# Patient Record
Sex: Female | Born: 1935 | Race: White | Hispanic: No | Marital: Married | Smoking: Former smoker
Health system: Southern US, Community
[De-identification: ages and names within clinical notes are randomized; demographics above are authoritative.]

## PROBLEM LIST (undated history)

## (undated) DIAGNOSIS — K219 Gastro-esophageal reflux disease without esophagitis: Secondary | ICD-10-CM

## (undated) DIAGNOSIS — I6529 Occlusion and stenosis of unspecified carotid artery: Secondary | ICD-10-CM

## (undated) DIAGNOSIS — F329 Major depressive disorder, single episode, unspecified: Secondary | ICD-10-CM

## (undated) DIAGNOSIS — E785 Hyperlipidemia, unspecified: Secondary | ICD-10-CM

## (undated) DIAGNOSIS — K635 Polyp of colon: Secondary | ICD-10-CM

## (undated) DIAGNOSIS — R630 Anorexia: Secondary | ICD-10-CM

## (undated) DIAGNOSIS — I7300 Raynaud's syndrome without gangrene: Secondary | ICD-10-CM

## (undated) DIAGNOSIS — F32A Depression, unspecified: Secondary | ICD-10-CM

## (undated) DIAGNOSIS — I10 Essential (primary) hypertension: Secondary | ICD-10-CM

## (undated) DIAGNOSIS — K2970 Gastritis, unspecified, without bleeding: Secondary | ICD-10-CM

## (undated) DIAGNOSIS — G609 Hereditary and idiopathic neuropathy, unspecified: Secondary | ICD-10-CM

## (undated) DIAGNOSIS — K644 Residual hemorrhoidal skin tags: Secondary | ICD-10-CM

## (undated) DIAGNOSIS — I341 Nonrheumatic mitral (valve) prolapse: Secondary | ICD-10-CM

## (undated) DIAGNOSIS — F419 Anxiety disorder, unspecified: Secondary | ICD-10-CM

## (undated) DIAGNOSIS — K5730 Diverticulosis of large intestine without perforation or abscess without bleeding: Secondary | ICD-10-CM

## (undated) DIAGNOSIS — J449 Chronic obstructive pulmonary disease, unspecified: Secondary | ICD-10-CM

## (undated) DIAGNOSIS — R002 Palpitations: Secondary | ICD-10-CM

## (undated) HISTORY — DX: Raynaud's syndrome without gangrene: I73.00

## (undated) HISTORY — DX: Anxiety disorder, unspecified: F41.9

## (undated) HISTORY — DX: Hyperlipidemia, unspecified: E78.5

## (undated) HISTORY — DX: Diverticulosis of large intestine without perforation or abscess without bleeding: K57.30

## (undated) HISTORY — DX: Chronic obstructive pulmonary disease, unspecified: J44.9

## (undated) HISTORY — DX: Occlusion and stenosis of unspecified carotid artery: I65.29

## (undated) HISTORY — DX: Gastro-esophageal reflux disease without esophagitis: K21.9

## (undated) HISTORY — DX: Polyp of colon: K63.5

## (undated) HISTORY — PX: CHOLECYSTECTOMY: SHX55

## (undated) HISTORY — DX: Nonrheumatic mitral (valve) prolapse: I34.1

## (undated) HISTORY — DX: Palpitations: R00.2

## (undated) HISTORY — DX: Major depressive disorder, single episode, unspecified: F32.9

## (undated) HISTORY — DX: Residual hemorrhoidal skin tags: K64.4

## (undated) HISTORY — DX: Hereditary and idiopathic neuropathy, unspecified: G60.9

## (undated) HISTORY — DX: Depression, unspecified: F32.A

## (undated) HISTORY — DX: Essential (primary) hypertension: I10

## (undated) HISTORY — DX: Gastritis, unspecified, without bleeding: K29.70

---

## 1997-12-20 ENCOUNTER — Other Ambulatory Visit: Admission: RE | Admit: 1997-12-20 | Discharge: 1997-12-20 | Payer: Self-pay | Admitting: Cardiology

## 1998-01-14 ENCOUNTER — Other Ambulatory Visit: Admission: RE | Admit: 1998-01-14 | Discharge: 1998-01-14 | Payer: Self-pay | Admitting: *Deleted

## 1998-04-16 DIAGNOSIS — D126 Benign neoplasm of colon, unspecified: Secondary | ICD-10-CM | POA: Insufficient documentation

## 1999-01-31 ENCOUNTER — Other Ambulatory Visit: Admission: RE | Admit: 1999-01-31 | Discharge: 1999-01-31 | Payer: Self-pay | Admitting: *Deleted

## 1999-12-31 ENCOUNTER — Emergency Department (HOSPITAL_COMMUNITY): Admission: EM | Admit: 1999-12-31 | Discharge: 1999-12-31 | Payer: Self-pay | Admitting: Emergency Medicine

## 2000-02-09 ENCOUNTER — Other Ambulatory Visit: Admission: RE | Admit: 2000-02-09 | Discharge: 2000-02-09 | Payer: Self-pay | Admitting: *Deleted

## 2000-03-12 ENCOUNTER — Other Ambulatory Visit: Admission: RE | Admit: 2000-03-12 | Discharge: 2000-03-12 | Payer: Self-pay | Admitting: *Deleted

## 2001-02-03 ENCOUNTER — Encounter (INDEPENDENT_AMBULATORY_CARE_PROVIDER_SITE_OTHER): Payer: Self-pay | Admitting: Gastroenterology

## 2001-03-11 ENCOUNTER — Other Ambulatory Visit: Admission: RE | Admit: 2001-03-11 | Discharge: 2001-03-11 | Payer: Self-pay | Admitting: *Deleted

## 2002-10-13 ENCOUNTER — Encounter: Payer: Self-pay | Admitting: Emergency Medicine

## 2002-10-13 ENCOUNTER — Emergency Department (HOSPITAL_COMMUNITY): Admission: EM | Admit: 2002-10-13 | Discharge: 2002-10-13 | Payer: Self-pay | Admitting: Emergency Medicine

## 2002-11-30 ENCOUNTER — Encounter: Admission: RE | Admit: 2002-11-30 | Discharge: 2003-02-28 | Payer: Self-pay | Admitting: Cardiology

## 2003-05-08 ENCOUNTER — Other Ambulatory Visit: Admission: RE | Admit: 2003-05-08 | Discharge: 2003-05-08 | Payer: Self-pay | Admitting: Obstetrics and Gynecology

## 2004-04-08 ENCOUNTER — Encounter (INDEPENDENT_AMBULATORY_CARE_PROVIDER_SITE_OTHER): Payer: Self-pay | Admitting: Gastroenterology

## 2004-04-08 DIAGNOSIS — K644 Residual hemorrhoidal skin tags: Secondary | ICD-10-CM | POA: Insufficient documentation

## 2004-04-08 DIAGNOSIS — K5730 Diverticulosis of large intestine without perforation or abscess without bleeding: Secondary | ICD-10-CM | POA: Insufficient documentation

## 2005-01-21 ENCOUNTER — Ambulatory Visit: Payer: Self-pay | Admitting: Gastroenterology

## 2005-09-10 ENCOUNTER — Emergency Department (HOSPITAL_COMMUNITY): Admission: EM | Admit: 2005-09-10 | Discharge: 2005-09-10 | Payer: Self-pay | Admitting: Emergency Medicine

## 2006-03-28 ENCOUNTER — Emergency Department (HOSPITAL_COMMUNITY): Admission: EM | Admit: 2006-03-28 | Discharge: 2006-03-28 | Payer: Self-pay | Admitting: *Deleted

## 2006-05-22 ENCOUNTER — Emergency Department (HOSPITAL_COMMUNITY): Admission: EM | Admit: 2006-05-22 | Discharge: 2006-05-22 | Payer: Self-pay | Admitting: Emergency Medicine

## 2007-07-06 ENCOUNTER — Emergency Department (HOSPITAL_COMMUNITY): Admission: EM | Admit: 2007-07-06 | Discharge: 2007-07-06 | Payer: Self-pay | Admitting: Emergency Medicine

## 2007-07-13 ENCOUNTER — Ambulatory Visit: Payer: Self-pay | Admitting: Gastroenterology

## 2007-07-13 LAB — CONVERTED CEMR LAB
BUN: 5 mg/dL — ABNORMAL LOW (ref 6–23)
CO2: 29 meq/L (ref 19–32)
Calcium: 9.9 mg/dL (ref 8.4–10.5)
GFR calc Af Amer: 154 mL/min
GFR calc non Af Amer: 127 mL/min
Glucose, Bld: 96 mg/dL (ref 70–99)
Sodium: 130 meq/L — ABNORMAL LOW (ref 135–145)
TSH: 1.6 microintl units/mL (ref 0.35–5.50)

## 2007-08-03 ENCOUNTER — Ambulatory Visit: Payer: Self-pay | Admitting: Gastroenterology

## 2007-08-03 LAB — CONVERTED CEMR LAB: Potassium: 4.5 meq/L (ref 3.5–5.1)

## 2007-11-10 DIAGNOSIS — J4489 Other specified chronic obstructive pulmonary disease: Secondary | ICD-10-CM | POA: Insufficient documentation

## 2007-11-10 DIAGNOSIS — I10 Essential (primary) hypertension: Secondary | ICD-10-CM

## 2007-11-10 DIAGNOSIS — F329 Major depressive disorder, single episode, unspecified: Secondary | ICD-10-CM

## 2007-11-10 DIAGNOSIS — F411 Generalized anxiety disorder: Secondary | ICD-10-CM | POA: Insufficient documentation

## 2007-11-10 DIAGNOSIS — E785 Hyperlipidemia, unspecified: Secondary | ICD-10-CM

## 2007-11-10 DIAGNOSIS — J449 Chronic obstructive pulmonary disease, unspecified: Secondary | ICD-10-CM

## 2007-11-10 DIAGNOSIS — K2970 Gastritis, unspecified, without bleeding: Secondary | ICD-10-CM | POA: Insufficient documentation

## 2007-11-10 DIAGNOSIS — K219 Gastro-esophageal reflux disease without esophagitis: Secondary | ICD-10-CM | POA: Insufficient documentation

## 2007-11-10 DIAGNOSIS — K2990 Gastroduodenitis, unspecified, without bleeding: Secondary | ICD-10-CM

## 2008-07-12 ENCOUNTER — Ambulatory Visit: Payer: Self-pay | Admitting: Gastroenterology

## 2008-07-12 DIAGNOSIS — R1084 Generalized abdominal pain: Secondary | ICD-10-CM | POA: Insufficient documentation

## 2008-07-12 LAB — CONVERTED CEMR LAB
Alkaline Phosphatase: 78 units/L (ref 39–117)
Basophils Absolute: 0.1 10*3/uL (ref 0.0–0.1)
Basophils Relative: 1 % (ref 0.0–3.0)
Eosinophils Relative: 1.8 % (ref 0.0–5.0)
Ferritin: 146.5 ng/mL (ref 10.0–291.0)
Folate: >20 ng/mL
GFR calc non Af Amer: 86 mL/min
Glucose, Bld: 115 mg/dL — ABNORMAL HIGH (ref 70–99)
Iron: 87 ug/dL (ref 42–145)
MCHC: 34.4 g/dL (ref 30.0–36.0)
Monocytes Relative: 12.4 % — ABNORMAL HIGH (ref 3.0–12.0)
Neutro Abs: 2.8 10*3/uL (ref 1.4–7.7)
Neutrophils Relative %: 54.8 % (ref 43.0–77.0)
Platelets: 250 10*3/uL (ref 150–400)
Potassium: 4.4 meq/L (ref 3.5–5.1)
Saturation Ratios: 24.5 % (ref 20.0–50.0)
TSH: 0.92 microintl units/mL (ref 0.35–5.50)
Total Protein, Serum Electrophoresis: 7.4 g/dL (ref 6.0–8.3)
Total Protein: 7.1 g/dL (ref 6.0–8.3)
Transferrin: 253.9 mg/dL (ref >=212.0)

## 2008-07-13 ENCOUNTER — Encounter: Payer: Self-pay | Admitting: Gastroenterology

## 2008-07-31 ENCOUNTER — Ambulatory Visit: Payer: Self-pay | Admitting: Gastroenterology

## 2010-05-19 ENCOUNTER — Ambulatory Visit: Payer: Self-pay | Admitting: Cardiology

## 2010-05-22 ENCOUNTER — Ambulatory Visit: Payer: Self-pay | Admitting: Cardiology

## 2010-05-22 ENCOUNTER — Ambulatory Visit (HOSPITAL_COMMUNITY): Admission: RE | Admit: 2010-05-22 | Discharge: 2010-05-22 | Payer: Self-pay | Admitting: Cardiology

## 2010-05-22 ENCOUNTER — Encounter: Payer: Self-pay | Admitting: Cardiovascular Disease

## 2010-05-22 ENCOUNTER — Ambulatory Visit: Payer: Self-pay

## 2010-05-22 DIAGNOSIS — H349 Unspecified retinal vascular occlusion: Secondary | ICD-10-CM | POA: Insufficient documentation

## 2010-07-10 ENCOUNTER — Ambulatory Visit: Payer: Self-pay | Admitting: Cardiology

## 2010-09-09 ENCOUNTER — Ambulatory Visit: Payer: Self-pay | Admitting: Cardiology

## 2010-10-21 NOTE — Miscellaneous (Signed)
Summary: Orders Update  Clinical Lists Changes  Problems: Added new problem of UNSPECIFIED RETINAL VASCULAR OCCLUSION (ICD-362.30) Orders: Added new Test order of Carotid Duplex (Carotid Duplex) - Signed

## 2010-12-01 ENCOUNTER — Ambulatory Visit (INDEPENDENT_AMBULATORY_CARE_PROVIDER_SITE_OTHER): Payer: 59 | Admitting: Cardiology

## 2010-12-01 DIAGNOSIS — Z79899 Other long term (current) drug therapy: Secondary | ICD-10-CM

## 2010-12-01 DIAGNOSIS — E7800 Pure hypercholesterolemia, unspecified: Secondary | ICD-10-CM

## 2010-12-01 DIAGNOSIS — I059 Rheumatic mitral valve disease, unspecified: Secondary | ICD-10-CM

## 2010-12-16 ENCOUNTER — Other Ambulatory Visit: Payer: Self-pay | Admitting: Cardiology

## 2010-12-16 DIAGNOSIS — J302 Other seasonal allergic rhinitis: Secondary | ICD-10-CM

## 2010-12-18 ENCOUNTER — Other Ambulatory Visit: Payer: Self-pay | Admitting: Cardiology

## 2010-12-18 DIAGNOSIS — F329 Major depressive disorder, single episode, unspecified: Secondary | ICD-10-CM

## 2010-12-18 MED ORDER — ESCITALOPRAM OXALATE 10 MG PO TABS: 10.0000 mg | ORAL_TABLET | Freq: Every day | ORAL | Status: AC

## 2010-12-18 NOTE — Telephone Encounter (Signed)
escribe request  

## 2010-12-18 NOTE — Telephone Encounter (Signed)
Patient called requesting refill

## 2010-12-18 NOTE — Telephone Encounter (Signed)
[facility] °

## 2010-12-18 NOTE — Telephone Encounter (Signed)
NEEDS REFILL ON LEXAPRO - CALL INTO [facility]- ONLY HAS 3 LEFT

## 2011-02-03 NOTE — Assessment & Plan Note (Signed)
[facility]                         GASTROENTEROLOGY OFFICE NOTE   KIERSTON, PLASENCIA                     MRN:          [id number]  DATE:08/03/2007                            DOB:          [birth year]    ADDRESS:  Cassell Clement, M.D.  [address], [address]  [place], [place] [address]   BODY:   ADDENDUM:  Ms. Stryker's electrolytes were abnormal with what appeared to  be a dilutional hyponatremia. She also was hypokalemic and is on  potassium replacement therapy. Her hydrochlorothiazide 12.5 a day has  been changed to every other day. I have sent her by the lab to repeat  her electrolytes. I have also asked her quit drinking large volumes of  water a day and to try to mix this up with salt-balanced solutions. I  suspect a lot of her symptoms may be related to electrolyte abnormality.  Depending on electrolyte results, we will counsel her further along  these lines.     Vania Rea. Jarold Motto, MD, Caleen Essex, FAGA  Electronically Signed    DRP/MedQ  DD: 08/03/2007  DT: 08/04/2007  Job #: [id number]

## 2011-02-03 NOTE — Assessment & Plan Note (Signed)
[facility]                         GASTROENTEROLOGY OFFICE NOTE   GLENDORA, CLOUATRE                     MRN:          [id number]  DATE:07/13/2007                            DOB:          April 29, 1928    PRIMARY PHYSICIAN:  Cassell Clement, M.D.   HISTORY:  Jenny Torres is a pleasant 77 year old white female, known in the  past to Dr. Ulyess Mort.  She was last seen in May 2006.  She does  have a history of GERD.  She is status post cholecystectomy and has had  alkaline gastritis.  Colonoscopy was done in July 2005.  She had left  sided diverticular disease and external hemorrhoids and last EGD was  done in 2002, which showed the alkaline gastritis.  At this time,  [place] has been suffering with depression which is in part related to  her husband's placement in a nursing home.  She had developed a loss of  appetite, was having difficulty eating and was seen by Dr. Patty Sermons and  started on Lexapro about 5 weeks ago.  She does feel that the Lexapro is  helping her depressive symptoms and her appetite has improved somewhat.  She had lost about 7 pounds but feels that she has leveled off at this  point.  Around that same time, she developed difficulty with nausea  which she says has been intermittent and comes very unexpectedly at all  different times of the day.  She says it comes on fairly suddenly and  sometimes it is just nausea, other times with dry heaves or vomiting.  On occasion she will have a prolonged episode of vomiting.  She did have an ER visit on October 15th, because of several bouts of  nausea and vomiting.  Workup there included a CT of the abdomen and  pelvis which showed the sigmoid diverticular disease, otherwise negative  abdomen.  On chest x-ray she was noted to have a possible nodularity in  the right lung base and recommended 2-view chest.  Labs were  unremarkable save a sodium level of 128 and potassium of 3.2.  She was  discharged with Phenergan suppositories to use p.r.n.  Since that time she did have one other episode of nausea but says that  over the past couple of days she has been feeling better.  She does  describe an increase in indigestion with belching and gassiness, has not  had any real heartburn, no dysphagia, no fever or chills.  Her bowel  movements have been fairly normal without melena or hematochezia.  She  is not on any regular aspirin or NSAIDs.   CURRENT MEDICATIONS:  1. Toprol XL 50 daily.  2. HCTZ 12.5 every other day.  3. Gabapentin 800 b.i.d.  4. Folic acid daily.  5. Multivitamin daily.  6. Os-Cal plus D 500 two p.o. daily.  7. Boniva 150 daily.  8. Ambien 12.5 q.h.s.  9. Lorazepam 1 mg, one half tablet p.r.n.  10.Lexapro 10 daily.   ALLERGIES:  SULFA DRUGS.   PAST HISTORY:  1. Status post cholecystectomy.  2. C-section x2.  3. She has a  history of hypertension.  4. COPD.  5. Hyperlipidemia.  6. Anxiety and depression.   FAMILY HISTORY:  Pertinent for breast cancer in her mother.  One brother  with liver disease.   SOCIAL HISTORY:  The patient is married, currently living alone.  Her  husband is in a nursing home.  She is retired.  She is a nonsmoker,  occasional social alcohol.   REVIEW OF SYSTEMS:  Pertinent for insomnia and depressive symptoms as  outlined above.  Otherwise completely negative.   PHYSICAL EXAMINATION:  GENERAL:  A well developed, thin, white female in  no acute distress.  Alert and oriented.  Pleasant.  VITAL SIGNS:  Height is 5 feet 4 inches, weight is 99.2, blood pressure  130/80, pulse is 62.  HEENT:  Nontraumatic, normocephalic.  EOMI.  PERLA.  Sclerae anicteric.  NECK:  Supple.  CARDIOVASCULAR:  Regular rate and rhythm with S1 and S2.  No murmur,  rub, or gallop.  PULMONARY:  Clear to A&P.  ABDOMEN:  Soft and nontender.  There is no palpable mass or  hepatosplenomegaly.  RECTAL:  Hemoccult negative and without mass.    IMPRESSION:  73. A 77 year old white female with a 5-week history of intermittent      nausea, suspect that this may be a side effect of the Lexapro as      temporally it started around the same time she began the Lexapro,      rule out alkaline gastritis, and non-ulcer dyspepsia.  2. Weight loss, anorexia felt secondary to depression, currently      improved on Lexapro.  3. Mild hyponatremia and hypokalemia.   PLAN:  1. Check B-MET today for follow up the hypokalemia and hyponatremia      and ensure no further decline.  2. Trial of Nexium 40 mg p.o. q.a.m.  She was given samples and a      prescription today.  3. Institute a bland diet with small frequent feedings.  4. Trial of Zofran oral disintegrating tablet, 4 mg q.6 h. p.r.n.      nausea.  5. Follow up with Dr. Jarold Motto in 2-3 weeks.  If she is not improved      at that point will likely need EGD.      Mike Gip, PA-C  Electronically Signed      Barbette Hair. Arlyce Dice, MD,FACG  Electronically Signed   AE/MedQ  DD: 07/13/2007  DT: 07/14/2007  Job #: [id number]

## 2011-02-03 NOTE — Assessment & Plan Note (Signed)
[facility]                         GASTROENTEROLOGY OFFICE NOTE   Jenny Torres, Jenny Torres                     MRN:          [id number]  DATE:08/03/2007                            DOB:          January 09, 1928    The patient returns for follow-up of office visit July 13, 2007, when  she saw Mike Gip, PA-C for evaluation of nausea.  She was  empirically given a trial of Nexium 40 mg a day which made her symptoms  worse.  Since stopping her Lexapro she is entirely asymptomatic and  denies any GI complaints whatsoever.  Her appetite is improved, but she  still lost another 7-8 pounds of weight which is of some concern.   She has her primary care through Cassell Clement, M.D. and I have not  given her another antidepressant since I do not know the patient as well  as Dr. Patty Sermons.  If she continues to do poorly in terms of her  clinical course, psychiatric referral would certainly seem in order.  She has had a rather thorough previous GI workup by Ulyess Mort,  M.D. which was unremarkable.   ADDENDUM:  [name]'s electrolytes were abnormal with what appeared to  be a dilutional hyponatremia. She also was hypokalemic and is on  potassium replacement therapy. Her hydrochlorothiazide 12.5 a day has  been changed to every other day. I have sent her by the lab to repeat  her electrolytes. I have also asked her quit drinking large volumes of  water a day and to try to mix this up with salt-balanced solutions. I  suspect a lot of her symptoms may be related to electrolyte abnormality.  Depending on electrolyte results, we will counsel her further along  these lines.     Jenny Rea. Jarold Motto, MD, Caleen Essex, FAGA  Electronically Signed    DRP/MedQ  DD: 08/03/2007  DT: 08/04/2007  Job #: [id number]   cc:   Cassell Clement, M.D.

## 2011-03-19 ENCOUNTER — Encounter: Payer: Self-pay | Admitting: Podiatry

## 2011-03-23 ENCOUNTER — Encounter: Payer: Self-pay | Admitting: Cardiology

## 2011-03-27 ENCOUNTER — Telehealth: Payer: Self-pay | Admitting: Cardiology

## 2011-03-27 NOTE — Telephone Encounter (Signed)
Patient c/o lower back pain all week.  Denies any urinary symptoms.  Doesn't think it feels like a bone issue or that she injured it.  Advised  Dr. Patty Sermons out of the office today and to go to urgent care.  Patient states does use ibuprofen at bedtime only.  Advised to go ahead and to take some now and to try to get to urgent care today.  Verbalized understanding

## 2011-03-27 NOTE — Telephone Encounter (Signed)
Pt says she has been having lower back pain for about a week please call

## 2011-04-01 ENCOUNTER — Other Ambulatory Visit: Payer: Self-pay | Admitting: *Deleted

## 2011-04-01 DIAGNOSIS — E785 Hyperlipidemia, unspecified: Secondary | ICD-10-CM

## 2011-04-20 ENCOUNTER — Ambulatory Visit (INDEPENDENT_AMBULATORY_CARE_PROVIDER_SITE_OTHER): Payer: Medicare Other | Admitting: *Deleted

## 2011-04-20 ENCOUNTER — Other Ambulatory Visit: Payer: 59 | Admitting: *Deleted

## 2011-04-20 DIAGNOSIS — E785 Hyperlipidemia, unspecified: Secondary | ICD-10-CM

## 2011-04-20 LAB — BASIC METABOLIC PANEL
BUN: 7 mg/dL (ref 6–23)
CO2: 29 mEq/L (ref 19–32)
Calcium: 9 mg/dL (ref 8.4–10.5)
Chloride: 101 mEq/L (ref 96–112)
Glucose, Bld: 77 mg/dL (ref 70–99)
Potassium: 4.1 mEq/L (ref 3.5–5.1)
Sodium: 134 mEq/L — ABNORMAL LOW (ref 135–145)

## 2011-04-20 LAB — HEPATIC FUNCTION PANEL
Albumin: 4.3 g/dL (ref 3.5–5.2)
Alkaline Phosphatase: 81 U/L (ref 39–117)
Total Protein: 6.8 g/dL (ref 6.0–8.3)

## 2011-04-20 LAB — LIPID PANEL
Cholesterol: 183 mg/dL (ref 0–200)
Triglycerides: 64 mg/dL (ref 0.0–149.0)
VLDL: 12.8 mg/dL (ref 0.0–40.0)

## 2011-04-24 ENCOUNTER — Ambulatory Visit (INDEPENDENT_AMBULATORY_CARE_PROVIDER_SITE_OTHER): Payer: Medicare Other | Admitting: Cardiology

## 2011-04-24 ENCOUNTER — Encounter: Payer: Self-pay | Admitting: Cardiology

## 2011-04-24 ENCOUNTER — Other Ambulatory Visit: Payer: Self-pay | Admitting: *Deleted

## 2011-04-24 VITALS — BP 130/85 | HR 80 | Wt 98.0 lb

## 2011-04-24 DIAGNOSIS — I7300 Raynaud's syndrome without gangrene: Secondary | ICD-10-CM

## 2011-04-24 DIAGNOSIS — I2510 Atherosclerotic heart disease of native coronary artery without angina pectoris: Secondary | ICD-10-CM

## 2011-04-24 DIAGNOSIS — R002 Palpitations: Secondary | ICD-10-CM

## 2011-04-24 DIAGNOSIS — E785 Hyperlipidemia, unspecified: Secondary | ICD-10-CM

## 2011-04-24 DIAGNOSIS — I10 Essential (primary) hypertension: Secondary | ICD-10-CM

## 2011-04-24 DIAGNOSIS — F329 Major depressive disorder, single episode, unspecified: Secondary | ICD-10-CM

## 2011-04-24 MED ORDER — AMLODIPINE BESYLATE 5 MG PO TABS: 5.0000 mg | ORAL_TABLET | Freq: Every day | ORAL | Status: DC

## 2011-04-24 MED ORDER — FOLIC ACID 1 MG PO TABS: 1.0000 mg | ORAL_TABLET | Freq: Every day | ORAL | Status: DC

## 2011-04-24 MED ORDER — METOPROLOL TARTRATE 25 MG PO TABS: 25.0000 mg | ORAL_TABLET | Freq: Every day | ORAL | Status: DC

## 2011-04-24 NOTE — Telephone Encounter (Signed)
Refilled folic acid to [facility]

## 2011-04-24 NOTE — Progress Notes (Signed)
Jenny Torres Date of Birth:  [birth year] [facility] [address]   [address] [place], [place]  [address] [phone]           Fax   [phone]  History of Present Illness: This pleasant 77 year old woman is seen for a scheduled followup office visit.  She has a complex past medical history.  She has a history of mitral valve prolapse and history of palpitations.  She also has a history of seasonal affective disorder and depression and is on Lexapro.  She does not have any history of ischemic heart disease.  He has had some atypical chest pain but she had a normal nuclear stress test on 11/25/07 which showed no ischemia and an ejection fraction of 73%.  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  She is on daily aspirin.  She has a history of hypercholesterolemia and is intolerant of statins.  Recently we tried again putting her on Zetia and this time she is able to take it okay.  Her lipids this time are significantly improved since we last saw her the patient has developed worsening Raynaud's phenomenon of her hands and her feet.  It is particularly troublesome when she goes to the grocery store and is exposed to cold air.  The patient has been under a lot of stress.  Her husband has  advanced dementiaAnd does not recognize her or the children anymore.  Her husband has a pacemaker but is not pacemaker dependent and I have supported the patient's feeling that the pacemaker should not be replaced in his situation.  Her children however have been giving her a lot of grief about that decision.  The children however are not involved in the husband's day-to-day care either.  Current Outpatient Prescriptions  Medication Sig Dispense Refill  . aspirin 81 MG tablet Take 81 mg by mouth daily.        . calcium-vitamin D (OSCAL 500/200 D-3) 500-200  MG-UNIT per tablet Take 2 tablets by mouth daily.        . fluocinonide (LIDEX) 0.05 % cream Apply topically 2 (two) times daily. As directed by dermatologist       . fluticasone (FLONASE) 50 MCG/ACT nasal spray AS DIRECTED  16 g  5  . gabapentin (NEURONTIN) 800 MG tablet Take 800 mg by mouth 2 (two) times daily. Taking 2 or 3 times a day      . Multiple Vitamins-Minerals (MULTIVITAL) tablet Take 1 tablet by mouth daily.        . ondansetron (ZOFRAN ODT) 4 MG disintegrating tablet Take 4 mg by mouth as needed.        . Psyllium (METAMUCIL) 30.9 % POWD Take by mouth daily.        Marland Kitchen zolpidem (AMBIEN CR) 12.5 MG CR tablet Take 12.5 mg by mouth at bedtime.        Marland Kitchen amLODipine (NORVASC) 5 MG tablet Take 1 tablet (5 mg total) by mouth daily.  90 tablet  3  . folic acid (FOLVITE) 1 MG tablet Take 1 tablet (1 mg total) by mouth daily.  30 tablet  11  . metoprolol tartrate (LOPRESSOR) 25 MG tablet Take 1 tablet (25 mg total) by mouth daily.  90 tablet  3    Allergies  Allergen Reactions  . Sulfamethoxazole     REACTION: unspecified  Patient Active Problem List  Diagnoses  . POLYP, COLON  . HYPERLIPIDEMIA  . ANXIETY  . DEPRESSION  . UNSPECIFIED RETINAL VASCULAR OCCLUSION  . HYPERTENSION  . EXTERNAL HEMORRHOIDS  . COPD  . GASTROESOPHAGEAL REFLUX DISEASE  . GASTRITIS  . DIVERTICULOSIS, COLON  . ABDOMINAL PAIN, GENERALIZED    History  Smoking status  . Never Smoker   Smokeless tobacco  . Not on file    History  Alcohol Use  . Yes    Family History  Problem Relation Age of Onset  . Breast cancer Mother   . Liver disease Brother   . Cirrhosis Brother     Review of Systems: Constitutional: no fever chills diaphoresis or fatigue or change in weight.  Head and neck: no hearing loss, no epistaxis, no photophobia or visual disturbance. Respiratory: No cough, shortness of breath or wheezing. Cardiovascular: No chest pain peripheral edema, palpitations. Gastrointestinal: No  abdominal distention, no abdominal pain, no change in bowel habits hematochezia or melena. Genitourinary: No dysuria, no frequency, no urgency, no nocturia. Musculoskeletal:No arthralgias, no back pain, no gait disturbance or myalgias. Neurological: No dizziness, no headaches, no numbness, no seizures, no syncope, no weakness, no tremors.  She does have severe peripheral neuropathy of her lower extremities secondary to previous statin therapy Hematologic: No lymphadenopathy, no easy bruising. Psychiatric: No confusion, no hallucinations, no sleep disturbance.    Physical Exam: Filed Vitals:   04/24/11 1602  BP: 130/85  Pulse: 80   the general appearance reveals a thin elderly woman in no acute distress.The pupils are equal round reactive to light and accommodation.  There is no lymphadenopathy.  The thyroid is not enlarged.  There are no carotid bruits.The chest is clear to percussion and auscultation.  The heart reveals no murmur gallop rub or click.  The abdomen is soft and nontender without hepatosplenomegaly or masses.  Extremities show no phlebitis or edema.  Pedal pulses are present.the skin is unremarkable      Assessment / Plan:   continue same medication except decreased metoprolol to 25 mg once a day and add amlodipine 5 mg once a day to help with Raynaud's symptoms.  Recheck in 4 months for followup office visit lipid panel and chemistries.

## 2011-04-24 NOTE — Assessment & Plan Note (Signed)
The patient is tolerating the Zetia.  Her most recent lipid numbers are quite good

## 2011-04-24 NOTE — Assessment & Plan Note (Signed)
The patient is not having any chest pain or increased palpitations.  She denies any increased dyspnea.  She's had no dizziness or syncope.

## 2011-04-24 NOTE — Assessment & Plan Note (Signed)
The patient has a history of anxiety and depression.  She is on Lexapro 20 mg and also takes an occasional half tablet of lorazepam for stress.  She has a poor appetite and lack of interest in food and she has lost another 4 pounds.  I told her that I wanted her to gain back some weight and she needs to start drinking Ensure or boost or whatever type of protein drink appeals to her most

## 2011-04-30 ENCOUNTER — Telehealth: Payer: Self-pay | Admitting: *Deleted

## 2011-04-30 DIAGNOSIS — G4700 Insomnia, unspecified: Secondary | ICD-10-CM

## 2011-04-30 NOTE — Telephone Encounter (Signed)
Patient came by stating she is under a lot of stress and having a very difficult time sleeping.  She has been taking generic Ambien at bedtime, but it is no longer working.  Discussed with  Dr. Patty Sermons and will try Temazepam 30 mg at bedtime as needed.  Advised patient and she is to call back if this does not help.  Also advised to not take the Ambien and Temazepam together.  Verbalized understanding.

## 2011-05-01 ENCOUNTER — Encounter: Payer: Self-pay | Admitting: Cardiology

## 2011-05-01 ENCOUNTER — Telehealth: Payer: Self-pay | Admitting: Cardiology

## 2011-05-01 DIAGNOSIS — M792 Neuralgia and neuritis, unspecified: Secondary | ICD-10-CM

## 2011-05-01 MED ORDER — GABAPENTIN 800 MG PO TABS: ORAL_TABLET | ORAL | Status: DC

## 2011-05-01 NOTE — Telephone Encounter (Signed)
Advised patient

## 2011-05-01 NOTE — Telephone Encounter (Signed)
Pt was here the other day and forgot to get renewal of gabapentin tab 800 mg 3 a day please call

## 2011-05-05 MED ORDER — TEMAZEPAM 30 MG PO CAPS: 30.0000 mg | ORAL_CAPSULE | Freq: Every evening | ORAL | Status: AC | PRN

## 2011-05-28 ENCOUNTER — Telehealth: Payer: Self-pay | Admitting: Cardiology

## 2011-05-28 DIAGNOSIS — R002 Palpitations: Secondary | ICD-10-CM

## 2011-05-28 DIAGNOSIS — I7300 Raynaud's syndrome without gangrene: Secondary | ICD-10-CM

## 2011-05-28 DIAGNOSIS — G4700 Insomnia, unspecified: Secondary | ICD-10-CM

## 2011-05-28 NOTE — Telephone Encounter (Signed)
Patient phoned stating that  Dr. Patty Sermons had changed her metoprolol to one daily and added amlodipine secondary to her Raynauld's symptoms.  She has not noticed a difference and had a strange taste in her mouth.  She change back to her metoprolol twice daily and discontinued her amlodipine. Advised ok to continue her previous regimen.  Does not want to make changes there.  She also is still having sleep issues.  States the temazepam 30 mg at bedtime helps her sleep however, she feels very tired the next day.  Does not even feel comfortable driving.  The ambien 10 mg helps, but for only  4 hours.  Temazepam is a capsule so she is not able to cut in half.  Please advise

## 2011-05-28 NOTE — Telephone Encounter (Signed)
Pt calling regarding medication side effects and desire to modify RX's and/or dosages.

## 2011-05-30 NOTE — Telephone Encounter (Signed)
Try reducing temazepam to 15 mg at bedtime to lessen the side effects of sleepiness the next day

## 2011-06-01 MED ORDER — TEMAZEPAM 15 MG PO CAPS: 15.0000 mg | ORAL_CAPSULE | Freq: Every evening | ORAL | Status: AC | PRN

## 2011-06-01 NOTE — Telephone Encounter (Signed)
Left message

## 2011-06-01 NOTE — Telephone Encounter (Signed)
Agree with plan 

## 2011-06-01 NOTE — Telephone Encounter (Signed)
Advised of decrease temazepam to  15 mg hs, called to [facility], only going to start with 1 week supply first

## 2011-07-01 LAB — I-STAT 8, (EC8 V) (CONVERTED LAB)
Bicarbonate: 21.3
Operator id: 279831
Potassium: 3.2 — ABNORMAL LOW
TCO2: 22
pCO2, Ven: 32.7 — ABNORMAL LOW
pH, Ven: 7.422 — ABNORMAL HIGH

## 2011-07-01 LAB — DIFFERENTIAL
Basophils Absolute: 0
Basophils Relative: 0
Eosinophils Absolute: 0
Lymphocytes Relative: 24
Neutro Abs: 4.4
Neutrophils Relative %: 66

## 2011-07-01 LAB — CBC
MCHC: 35
Platelets: 258
RDW: 12.8

## 2011-07-07 ENCOUNTER — Telehealth: Payer: Self-pay | Admitting: Cardiology

## 2011-07-07 NOTE — Telephone Encounter (Signed)
Patient called about refill request from [facility] on her generic ambien.  She does get more rest with the temazepam 30 mg however it makes her feel very tired the next day.  Does have the 15 mg they she was given to see if this would help without the fatigue the next day.  The generic Remus Loffler she only gets about 4 hours of sleep a night.  Is going to try the temazepam 15 mg for a few nights and let us know which one works better.  Will call us back and let us know what to refill

## 2011-07-07 NOTE — Telephone Encounter (Signed)
Pt called wanted to talk to you about a prescription please call after 4pm

## 2011-07-07 NOTE — Telephone Encounter (Signed)
Agree with plan 

## 2011-07-08 ENCOUNTER — Telehealth: Payer: Self-pay | Admitting: Cardiology

## 2011-07-08 NOTE — Telephone Encounter (Signed)
Pt needs refill of Ambiem and they have faxed this several times with no reply.  Medicine not listed on med list so could not be sent to refill desk.

## 2011-07-08 NOTE — Telephone Encounter (Signed)
Spoke with Mickeal Skinner and explained Ambien only helps for about 4 hours, so she is going to try Temazepam 15 mg.  She will call back and let us know which one she prefers.  Will call Leonie Douglas back with a refill ok

## 2011-07-10 ENCOUNTER — Telehealth: Payer: Self-pay | Admitting: Cardiology

## 2011-07-10 NOTE — Telephone Encounter (Signed)
Pt calling to speak with [facility] regarding pt dosages. Please return pt call to discuss further.

## 2011-07-10 NOTE — Telephone Encounter (Signed)
Pt states she thinks the Temazepam is working ok and would like to try it for another month.

## 2011-07-22 ENCOUNTER — Telehealth: Payer: Self-pay | Admitting: Cardiology

## 2011-07-22 DIAGNOSIS — G4700 Insomnia, unspecified: Secondary | ICD-10-CM

## 2011-07-22 NOTE — Telephone Encounter (Signed)
We can try the ambien CR 12.5 qHS

## 2011-07-22 NOTE — Telephone Encounter (Signed)
Has been trying the Restoril 15 mg since 30 mg made her kind of groggy next day.  Restoril 15 mg didn't work very well and would like to go back to Ambien 10 mg.  States with Ambien 10 mg she only sleeps for 4 hours.  States she has never tried the Ambien CR, do you think this would be good for her to try? If so will call to [facility]

## 2011-07-22 NOTE — Telephone Encounter (Signed)
Pt needs to talk to you about medication she talk to you about before

## 2011-07-24 MED ORDER — METOPROLOL TARTRATE 25 MG PO TABS: 25.0000 mg | ORAL_TABLET | Freq: Two times a day (BID) | ORAL | Status: DC

## 2011-07-24 NOTE — Telephone Encounter (Signed)
Advised patient

## 2011-08-07 NOTE — Telephone Encounter (Signed)
New problem:  Patient start on new medication - zolpidem 12.5 mg. Working fine. 90 day supply called in to [facility].

## 2011-08-10 NOTE — Telephone Encounter (Signed)
Will fax signed order to [facility]

## 2011-08-19 MED ORDER — ZOLPIDEM TARTRATE ER 12.5 MG PO TBCR: 12.5000 mg | EXTENDED_RELEASE_TABLET | Freq: Every day | ORAL | Status: DC

## 2011-09-09 ENCOUNTER — Other Ambulatory Visit: Payer: Self-pay | Admitting: *Deleted

## 2011-09-09 ENCOUNTER — Encounter: Payer: Self-pay | Admitting: *Deleted

## 2011-09-09 ENCOUNTER — Other Ambulatory Visit (INDEPENDENT_AMBULATORY_CARE_PROVIDER_SITE_OTHER): Payer: 59 | Admitting: *Deleted

## 2011-09-09 DIAGNOSIS — G609 Hereditary and idiopathic neuropathy, unspecified: Secondary | ICD-10-CM | POA: Insufficient documentation

## 2011-09-09 DIAGNOSIS — E785 Hyperlipidemia, unspecified: Secondary | ICD-10-CM

## 2011-09-09 DIAGNOSIS — I341 Nonrheumatic mitral (valve) prolapse: Secondary | ICD-10-CM | POA: Insufficient documentation

## 2011-09-09 DIAGNOSIS — R002 Palpitations: Secondary | ICD-10-CM | POA: Insufficient documentation

## 2011-09-09 DIAGNOSIS — M810 Age-related osteoporosis without current pathological fracture: Secondary | ICD-10-CM | POA: Insufficient documentation

## 2011-09-09 LAB — BASIC METABOLIC PANEL
CO2: 29 mEq/L (ref 19–32)
Chloride: 100 mEq/L (ref 96–112)
Potassium: 3.9 mEq/L (ref 3.5–5.1)

## 2011-09-09 LAB — LIPID PANEL
Cholesterol: 198 mg/dL (ref 0–200)
LDL Cholesterol: 97 mg/dL (ref 0–99)
Total CHOL/HDL Ratio: 2
Triglycerides: 66 mg/dL (ref 0.0–149.0)
VLDL: 13.2 mg/dL (ref 0.0–40.0)

## 2011-09-09 LAB — HEPATIC FUNCTION PANEL
Albumin: 4.1 g/dL (ref 3.5–5.2)
Alkaline Phosphatase: 94 U/L (ref 39–117)
Total Protein: 6.9 g/dL (ref 6.0–8.3)

## 2011-09-11 ENCOUNTER — Ambulatory Visit (INDEPENDENT_AMBULATORY_CARE_PROVIDER_SITE_OTHER): Payer: Medicare Other | Admitting: Cardiology

## 2011-09-11 ENCOUNTER — Encounter: Payer: Self-pay | Admitting: Cardiology

## 2011-09-11 DIAGNOSIS — F411 Generalized anxiety disorder: Secondary | ICD-10-CM

## 2011-09-11 DIAGNOSIS — E7800 Pure hypercholesterolemia, unspecified: Secondary | ICD-10-CM

## 2011-09-11 DIAGNOSIS — G609 Hereditary and idiopathic neuropathy, unspecified: Secondary | ICD-10-CM

## 2011-09-11 DIAGNOSIS — I341 Nonrheumatic mitral (valve) prolapse: Secondary | ICD-10-CM

## 2011-09-11 DIAGNOSIS — I059 Rheumatic mitral valve disease, unspecified: Secondary | ICD-10-CM

## 2011-09-11 DIAGNOSIS — G629 Polyneuropathy, unspecified: Secondary | ICD-10-CM

## 2011-09-11 DIAGNOSIS — I7300 Raynaud's syndrome without gangrene: Secondary | ICD-10-CM

## 2011-09-11 DIAGNOSIS — E785 Hyperlipidemia, unspecified: Secondary | ICD-10-CM

## 2011-09-11 NOTE — Assessment & Plan Note (Signed)
The patient has had problems with unintentional weight loss.  For this reason we've had to be more liberal with her heart healthy diet to encourage weight gain.  She has gained 2 pounds since last visit.  Despite the weight gain and the more liberal diet her lipids remain satisfactory.  She is on Zetia which she is tolerating.

## 2011-09-11 NOTE — Assessment & Plan Note (Signed)
The patient has not been experiencing any recent palpitations.  Denies any chest pain or angina.  She has had no recent nausea.

## 2011-09-11 NOTE — Progress Notes (Signed)
Jenny Torres Date of Birth:  09/06/1928 [facility] / [facility] [address]   [address] [place], [place]  [address] [phone]           Fax   [phone]  History of Present Illness: This pleasant 77 year old female is seen for a scheduled followup office visit.  She has a past history of hypercholesterolemia and history of essential hypertension.  She also has a history of Raynaud's disease.  She's had a history of palpitations and history of situational depression.  She has a history of suspected mitral valve prolapse.  His last visit she's been doing for her.  She's had no new cardiac symptoms.  She is under a lot of ongoing stress because of her husband who has severe dementia and is in a nursing home.  Current Outpatient Prescriptions  Medication Sig Dispense Refill  . aspirin 81 MG tablet Take 81 mg by mouth daily.        . calcium-vitamin D (OSCAL 500/200 D-3) 500-200 MG-UNIT per tablet Take 2 tablets by mouth daily.        Marland Kitchen ezetimibe (ZETIA) 10 MG tablet Take 10 mg by mouth daily.        . fluocinonide (LIDEX) 0.05 % cream Apply topically 2 (two) times daily. As directed by dermatologist       . fluticasone (FLONASE) 50 MCG/ACT nasal spray AS DIRECTED  16 g  5  . gabapentin (NEURONTIN) 800 MG tablet Three times a day as needed  270 tablet  1  . metoprolol tartrate (LOPRESSOR) 25 MG tablet Take 1 tablet (25 mg total) by mouth 2 (two) times daily.  180 tablet  3  . Multiple Vitamins-Minerals (MULTIVITAL) tablet Take 1 tablet by mouth daily.        . ondansetron (ZOFRAN ODT) 4 MG disintegrating tablet Take 4 mg by mouth as needed.        . Psyllium (METAMUCIL) 30.9 % POWD Take by mouth daily.        Marland Kitchen zolpidem (AMBIEN CR) 12.5 MG CR tablet Take 1 tablet (12.5 mg total) by mouth at bedtime.  90 tablet  1    Allergies  Allergen Reactions  . Ceftin     GI problems  . Darvocet (Propoxyphene N-Acetaminophen)   . Erythromycin     Upset stomach  . Mevacor     . Paxil     jittery  . Penicillins   . Sulfamethoxazole     REACTION: unspecified  . Theophyllines   . Tussionex Pennkinetic Er     nausea    Patient Active Problem List  Diagnoses  . POLYP, COLON  . HYPERLIPIDEMIA  . ANXIETY  . DEPRESSION  . UNSPECIFIED RETINAL VASCULAR OCCLUSION  . HYPERTENSION  . EXTERNAL HEMORRHOIDS  . COPD  . GASTROESOPHAGEAL REFLUX DISEASE  . GASTRITIS  . DIVERTICULOSIS, COLON  . ABDOMINAL PAIN, GENERALIZED  . Raynaud's disease /phenomenon  . Idiopathic peripheral neuropathy  . MVP (mitral valve prolapse)  . Palpitations  . Osteoporosis    History  Smoking status  . Former Smoker  Smokeless tobacco  . Not on file    History  Alcohol Use  . Yes    Family History  Problem Relation Age of Onset  . Breast cancer Mother   . Liver disease Brother   . Cirrhosis Brother     Review of Systems: Constitutional: no fever chills diaphoresis or fatigue or change in weight.  Head and neck: no hearing  loss, no epistaxis, no photophobia or visual disturbance. Respiratory: No cough, shortness of breath or wheezing. Cardiovascular: No chest pain peripheral edema, palpitations. Gastrointestinal: No abdominal distention, no abdominal pain, no change in bowel habits hematochezia or melena. Genitourinary: No dysuria, no frequency, no urgency, no nocturia. Musculoskeletal:No arthralgias, no back pain, no gait disturbance or myalgias. Neurological: No dizziness, no headaches, no numbness, no seizures, no syncope, no weakness, no tremors. Hematologic: No lymphadenopathy, no easy bruising. Psychiatric: No confusion, no hallucinations, no sleep disturbance.    Physical Exam: Filed Vitals:   09/11/11 1412  BP: 110/78  Pulse: 66   the general appearance reveals a thin woman in no acute distress.Pupils equal and reactive.   Extraocular Movements are full.  There is no scleral icterus.  The mouth and pharynx are normal.  The neck is supple.  The carotids  reveal no bruits.  The jugular venous pressure is normal.  The thyroid is not enlarged.  There is no lymphadenopathy.  The chest is clear to percussion and auscultation. There are no rales or rhonchi. Expansion of the chest is symmetrical.  Heart reveals a soft systolic murmur at the apex.  There is no gallop or rub.The abdomen is soft and nontender. Bowel sounds are normal. The liver and spleen are not enlarged. There Are no abdominal masses. There are no bruits.  The pedal pulses are good.  There is no phlebitis or edema.  There is no cyanosis or clubbing.  Extremities are cool to the touch but she does have adequate pedal pulses.Strength is normal and symmetrical in all extremities.  There is no lateralizing weakness.  There are no sensory deficits.  She does have decreased sensation in the lower extremities consistent with her idiopathic peripheral neuropathy  The skin is warm and dry.  There is no rash.     Assessment / Plan:  Continue present medication.  Recheck in 4 months for followup office visit lipid panel hepatic function panel and basal metabolic panel.  Continue to be sure she does not lose weight by eating an adequate amount of calories

## 2011-09-11 NOTE — Assessment & Plan Note (Signed)
The patient has a history of asthma anxiety and depression.  She has difficulty with insomnia as a result of this and does take Ambien CR which has been effective and she is not having any side effects.

## 2011-09-11 NOTE — Patient Instructions (Signed)
Your physician recommends that you continue on your current medications as directed. Please refer to the Current Medication list given to you today. Your physician wants you to follow-up in: 4 month You will receive a reminder letter in the mail two months in advance. If you don't receive a letter, please call our office to schedule the follow-up appointment.  

## 2011-09-23 ENCOUNTER — Telehealth: Payer: Self-pay | Admitting: Cardiology

## 2011-09-23 DIAGNOSIS — F329 Major depressive disorder, single episode, unspecified: Secondary | ICD-10-CM

## 2011-09-23 DIAGNOSIS — R110 Nausea: Secondary | ICD-10-CM

## 2011-09-23 NOTE — Telephone Encounter (Signed)
New Problem:     Patient called wanting to know what she could expect from Dr. Patty Sermons in his change form doing primary care. Patient said this is not an emergency and to feel free to call tomorrow.

## 2011-09-24 ENCOUNTER — Encounter: Payer: Self-pay | Admitting: Cardiology

## 2011-09-24 NOTE — Telephone Encounter (Signed)
Takes Lexapro in the winter, gets depressed during that time.  Has started back on it, but it gives it an awful taste in her mouth.  She would like something else, Paxil makes her jittery.  Is it ok to refill Zofran.

## 2011-09-24 NOTE — Telephone Encounter (Signed)
For depression can try switch to Zoloft 50 mg daily. OK to refill Zofran.

## 2011-09-25 MED ORDER — ONDANSETRON 8 MG PO TBDP: 8.0000 mg | ORAL_TABLET | ORAL | Status: DC | PRN

## 2011-09-25 MED ORDER — SERTRALINE HCL 50 MG PO TABS: 50.0000 mg | ORAL_TABLET | Freq: Every day | ORAL | Status: DC

## 2011-09-25 NOTE — Telephone Encounter (Signed)
Advised patient

## 2011-10-02 ENCOUNTER — Telehealth: Payer: Self-pay | Admitting: Cardiology

## 2011-10-02 NOTE — Telephone Encounter (Signed)
New msg °Patient returning your call ° °

## 2011-10-16 ENCOUNTER — Encounter: Payer: Self-pay | Admitting: Cardiology

## 2011-10-16 ENCOUNTER — Other Ambulatory Visit: Payer: Self-pay | Admitting: Cardiology

## 2011-10-16 DIAGNOSIS — F419 Anxiety disorder, unspecified: Secondary | ICD-10-CM

## 2011-10-16 MED ORDER — LORAZEPAM 1 MG PO TABS: 1.0000 mg | ORAL_TABLET | Freq: Two times a day (BID) | ORAL | Status: AC | PRN

## 2011-10-16 NOTE — Telephone Encounter (Signed)
New Msg: pt calling wanting to speak nurse regarding medication, Zoloft,  pt stopped taking on her on. Pt said medication was making her anorexic and nauseas. Pt said she had only taken medication for 10 days.  Please return pt call to discuss further.

## 2011-10-16 NOTE — Telephone Encounter (Signed)
Called to pharmacy 

## 2011-10-16 NOTE — Telephone Encounter (Signed)
Couldn't even stand sight of food.  Does have Rx for Lorazepam 1 mg that she takes 1/2 of it as needed, ( Rx says 1 twice daily as needed) got it last March.  Ok to fill?

## 2011-10-16 NOTE — Telephone Encounter (Signed)
Yes, okay to fill

## 2011-10-16 NOTE — Telephone Encounter (Signed)
Refilled zetia.

## 2011-12-16 ENCOUNTER — Other Ambulatory Visit: Payer: Self-pay | Admitting: *Deleted

## 2011-12-16 ENCOUNTER — Telehealth: Payer: Self-pay | Admitting: Cardiology

## 2011-12-16 MED ORDER — AZITHROMYCIN 250 MG PO TABS: ORAL_TABLET | ORAL | Status: AC

## 2011-12-16 MED ORDER — GUAIFENESIN ER 600 MG PO TB12: 600.0000 mg | ORAL_TABLET | Freq: Two times a day (BID) | ORAL | Status: DC

## 2011-12-16 NOTE — Telephone Encounter (Signed)
Please return patient call at hm# [phone]   Patient would like to discuss medications, she can be reached at hm# [phone]

## 2011-12-16 NOTE — Telephone Encounter (Signed)
Z-pak, mucinex and tussionex called into drug store for pt.

## 2011-12-19 ENCOUNTER — Ambulatory Visit: Payer: Medicare Other

## 2011-12-19 ENCOUNTER — Ambulatory Visit (INDEPENDENT_AMBULATORY_CARE_PROVIDER_SITE_OTHER): Payer: Medicare Other | Admitting: Family Medicine

## 2011-12-19 VITALS — BP 130/80 | HR 90 | Temp 98.6°F | Resp 16 | Ht 62.75 in | Wt 94.8 lb

## 2011-12-19 DIAGNOSIS — R0602 Shortness of breath: Secondary | ICD-10-CM

## 2011-12-19 DIAGNOSIS — R059 Cough, unspecified: Secondary | ICD-10-CM

## 2011-12-19 DIAGNOSIS — R05 Cough: Secondary | ICD-10-CM

## 2011-12-19 DIAGNOSIS — J40 Bronchitis, not specified as acute or chronic: Secondary | ICD-10-CM

## 2011-12-19 DIAGNOSIS — J329 Chronic sinusitis, unspecified: Secondary | ICD-10-CM

## 2011-12-19 MED ORDER — ALBUTEROL SULFATE HFA 108 (90 BASE) MCG/ACT IN AERS: 2.0000 | INHALATION_SPRAY | Freq: Four times a day (QID) | RESPIRATORY_TRACT | Status: DC | PRN

## 2011-12-19 MED ORDER — LEVOFLOXACIN 500 MG PO TABS: 500.0000 mg | ORAL_TABLET | Freq: Every day | ORAL | Status: DC

## 2011-12-19 NOTE — Progress Notes (Signed)
[facility]:  Office Visit  Chief Complaint:  Chief Complaint  Patient presents with  . Cough    dry x 1 week  . Sore Throat    x 1 week  and nasal congestion    HPI: Jenny Torres is a 78 y.o. female who complains of  sinus pressure, cough, SOB. On Z-pack. She feels better on Z-pack however still feels there is some chest congestion and SOB. Denies fevers, chills, N/V, CP, wheezing, lower leg swelling.   Past Medical History  Diagnosis Date  . Anxiety   . Depression   . COPD (chronic obstructive pulmonary disease)   . HTN (hypertension)   . Gastritis   . Polyp of colon   . Hemorrhoids, external   . Diverticula, colon   . GERD (gastroesophageal reflux disease)   . Carotid artery occlusion   . Idiopathic peripheral neuropathy     chronic  . Hyperlipidemia   . MVP (mitral valve prolapse)     stable  . Palpitations   . Osteoporosis    Past Surgical History  Procedure Date  . Cholecystectomy   . C-sections     x2   History   Social History  . Marital Status: Married    Spouse Name: N/A    Number of Children: N/A  . Years of Education: N/A   Social History Main Topics  . Smoking status: Former Games developer  . Smokeless tobacco: Not on file  . Alcohol Use: Yes  . Drug Use: No  . Sexually Active:    Other Topics Concern  . Not on file   Social History Narrative   Retired.    Family History  Problem Relation Age of Onset  . Breast cancer Mother   . Liver disease Brother   . Cirrhosis Brother    Allergies  Allergen Reactions  . Ceftin     GI problems  . Darvocet (Propoxacet-N)   . Erythromycin     Upset stomach  . Lovastatin   . Paxil     jittery  . Penicillins   . Sulfamethoxazole     REACTION: unspecified  . Theophyllines   . Tussionex Pennkinetic Er     nausea   Prior to Admission medications   Medication Sig Start Date End Date Taking? Authorizing Provider  aspirin 81 MG tablet Take 81 mg by mouth daily.     Yes  Historical Provider, MD  azithromycin (ZITHROMAX) 250 MG tablet Take 2 tablets (500 mg) on  Day 1,  followed by 1 tablet (250 mg) once daily on Days 2 through 5. 12/16/11 12/21/11 Yes Cassell Clement, MD  calcium-vitamin D (OSCAL 500/200 D-3) 500-200 MG-UNIT per tablet Take 2 tablets by mouth daily.     Yes Historical Provider, MD  fluocinonide (LIDEX) 0.05 % cream Apply topically 2 (two) times daily. As directed by dermatologist    Yes Historical Provider, MD  fluticasone [facility]) 50 MCG/ACT nasal spray AS DIRECTED 12/16/10  Yes Cassell Clement, MD  gabapentin (NEURONTIN) 800 MG tablet Three times a day as needed 05/01/11  Yes Cassell Clement, MD  guaiFENesin (MUCINEX) 600 MG 12 hr tablet Take 1 tablet (600 mg total) by mouth 2 (two) times daily. 12/16/11 12/15/12 Yes Cassell Clement, MD  metoprolol tartrate (LOPRESSOR) 25 MG tablet Take 1 tablet (25 mg total) by mouth 2 (two) times daily. 07/24/11 07/23/12 Yes Cassell Clement, MD  Multiple Vitamins-Minerals (MULTIVITAL) tablet Take 1 tablet by mouth daily.  Yes Historical Provider, MD  ondansetron (ZOFRAN ODT) 8 MG disintegrating tablet Take 1 tablet (8 mg total) by mouth as needed. 09/25/11  Yes Cassell Clement, MD  Psyllium (METAMUCIL) 30.9 % POWD Take by mouth daily.     Yes Historical Provider, MD  sertraline (ZOLOFT) 50 MG tablet Take 1 tablet (50 mg total) by mouth daily. 09/25/11 09/24/12 Yes Cassell Clement, MD  ZETIA 10 MG tablet TAKE 1 TABLET DAILY 10/16/11  Yes Cassell Clement, MD  zolpidem (AMBIEN CR) 12.5 MG CR tablet Take 1 tablet (12.5 mg total) by mouth at bedtime. 07/24/11  Yes Cassell Clement, MD     ROS: The patient denies fevers, chills, night sweats, unintentional weight loss, chest pain, palpitations, wheezing, dyspnea on exertion, nausea, vomiting, abdominal pain, dysuria, hematuria, melena, numbness, weakness, or tingling.  All other systems have been reviewed and were otherwise negative with the exception of those mentioned  in the HPI and as above.    PHYSICAL EXAM: Filed Vitals:   12/19/11 0926  BP: 130/80  Pulse: 90  Temp: 98.6 F (37 C)  Resp: 16  Spo2 97% Filed Vitals:   12/19/11 0926  Height: 5' 2.75" (1.594 m)  Weight: 94 lb 12.8 oz (43.001 kg)   Body mass index is 16.93 kg/(m^2).  General: Alert, no acute distress HEENT:  Normocephalic, atraumatic, oropharynx patent. EOMI, PERRLA, no exudates, nl TM, + sinus tenderness  Cardiovascular:  Regular rate and rhythm, no rubs murmurs or gallops.  No Carotid bruits, radial pulse intact. No pedal edema.  Respiratory: Clear to auscultation bilaterally.  No wheezes, rales, or rhonchi.  No cyanosis, no use of accessory musculature GI: No organomegaly, abdomen is soft and non-tender, positive bowel sounds.  No masses. Skin: No rashes. Neurologic: Facial musculature symmetric. Psychiatric: Patient is appropriate throughout our interaction. Lymphatic: No cervical lymphadenopathy Musculoskeletal: Gait intact.   LABS: Results for orders placed in visit on 09/09/11  LIPID PANEL      Component Value Range   Cholesterol 198  0 - 200 (mg/dL)   Triglycerides 10.2  0.0 - 149.0 (mg/dL)   HDL 72.53  >66.44 (mg/dL)   VLDL 03.4  0.0 - 74.2 (mg/dL)   LDL Cholesterol 97  0 - 99 (mg/dL)   Total CHOL/HDL Ratio 2    HEPATIC FUNCTION PANEL      Component Value Range   Total Bilirubin 0.6  0.3 - 1.2 (mg/dL)   Bilirubin, Direct 0.1  0.0 - 0.3 (mg/dL)   Alkaline Phosphatase 94  39 - 117 (U/L)   AST 28  0 - 37 (U/L)   ALT 18  0 - 35 (U/L)   Total Protein 6.9  6.0 - 8.3 (g/dL)   Albumin 4.1  3.5 - 5.2 (g/dL)  BASIC METABOLIC PANEL      Component Value Range   Sodium 136  135 - 145 (mEq/L)   Potassium 3.9  3.5 - 5.1 (mEq/L)   Chloride 100  96 - 112 (mEq/L)   CO2 29  19 - 32 (mEq/L)   Glucose, Bld 80  70 - 99 (mg/dL)   BUN 10  6 - 23 (mg/dL)   Creatinine, Ser 0.7  0.4 - 1.2 (mg/dL)   Calcium 9.3  8.4 - 59.5 (mg/dL)   GFR 63.87  >56.43 (mL/min)      EKG/XRAY:   Primary read interpreted by Dr. Conley Rolls at [facility]. ? RLL infiltrate vs scar tissue   ASSESSMENT/PLAN: Encounter Diagnoses  Name Primary?  . Cough Yes  .  SOB (shortness of breath)   . Bronchitis   . Sinusitis    Most likely Bronchitis C/w Flonase, Mucinex  Rx Levaquin since not improved on Z-pack. F/u prn if no improvement in 48-72 hrs   Deondrea Aguado PHUONG, DO 12/19/2011 10:20 AM

## 2011-12-28 ENCOUNTER — Telehealth: Payer: Self-pay | Admitting: Cardiology

## 2011-12-28 NOTE — Telephone Encounter (Signed)
New problem:  Patient calling medication that was given 2 week ago - pap doesn't work.

## 2011-12-28 NOTE — Telephone Encounter (Signed)
Head stopped up and still coughing, nonproductive.  Still using Mucinex and just not getting any better.  Zpak was prescribed on 3/27.  Patient went to [facility] and saw Dr Conley Rolls and was Rx'd Levaquin on 3/30.  She didn't start the Levaquin as prescribed, advised to start medication.  Did advise if no better in a couple of days or worse to go back to Urgent Care to be rechecked.  Verbalized understanding

## 2011-12-30 ENCOUNTER — Other Ambulatory Visit: Payer: Self-pay | Admitting: Cardiology

## 2011-12-30 NOTE — Telephone Encounter (Signed)
Refilled gabapentin

## 2012-01-05 ENCOUNTER — Ambulatory Visit (INDEPENDENT_AMBULATORY_CARE_PROVIDER_SITE_OTHER): Payer: Medicare Other | Admitting: Cardiology

## 2012-01-05 ENCOUNTER — Encounter: Payer: Self-pay | Admitting: Cardiology

## 2012-01-05 VITALS — BP 130/80 | HR 68 | Ht 64.0 in | Wt 94.0 lb

## 2012-01-05 DIAGNOSIS — I059 Rheumatic mitral valve disease, unspecified: Secondary | ICD-10-CM

## 2012-01-05 DIAGNOSIS — G589 Mononeuropathy, unspecified: Secondary | ICD-10-CM

## 2012-01-05 DIAGNOSIS — F329 Major depressive disorder, single episode, unspecified: Secondary | ICD-10-CM

## 2012-01-05 DIAGNOSIS — E785 Hyperlipidemia, unspecified: Secondary | ICD-10-CM

## 2012-01-05 DIAGNOSIS — G629 Polyneuropathy, unspecified: Secondary | ICD-10-CM

## 2012-01-05 DIAGNOSIS — E7800 Pure hypercholesterolemia, unspecified: Secondary | ICD-10-CM

## 2012-01-05 DIAGNOSIS — I341 Nonrheumatic mitral (valve) prolapse: Secondary | ICD-10-CM

## 2012-01-05 DIAGNOSIS — R002 Palpitations: Secondary | ICD-10-CM

## 2012-01-05 NOTE — Assessment & Plan Note (Signed)
The patient has a long history of situational depression and has improved on Zoloft 50 mg daily.  She does not sleep well at night and does take Ambien CR 12.5 mg at bedtime with good results

## 2012-01-05 NOTE — Assessment & Plan Note (Signed)
The patient has not had any recent severe palpitations.  She's not having any exertional chest pain or angina.  He does not have any history of known ischemic heart disease.

## 2012-01-05 NOTE — Progress Notes (Signed)
Jenny Torres Date of Birth:  1928-06-09 [facility] [address] [place], [place]  [address] [phone]         Fax   [phone]  History of Present Illness: This pleasant 78 year old woman is seen for a scheduled four-month followup office visit.  He has a past history of palpitations and mitral valve prolapse.  Also has a history of essential hypertension and hypercholesterolemia.  She has a history of Raynaud's disease.  She has had situational depression related to ongoing stress regarding her husband's dementia.  She has a history of allergies and past history of asthma.  Current Outpatient Prescriptions  Medication Sig Dispense Refill  . albuterol (PROVENTIL HFA;VENTOLIN HFA) 108 (90 BASE) MCG/ACT inhaler Inhale 2 puffs into the lungs every 6 (six) hours as needed for wheezing.  1 Inhaler  0  . aspirin 81 MG tablet Take 81 mg by mouth daily.        . fluocinonide (LIDEX) 0.05 % cream Apply topically 2 (two) times daily. As directed by dermatologist       . fluticasone (FLONASE) 50 MCG/ACT nasal spray AS DIRECTED  16 g  5  . gabapentin (NEURONTIN) 800 MG tablet TAKE 1 TABLET THREE TIMES A DAY AS NEEDED  270 tablet  3  . guaiFENesin (MUCINEX) 600 MG 12 hr tablet Take 1 tablet (600 mg total) by mouth 2 (two) times daily.  60 tablet  2  . metoprolol tartrate (LOPRESSOR) 25 MG tablet Take 1 tablet (25 mg total) by mouth 2 (two) times daily.  180 tablet  3  . Multiple Vitamins-Minerals (MULTIVITAL) tablet Take 1 tablet by mouth daily.        . ondansetron (ZOFRAN ODT) 8 MG disintegrating tablet Take 1 tablet (8 mg total) by mouth as needed.  10 tablet  1  . Psyllium (METAMUCIL) 30.9 % POWD Take by mouth daily.        Marland Kitchen ZETIA 10 MG tablet TAKE 1 TABLET DAILY  90 tablet  3  . zolpidem (AMBIEN CR) 12.5 MG CR tablet Take 1 tablet (12.5 mg total) by mouth at bedtime.  90 tablet  1  . sertraline (ZOLOFT) 50 MG tablet Take 1 tablet (50 mg total) by mouth daily.  30  tablet  5  . DISCONTD: metoprolol tartrate (LOPRESSOR) 25 MG tablet Take 1 tablet (25 mg total) by mouth daily.  90 tablet  3    Allergies  Allergen Reactions  . Ceftin     GI problems  . Darvocet (Propoxacet-N)   . Erythromycin     Upset stomach  . Lovastatin   . Paxil     jittery  . Penicillins   . Sulfamethoxazole     REACTION: unspecified  . Theophyllines   . Tussionex Pennkinetic Er     nausea    Patient Active Problem List  Diagnoses  . POLYP, COLON  . HYPERLIPIDEMIA  . ANXIETY  . DEPRESSION  . UNSPECIFIED RETINAL VASCULAR OCCLUSION  . HYPERTENSION  . EXTERNAL HEMORRHOIDS  . COPD  . GASTROESOPHAGEAL REFLUX DISEASE  . GASTRITIS  . DIVERTICULOSIS, COLON  . ABDOMINAL PAIN, GENERALIZED  . Raynaud's disease /phenomenon  . Idiopathic peripheral neuropathy  . MVP (mitral valve prolapse)  . Palpitations  . Osteoporosis    History  Smoking status  . Former Smoker  Smokeless tobacco  . Not on file    History  Alcohol Use  . Yes    Family History  Problem Relation Age  of Onset  . Breast cancer Mother   . Liver disease Brother   . Cirrhosis Brother     Review of Systems: Constitutional: no fever chills diaphoresis or fatigue or change in weight.  Head and neck: no hearing loss, no epistaxis, no photophobia or visual disturbance. Respiratory: No cough, shortness of breath or wheezing. Cardiovascular: No chest pain peripheral edema, palpitations. Gastrointestinal: No abdominal distention, no abdominal pain, no change in bowel habits hematochezia or melena. Genitourinary: No dysuria, no frequency, no urgency, no nocturia. Musculoskeletal:No arthralgias, no back pain, no gait disturbance or myalgias. Neurological: No dizziness, no headaches, no numbness, no seizures, no syncope, no weakness, no tremors. Hematologic: No lymphadenopathy, no easy bruising. Psychiatric: No confusion, no hallucinations, no sleep disturbance.    Physical Exam: Filed  Vitals:   01/05/12 1632  BP: 130/80  Pulse: 68   the general appearance reveals a thin woman who has lost 6 more pounds since last visit.  She is too thin now.Pupils equal and reactive.   Extraocular Movements are full.  There is no scleral icterus.  The mouth and pharynx are normal.  The neck is supple.  The carotids reveal no bruits.  The jugular venous pressure is normal.  The thyroid is not enlarged.  There is no lymphadenopathy.  Chest reveals mild expiratory wheezes and she has been using her inhaler when necessary.  The precordium is quiet.  The first heart sound is normal.  The second heart sound is physiologically split.  There is no murmur.  There is a soft apical midsystolic click.There is no abnormal lift or heave. The abdomen is soft and nontender. Bowel sounds are normal. The liver and spleen are not enlarged. There Are no abdominal masses. There are no bruits.  The pedal pulses are good.  There is no phlebitis or edema.  There is no cyanosis or clubbing. Strength is normal and symmetrical in all extremities.  There is no lateralizing weakness.  There are no sensory deficits.  The skin is warm and dry.  There is no rash.  Her hands are cool to touch from her Raynaud's disease    Assessment / Plan:  Continue on same medication.  I have encouraged her to eat 5 or 6 small meals a day in order to gain weight.  Recheck in 4 months for followup office visit and fasting lipid panel hepatic function panel and basal metabolic panel.

## 2012-01-05 NOTE — Assessment & Plan Note (Signed)
The patient has a history of hypercholesterolemia.  She is intolerant of statins.  She does have a peripheral neuropathy which she attributes to previous statin usage.  The patient takes ezetimibe for control of cholesterol.  She will return in the next several days for a fasting lab work to check her lipids

## 2012-01-05 NOTE — Patient Instructions (Signed)
Try to eat more to gain weight  Return for fasting labs soon  Your physician recommends that you continue on your current medications as directed. Please refer to the Current Medication list given to you today.  Your physician recommends that you schedule a follow-up appointment in: 4 months with fasting labs (lp/bmet/hfp)

## 2012-01-11 ENCOUNTER — Other Ambulatory Visit: Payer: Medicare Other

## 2012-01-13 ENCOUNTER — Telehealth: Payer: Self-pay | Admitting: *Deleted

## 2012-01-13 ENCOUNTER — Other Ambulatory Visit (INDEPENDENT_AMBULATORY_CARE_PROVIDER_SITE_OTHER): Payer: Medicare Other

## 2012-01-13 DIAGNOSIS — E7800 Pure hypercholesterolemia, unspecified: Secondary | ICD-10-CM

## 2012-01-13 LAB — BASIC METABOLIC PANEL
BUN: 9 mg/dL (ref 6–23)
CO2: 27 mEq/L (ref 19–32)
Chloride: 96 mEq/L (ref 96–112)
Glucose, Bld: 81 mg/dL (ref 70–99)
Potassium: 3.8 mEq/L (ref 3.5–5.1)
Sodium: 131 mEq/L — ABNORMAL LOW (ref 135–145)

## 2012-01-13 LAB — HEPATIC FUNCTION PANEL
ALT: 18 U/L (ref 0–35)
AST: 29 U/L (ref 0–37)
Albumin: 3.7 g/dL (ref 3.5–5.2)
Alkaline Phosphatase: 71 U/L (ref 39–117)
Total Protein: 6.3 g/dL (ref 6.0–8.3)

## 2012-01-13 LAB — LIPID PANEL
Cholesterol: 173 mg/dL (ref 0–200)
VLDL: 12 mg/dL (ref 0.0–40.0)

## 2012-01-13 NOTE — Telephone Encounter (Signed)
Advised of labs 

## 2012-01-13 NOTE — Telephone Encounter (Signed)
Message copied by Burnell Blanks on Wed Jan 13, 2012  4:28 PM ------      Message from: Cassell Clement      Created: Wed Jan 13, 2012  4:20 PM       Blood work all good except serum sodium is low. Try to increase dietary salt slightly.      Lipids are good.

## 2012-01-22 ENCOUNTER — Other Ambulatory Visit: Payer: Self-pay | Admitting: Cardiology

## 2012-01-22 MED ORDER — METOPROLOL TARTRATE 25 MG PO TABS: 25.0000 mg | ORAL_TABLET | Freq: Two times a day (BID) | ORAL | Status: DC

## 2012-01-22 NOTE — Telephone Encounter (Signed)
Refill- metoprolol tartrate (LOPRESSOR) 25 MG tablet  Verified preferred [facility], patient request 90 day supply. Patient can be reached at [phone] for additional questions.

## 2012-01-22 NOTE — Telephone Encounter (Signed)
Refilled metoprolol to [facility]

## 2012-02-02 ENCOUNTER — Other Ambulatory Visit: Payer: Self-pay | Admitting: Cardiology

## 2012-02-02 DIAGNOSIS — G4700 Insomnia, unspecified: Secondary | ICD-10-CM

## 2012-02-02 MED ORDER — ZOLPIDEM TARTRATE ER 12.5 MG PO TBCR: 12.5000 mg | EXTENDED_RELEASE_TABLET | Freq: Every day | ORAL | Status: DC

## 2012-02-02 NOTE — Telephone Encounter (Signed)
Pt calling re refill of zolpidem, [facility]

## 2012-02-05 ENCOUNTER — Telehealth: Payer: Self-pay | Admitting: Cardiology

## 2012-02-05 DIAGNOSIS — G4700 Insomnia, unspecified: Secondary | ICD-10-CM

## 2012-02-05 MED ORDER — ZOLPIDEM TARTRATE ER 12.5 MG PO TBCR: 12.5000 mg | EXTENDED_RELEASE_TABLET | Freq: Every day | ORAL | Status: DC

## 2012-02-05 NOTE — Telephone Encounter (Signed)
Fu msg Pt wanted to talk to you again

## 2012-02-05 NOTE — Telephone Encounter (Signed)
Pt calling re medication °

## 2012-02-05 NOTE — Telephone Encounter (Signed)
Called Rx for Ambien CR will fax to [facility] when  Dr. Patty Sermons signs

## 2012-02-11 ENCOUNTER — Other Ambulatory Visit: Payer: Self-pay | Admitting: Cardiology

## 2012-02-11 DIAGNOSIS — G4700 Insomnia, unspecified: Secondary | ICD-10-CM

## 2012-02-11 NOTE — Telephone Encounter (Signed)
Per pt call she needs a 90day supply

## 2012-02-17 ENCOUNTER — Telehealth: Payer: Self-pay | Admitting: Cardiology

## 2012-02-17 NOTE — Telephone Encounter (Signed)
Walk In pt Form " Pt Dropped Off Handicapped Pla-Card paper needs to  Be completed" sent to [facility]  02/17/12/KM

## 2012-02-24 ENCOUNTER — Other Ambulatory Visit: Payer: Self-pay | Admitting: *Deleted

## 2012-02-24 DIAGNOSIS — J302 Other seasonal allergic rhinitis: Secondary | ICD-10-CM

## 2012-02-24 MED ORDER — FLUTICASONE PROPIONATE 50 MCG/ACT NA SUSP: 1.0000 | Freq: Every day | NASAL | Status: DC

## 2012-03-04 ENCOUNTER — Other Ambulatory Visit (HOSPITAL_COMMUNITY): Payer: Self-pay | Admitting: *Deleted

## 2012-03-04 DIAGNOSIS — J302 Other seasonal allergic rhinitis: Secondary | ICD-10-CM

## 2012-03-04 MED ORDER — FLUTICASONE PROPIONATE 50 MCG/ACT NA SUSP: 1.0000 | Freq: Every day | NASAL | Status: DC

## 2012-03-04 NOTE — Telephone Encounter (Signed)
Refilled flonase  

## 2012-04-29 ENCOUNTER — Other Ambulatory Visit: Payer: Self-pay | Admitting: *Deleted

## 2012-04-29 MED ORDER — METOPROLOL TARTRATE 25 MG PO TABS: 25.0000 mg | ORAL_TABLET | Freq: Two times a day (BID) | ORAL | Status: DC

## 2012-04-29 NOTE — Telephone Encounter (Signed)
Fax Received. Refill Completed. Jenny Torres (R.M.A)   

## 2012-05-09 ENCOUNTER — Telehealth: Payer: Self-pay | Admitting: Cardiology

## 2012-05-09 DIAGNOSIS — R05 Cough: Secondary | ICD-10-CM

## 2012-05-09 NOTE — Telephone Encounter (Signed)
Left pt a message to call back. 

## 2012-05-09 NOTE — Telephone Encounter (Signed)
Pt is coughing up blood and wants to know what to do and wants Dr. Patty Sermons to tell her what to do would like a nurse to call today

## 2012-05-10 NOTE — Telephone Encounter (Signed)
Patient has appointment Thursday and will get Xray before appointment.  Stated better today. Will call back if worse before appointment

## 2012-05-10 NOTE — Telephone Encounter (Signed)
Will forward to Melinda Pratt. 

## 2012-05-12 ENCOUNTER — Ambulatory Visit (INDEPENDENT_AMBULATORY_CARE_PROVIDER_SITE_OTHER): Payer: Medicare Other | Admitting: Cardiology

## 2012-05-12 ENCOUNTER — Other Ambulatory Visit: Payer: Self-pay | Admitting: *Deleted

## 2012-05-12 ENCOUNTER — Ambulatory Visit
Admission: RE | Admit: 2012-05-12 | Discharge: 2012-05-12 | Disposition: A | Discharge status: Home / Self Care | Payer: Medicare Other | Source: Ambulatory Visit | Attending: Cardiology | Admitting: Cardiology

## 2012-05-12 ENCOUNTER — Telehealth: Payer: Self-pay | Admitting: *Deleted

## 2012-05-12 ENCOUNTER — Encounter: Payer: Self-pay | Admitting: Cardiology

## 2012-05-12 VITALS — BP 110/68 | HR 66 | Ht 64.0 in | Wt 91.0 lb

## 2012-05-12 DIAGNOSIS — I7300 Raynaud's syndrome without gangrene: Secondary | ICD-10-CM

## 2012-05-12 DIAGNOSIS — E7800 Pure hypercholesterolemia, unspecified: Secondary | ICD-10-CM

## 2012-05-12 DIAGNOSIS — F411 Generalized anxiety disorder: Secondary | ICD-10-CM

## 2012-05-12 DIAGNOSIS — R9389 Abnormal findings on diagnostic imaging of other specified body structures: Secondary | ICD-10-CM

## 2012-05-12 DIAGNOSIS — I10 Essential (primary) hypertension: Secondary | ICD-10-CM

## 2012-05-12 DIAGNOSIS — IMO0001 Reserved for inherently not codable concepts without codable children: Secondary | ICD-10-CM

## 2012-05-12 DIAGNOSIS — R042 Hemoptysis: Secondary | ICD-10-CM | POA: Insufficient documentation

## 2012-05-12 DIAGNOSIS — F419 Anxiety disorder, unspecified: Secondary | ICD-10-CM

## 2012-05-12 DIAGNOSIS — R05 Cough: Secondary | ICD-10-CM

## 2012-05-12 LAB — BASIC METABOLIC PANEL
Chloride: 91 mEq/L — ABNORMAL LOW (ref 96–112)
Creatinine, Ser: 0.3 mg/dL — ABNORMAL LOW (ref 0.4–1.2)
Sodium: 126 mEq/L — ABNORMAL LOW (ref 135–145)

## 2012-05-12 LAB — LIPID PANEL
Cholesterol: 188 mg/dL (ref 0–200)
HDL: 79.9 mg/dL (ref >39.00)
LDL Cholesterol: 94 mg/dL (ref 0–99)
Triglycerides: 72 mg/dL (ref 0.0–149.0)
VLDL: 14.4 mg/dL (ref 0.0–40.0)

## 2012-05-12 LAB — HEPATIC FUNCTION PANEL
Albumin: 4 g/dL (ref 3.5–5.2)
Alkaline Phosphatase: 79 U/L (ref 39–117)
Total Protein: 6.7 g/dL (ref 6.0–8.3)

## 2012-05-12 MED ORDER — LORAZEPAM 1 MG PO TABS: ORAL_TABLET | ORAL | Status: DC

## 2012-05-12 NOTE — Telephone Encounter (Signed)
Message copied by Burnell Blanks on Thu May 12, 2012  3:20 PM ------      Message from: Cassell Clement      Created: Thu May 12, 2012  2:44 PM       Since last visit, her lipids are stable but her serum sodium has fallen to 126.  I want her to increase dietary salt slightly and cut back on total fluid intake to 1500 cc daily.  Recheck a basal metabolic panel in one month

## 2012-05-12 NOTE — Patient Instructions (Addendum)
Decrease your Metoprolol to 25 mg 1/2 twice a day  Go for CT of chest and will call with results

## 2012-05-12 NOTE — Telephone Encounter (Signed)
Advised patient and scheduled follow up labs  

## 2012-05-12 NOTE — Assessment & Plan Note (Signed)
She had the one episode of hemoptysis several days ago and no recurrent symptoms.  We will proceed with a CT of the chest as recommended by radiology.

## 2012-05-12 NOTE — Assessment & Plan Note (Signed)
The patient is still having a lot of symptoms from her Raynaud's disease.  We will try reducing her metoprolol to 12.5 mg twice a day see if this helps.  We may need to consider stopping the beta blocker altogether and using a calcium channel blocker

## 2012-05-12 NOTE — Telephone Encounter (Signed)
Message copied by Burnell Blanks on Thu May 12, 2012  3:21 PM ------      Message from: Cassell Clement      Created: Thu May 12, 2012  2:20 PM       Please report.  The CT of the chest revealed some peribronchial nodularity.  This could represent an atypical infection or an inflammatory process.  They recommend a repeat followup CT of the chest and 3-4 months to assess stability.

## 2012-05-12 NOTE — Assessment & Plan Note (Signed)
Blood pressure is stable on current therapy.  No recent chest pain or increased shortness of breath.

## 2012-05-12 NOTE — Progress Notes (Signed)
Jenny Torres Date of Birth:  May 10, 1928 [facility] [address] [place], [place]  [address] [phone]         Fax   [phone]  History of Present Illness: This pleasant 78 year old woman is seen for a scheduled four-month followup office visit. He has a past history of palpitations and mitral valve prolapse. Also has a history of essential hypertension and hypercholesterolemia. She has a history of Raynaud's disease. She has had situational depression related to ongoing stress regarding her husband's dementia. She has a history of allergies and past history of asthma. Several days ago she coughed up a small amount of fresh bright blood in the morning after brushing her teeth.  She is not sure what the source of the blood was.  She has not had any chest cold recently or any fever or chills she denies any chest pain or pleurisy.  Of interest is that in November 1963, 50 years ago, she had an episode of isolated hemoptysis and at that time underwent bronchoscopy and was told that she had a scar on her lung.  She has not had any subsequent symptoms.  We sent her for a chest x-ray today which shows a nodular opacity at the right lung base not previously seen and radiology has recommended a CT of the chest.  Current Outpatient Prescriptions  Medication Sig Dispense Refill  . albuterol (PROVENTIL HFA;VENTOLIN HFA) 108 (90 BASE) MCG/ACT inhaler Inhale 2 puffs into the lungs every 6 (six) hours as needed for wheezing.  1 Inhaler  0  . aspirin 81 MG tablet Take 81 mg by mouth daily.        . fluocinonide (LIDEX) 0.05 % cream Apply topically 2 (two) times daily. As directed by dermatologist       . fluticasone (FLONASE) 50 MCG/ACT nasal spray Place 1 spray into the nose daily.  16 g  5  . gabapentin (NEURONTIN) 800 MG tablet TAKE 1 TABLET THREE TIMES A DAY AS NEEDED  270 tablet  3  . guaiFENesin (MUCINEX) 600 MG 12 hr tablet Take 1 tablet (600 mg total) by mouth 2 (two) times  daily.  60 tablet  2  . LORazepam (ATIVAN) 1 MG tablet Take 1 mg by mouth every 8 (eight) hours.      . metoprolol tartrate (LOPRESSOR) 25 MG tablet Take 25 mg by mouth as directed. 1/2 tablet twice a day      . Multiple Vitamins-Minerals (MULTIVITAL) tablet Take 1 tablet by mouth daily.        . ondansetron (ZOFRAN ODT) 8 MG disintegrating tablet Take 1 tablet (8 mg total) by mouth as needed.  10 tablet  1  . Psyllium (METAMUCIL) 30.9 % POWD Take by mouth daily.        Marland Kitchen ZETIA 10 MG tablet TAKE 1 TABLET DAILY  90 tablet  3  . zolpidem (AMBIEN CR) 12.5 MG CR tablet Take 1 tablet (12.5 mg total) by mouth at bedtime.  90 tablet  1  . DISCONTD: metoprolol tartrate (LOPRESSOR) 25 MG tablet Take 1 tablet (25 mg total) by mouth 2 (two) times daily.  60 tablet  0    Allergies  Allergen Reactions  . Ceftin     GI problems  . Darvocet (Propoxyphene-Acetaminophen)   . Erythromycin     Upset stomach  . Hydrocod Polst-Cpm Polst Er     nausea  . Lovastatin   . Paroxetine Hcl     jittery  .  Penicillins   . Sulfamethoxazole     REACTION: unspecified  . Theophyllines     Patient Active Problem List  Diagnosis  . POLYP, COLON  . HYPERLIPIDEMIA  . ANXIETY  . DEPRESSION  . UNSPECIFIED RETINAL VASCULAR OCCLUSION  . HYPERTENSION  . EXTERNAL HEMORRHOIDS  . COPD  . GASTROESOPHAGEAL REFLUX DISEASE  . GASTRITIS  . DIVERTICULOSIS, COLON  . ABDOMINAL PAIN, GENERALIZED  . Raynaud's disease /phenomenon  . Idiopathic peripheral neuropathy  . MVP (mitral valve prolapse)  . Palpitations  . Osteoporosis  . Hemoptysis    History  Smoking status  . Former Smoker  Smokeless tobacco  . Not on file    History  Alcohol Use  . Yes    Family History  Problem Relation Age of Onset  . Breast cancer Mother   . Liver disease Brother   . Cirrhosis Brother     Review of Systems: Constitutional: no fever chills diaphoresis or fatigue or change in weight.  Head and neck: no hearing loss, no  epistaxis, no photophobia or visual disturbance. Respiratory: No cough, shortness of breath or wheezing. Cardiovascular: No chest pain peripheral edema, palpitations. Gastrointestinal: No abdominal distention, no abdominal pain, no change in bowel habits hematochezia or melena. Genitourinary: No dysuria, no frequency, no urgency, no nocturia. Musculoskeletal:No arthralgias, no back pain, no gait disturbance or myalgias. Neurological: No dizziness, no headaches, no numbness, no seizures, no syncope, no weakness, no tremors. Hematologic: No lymphadenopathy, no easy bruising. Psychiatric: No confusion, no hallucinations, no sleep disturbance.    Physical Exam: Filed Vitals:   05/12/12 1149  BP: 110/68  Pulse: 66   the general appearance reveals a thin woman in no distress.  She has lost 3 pounds since last visit.The head and neck exam reveals pupils equal and reactive.  Extraocular movements are full.  There is no scleral icterus.  The mouth and pharynx are normal.  The neck is supple.  The carotids reveal no bruits.  The jugular venous pressure is normal.  The  thyroid is not enlarged.  There is no lymphadenopathy.  The chest is clear to percussion and auscultation.  There are no rales or rhonchi.  Expansion of the chest is symmetrical.  The precordium is quiet.  The first heart sound is normal.  The second heart sound is physiologically split.  There is no murmur gallop rub or click.  There is no abnormal lift or heave.  The abdomen is soft and nontender.  The bowel sounds are normal.  The liver and spleen are not enlarged.  There are no abdominal masses.  There are no abdominal bruits.  Extremities reveal good pedal pulses.  There is no phlebitis or edema.  There is no cyanosis or clubbing.  Strength is normal and symmetrical in all extremities.  There is no lateralizing weakness.  There are no sensory deficits.  The skin is warm and dry.  There is no rash.     Assessment / Plan: I have  encouraged her to try to some protein supplements to gain weight. We are decreasing her beta blocker to see if Raynaud's will improve. We are obtaining noncontrast CT of the chest to evaluate her nodule in the right lung base. Blood work pending today.  Recheck 4 months for followup office visit lipid panel hepatic function panel and basal metabolic panel

## 2012-06-10 ENCOUNTER — Encounter: Payer: Self-pay | Admitting: Gastroenterology

## 2012-06-14 ENCOUNTER — Other Ambulatory Visit (INDEPENDENT_AMBULATORY_CARE_PROVIDER_SITE_OTHER): Payer: Medicare Other

## 2012-06-14 DIAGNOSIS — Z789 Other specified health status: Secondary | ICD-10-CM

## 2012-06-14 DIAGNOSIS — IMO0001 Reserved for inherently not codable concepts without codable children: Secondary | ICD-10-CM

## 2012-06-14 LAB — BASIC METABOLIC PANEL
GFR: 119.9 mL/min (ref >60.00)
Glucose, Bld: 90 mg/dL (ref 70–99)
Potassium: 3.9 mEq/L (ref 3.5–5.1)
Sodium: 135 mEq/L (ref 135–145)

## 2012-06-14 NOTE — Progress Notes (Signed)
Quick Note:  Please report to patient. The recent labs are stable. Continue same medication and careful diet. Sodium is back up to normal. ______

## 2012-06-15 ENCOUNTER — Telehealth: Payer: Self-pay | Admitting: *Deleted

## 2012-06-15 NOTE — Telephone Encounter (Signed)
Advised patient of labs   Stated she is going to increase her salt but suffered a great deal with her fluid restrictions and is going to increase some

## 2012-06-15 NOTE — Telephone Encounter (Signed)
Message copied by Burnell Blanks on Wed Jun 15, 2012  4:24 PM ------      Message from: Jenny Torres      Created: Tue Jun 14, 2012  8:18 PM       Please report to patient.  The recent labs are stable. Continue same medication and careful diet. Sodium is back up to normal.

## 2012-06-15 NOTE — Telephone Encounter (Signed)
Agree with plan 

## 2012-08-10 ENCOUNTER — Telehealth: Payer: Self-pay | Admitting: Cardiology

## 2012-08-10 DIAGNOSIS — G4700 Insomnia, unspecified: Secondary | ICD-10-CM

## 2012-08-10 NOTE — Telephone Encounter (Signed)
Pt needs ambien refill, uses [facility]

## 2012-08-11 MED ORDER — ZOLPIDEM TARTRATE ER 12.5 MG PO TBCR: 12.5000 mg | EXTENDED_RELEASE_TABLET | Freq: Every day | ORAL | Status: DC

## 2012-08-11 NOTE — Telephone Encounter (Signed)
Will fax to [facility]

## 2012-08-12 ENCOUNTER — Telehealth: Payer: Self-pay | Admitting: *Deleted

## 2012-08-12 NOTE — Telephone Encounter (Signed)
Patient phoned regarding Rx.  Patient stated she gets depressed this time every year but she would just discuss with  Dr. Patty Sermons at her appointment next month.  Stated her Lorazepam helps her. Did offer to discuss with  Dr. Patty Sermons she insisted on just discussing with him later.  Advised if she changed her mind to call back

## 2012-09-08 ENCOUNTER — Encounter: Payer: Self-pay | Admitting: Cardiology

## 2012-09-08 ENCOUNTER — Ambulatory Visit (INDEPENDENT_AMBULATORY_CARE_PROVIDER_SITE_OTHER): Payer: Medicare Other | Admitting: Cardiology

## 2012-09-08 VITALS — BP 124/76 | HR 60 | Ht 64.0 in | Wt 94.0 lb

## 2012-09-08 DIAGNOSIS — G629 Polyneuropathy, unspecified: Secondary | ICD-10-CM

## 2012-09-08 DIAGNOSIS — R9389 Abnormal findings on diagnostic imaging of other specified body structures: Secondary | ICD-10-CM

## 2012-09-08 DIAGNOSIS — R918 Other nonspecific abnormal finding of lung field: Secondary | ICD-10-CM

## 2012-09-08 DIAGNOSIS — E785 Hyperlipidemia, unspecified: Secondary | ICD-10-CM

## 2012-09-08 DIAGNOSIS — G609 Hereditary and idiopathic neuropathy, unspecified: Secondary | ICD-10-CM

## 2012-09-08 DIAGNOSIS — R042 Hemoptysis: Secondary | ICD-10-CM

## 2012-09-08 DIAGNOSIS — E7800 Pure hypercholesterolemia, unspecified: Secondary | ICD-10-CM

## 2012-09-08 LAB — BASIC METABOLIC PANEL
Chloride: 96 mEq/L (ref 96–112)
GFR: 119.83 mL/min (ref >60.00)
Potassium: 3.8 mEq/L (ref 3.5–5.1)
Sodium: 133 mEq/L — ABNORMAL LOW (ref 135–145)

## 2012-09-08 LAB — LDL CHOLESTEROL, DIRECT: Direct LDL: 133 mg/dL

## 2012-09-08 LAB — LIPID PANEL
Cholesterol: 219 mg/dL — ABNORMAL HIGH (ref 0–200)
HDL: 78.9 mg/dL (ref >39.00)
Triglycerides: 56 mg/dL (ref 0.0–149.0)
VLDL: 11.2 mg/dL (ref 0.0–40.0)

## 2012-09-08 LAB — HEPATIC FUNCTION PANEL
Albumin: 4.1 g/dL (ref 3.5–5.2)
Total Protein: 7 g/dL (ref 6.0–8.3)

## 2012-09-08 MED ORDER — CYANOCOBALAMIN 1000 MCG/ML IJ SOLN: 10.0000 ug | INTRAMUSCULAR | Status: DC

## 2012-09-08 NOTE — Patient Instructions (Addendum)
Your physician recommends that you continue on your current medications as directed. Please refer to the Current Medication list given to you today.  Your physician wants you to follow-up in: 4 months with fasting labs (lp/bmet/hfp) You will receive a reminder letter in the mail two months in advance. If you don't receive a letter, please call our office to schedule the follow-up appointment.   Will obtain labs today and call you with the results (LP/BMET/HFP)

## 2012-09-08 NOTE — Assessment & Plan Note (Signed)
The patient has had no further hemoptysis.  She is not having any active pulmonary issues at this time.

## 2012-09-08 NOTE — Progress Notes (Signed)
Quick Note:  Please report to patient. The recent labs are stable. Continue same medication and careful diet. The cholesterol and LDL are slightly higher today. Liver function studies are normal. The serum sodium is still a little below and she could benefit from adding just a little more salt to her food ______

## 2012-09-08 NOTE — Progress Notes (Signed)
Jenny Torres Date of Birth:  1928-01-28 [facility] [address] [place], [place]  [address] [phone]         Fax   [phone]  History of Present Illness: This pleasant 78 year old woman is seen for a scheduled four-month followup office visit. He has a past history of palpitations and mitral valve prolapse. Also has a history of essential hypertension and hypercholesterolemia. She has a history of Raynaud's disease. She has had situational depression related to ongoing stress regarding her husband's dementia. She has a history of allergies and past history of asthma.  In August 2013 the patient had an episode of hemoptysis.  She had a previous episode 50 years earlier and at that time had been told that she had a scar on her lung.  In August 2013 she had a chest x-ray and then a CT scan of the chest which showed no definite cause for the hemoptysis.  There was some peribronchial nodularity and a repeat CT was recommended for 3-4 months subsequently to assure stability.   Current Outpatient Prescriptions  Medication Sig Dispense Refill  . albuterol (PROVENTIL HFA;VENTOLIN HFA) 108 (90 BASE) MCG/ACT inhaler Inhale 2 puffs into the lungs every 6 (six) hours as needed for wheezing.  1 Inhaler  0  . aspirin 81 MG tablet Take 81 mg by mouth daily.        . fluocinonide (LIDEX) 0.05 % cream Apply topically 2 (two) times daily. As directed by dermatologist       . fluticasone (FLONASE) 50 MCG/ACT nasal spray Place 1 spray into the nose daily.  16 g  5  . gabapentin (NEURONTIN) 800 MG tablet TAKE 1 TABLET THREE TIMES A DAY AS NEEDED  270 tablet  3  . guaiFENesin (MUCINEX) 600 MG 12 hr tablet Take 1 tablet (600 mg total) by mouth 2 (two) times daily.  60 tablet  2  . LORazepam (ATIVAN) 1 MG tablet 1/2 to 1 tablet daily as needed  60 tablet  2  . metoprolol tartrate (LOPRESSOR) 25 MG tablet Take 25 mg by mouth as directed. 1/2 tablet twice a day      . Multiple  Vitamins-Minerals (MULTIVITAL) tablet Take 1 tablet by mouth daily.        . ondansetron (ZOFRAN ODT) 8 MG disintegrating tablet Take 1 tablet (8 mg total) by mouth as needed.  10 tablet  1  . Psyllium (METAMUCIL) 30.9 % POWD Take by mouth daily.        Marland Kitchen ZETIA 10 MG tablet TAKE 1 TABLET DAILY  90 tablet  3  . zolpidem (AMBIEN CR) 12.5 MG CR tablet Take 1 tablet (12.5 mg total) by mouth at bedtime.  90 tablet  1  . cyanocobalamin (,VITAMIN B-12,) 1000 MCG/ML injection Inject 0.01 mLs (10 mcg total) into the muscle as directed.  10 mL  1    Allergies  Allergen Reactions  . Sulfamethoxazole Swelling    Swelling of the throat, mouth, tongue, nose  . Ceftin     GI problems  . Darvocet (Propoxyphene-Acetaminophen) Itching  . Erythromycin     Upset stomach  . Hydrocod Polst-Cpm Polst Er     nausea  . Lovastatin   . Paroxetine Hcl     jittery  . Penicillins   . Theophyllines     Patient Active Problem List  Diagnosis  . POLYP, COLON  . HYPERLIPIDEMIA  . ANXIETY  . DEPRESSION  . UNSPECIFIED RETINAL VASCULAR OCCLUSION  .  HYPERTENSION  . EXTERNAL HEMORRHOIDS  . COPD  . GASTROESOPHAGEAL REFLUX DISEASE  . GASTRITIS  . DIVERTICULOSIS, COLON  . ABDOMINAL PAIN, GENERALIZED  . Raynaud's disease /phenomenon  . Idiopathic peripheral neuropathy  . MVP (mitral valve prolapse)  . Palpitations  . Osteoporosis  . Hemoptysis    History  Smoking status  . Former Smoker  Smokeless tobacco  . Not on file    History  Alcohol Use  . Yes    Family History  Problem Relation Age of Onset  . Breast cancer Mother   . Liver disease Brother   . Cirrhosis Brother     Review of Systems: Constitutional: no fever chills diaphoresis or fatigue or change in weight.  Head and neck: no hearing loss, no epistaxis, no photophobia or visual disturbance. Respiratory: No cough, shortness of breath or wheezing. Cardiovascular: No chest pain peripheral edema, palpitations. Gastrointestinal: No  abdominal distention, no abdominal pain, no change in bowel habits hematochezia or melena. Genitourinary: No dysuria, no frequency, no urgency, no nocturia. Musculoskeletal:No arthralgias, no back pain, no gait disturbance or myalgias. Neurological: No dizziness, no headaches, no numbness, no seizures, no syncope, no weakness, no tremors. Hematologic: No lymphadenopathy, no easy bruising. Psychiatric: No confusion, no hallucinations, no sleep disturbance.    Physical Exam: Filed Vitals:   09/08/12 1127  BP: 124/76  Pulse: 60   general appearance reveals a thin elderly woman in no acute distress.  She is pleased that she has gained 3 pounds since last visit.The head and neck exam reveals pupils equal and reactive.  Extraocular movements are full.  There is no scleral icterus.  The mouth and pharynx are normal.  The neck is supple.  The carotids reveal no bruits.  The jugular venous pressure is normal.  The  thyroid is not enlarged.  There is no lymphadenopathy.  The chest is clear to percussion and auscultation.  There are no rales or rhonchi.  Expansion of the chest is symmetrical.  The precordium is quiet.  The first heart sound is normal.  The second heart sound is physiologically split.  There is a soft apical midsystolic click.  There is no abnormal lift or heave.  The abdomen is soft and nontender.  The bowel sounds are normal.  The liver and spleen are not enlarged.  There are no abdominal masses.  There are no abdominal bruits.  Extremities reveal good pedal pulses.  There is no phlebitis or edema.  There is no cyanosis or clubbing.  Strength is normal and symmetrical in all extremities.  There is no lateralizing weakness.  There are no sensory deficits.  The skin is warm and dry.  There is no rash.  EKG shows sinus bradycardia and is otherwise within normal limits.   Assessment / Plan: Continue same medication.  We'll discuss followup CT of the chest with patient.  Blood work today is  pending.  Recheck in 4 months for followup office visit lipid panel hepatic function panel and basal metabolic panel.

## 2012-09-08 NOTE — Assessment & Plan Note (Signed)
The patient has idiopathic peripheral neuropathy.  It may have been aggravated in the past by statins also.  She is also developing some numbness in her hands and numbness in her legs has extended to be on the knee bilaterally.  She had been placed on vitamin B12 shots empirically and she feels that the vitamin B12 shots have helped her neuropathy slightly.  We are calling in a refill on her vitamin B12 shots today and she can take thousand micrograms monthly.

## 2012-09-08 NOTE — Assessment & Plan Note (Signed)
Patient has a history of hyperlipidemia.  She attributes her symptoms of peripheral neuropathy to her prior taking of statins.  She presently is on ezetimibe 10 mg daily for hypercholesterolemia.  Blood work today is pending.

## 2012-09-19 ENCOUNTER — Telehealth: Payer: Self-pay | Admitting: *Deleted

## 2012-09-19 NOTE — Telephone Encounter (Signed)
Without contrast

## 2012-09-19 NOTE — Telephone Encounter (Signed)
Message copied by Burnell Blanks on Mon Sep 19, 2012  7:06 PM ------      Message from: Cassell Clement      Created: Thu Sep 08, 2012  4:49 PM       Please report to patient.  The recent labs are stable. Continue same medication and careful diet.  The cholesterol and LDL are slightly higher today.  Liver function studies are normal.  The serum sodium is still a little below and she could benefit from adding just a little more salt to her food

## 2012-09-19 NOTE — Telephone Encounter (Signed)
Advised and will arrange for CT of chest (follow up chest xray)

## 2012-09-22 NOTE — Telephone Encounter (Signed)
Follow-up:    Patient called in and was instructed to ask to speak with you.  Please call back.

## 2012-09-22 NOTE — Telephone Encounter (Signed)
I see the order in the system for ct without/ i will forward a note to [facility] pool to set app.

## 2012-09-23 ENCOUNTER — Telehealth: Payer: Self-pay | Admitting: *Deleted

## 2012-09-23 NOTE — Telephone Encounter (Signed)
[facility] was attempting to set up patient for Chest CT and she wanted clarification concerning why she needed another Chest CT. Advised her that this is a follow up scan from the August CT scan per Dr.Brackbill. She agreed to set up scan with Jasmine December.

## 2012-09-26 ENCOUNTER — Telehealth: Payer: Self-pay | Admitting: Cardiology

## 2012-09-26 NOTE — Telephone Encounter (Signed)
Left message to call back  

## 2012-09-26 NOTE — Telephone Encounter (Signed)
New Problem:    Patient called in wanting to speak with you about a medication that was declined by [facility] and another thing.  Please call back.

## 2012-09-26 NOTE — Telephone Encounter (Signed)
Left message to call back on another open telephone encounter from 09/26/12

## 2012-09-26 NOTE — Telephone Encounter (Signed)
Vitamin B12 not covered called to Jenny Torres as requested

## 2012-09-26 NOTE — Telephone Encounter (Signed)
Pt rtn call to melinda °

## 2012-09-27 ENCOUNTER — Other Ambulatory Visit: Payer: Medicare Other

## 2012-09-30 NOTE — Telephone Encounter (Signed)
F/u   Patient did not want to disclose any information stating nurse is aware of situation.

## 2012-09-30 NOTE — Telephone Encounter (Signed)
Advised patient of appointment date and time for CT  Called Vitamin B12 to Leonie Douglas on 09/28/12

## 2012-10-06 ENCOUNTER — Ambulatory Visit (INDEPENDENT_AMBULATORY_CARE_PROVIDER_SITE_OTHER)
Admission: RE | Admit: 2012-10-06 | Discharge: 2012-10-06 | Disposition: A | Discharge status: Home / Self Care | Payer: Medicare Other | Source: Ambulatory Visit | Attending: Cardiology | Admitting: Cardiology

## 2012-10-06 DIAGNOSIS — R9389 Abnormal findings on diagnostic imaging of other specified body structures: Secondary | ICD-10-CM

## 2012-10-11 ENCOUNTER — Telehealth: Payer: Self-pay | Admitting: *Deleted

## 2012-10-11 DIAGNOSIS — R9389 Abnormal findings on diagnostic imaging of other specified body structures: Secondary | ICD-10-CM

## 2012-10-11 NOTE — Telephone Encounter (Signed)
Message copied by Burnell Blanks on Tue Oct 11, 2012  8:45 AM ------      Message from: Cassell Clement      Created: Sun Oct 09, 2012  8:45 PM       Please report. The pulmonary nodule is smaller. However the lungs raise the question of atypical mycobacterial infection.  I don't believe she has a pulmonologist. I would like her to see Dr. Fannie Knee or partners to see if any therapy indicated at this time.

## 2012-10-11 NOTE — Telephone Encounter (Signed)
Advised patient and called schedule appointment   Dr Delton Coombes on October 22, 2011 at 11:15 arrive 11:00  Advised patient of appointment

## 2012-10-21 ENCOUNTER — Ambulatory Visit (INDEPENDENT_AMBULATORY_CARE_PROVIDER_SITE_OTHER): Payer: Medicare Other | Admitting: Emergency Medicine

## 2012-10-21 ENCOUNTER — Telehealth: Payer: Self-pay | Admitting: Cardiology

## 2012-10-21 ENCOUNTER — Encounter: Payer: Self-pay | Admitting: Emergency Medicine

## 2012-10-21 ENCOUNTER — Other Ambulatory Visit: Payer: Medicare Other

## 2012-10-21 VITALS — BP 150/98 | HR 67 | Temp 97.1°F | Ht 63.0 in | Wt 93.6 lb

## 2012-10-21 DIAGNOSIS — R911 Solitary pulmonary nodule: Secondary | ICD-10-CM

## 2012-10-21 NOTE — Telephone Encounter (Signed)
New Problem    Wanted to speak to [facility] about Dr. Patty Sermons referring her to another [facility] physician.

## 2012-10-21 NOTE — Assessment & Plan Note (Addendum)
Very interesting case - RML nodule that is stable in size over 6 months in former smoker. Could represent CA, TB or atypical mycobacterial disease. She has hx of PPD positive and some acute illnesses in her past that sound like possible active TB?? These were many years ago in her teens and her 53's. She was never treated with INH to her knowledge. Not clear to me that I can get records from 50 yrs ago [facility]). In absence of cough or other sx, I think if he quant gold testing is positive, she probably needs to be treated with INH. This may be challenging since she has not tolerated other abx in the past. We will follow the CT scan in 6 months since we still need to do surveillance for possible malignancy.

## 2012-10-21 NOTE — Telephone Encounter (Signed)
Just called to thank [place] for sending her to Dr Delton Coombes

## 2012-10-21 NOTE — Progress Notes (Signed)
Subjective:    Patient ID: Jenny Torres, female    DOB: 05/21/1928, 79 y.o.   MRN: [id number]  HPI 79 yo woman, former smoker (25 pk-yrs) followed by Dr Patty Sermons for HTN, MV prolapse, hx PPD positive. She underwent a CXR Summer '13, prompted a CT scan to eval in 8/13 and then repeat 1/14. The scan shows some biapical scar and a discrete RML nodule that has slightly decreased in size since 8/13. She doesn't recall ever being treated with INH. She does recall having the positive PPD in her 30's and being admitted for PNA x 1 week, but she never took an abx for 6 months. She had a severe PNA at age 79. She denies fever, chills, sweats, cough. Feels well.    Review of Systems  Constitutional: Positive for appetite change. Negative for fever.  HENT: Positive for congestion. Negative for ear pain, nosebleeds, sore throat, rhinorrhea, sneezing, trouble swallowing, dental problem, postnasal drip and sinus pressure.   Eyes: Negative for redness and itching.  Respiratory: Negative for cough, chest tightness, shortness of breath and wheezing.   Cardiovascular: Negative for palpitations and leg swelling.  Gastrointestinal: Negative for nausea and vomiting.  Genitourinary: Negative for dysuria.  Musculoskeletal: Negative for joint swelling.  Skin: Negative for rash.  Neurological: Negative for headaches.  Hematological: Does not bruise/bleed easily.  Psychiatric/Behavioral: Negative for dysphoric mood. The patient is nervous/anxious.    Past Medical History  Diagnosis Date  . Anxiety   . Depression   . COPD (chronic obstructive pulmonary disease)   . HTN (hypertension)   . Gastritis   . Polyp of colon   . Hemorrhoids, external   . Diverticula, colon   . GERD (gastroesophageal reflux disease)   . Carotid artery occlusion   . Idiopathic peripheral neuropathy     chronic  . Hyperlipidemia   . MVP (mitral valve prolapse)     stable  . Palpitations   . Osteoporosis   . Hyperlipidemia       Family History  Problem Relation Age of Onset  . Breast cancer Mother   . Liver disease Brother   . Cirrhosis Brother      History   Social History  . Marital Status: Married    Spouse Name: N/A    Number of Children: 2  . Years of Education: N/A   Occupational History  . caregiver    Social History Main Topics  . Smoking status: Former Smoker -- 1.0 packs/day for 25 years    Types: Cigarettes    Quit date: 09/21/1982  . Smokeless tobacco: Never Used  . Alcohol Use: Yes     Comment: rarely  . Drug Use: No  . Sexually Active: Not on file   Other Topics Concern  . Not on file   Social History Narrative   Retired.   Formerly worked as a Geologist, engineering No known TB exposure.   Allergies  Allergen Reactions  . Sulfamethoxazole Swelling    Swelling of the throat, mouth, tongue, nose  . Ceftin     GI problems  . Darvocet (Propoxyphene-Acetaminophen) Itching  . Erythromycin     Upset stomach  . Hydrocod Polst-Cpm Polst Er     nausea  . Lovastatin   . Paroxetine Hcl     jittery  . Penicillins   . Theophyllines      Outpatient Prescriptions Prior to Visit  Medication Sig Dispense Refill  . albuterol (PROVENTIL HFA;VENTOLIN HFA) 108 (90 BASE) MCG/ACT inhaler Inhale  2 puffs into the lungs every 6 (six) hours as needed for wheezing.  1 Inhaler  0  . aspirin 81 MG tablet Take 81 mg by mouth daily.        . fluocinonide (LIDEX) 0.05 % cream Apply topically 2 (two) times daily. As directed by dermatologist       . fluticasone (FLONASE) 50 MCG/ACT nasal spray Place 1 spray into the nose daily.  16 g  5  . gabapentin (NEURONTIN) 800 MG tablet TAKE 1 TABLET THREE TIMES A DAY AS NEEDED  270 tablet  3  . guaiFENesin (MUCINEX) 600 MG 12 hr tablet Take 1 tablet (600 mg total) by mouth 2 (two) times daily.  60 tablet  2  . LORazepam (ATIVAN) 1 MG tablet 1/2 to 1 tablet daily as needed  60 tablet  2  . metoprolol tartrate (LOPRESSOR) 25 MG tablet Take 25 mg by mouth as  directed. 1/2 tablet twice a day      . Multiple Vitamins-Minerals (MULTIVITAL) tablet Take 1 tablet by mouth daily.        . ondansetron (ZOFRAN ODT) 8 MG disintegrating tablet Take 1 tablet (8 mg total) by mouth as needed.  10 tablet  1  . Psyllium (METAMUCIL) 30.9 % POWD Take by mouth daily.        Marland Kitchen ZETIA 10 MG tablet TAKE 1 TABLET DAILY  90 tablet  3  . zolpidem (AMBIEN CR) 12.5 MG CR tablet Take 1 tablet (12.5 mg total) by mouth at bedtime.  90 tablet  1  . cyanocobalamin (,VITAMIN B-12,) 1000 MCG/ML injection Inject 0.01 mLs (10 mcg total) into the muscle as directed.  10 mL  1  Last reviewed on 10/21/2012 11:13 AM by Lazarus Salines, RN       Objective:   Physical Exam Filed Vitals:   10/21/12 1114  BP: 150/98  Pulse: 67  Temp: 97.1 F (36.2 C)   Gen: Pleasant, well-nourished, in no distress,  normal affect  ENT: No lesions,  mouth clear,  oropharynx clear, no postnasal drip  Neck: No JVD, no TMG, no carotid bruits  Lungs: No use of accessory muscles, no dullness to percussion, clear without rales or rhonchi  Cardiovascular: RRR, heart sounds normal, no murmur or gallops, no peripheral edema  Abdomen: soft and NT, no HSM,  BS normal  Musculoskeletal: No deformities, no cyanosis or clubbing  Neuro: alert, non focal  Skin: Warm, no lesions or rashes    CT chest 10/11/12 --  Comparison: Chest CT 05/12/2012.  Findings:  Mediastinum: Heart size is normal. There is no significant  pericardial fluid, thickening or pericardial calcification. There  is atherosclerosis of the thoracic aorta, the great vessels of the  mediastinum and the coronary arteries, including calcified  atherosclerotic plaque in the left main, left anterior descending,  circumflex and right coronary arteries. No pathologically enlarged  mediastinal or hilar lymph nodes. Please note that accurate  exclusion of hilar adenopathy is limited on noncontrast CT scans.  Numerous calcified right  hilar and mediastinal lymph nodes are  noted, likely related to old granulomatous disease. The esophagus  is unremarkable in appearance.  Lungs/Pleura: The dominant nodular opacity noted on the prior  examination in the right middle lobe has decreased in size,  measuring approximately 11 x 7 mm on today's study, and appears  significantly less bulging on the prior examination. There  continues to be some patchy areas of mild bronchial wall thickening  with some thickening  of the peribronchovascular interstitium, focal  areas of architectural distortion, mild cylindrical bronchiectasis  and peribronchovascular micronodularity, most pronounced in the  inferior segment of the lingula and right middle lobe, with  additional patchy involvement of the lungs bilaterally. The  overall appearance is similar to the prior study, most compatible  with a chronic indolent atypical infectious process such as MAI.  Calcified bilateral pleural plaques are also noted, most pronounced  in the upper thorax bilaterally. No acute consolidative airspace  disease. No pleural effusions.  Upper Abdomen: Multiple calcifications in the liver and spleen  likely represent calcified granulomas.  Musculoskeletal: Multiple old healed bilateral rib fractures are  noted. There are no aggressive appearing lytic or blastic lesions  noted in the visualized portions of the skeleton.  IMPRESSION:  1. No definite suspicious appearing pulmonary nodules or masses  are identified.  2. The appearance of the lungs is most suggestive of a chronic  indolent atypical infectious process such as Mycobacterium avium-  intracellulare (MAI). The dominant nodular opacity in the right  middle lobe noted on the prior examination is smaller on today's  study, most compatible with an infectious or inflammatory process.  The other smaller nodules in the lungs are most compatible with  areas of chronic mucoid impaction within terminal  bronchioles.  3. Calcified bilateral pleural plaques. This may be a sign of  asbestos related pleural disease, or could be related to prior  bilateral pleural infection or bilateral pleural hemorrhage. At  this time, there are no findings to suggest interstitial lung  disease such as asbestosis in the lungs.  4. Atherosclerosis, including left main and three-vessel coronary  artery disease.  5. Sequelae of old granulomatous disease, as above.      Assessment & Plan:  Pulmonary nodule Very interesting case - RML nodule that is stable in size over 6 months in former smoker. Could represent CA, TB or atypical mycobacterial disease. She has hx of PPD positive and some acute illnesses in her past that sound like possible active TB?? These were many years ago in her teens and her 78's. She was never treated with INH to her knowledge. Not clear to me that I can get records from 50 yrs ago [facility]). In absence of cough or other sx, I think if he quant gold testing is positive, she probably needs to be treated with INH. This may be challenging since she has not tolerated other abx in the past. We will follow the CT scan in 6 months since we still need to do surveillance for possible malignancy.

## 2012-10-21 NOTE — Patient Instructions (Addendum)
We will perform blood work today Follow with Dr Delton Coombes next available opening We will need to repeat your CT scan of the chest in July 2014

## 2012-10-24 ENCOUNTER — Ambulatory Visit (INDEPENDENT_AMBULATORY_CARE_PROVIDER_SITE_OTHER): Payer: Medicare Other | Admitting: Emergency Medicine

## 2012-10-24 ENCOUNTER — Encounter: Payer: Self-pay | Admitting: Emergency Medicine

## 2012-10-24 VITALS — BP 110/74 | HR 66 | Temp 97.2°F | Ht 63.0 in | Wt 96.4 lb

## 2012-10-24 DIAGNOSIS — R911 Solitary pulmonary nodule: Secondary | ICD-10-CM

## 2012-10-24 NOTE — Progress Notes (Signed)
Subjective:    Patient ID: Jenny Torres, female    DOB: 04/24/1928, 79 y.o.   MRN: [id number]  HPI 79 yo woman, former smoker (25 pk-yrs) followed by Dr Patty Sermons for HTN, MV prolapse, hx PPD positive. She underwent a CXR Summer '13, prompted a CT scan to eval in 8/13 and then repeat 1/14. The scan shows some biapical scar and a discrete RML nodule that has slightly decreased in size since 8/13. She doesn't recall ever being treated with INH. She does recall having the positive PPD in her 30's and being admitted for PNA x 1 week, but she never took an abx for 6 months. She had a severe PNA at age 79. She denies fever, chills, sweats, cough. Feels well.   ROV 10/24/12 -- Returns for f/u. Quant Gold test was sent and is still pending. Need the results to decide whether we will rx with INH. Plan for CT scan after 6 months to follow the nodule     Objective:   Physical Exam Filed Vitals:   10/24/12 1334  BP: 110/74  Pulse: 66  Temp: 97.2 F (36.2 C)   Exam not performed today  CT chest 10/11/12 --  Comparison: Chest CT 05/12/2012.  Findings:  Mediastinum: Heart size is normal. There is no significant  pericardial fluid, thickening or pericardial calcification. There  is atherosclerosis of the thoracic aorta, the great vessels of the  mediastinum and the coronary arteries, including calcified  atherosclerotic plaque in the left main, left anterior descending,  circumflex and right coronary arteries. No pathologically enlarged  mediastinal or hilar lymph nodes. Please note that accurate  exclusion of hilar adenopathy is limited on noncontrast CT scans.  Numerous calcified right hilar and mediastinal lymph nodes are  noted, likely related to old granulomatous disease. The esophagus  is unremarkable in appearance.  Lungs/Pleura: The dominant nodular opacity noted on the prior  examination in the right middle lobe has decreased in size,  measuring approximately 11 x 7 mm on today's study,  and appears  significantly less bulging on the prior examination. There  continues to be some patchy areas of mild bronchial wall thickening  with some thickening of the peribronchovascular interstitium, focal  areas of architectural distortion, mild cylindrical bronchiectasis  and peribronchovascular micronodularity, most pronounced in the  inferior segment of the lingula and right middle lobe, with  additional patchy involvement of the lungs bilaterally. The  overall appearance is similar to the prior study, most compatible  with a chronic indolent atypical infectious process such as MAI.  Calcified bilateral pleural plaques are also noted, most pronounced  in the upper thorax bilaterally. No acute consolidative airspace  disease. No pleural effusions.  Upper Abdomen: Multiple calcifications in the liver and spleen  likely represent calcified granulomas.  Musculoskeletal: Multiple old healed bilateral rib fractures are  noted. There are no aggressive appearing lytic or blastic lesions  noted in the visualized portions of the skeleton.  IMPRESSION:  1. No definite suspicious appearing pulmonary nodules or masses  are identified.  2. The appearance of the lungs is most suggestive of a chronic  indolent atypical infectious process such as Mycobacterium avium-  intracellulare (MAI). The dominant nodular opacity in the right  middle lobe noted on the prior examination is smaller on today's  study, most compatible with an infectious or inflammatory process.  The other smaller nodules in the lungs are most compatible with  areas of chronic mucoid impaction within terminal bronchioles.  3. Calcified  bilateral pleural plaques. This may be a sign of  asbestos related pleural disease, or could be related to prior  bilateral pleural infection or bilateral pleural hemorrhage. At  this time, there are no findings to suggest interstitial lung  disease such as asbestosis in the lungs.  4.  Atherosclerosis, including left main and three-vessel coronary  artery disease.  5. Sequelae of old granulomatous disease, as above.      Assessment & Plan:  Pulmonary nodule Still waiting for Quant Gold results. Will not charge her for an OV today. Will call her when the results are available, then make the plan about the INH or other rx for TB. We will repeat the CT scan in 6 months

## 2012-10-24 NOTE — Assessment & Plan Note (Signed)
Still waiting for Quant Gold results. Will not charge her for an OV today. Will call her when the results are available, then make the plan about the INH or other rx for TB. We will repeat the CT scan in 6 months

## 2012-10-25 LAB — QUANTIFERON TB GOLD ASSAY (BLOOD): Interferon Gamma Release Assay: NEGATIVE

## 2012-10-31 NOTE — Progress Notes (Signed)
Quick Note:  Spoke with patient, informed her of results as listed below per Dr. Delton Coombes. Patient verbalized understanding, would like to the plans from here and if Dr. Delton Coombes would like a follow up with her.   Per last OV: 10/24/12 Still waiting for Quant Gold results. Will not charge her for an OV today. Will call her when the results are available, then make the plan about the INH or other rx for TB. We will repeat the CT scan in 6 months    Dr. Delton Coombes please advise. ______

## 2012-11-02 NOTE — Progress Notes (Signed)
Quick Note:  Appt scheduled for Friday 11/25/12 at 430pm. Nothing further needed at this time. ______

## 2012-11-25 ENCOUNTER — Ambulatory Visit: Payer: Medicare Other | Admitting: Emergency Medicine

## 2012-11-29 ENCOUNTER — Telehealth: Payer: Self-pay | Admitting: Gastroenterology

## 2012-11-29 NOTE — Telephone Encounter (Signed)
Pt reports she had a burgundy colored stool this afternoon and it scared her. She reports the blood was on the TP as well as in the bowl. She also reports abdominal pain below her waist across the lower area. She did take a laxative last night but she states that had nothing to do with it. \she reports a lot of gas lately and nausea at times in the am. She has ondansetron from Dr Patty Sermons and takes that PRN. She is an old Dr Doreatha Martin pt , but saw Dr Jarold Motto once in 2009 for hemorrhoids and she did f/u in 2 weeks. I did ask if she thought the bleeding was d/t hemorrhoids and she stated no. Pt will see Mike Gip , PA tomorrow.

## 2012-11-30 ENCOUNTER — Other Ambulatory Visit (INDEPENDENT_AMBULATORY_CARE_PROVIDER_SITE_OTHER): Payer: Medicare Other

## 2012-11-30 ENCOUNTER — Ambulatory Visit (INDEPENDENT_AMBULATORY_CARE_PROVIDER_SITE_OTHER): Payer: Medicare Other | Admitting: Physician Assistant

## 2012-11-30 ENCOUNTER — Encounter: Payer: Self-pay | Admitting: Physician Assistant

## 2012-11-30 VITALS — BP 130/90 | HR 68 | Ht 63.0 in | Wt 92.0 lb

## 2012-11-30 DIAGNOSIS — R110 Nausea: Secondary | ICD-10-CM

## 2012-11-30 DIAGNOSIS — K55059 Acute (reversible) ischemia of intestine, part and extent unspecified: Secondary | ICD-10-CM

## 2012-11-30 DIAGNOSIS — K55039 Acute (reversible) ischemia of large intestine, extent unspecified: Secondary | ICD-10-CM

## 2012-11-30 DIAGNOSIS — R109 Unspecified abdominal pain: Secondary | ICD-10-CM

## 2012-11-30 DIAGNOSIS — K625 Hemorrhage of anus and rectum: Secondary | ICD-10-CM

## 2012-11-30 LAB — CBC WITH DIFFERENTIAL/PLATELET
Basophils Absolute: 0 10*3/uL (ref 0.0–0.1)
Basophils Relative: 0.3 % (ref 0.0–3.0)
Hemoglobin: 13.9 g/dL (ref 12.0–15.0)
Lymphocytes Relative: 12.5 % (ref 12.0–46.0)
Monocytes Relative: 6.2 % (ref 3.0–12.0)
Neutro Abs: 11.2 10*3/uL — ABNORMAL HIGH (ref 1.4–7.7)
Neutrophils Relative %: 80.7 % — ABNORMAL HIGH (ref 43.0–77.0)
RBC: 4.56 Mil/uL (ref 3.87–5.11)

## 2012-11-30 MED ORDER — PROMETHAZINE HCL 12.5 MG PO TABS: 12.5000 mg | ORAL_TABLET | Freq: Three times a day (TID) | ORAL | Status: DC | PRN

## 2012-11-30 NOTE — Progress Notes (Signed)
I agree with Dx. And Rx.

## 2012-11-30 NOTE — Progress Notes (Signed)
Subjective:    Patient ID: Jenny Torres, female    DOB: 28-Apr-1928, 79 y.o.   MRN: [id number]  HPI [name] is a very nice 79 year old white female known to Dr. Jarold Motto and previously to Dr. Terrial Rhodes  She had undergone colonoscopy in 2005 which showed left-sided diverticulosis from the splenic flexure to the sigmoid colon as well as external hemorrhoids. She has not been in the office in the past few years. She has history of peripheral neuropathy which is her main limiting factor, and also has hyperlipidemia and hypertension. She says she has intermittent problems with mild constipation but generally does not take laxatives. She does take Metamucil on a daily basis . She had onset of her current symptoms yesterday morning waking her from sleep at about 4 AM with hard crampy pain across her lower abdomen which was associated with nausea and a cold sweat. She had felt constipated and had taken a do collapse tablet the night before which she does not normally use. She says the pain was followed by a gas and several bowel movements which became loose and then maroonish bloody in appearance. She says she had several episodes of bloody stool yesterday during the day, had no appetite but no vomiting. This morning she was able to eat a little bit and says she feels better because the nausea is gone . She has had 2 further episodes of dark bloody-appearing stool. She says her abdominal pain is not as bad but she is still tender. She had been feeling fine on the few days prior to acute onset, no new meds etc. she is on Toprol.    Review of Systems  Constitutional: Positive for appetite change and fatigue.  HENT: Negative.   Eyes: Negative.   Respiratory: Negative.   Cardiovascular: Negative.   Gastrointestinal: Positive for nausea, abdominal pain, diarrhea and blood in stool.  Endocrine: Negative.   Genitourinary: Negative.   Allergic/Immunologic: Negative.   Neurological: Negative.    Hematological: Negative.   Psychiatric/Behavioral: Negative.    Outpatient Prescriptions Prior to Visit  Medication Sig Dispense Refill  . albuterol (PROVENTIL HFA;VENTOLIN HFA) 108 (90 BASE) MCG/ACT inhaler Inhale 2 puffs into the lungs every 6 (six) hours as needed for wheezing.  1 Inhaler  0  . aspirin 81 MG tablet Take 81 mg by mouth daily.        . Cholecalciferol (VITAMIN D3) 1000 UNITS CAPS Take 1 capsule by mouth.      . cyanocobalamin (,VITAMIN B-12,) 1000 MCG/ML injection Inject 0.01 mLs (10 mcg total) into the muscle as directed.  10 mL  1  . fluocinonide (LIDEX) 0.05 % cream Apply topically 2 (two) times daily. As directed by dermatologist       . fluticasone (FLONASE) 50 MCG/ACT nasal spray Place 1 spray into the nose daily.  16 g  5  . gabapentin (NEURONTIN) 800 MG tablet TAKE 1 TABLET THREE TIMES A DAY AS NEEDED  270 tablet  3  . guaiFENesin (MUCINEX) 600 MG 12 hr tablet Take 1 tablet (600 mg total) by mouth 2 (two) times daily.  60 tablet  2  . LORazepam (ATIVAN) 1 MG tablet 1/2 to 1 tablet daily as needed  60 tablet  2  . metoprolol tartrate (LOPRESSOR) 25 MG tablet Take 25 mg by mouth as directed. 1/2 tablet twice a day      . Multiple Vitamins-Minerals (MULTIVITAL) tablet Take 1 tablet by mouth daily.        Marland Kitchen  ondansetron (ZOFRAN ODT) 8 MG disintegrating tablet Take 1 tablet (8 mg total) by mouth as needed.  10 tablet  1  . Psyllium (METAMUCIL) 30.9 % POWD Take by mouth daily.        Marland Kitchen ZETIA 10 MG tablet TAKE 1 TABLET DAILY  90 tablet  3  . zolpidem (AMBIEN CR) 12.5 MG CR tablet Take 1 tablet (12.5 mg total) by mouth at bedtime.  90 tablet  1   No facility-administered medications prior to visit.      Allergies  Allergen Reactions  . Sulfamethoxazole Swelling    Swelling of the throat, mouth, tongue, nose  . Ceftin     GI problems  . Darvocet (Propoxyphene-Acetaminophen) Itching  . Erythromycin     Upset stomach  . Hydrocod Polst-Cpm Polst Er     nausea  .  Lovastatin   . Paroxetine Hcl     jittery  . Penicillins   . Theophyllines    Patient Active Problem List  Diagnosis  . POLYP, COLON  . HYPERLIPIDEMIA  . ANXIETY  . DEPRESSION  . UNSPECIFIED RETINAL VASCULAR OCCLUSION  . HYPERTENSION  . EXTERNAL HEMORRHOIDS  . COPD  . GASTROESOPHAGEAL REFLUX DISEASE  . GASTRITIS  . DIVERTICULOSIS, COLON  . ABDOMINAL PAIN, GENERALIZED  . Raynaud's disease /phenomenon  . Idiopathic peripheral neuropathy  . MVP (mitral valve prolapse)  . Palpitations  . Osteoporosis  . Hemoptysis  . Abnormal chest xray  . Pulmonary nodule   History  Substance Use Topics  . Smoking status: Former Smoker -- 1.00 packs/day for 25 years    Types: Cigarettes    Quit date: 09/21/1982  . Smokeless tobacco: Never Used  . Alcohol Use: Yes     Comment: rarely  family history includes Breast cancer in her mother; Cirrhosis in her brother; and Liver disease in her brother. Objective:   Physical Exam well-developed thin elderly white female in no acute distress, pleasant ambulates with a cane. Blood pressure 130/90 pulse 68 height 5 foot 3 weight 92. HEENT; nontraumatic normocephalic EOMI PERRLA sclera anicteric,Neck; Supple no JVD, Cardiovascular; regular rate and rhythm with S1-S2 no murmur or gallop, Pulmonary; clear bilaterally, Abdomen; soft flat bowel sounds are active she is tender in the left mid quadrant and left lower quadrant there is no guarding or rebound no palpable mass or hepatosplenomegaly bowel sounds are present, Rectal; exam dark maroonish stool. Extremities ;no clubbing cyanosis or edema skin warm and dry, Psych; mood and affect normal and appropriate        Assessment & Plan:  #70 79 year old white female with acute episode of lower abdominal crampy pain, nausea diaphoresis followed by loose stools and then more frankly bloody stools. Onset of about 36 hours ago and now resolving. Suspect she has had an episode of segmental ischemic colitis,  perhaps precipitated by Dulcolax and bowel spasm. #2 Diverticulosis #3 peripheral neuropathy #4 hypertension  Plan; natural history of segmental ischemic colitis discussed with patient. She is reassured We'll check CBC with differential Home to rest, push oral fluids and start soft bland diet over the next few days until her pain resolves Tylenol as needed for discomfort Phenergan 12.5 mg every 6 hours as needed for nausea We will check on her in 24 hours and her encouraged her to call back in the interim should her bleeding or pain increase. If she's not continuing to improve in the next 24-48 hours- may need CT of the abdomen and pelvis In the future she will  try MiraLax 17 g in 8 ounces of water when necessary constipation.

## 2012-11-30 NOTE — Patient Instructions (Addendum)
Please go to the basement level to have your labs drawn.  Push fluids. We sent a prescription for Phenergan 12.5 mg to [facility].  Take Tylenol as needed for pain. Eat a soft bland diet for the next several days. Call us back if bleeding occurs. Take Miralax as needed for constipation.

## 2012-12-15 ENCOUNTER — Encounter: Payer: Self-pay | Admitting: Emergency Medicine

## 2012-12-15 ENCOUNTER — Ambulatory Visit (INDEPENDENT_AMBULATORY_CARE_PROVIDER_SITE_OTHER): Payer: Medicare Other | Admitting: Emergency Medicine

## 2012-12-15 VITALS — BP 122/80 | HR 61 | Ht 63.0 in | Wt 93.2 lb

## 2012-12-15 DIAGNOSIS — R911 Solitary pulmonary nodule: Secondary | ICD-10-CM

## 2012-12-15 NOTE — Patient Instructions (Addendum)
Your TB blood work is negative We will repeat your CT scan in July 2014.  Follow with Dr Delton Coombes in July after the CT scan to review.

## 2012-12-15 NOTE — Assessment & Plan Note (Signed)
Quant gold - negative Due for repeat Ct scan July '14 for nodular disease.

## 2012-12-15 NOTE — Progress Notes (Signed)
Subjective:    Patient ID: Jenny Torres, female    DOB: 01-27-1928, 79 y.o.   MRN: [id number]  HPI 79 yo woman, former smoker (25 pk-yrs) followed by Dr Patty Sermons for HTN, MV prolapse, hx PPD positive. She underwent a CXR Summer '13, prompted a CT scan to eval in 8/13 and then repeat 1/14. The scan shows some biapical scar and a discrete RML nodule that has slightly decreased in size since 8/13. She doesn't recall ever being treated with INH. She does recall having the positive PPD in her 30's and being admitted for PNA x 1 week, but she never took an abx for 6 months. She had a severe PNA at age 51. She denies fever, chills, sweats, cough. Feels well.   ROV 10/24/12 -- Returns for f/u. Quant Gold test was sent and is still pending. Need the results to decide whether we will rx with INH. Plan for CT scan after 6 months to follow the nodule  ROV 12/15/12 -- f/u for hx PPD positive.  Her quant gold test is negative. She is due for repeat CT scan in July '14. No cough. She is having PND.      Objective:   Physical Exam Filed Vitals:   12/15/12 1156  BP: 122/80  Pulse: 61   Exam not performed today  CT chest 10/11/12 --  Comparison: Chest CT 05/12/2012.  Findings:  Mediastinum: Heart size is normal. There is no significant  pericardial fluid, thickening or pericardial calcification. There  is atherosclerosis of the thoracic aorta, the great vessels of the  mediastinum and the coronary arteries, including calcified  atherosclerotic plaque in the left main, left anterior descending,  circumflex and right coronary arteries. No pathologically enlarged  mediastinal or hilar lymph nodes. Please note that accurate  exclusion of hilar adenopathy is limited on noncontrast CT scans.  Numerous calcified right hilar and mediastinal lymph nodes are  noted, likely related to old granulomatous disease. The esophagus  is unremarkable in appearance.  Lungs/Pleura: The dominant nodular opacity noted on  the prior  examination in the right middle lobe has decreased in size,  measuring approximately 11 x 7 mm on today's study, and appears  significantly less bulging on the prior examination. There  continues to be some patchy areas of mild bronchial wall thickening  with some thickening of the peribronchovascular interstitium, focal  areas of architectural distortion, mild cylindrical bronchiectasis  and peribronchovascular micronodularity, most pronounced in the  inferior segment of the lingula and right middle lobe, with  additional patchy involvement of the lungs bilaterally. The  overall appearance is similar to the prior study, most compatible  with a chronic indolent atypical infectious process such as MAI.  Calcified bilateral pleural plaques are also noted, most pronounced  in the upper thorax bilaterally. No acute consolidative airspace  disease. No pleural effusions.  Upper Abdomen: Multiple calcifications in the liver and spleen  likely represent calcified granulomas.  Musculoskeletal: Multiple old healed bilateral rib fractures are  noted. There are no aggressive appearing lytic or blastic lesions  noted in the visualized portions of the skeleton.  IMPRESSION:  1. No definite suspicious appearing pulmonary nodules or masses  are identified.  2. The appearance of the lungs is most suggestive of a chronic  indolent atypical infectious process such as Mycobacterium avium-  intracellulare (MAI). The dominant nodular opacity in the right  middle lobe noted on the prior examination is smaller on today's  study, most compatible with an infectious  or inflammatory process.  The other smaller nodules in the lungs are most compatible with  areas of chronic mucoid impaction within terminal bronchioles.  3. Calcified bilateral pleural plaques. This may be a sign of  asbestos related pleural disease, or could be related to prior  bilateral pleural infection or bilateral pleural  hemorrhage. At  this time, there are no findings to suggest interstitial lung  disease such as asbestosis in the lungs.  4. Atherosclerosis, including left main and three-vessel coronary  artery disease.  5. Sequelae of old granulomatous disease, as above.      Assessment & Plan:  Pulmonary nodule Quant gold - negative Due for repeat Ct scan July '14 for nodular disease.

## 2012-12-26 ENCOUNTER — Other Ambulatory Visit: Payer: Self-pay | Admitting: *Deleted

## 2012-12-26 ENCOUNTER — Ambulatory Visit (INDEPENDENT_AMBULATORY_CARE_PROVIDER_SITE_OTHER): Payer: Medicare Other

## 2012-12-26 DIAGNOSIS — E7800 Pure hypercholesterolemia, unspecified: Secondary | ICD-10-CM

## 2012-12-26 LAB — HEPATIC FUNCTION PANEL
ALT: 18 U/L (ref 0–35)
Bilirubin, Direct: 0.2 mg/dL (ref 0.0–0.3)
Total Bilirubin: 1 mg/dL (ref 0.3–1.2)
Total Protein: 6.8 g/dL (ref 6.0–8.3)

## 2012-12-26 LAB — LIPID PANEL
HDL: 86.1 mg/dL (ref >39.00)
Triglycerides: 71 mg/dL (ref 0.0–149.0)
VLDL: 14.2 mg/dL (ref 0.0–40.0)

## 2012-12-26 LAB — BASIC METABOLIC PANEL
BUN: 11 mg/dL (ref 6–23)
Chloride: 95 mEq/L — ABNORMAL LOW (ref 96–112)
GFR: 101.51 mL/min (ref >60.00)
Potassium: 3.6 mEq/L (ref 3.5–5.1)
Sodium: 132 mEq/L — ABNORMAL LOW (ref 135–145)

## 2012-12-27 LAB — LDL CHOLESTEROL, DIRECT: Direct LDL: 96.8 mg/dL

## 2012-12-27 NOTE — Progress Notes (Signed)
Quick Note:  Please make copy of labs for patient visit. ______ 

## 2012-12-29 ENCOUNTER — Encounter: Payer: Self-pay | Admitting: Cardiology

## 2012-12-29 ENCOUNTER — Ambulatory Visit (INDEPENDENT_AMBULATORY_CARE_PROVIDER_SITE_OTHER): Payer: Medicare Other | Admitting: Cardiology

## 2012-12-29 VITALS — BP 128/74 | HR 62 | Ht 64.0 in | Wt 92.2 lb

## 2012-12-29 DIAGNOSIS — I7300 Raynaud's syndrome without gangrene: Secondary | ICD-10-CM

## 2012-12-29 DIAGNOSIS — E7800 Pure hypercholesterolemia, unspecified: Secondary | ICD-10-CM

## 2012-12-29 DIAGNOSIS — R002 Palpitations: Secondary | ICD-10-CM

## 2012-12-29 DIAGNOSIS — I341 Nonrheumatic mitral (valve) prolapse: Secondary | ICD-10-CM

## 2012-12-29 DIAGNOSIS — E538 Deficiency of other specified B group vitamins: Secondary | ICD-10-CM

## 2012-12-29 DIAGNOSIS — I059 Rheumatic mitral valve disease, unspecified: Secondary | ICD-10-CM

## 2012-12-29 DIAGNOSIS — R7989 Other specified abnormal findings of blood chemistry: Secondary | ICD-10-CM

## 2012-12-29 MED ORDER — CYANOCOBALAMIN 1000 MCG/ML IJ SOLN: INTRAMUSCULAR | Status: DC

## 2012-12-29 NOTE — Assessment & Plan Note (Signed)
The patient has a history of low vitamin B12 levels.  Recently she has not been taking any vitamin B12 shots.  She does have chronic weakness of her legs with continued loss of muscle mass in her lower extremity.  She walks with a cane.  Her weight is down another 2 pounds since last visit.  We gave her a new prescription for vitamin B12 and she will take 1000 mcg weekly for 4 weeks and then monthly.  Hopefully this may help some of the ataxia and weakness that she exhibits in her lower extremities.  She also remains on gabapentin 800 mg 2 or 3 times a day

## 2012-12-29 NOTE — Patient Instructions (Addendum)
Vitamin B 12 injections have been sent to [facility]  Your physician wants you to follow-up in: 4 months with fasting labs (lp/bmet/hfp)  You will receive a reminder letter in the mail two months in advance. If you don't receive a letter, please call our office to schedule the follow-up appointment.

## 2012-12-29 NOTE — Assessment & Plan Note (Signed)
Patient has not been experiencing any recent palpitations or chest pains.

## 2012-12-29 NOTE — Progress Notes (Signed)
Jenny Torres Date of Birth:  1928/01/10 [facility] [address] [place], [place]  [address] [phone]         Fax   [phone]  History of Present Illness: This pleasant 79 year old woman is seen for a scheduled four-month followup office visit. He has a past history of palpitations and mitral valve prolapse. Also has a history of essential hypertension and hypercholesterolemia. She has a history of Raynaud's disease. She has had situational depression related to ongoing stress regarding her husband's dementia. She has a history of allergies and past history of asthma.  She has a past history of low vitamin B12 levels first noted by her neurologist at [facility]. In August 2013 the patient had an episode of hemoptysis. She had a previous episode 50 years earlier and at that time had been told that she had a scar on her lung. In August 2013 she had a chest x-ray and then a CT scan of the chest which showed no definite cause for the hemoptysis. There was some peribronchial nodularity and a repeat CT was recommended for 3-4 months subsequently to assure stability.  She is being followed by Dr. Delton Coombes for pulmonary.   Current Outpatient Prescriptions  Medication Sig Dispense Refill  . aspirin 81 MG tablet Take 81 mg by mouth daily.        . Cholecalciferol (VITAMIN D3) 1000 UNITS CAPS Take 1 capsule by mouth.      . fluocinonide (LIDEX) 0.05 % cream Apply topically 2 (two) times daily. As directed by dermatologist       . fluticasone (FLONASE) 50 MCG/ACT nasal spray Place 1 spray into the nose daily as needed.      . gabapentin (NEURONTIN) 800 MG tablet       . LORazepam (ATIVAN) 1 MG tablet 1/2 to 1 tablet daily as needed  60 tablet  2  . metoprolol tartrate (LOPRESSOR) 25 MG tablet Take 25 mg by mouth as directed. 1/2 tablet twice a day      . Multiple Vitamins-Minerals (MULTIVITAL) tablet Take 1 tablet by mouth daily.        . ondansetron (ZOFRAN ODT) 8 MG  disintegrating tablet Take 1 tablet (8 mg total) by mouth as needed.  10 tablet  1  . promethazine (PHENERGAN) 12.5 MG tablet Take 1 tablet (12.5 mg total) by mouth every 8 (eight) hours as needed for nausea.  20 tablet  0  . Psyllium (METAMUCIL) 30.9 % POWD Take by mouth daily.        Marland Kitchen ZETIA 10 MG tablet TAKE 1 TABLET DAILY  90 tablet  3  . zolpidem (AMBIEN CR) 12.5 MG CR tablet Take 1 tablet (12.5 mg total) by mouth at bedtime.  90 tablet  1  . albuterol (PROVENTIL HFA;VENTOLIN HFA) 108 (90 BASE) MCG/ACT inhaler Inhale 2 puffs into the lungs every 6 (six) hours as needed for wheezing.  1 Inhaler  0  . cyanocobalamin (,VITAMIN B-12,) 1000 MCG/ML injection 1 cc weekly x 4 weeks and then monthly  30 mL  1   No current facility-administered medications for this visit.    Allergies  Allergen Reactions  . Sulfamethoxazole Swelling    Swelling of the throat, mouth, tongue, nose  . Ceftin     GI problems  . Darvocet (Propoxyphene-Acetaminophen) Itching  . Erythromycin     Upset stomach  . Hydrocod Polst-Cpm Polst Er     nausea  . Lovastatin   . Paroxetine Hcl  jittery  . Penicillins   . Theophyllines     Patient Active Problem List  Diagnosis  . POLYP, COLON  . HYPERLIPIDEMIA  . ANXIETY  . DEPRESSION  . UNSPECIFIED RETINAL VASCULAR OCCLUSION  . HYPERTENSION  . EXTERNAL HEMORRHOIDS  . COPD  . GASTROESOPHAGEAL REFLUX DISEASE  . GASTRITIS  . DIVERTICULOSIS, COLON  . ABDOMINAL PAIN, GENERALIZED  . Raynaud's disease /phenomenon  . Idiopathic peripheral neuropathy  . MVP (mitral valve prolapse)  . Palpitations  . Osteoporosis  . Hemoptysis  . Abnormal chest xray  . Pulmonary nodule  . Low vitamin B12 level    History  Smoking status  . Former Smoker -- 1.00 packs/day for 25 years  . Types: Cigarettes  . Quit date: 09/21/1982  Smokeless tobacco  . Never Used    History  Alcohol Use  . Yes    Comment: rarely    Family History  Problem Relation Age of Onset    . Breast cancer Mother   . Liver disease Brother   . Cirrhosis Brother     Review of Systems: Constitutional: no fever chills diaphoresis or fatigue or change in weight.  Head and neck: no hearing loss, no epistaxis, no photophobia or visual disturbance. Respiratory: No cough, shortness of breath or wheezing. Cardiovascular: No chest pain peripheral edema, palpitations. Gastrointestinal: No abdominal distention, no abdominal pain, no change in bowel habits hematochezia or melena. Genitourinary: No dysuria, no frequency, no urgency, no nocturia. Musculoskeletal:No arthralgias, no back pain, no gait disturbance or myalgias. Neurological: No dizziness, no headaches, no numbness, no seizures, no syncope, no weakness, no tremors. Hematologic: No lymphadenopathy, no easy bruising. Psychiatric: No confusion, no hallucinations, no sleep disturbance.    Physical Exam: Filed Vitals:   12/29/12 1545  BP: 128/74  Pulse: 62   the general appearance reveals a very thin woman who has lost 2 more pounds.The head and neck exam reveals pupils equal and reactive.  Extraocular movements are full.  There is no scleral icterus.  The mouth and pharynx are normal.  The neck is supple.  The carotids reveal no bruits.  The jugular venous pressure is normal.  The  thyroid is not enlarged.  There is no lymphadenopathy.  The chest is clear to percussion and auscultation.  There are no rales or rhonchi.  Expansion of the chest is symmetrical.  The precordium is quiet.  The first heart sound is normal.  The second heart sound is physiologically split.  There is no murmur gallop rub and there is a faint apical click.  There is no abnormal lift or heave.  The abdomen is soft and nontender.  The bowel sounds are normal.  The liver and spleen are not enlarged.  There are no abdominal masses.  There are no abdominal bruits.  Extremities reveal good pedal pulses.  There is no phlebitis or edema.  There is no cyanosis or  clubbing.  Strength diminished in the lower extremities and she has an ataxic gait.     The skin is warm and dry.  There is no rash.     Assessment / Plan: Continue same medication and resume vitamin B12 shots for low serum vitamin B12 level diagnosis.  Hopefully this may help to stabilize her lower extremity problems. Recheck in 4 months for followup office visit lipid panel hepatic function panel and basal metabolic panel.

## 2012-12-29 NOTE — Assessment & Plan Note (Signed)
The patient has a history of mitral valve prolapse.  She has not been experiencing any recent chest pain or angina.

## 2013-01-02 ENCOUNTER — Other Ambulatory Visit: Payer: Self-pay | Admitting: *Deleted

## 2013-01-02 MED ORDER — EZETIMIBE 10 MG PO TABS: 10.0000 mg | ORAL_TABLET | Freq: Every day | ORAL | Status: DC

## 2013-01-18 ENCOUNTER — Other Ambulatory Visit: Payer: Self-pay | Admitting: Cardiology

## 2013-01-18 DIAGNOSIS — R05 Cough: Secondary | ICD-10-CM

## 2013-01-18 MED ORDER — HYDROCOD POLST-CHLORPHEN POLST 10-8 MG/5ML PO LQCR: 5.0000 mL | Freq: Two times a day (BID) | ORAL | Status: DC | PRN

## 2013-01-18 NOTE — Telephone Encounter (Signed)
Called to pharmacy and advised patient

## 2013-01-18 NOTE — Telephone Encounter (Signed)
Has been having a lot of problems of allergies, chest congestion, and coughing. Last night unable to sleep secondary to cough. Gets this every year and took her last dose of Tussionex last night. Last refilled last March. Would like a refill on her Tussionex to [facility]. Will forward to  Dr. Patty Sermons for review

## 2013-01-18 NOTE — Telephone Encounter (Signed)
New problem   Pt is having problem with allergies and stated Dr Patty Sermons had giving her a prescription for some cough syrup she needs refilled. Pt would like to speak to you concerning this matter.

## 2013-01-18 NOTE — Telephone Encounter (Signed)
Okay to refill tussionex. 

## 2013-01-21 ENCOUNTER — Ambulatory Visit (INDEPENDENT_AMBULATORY_CARE_PROVIDER_SITE_OTHER): Payer: Medicare Other | Admitting: Family Medicine

## 2013-01-21 VITALS — BP 193/96 | HR 78 | Temp 97.2°F | Resp 17 | Ht 64.0 in | Wt 82.0 lb

## 2013-01-21 DIAGNOSIS — R0602 Shortness of breath: Secondary | ICD-10-CM

## 2013-01-21 DIAGNOSIS — R059 Cough, unspecified: Secondary | ICD-10-CM

## 2013-01-21 DIAGNOSIS — H6123 Impacted cerumen, bilateral: Secondary | ICD-10-CM

## 2013-01-21 DIAGNOSIS — H6120 Impacted cerumen, unspecified ear: Secondary | ICD-10-CM

## 2013-01-21 DIAGNOSIS — J40 Bronchitis, not specified as acute or chronic: Secondary | ICD-10-CM

## 2013-01-21 DIAGNOSIS — R05 Cough: Secondary | ICD-10-CM

## 2013-01-21 DIAGNOSIS — J329 Chronic sinusitis, unspecified: Secondary | ICD-10-CM

## 2013-01-21 LAB — POCT CBC
Granulocyte percent: 79.7 %G (ref 37–80)
HCT, POC: 42.1 % (ref 37.7–47.9)
Hemoglobin: 13.3 g/dL (ref 12.2–16.2)
Lymph, poc: 1.3 (ref 0.6–3.4)
MCH, POC: 29.2 pg (ref 27–31.2)
MCHC: 31.6 g/dL — AB (ref 31.8–35.4)
MCV: 92.3 fL (ref 80–97)
MID (cbc): 0.6 (ref 0–0.9)
MPV: 7 fL (ref 0–99.8)
POC Granulocyte: 7.4 — AB (ref 2–6.9)
POC LYMPH PERCENT: 14.1 %L (ref 10–50)
POC MID %: 6.2 %M (ref 0–12)
Platelet Count, POC: 286 10*3/uL (ref 142–424)
RBC: 4.56 M/uL (ref 4.04–5.48)
RDW, POC: 13.9 %
WBC: 9.3 10*3/uL (ref 4.6–10.2)

## 2013-01-21 MED ORDER — LEVOFLOXACIN 500 MG PO TABS: 500.0000 mg | ORAL_TABLET | Freq: Every day | ORAL | Status: AC

## 2013-01-21 MED ORDER — ALBUTEROL SULFATE HFA 108 (90 BASE) MCG/ACT IN AERS: 2.0000 | INHALATION_SPRAY | Freq: Four times a day (QID) | RESPIRATORY_TRACT | Status: DC | PRN

## 2013-01-21 NOTE — Progress Notes (Signed)
79 yo woman whose husband has alzheimers and is in the hospital comes in with cough, headache, shortness of breath x several days.  The phlegm in am is not clear.  No fever.  She did get cold and clammy last night with sweats.  No chest pains.    Objective:  NAD  140/64 recheck left arm TM's bilateral cerumen impactions Oroph:  Clear Neck:  No adenopathy Heart:  Regular, no murmur Chest: few rales left side Skin:  Small abrasions on right cheek Ext: no edema Results for orders placed in visit on 01/21/13  POCT CBC      Result Value Range   WBC 9.3  4.6 - 10.2 K/uL   Lymph, poc 1.3  0.6 - 3.4   POC LYMPH PERCENT 14.1  10 - 50 %L   MID (cbc) 0.6  0 - 0.9   POC MID % 6.2  0 - 12 %M   POC Granulocyte 7.4 (*) 2 - 6.9   Granulocyte percent 79.7  37 - 80 %G   RBC 4.56  4.04 - 5.48 M/uL   Hemoglobin 13.3  12.2 - 16.2 g/dL   HCT, POC 16.1  09.6 - 47.9 %   MCV 92.3  80 - 97 fL   MCH, POC 29.2  27 - 31.2 pg   MCHC 31.6 (*) 31.8 - 35.4 g/dL   RDW, POC 04.5     Platelet Count, POC 286  142 - 424 K/uL   MPV 7.0  0 - 99.8 fL    Assessment:  Acute bronchitis Cerumen impaction:  Resolved with lavage  Plan: Cough - Plan: POCT CBC, levofloxacin (LEVAQUIN) 500 MG tablet, albuterol (PROVENTIL HFA;VENTOLIN HFA) 108 (90 BASE) MCG/ACT inhaler  Cerumen impaction, bilateral  SOB (shortness of breath) - Plan: levofloxacin (LEVAQUIN) 500 MG tablet  Bronchitis - Plan: levofloxacin (LEVAQUIN) 500 MG tablet  Sinusitis - Plan: levofloxacin (LEVAQUIN) 500 MG tablet

## 2013-01-21 NOTE — Patient Instructions (Addendum)

## 2013-02-08 ENCOUNTER — Other Ambulatory Visit: Payer: Self-pay | Admitting: Cardiology

## 2013-02-08 ENCOUNTER — Telehealth: Payer: Self-pay | Admitting: *Deleted

## 2013-02-08 DIAGNOSIS — G4700 Insomnia, unspecified: Secondary | ICD-10-CM

## 2013-02-08 NOTE — Telephone Encounter (Signed)
New problem    Returning call back to nurse.   

## 2013-02-08 NOTE — Telephone Encounter (Signed)
Left message to call back  

## 2013-02-08 NOTE — Telephone Encounter (Signed)
Patient left message on refill line to have Zolpidem refilled from Dr Patty Sermons. She would also like a call back from nurse. I will forward this message to [place].   Micki Riley, CMA

## 2013-02-09 NOTE — Telephone Encounter (Signed)
Discuss Ambien dose with  Dr. Patty Sermons next week

## 2013-02-12 MED ORDER — ZOLPIDEM TARTRATE ER 12.5 MG PO TBCR: 12.5000 mg | EXTENDED_RELEASE_TABLET | Freq: Every day | ORAL | Status: DC

## 2013-02-16 NOTE — Telephone Encounter (Signed)
Discussed with  Dr. Patty Sermons and will continue current Ambien dose

## 2013-03-28 ENCOUNTER — Other Ambulatory Visit: Payer: Self-pay | Admitting: Cardiology

## 2013-03-28 DIAGNOSIS — G629 Polyneuropathy, unspecified: Secondary | ICD-10-CM

## 2013-04-28 ENCOUNTER — Encounter: Payer: Self-pay | Admitting: Cardiology

## 2013-04-28 ENCOUNTER — Ambulatory Visit (INDEPENDENT_AMBULATORY_CARE_PROVIDER_SITE_OTHER): Payer: Medicare Other | Admitting: Cardiology

## 2013-04-28 ENCOUNTER — Other Ambulatory Visit: Payer: Medicare Other

## 2013-04-28 VITALS — BP 134/80 | HR 60 | Ht 63.0 in | Wt 89.4 lb

## 2013-04-28 DIAGNOSIS — G609 Hereditary and idiopathic neuropathy, unspecified: Secondary | ICD-10-CM

## 2013-04-28 DIAGNOSIS — E7800 Pure hypercholesterolemia, unspecified: Secondary | ICD-10-CM

## 2013-04-28 DIAGNOSIS — I059 Rheumatic mitral valve disease, unspecified: Secondary | ICD-10-CM

## 2013-04-28 DIAGNOSIS — E785 Hyperlipidemia, unspecified: Secondary | ICD-10-CM

## 2013-04-28 DIAGNOSIS — I10 Essential (primary) hypertension: Secondary | ICD-10-CM

## 2013-04-28 DIAGNOSIS — I341 Nonrheumatic mitral (valve) prolapse: Secondary | ICD-10-CM

## 2013-04-28 LAB — BASIC METABOLIC PANEL
CO2: 29 mEq/L (ref 19–32)
Calcium: 9.4 mg/dL (ref 8.4–10.5)
Chloride: 91 mEq/L — ABNORMAL LOW (ref 96–112)
Creatinine, Ser: 0.5 mg/dL (ref 0.4–1.2)
Glucose, Bld: 79 mg/dL (ref 70–99)

## 2013-04-28 LAB — LIPID PANEL
LDL Cholesterol: 91 mg/dL (ref 0–99)
Total CHOL/HDL Ratio: 2
Triglycerides: 48 mg/dL (ref 0.0–149.0)

## 2013-04-28 LAB — HEPATIC FUNCTION PANEL
AST: 29 U/L (ref 0–37)
Albumin: 4.2 g/dL (ref 3.5–5.2)
Alkaline Phosphatase: 82 U/L (ref 39–117)
Bilirubin, Direct: 0.1 mg/dL (ref 0.0–0.3)
Total Bilirubin: 1 mg/dL (ref 0.3–1.2)

## 2013-04-28 NOTE — Assessment & Plan Note (Signed)
The patient is having no side effects from the ezetimibe.  She has lost 3 more pounds since last visit on the intentionally.  At this point because of ongoing weight loss we are not being strict about her dietary intake.  We want her to gain weight.  She has been drinking milk shakes and also suggested that she try some Ensure or boost.

## 2013-04-28 NOTE — Assessment & Plan Note (Signed)
The patient remains on gabapentin for her neuropathy.

## 2013-04-28 NOTE — Assessment & Plan Note (Signed)
Blood pressure was remaining stable on current therapy.  No dizziness or syncope.  She has poor coordination of walking because of her peripheral neuropathy

## 2013-04-28 NOTE — Patient Instructions (Addendum)
Your physician recommends that you return for lab work in: today and in 4 months   Your physician recommends that you continue on your current medications as directed. Please refer to the Current Medication list given to you today.

## 2013-04-28 NOTE — Progress Notes (Signed)
Jenny Torres Date of Birth:  1928/01/22 [facility] [address] [place], [place]  [address] [phone]         Fax   [phone]  History of Present Illness: This pleasant 79 year old woman is seen for a scheduled four-month followup office visit. He has a past history of palpitations and mitral valve prolapse. Also has a history of essential hypertension and hypercholesterolemia. She has a history of Raynaud's disease. She has had situational depression related to ongoing stress regarding her husband's dementia. She has a history of allergies and past history of asthma. She has a past history of low vitamin B12 levels first noted by her neurologist at [facility].  She has had difficulty in gaining weight.    Current Outpatient Prescriptions  Medication Sig Dispense Refill  . albuterol (PROVENTIL HFA;VENTOLIN HFA) 108 (90 BASE) MCG/ACT inhaler Inhale 2 puffs into the lungs every 6 (six) hours as needed for wheezing.  1 Inhaler  0  . aspirin 81 MG tablet Take 81 mg by mouth daily.        . Cholecalciferol (VITAMIN D3) 1000 UNITS CAPS Take 1 capsule by mouth.      . cyanocobalamin (,VITAMIN B-12,) 1000 MCG/ML injection 1 cc weekly x 4 weeks and then monthly  30 mL  1  . ezetimibe (ZETIA) 10 MG tablet Take 1 tablet (10 mg total) by mouth daily.  90 tablet  3  . fluocinonide (LIDEX) 0.05 % cream Apply topically 2 (two) times daily. As directed by dermatologist       . fluticasone (FLONASE) 50 MCG/ACT nasal spray Place 1 spray into the nose daily as needed.      . gabapentin (NEURONTIN) 800 MG tablet TAKE 1 TABLET THREE TIMES A DAY AS NEEDED  270 tablet  3  . LORazepam (ATIVAN) 1 MG tablet 1/2 to 1 tablet daily as needed  60 tablet  2  . metoprolol tartrate (LOPRESSOR) 25 MG tablet Take 25 mg by mouth as directed. 1/2 tablet twice a day      . Multiple Vitamins-Minerals (MULTIVITAL) tablet Take 1 tablet by mouth daily.        . ondansetron (ZOFRAN ODT) 8 MG disintegrating  tablet Take 1 tablet (8 mg total) by mouth as needed.  10 tablet  1  . Psyllium (METAMUCIL) 30.9 % POWD Take by mouth daily.        Marland Kitchen zolpidem (AMBIEN CR) 12.5 MG CR tablet Take 1 tablet (12.5 mg total) by mouth at bedtime.  30 tablet  1   No current facility-administered medications for this visit.    Allergies  Allergen Reactions  . Sulfamethoxazole Swelling    Swelling of the throat, mouth, tongue, nose  . Ceftin     GI problems  . Darvocet (Propoxyphene-Acetaminophen) Itching  . Erythromycin     Upset stomach  . Hydrocod Polst-Cpm Polst Er     nausea  . Lovastatin   . Paroxetine Hcl     jittery  . Penicillins   . Theophyllines     Patient Active Problem List   Diagnosis Date Noted  . Low vitamin B12 level 12/29/2012  . Pulmonary nodule 10/21/2012  . Idiopathic peripheral neuropathy   . MVP (mitral valve prolapse)   . Osteoporosis   . Raynaud's disease /phenomenon 04/24/2011  . UNSPECIFIED RETINAL VASCULAR OCCLUSION 05/22/2010  . HYPERLIPIDEMIA 11/10/2007  . ANXIETY 11/10/2007  . DEPRESSION 11/10/2007  . HYPERTENSION 11/10/2007  . COPD 11/10/2007  . GASTROESOPHAGEAL REFLUX  DISEASE 11/10/2007  . EXTERNAL HEMORRHOIDS 04/08/2004  . DIVERTICULOSIS, COLON 04/08/2004  . POLYP, COLON 04/16/1998    History  Smoking status  . Former Smoker -- 1.00 packs/day for 25 years  . Types: Cigarettes  . Quit date: 09/21/1982  Smokeless tobacco  . Never Used    History  Alcohol Use  . Yes    Comment: rarely    Family History  Problem Relation Age of Onset  . Breast cancer Mother   . Liver disease Brother   . Cirrhosis Brother     Review of Systems: Constitutional: no fever chills diaphoresis or fatigue or change in weight.  Head and neck: no hearing loss, no epistaxis, no photophobia or visual disturbance. Respiratory: No cough, shortness of breath or wheezing. Cardiovascular: No chest pain peripheral edema, palpitations. Gastrointestinal: No abdominal  distention, no abdominal pain, no change in bowel habits hematochezia or melena. Genitourinary: No dysuria, no frequency, no urgency, no nocturia. Musculoskeletal:No arthralgias, no back pain, no gait disturbance or myalgias. Neurological: No dizziness, no headaches, no numbness, no seizures, no syncope, no weakness, no tremors. Hematologic: No lymphadenopathy, no easy bruising. Psychiatric: No confusion, no hallucinations, no sleep disturbance.    Physical Exam: Filed Vitals:   04/28/13 1141  BP: 134/80  Pulse: 60   the general appearance reveals a well-developed well-nourished thin woman in no distress.The head and neck exam reveals pupils equal and reactive.  Extraocular movements are full.  There is no scleral icterus.  The mouth and pharynx are normal.  The neck is supple.  The carotids reveal no bruits.  The jugular venous pressure is normal.  The  thyroid is not enlarged.  There is no lymphadenopathy.  The chest is clear to percussion and auscultation.  There are no rales or rhonchi.  Expansion of the chest is symmetrical.  The precordium is quiet.  The first heart sound is normal.  The second heart sound is physiologically split.  There is no murmur gallop rub or click.  There is no abnormal lift or heave.  The abdomen is soft and nontender.  The bowel sounds are normal.  The liver and spleen are not enlarged.  There are no abdominal masses.  There are no abdominal bruits.  Extremities reveal good pedal pulses.  There is no phlebitis or edema.  There is no cyanosis or clubbing.  Strength is normal and symmetrical in all extremities.  There is no lateralizing weakness.  There are no sensory deficits.  The skin is warm and dry.  There is no rash.     Assessment / Plan: We are checking blood work today.  Continue same medication.  Work harder at [employer].  Recheck in 4 months for office visit lipid panel hepatic function panel and basal metabolic panel

## 2013-04-28 NOTE — Assessment & Plan Note (Signed)
Patient has past history of mitral prolapse.  She's not had any chest pain or increased palpitations

## 2013-04-29 ENCOUNTER — Other Ambulatory Visit (HOSPITAL_COMMUNITY): Payer: Self-pay | Admitting: Cardiology

## 2013-04-29 DIAGNOSIS — J309 Allergic rhinitis, unspecified: Secondary | ICD-10-CM

## 2013-04-30 NOTE — Progress Notes (Signed)
Quick Note:  Please report to patient. The recent labs are stable. Continue same medication and careful diet. ______ 

## 2013-05-02 NOTE — Telephone Encounter (Signed)
Okay to refill flonase.  If unavailable, okay to switch to Nasonex.

## 2013-05-03 ENCOUNTER — Telehealth: Payer: Self-pay | Admitting: *Deleted

## 2013-05-03 NOTE — Telephone Encounter (Signed)
Message copied by Burnell Blanks on Wed May 03, 2013  1:41 PM ------      Message from: Cassell Clement      Created: Sun Apr 30, 2013  4:05 PM       Please report to patient.  The recent labs are stable. Continue same medication and careful diet. ------

## 2013-05-03 NOTE — Telephone Encounter (Signed)
Advised patient of lab results  

## 2013-06-28 ENCOUNTER — Telehealth: Payer: Self-pay | Admitting: Cardiology

## 2013-06-28 ENCOUNTER — Other Ambulatory Visit: Payer: Self-pay | Admitting: Cardiology

## 2013-06-28 DIAGNOSIS — F329 Major depressive disorder, single episode, unspecified: Secondary | ICD-10-CM

## 2013-06-28 MED ORDER — SERTRALINE HCL 25 MG PO TABS: 25.0000 mg | ORAL_TABLET | Freq: Every day | ORAL | Status: DC

## 2013-06-28 NOTE — Telephone Encounter (Signed)
Okay to switch her to generic Zoloft 25 mg one nightly.  Okay to refill Zofran

## 2013-06-28 NOTE — Telephone Encounter (Signed)
Advised patient

## 2013-06-28 NOTE — Telephone Encounter (Signed)
Patient thinks she needs some antidepressants, Lexapro not helping   Also wants refill on Zofran   Both to [facility]  Will forward to  Dr. Patty Sermons for review

## 2013-06-28 NOTE — Telephone Encounter (Signed)
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

## 2013-06-30 ENCOUNTER — Other Ambulatory Visit: Payer: Self-pay | Admitting: Cardiology

## 2013-06-30 DIAGNOSIS — F419 Anxiety disorder, unspecified: Secondary | ICD-10-CM

## 2013-08-07 ENCOUNTER — Ambulatory Visit (INDEPENDENT_AMBULATORY_CARE_PROVIDER_SITE_OTHER): Payer: Medicare Other | Admitting: Podiatry

## 2013-08-07 ENCOUNTER — Encounter: Payer: Self-pay | Admitting: Podiatry

## 2013-08-07 VITALS — BP 146/96 | HR 66 | Resp 22 | Ht 63.0 in | Wt 90.0 lb

## 2013-08-07 DIAGNOSIS — M79609 Pain in unspecified limb: Secondary | ICD-10-CM

## 2013-08-07 DIAGNOSIS — B351 Tinea unguium: Secondary | ICD-10-CM

## 2013-08-08 NOTE — Progress Notes (Signed)
Subjective:     Patient ID: Jenny Torres, female   DOB: [birth year], 79 y.o.   MRN: [id number]  HPI patient's states my toenails are painful any need to cut them form   Review of Systems     Objective:   Physical Exam Neurovascular status intact with nail disease 1-5 both feet and pain    Assessment:    mycotic nail infection with pain 1-5 both feet    Plan:     Debridement painful nailbeds 1-5 both feet with no iatrogenic bleeding noted

## 2013-08-28 ENCOUNTER — Ambulatory Visit: Payer: Medicare Other | Admitting: Cardiology

## 2013-08-28 ENCOUNTER — Other Ambulatory Visit: Payer: Medicare Other

## 2013-08-28 ENCOUNTER — Encounter: Payer: Self-pay | Admitting: Cardiology

## 2013-08-28 ENCOUNTER — Ambulatory Visit (INDEPENDENT_AMBULATORY_CARE_PROVIDER_SITE_OTHER): Payer: Medicare Other | Admitting: Cardiology

## 2013-08-28 VITALS — BP 130/80 | HR 66 | Ht 62.0 in | Wt 87.0 lb

## 2013-08-28 DIAGNOSIS — G4700 Insomnia, unspecified: Secondary | ICD-10-CM

## 2013-08-28 DIAGNOSIS — G609 Hereditary and idiopathic neuropathy, unspecified: Secondary | ICD-10-CM

## 2013-08-28 DIAGNOSIS — I341 Nonrheumatic mitral (valve) prolapse: Secondary | ICD-10-CM

## 2013-08-28 DIAGNOSIS — I10 Essential (primary) hypertension: Secondary | ICD-10-CM

## 2013-08-28 DIAGNOSIS — E7800 Pure hypercholesterolemia, unspecified: Secondary | ICD-10-CM

## 2013-08-28 DIAGNOSIS — I059 Rheumatic mitral valve disease, unspecified: Secondary | ICD-10-CM

## 2013-08-28 LAB — LIPID PANEL
Cholesterol: 206 mg/dL — ABNORMAL HIGH (ref 0–200)
Total CHOL/HDL Ratio: 3
Triglycerides: 57 mg/dL (ref 0.0–149.0)

## 2013-08-28 LAB — BASIC METABOLIC PANEL
CO2: 28 mEq/L (ref 19–32)
Calcium: 9 mg/dL (ref 8.4–10.5)
Chloride: 90 mEq/L — ABNORMAL LOW (ref 96–112)
GFR: 137.72 mL/min (ref >60.00)
Glucose, Bld: 72 mg/dL (ref 70–99)
Sodium: 125 mEq/L — ABNORMAL LOW (ref 135–145)

## 2013-08-28 LAB — HEPATIC FUNCTION PANEL
AST: 32 U/L (ref 0–37)
Albumin: 4 g/dL (ref 3.5–5.2)
Bilirubin, Direct: 0.2 mg/dL (ref 0.0–0.3)
Total Bilirubin: 1 mg/dL (ref 0.3–1.2)

## 2013-08-28 MED ORDER — ZOLPIDEM TARTRATE ER 12.5 MG PO TBCR: 12.5000 mg | EXTENDED_RELEASE_TABLET | Freq: Every day | ORAL | Status: DC

## 2013-08-28 NOTE — Assessment & Plan Note (Signed)
The patient is not having any headaches or dizziness.  No syncope.  She is somewhat unsteady when she walks because of her peripheral neuropathy.

## 2013-08-28 NOTE — Assessment & Plan Note (Signed)
She has severe idiopathic peripheral neuropathy.  He may have been caused in part by previous statin use.  She walks with a cane.  She has an ataxic gait

## 2013-08-28 NOTE — Assessment & Plan Note (Signed)
Patient has a history of mitral valve prolapse.  She has not been having any recent palpitations or tachycardia.  No chest pains

## 2013-08-28 NOTE — Patient Instructions (Signed)

## 2013-08-28 NOTE — Progress Notes (Signed)
Jenny Torres Date of Birth:  [birth year] [address] [place], [place]  [address] [phone]         Fax   [phone]  History of Present Illness: This pleasant 79 year old woman is seen for a scheduled four-month followup office visit. He has a past history of palpitations and mitral valve prolapse. Also has a history of essential hypertension and hypercholesterolemia. She has a history of Raynaud's disease. She has had situational depression related to ongoing stress regarding her husband's dementia. She has a history of allergies and past history of asthma. She has a past history of low vitamin B12 levels first noted by her neurologist at [facility].  She has vitamin B12 injections on hand at home but has not taken any for the past 3 months or more.  She has had difficulty in gaining weight.  Her weight is further today to 87 pounds.    Current Outpatient Prescriptions  Medication Sig Dispense Refill  . albuterol (PROVENTIL HFA;VENTOLIN HFA) 108 (90 BASE) MCG/ACT inhaler Inhale 2 puffs into the lungs every 6 (six) hours as needed for wheezing.  1 Inhaler  0  . aspirin 81 MG tablet Take 81 mg by mouth daily.        . Cholecalciferol (VITAMIN D3) 1000 UNITS CAPS Take 1 capsule by mouth.      . cyanocobalamin (,VITAMIN B-12,) 1000 MCG/ML injection 1 cc weekly x 4 weeks and then monthly  30 mL  1  . ezetimibe (ZETIA) 10 MG tablet Take 1 tablet (10 mg total) by mouth daily.  90 tablet  3  . fluocinonide (LIDEX) 0.05 % cream Apply topically 2 (two) times daily. As directed by dermatologist       . fluticasone (FLONASE) 50 MCG/ACT nasal spray Place 1 spray into the nose daily as needed.      . fluticasone (FLONASE) 50 MCG/ACT nasal spray ONE SPRAY ONCE A DAY INTO NOSE  16 g  prn  . gabapentin (NEURONTIN) 800 MG tablet TAKE 1 TABLET THREE TIMES A DAY AS NEEDED  270 tablet  3  . LORazepam (ATIVAN) 1 MG tablet TAKE 1/2 TO 1 TABLET DAILY AS NEEDED  60 tablet  3  . metoprolol  tartrate (LOPRESSOR) 25 MG tablet Take 0.5 tablets (12.5 mg total) by mouth 2 (two) times daily.  90 tablet  1  . Multiple Vitamins-Minerals (MULTIVITAL) tablet Take 1 tablet by mouth daily.        . ondansetron (ZOFRAN-ODT) 8 MG disintegrating tablet TAKE ONE TABLET AS NEEDED  10 tablet  1  . Psyllium (METAMUCIL) 30.9 % POWD Take by mouth daily.        . sertraline (ZOLOFT) 25 MG tablet Take 1 tablet (25 mg total) by mouth daily.  30 tablet  5  . zolpidem (AMBIEN CR) 12.5 MG CR tablet Take 1 tablet (12.5 mg total) by mouth at bedtime.  90 tablet  1   No current facility-administered medications for this visit.    Allergies  Allergen Reactions  . Sulfamethoxazole Swelling    Swelling of the throat, mouth, tongue, nose  . Ceftin     GI problems  . Darvocet [Propoxyphene-Acetaminophen] Itching  . Erythromycin     Upset stomach  . Hydrocod Polst-Cpm Polst Er     nausea  . Lovastatin   . Paroxetine Hcl     jittery  . Penicillins   . Theophyllines   . Zovirax [Acyclovir]     Per patient lots  of side effects    Patient Active Problem List   Diagnosis Date Noted  . Low vitamin B12 level 12/29/2012  . Pulmonary nodule 10/21/2012  . Idiopathic peripheral neuropathy   . MVP (mitral valve prolapse)   . Osteoporosis   . Raynaud's disease /phenomenon 04/24/2011  . UNSPECIFIED RETINAL VASCULAR OCCLUSION 05/22/2010  . HYPERLIPIDEMIA 11/10/2007  . ANXIETY 11/10/2007  . DEPRESSION 11/10/2007  . HYPERTENSION 11/10/2007  . COPD 11/10/2007  . GASTROESOPHAGEAL REFLUX DISEASE 11/10/2007  . EXTERNAL HEMORRHOIDS 04/08/2004  . DIVERTICULOSIS, COLON 04/08/2004  . POLYP, COLON 04/16/1998    History  Smoking status  . Former Smoker -- 1.00 packs/day for 25 years  . Types: Cigarettes  . Quit date: 09/21/1982  Smokeless tobacco  . Never Used    History  Alcohol Use  . Yes    Comment: rarely    Family History  Problem Relation Age of Onset  . Breast cancer Mother   . Liver  disease Brother   . Cirrhosis Brother     Review of Systems: Constitutional: no fever chills diaphoresis or fatigue or change in weight.  Head and neck: no hearing loss, no epistaxis, no photophobia or visual disturbance. Respiratory: No cough, shortness of breath or wheezing. Cardiovascular: No chest pain peripheral edema, palpitations. Gastrointestinal: No abdominal distention, no abdominal pain, no change in bowel habits hematochezia or melena. Genitourinary: No dysuria, no frequency, no urgency, no nocturia. Musculoskeletal:No arthralgias, no back pain, no gait disturbance or myalgias. Neurological: No dizziness, no headaches, no numbness, no seizures, no syncope, no weakness, no tremors. Hematologic: No lymphadenopathy, no easy bruising. Psychiatric: No confusion, no hallucinations, no sleep disturbance.    Physical Exam: Filed Vitals:   08/28/13 1157  BP: 130/80  Pulse: 66   the general appearance reveals a well-developed well-nourished thin woman in no distress.The head and neck exam reveals pupils equal and reactive.  Extraocular movements are full.  There is no scleral icterus.  The mouth and pharynx are normal.  The neck is supple.  The carotids reveal no bruits.  The jugular venous pressure is normal.  The  thyroid is not enlarged.  There is no lymphadenopathy.  The chest is clear to percussion and auscultation.  There are no rales or rhonchi.  Expansion of the chest is symmetrical.  The precordium is quiet.  The first heart sound is normal.  The second heart sound is physiologically split.  There is no murmur gallop rub or click.  There is no abnormal lift or heave.  The abdomen is soft and nontender.  The bowel sounds are normal.  The liver and spleen are not enlarged.  There are no abdominal masses.  There are no abdominal bruits.  Extremities reveal good pedal pulses.  There is no phlebitis or edema.  There is no cyanosis or clubbing.  Strength is normal and symmetrical in all  extremities.  There is no lateralizing weakness.  There are no sensory deficits.  The skin is warm and dry.  There is no rash.  EKG today shows sinus rhythm with incomplete right bundle branch block.  There is no previous EKG available for comparison.   Assessment / Plan: We are checking blood work today.  Continue same medication.  Work harder at [employer].  Recheck in 4 months for office visit lipid panel hepatic function panel and basal metabolic panel

## 2013-08-28 NOTE — Progress Notes (Signed)
Quick Note:  Please report to patient. The recent labs are stable. Continue same medication and careful diet. The serum sodium is lower. Try to increase dietary salt. Limit fluids to about 1500 cc daily to bring serum sodium level up. ______

## 2013-08-30 ENCOUNTER — Telehealth: Payer: Self-pay | Admitting: Cardiology

## 2013-08-30 NOTE — Telephone Encounter (Signed)
Advised patient of lab results  

## 2013-08-30 NOTE — Telephone Encounter (Signed)
New problem:  Pt states she is calling MeLinda back in reference to her lab work. Pt is requesting a call back.

## 2013-08-30 NOTE — Telephone Encounter (Signed)
Message copied by Burnell Blanks on Wed Aug 30, 2013  9:43 AM ------      Message from: Cassell Clement      Created: Mon Aug 28, 2013  9:19 PM       Please report to patient.  The recent labs are stable. Continue same medication and careful diet. The serum sodium is lower. Try to increase dietary salt. Limit fluids to about 1500 cc daily to bring serum sodium level up. ------

## 2013-09-04 ENCOUNTER — Telehealth: Payer: Self-pay | Admitting: Cardiology

## 2013-09-04 NOTE — Telephone Encounter (Signed)
Spoke with patient who states she called the pharmacy and was able to get a 30 day supply of the Zetia instead of a 90 day supply.  Patient states she thought we were going to have to call it in for her; she thanked me for calling and states she will call back if needed.

## 2013-09-04 NOTE — Telephone Encounter (Signed)
Okay to restart ezetimibe 10 mg one daily

## 2013-09-04 NOTE — Telephone Encounter (Signed)
New message    Pt thinks she needs to start taking zetia again---she stopped because it was expensive.

## 2013-10-31 ENCOUNTER — Telehealth: Payer: Self-pay | Admitting: Cardiology

## 2013-10-31 DIAGNOSIS — Z9181 History of falling: Secondary | ICD-10-CM

## 2013-10-31 DIAGNOSIS — R0781 Pleurodynia: Secondary | ICD-10-CM

## 2013-10-31 NOTE — Telephone Encounter (Signed)
Advised patient, going tomorrow around noon.

## 2013-10-31 NOTE — Telephone Encounter (Signed)
Patient had a fall over the weekend and is now having a lot of pain around ribcage area towards the back. States very painful and Ibuprofen does not even help. Patient is concerned and wonders if she needs an xray. Will forward to  Dr. Mare Ferrari for review

## 2013-10-31 NOTE — Telephone Encounter (Signed)
New message   Patient think she broke a rib  . Due to recent fall on Saturday.  No PCP . See a MD a [facility]. In a lot of pain.

## 2013-10-31 NOTE — Telephone Encounter (Signed)
Follow up    Patient would like a call back before you nurse leaves today

## 2013-10-31 NOTE — Telephone Encounter (Signed)
Would obtain chest x-ray

## 2013-11-01 ENCOUNTER — Ambulatory Visit
Admission: RE | Admit: 2013-11-01 | Discharge: 2013-11-01 | Disposition: A | Discharge status: Home / Self Care | Payer: Medicare Other | Source: Ambulatory Visit | Attending: Cardiology | Admitting: Cardiology

## 2013-11-01 DIAGNOSIS — R0781 Pleurodynia: Secondary | ICD-10-CM

## 2013-11-01 DIAGNOSIS — Z9181 History of falling: Secondary | ICD-10-CM

## 2013-11-01 NOTE — Telephone Encounter (Signed)
Advised patient

## 2013-11-01 NOTE — Telephone Encounter (Signed)
Message copied by Earvin Hansen on Wed Nov 01, 2013  6:16 PM ------      Message from: Darlin Coco      Created: Wed Nov 01, 2013  5:42 PM       Please report.  The chest x-ray is unchanged from prior x-rays.  They did not see any sign of a rib fracture but this of course does not rule out a tiny hairline fracture from her fall.  Treatment is the same nonetheless which is waiting for her to gradually get better with time ------

## 2013-11-06 ENCOUNTER — Ambulatory Visit: Payer: Medicare Other | Admitting: Podiatry

## 2013-11-24 ENCOUNTER — Other Ambulatory Visit: Payer: Self-pay | Admitting: Cardiology

## 2013-11-24 DIAGNOSIS — G4700 Insomnia, unspecified: Secondary | ICD-10-CM

## 2013-12-06 ENCOUNTER — Encounter: Payer: Self-pay | Admitting: Cardiology

## 2013-12-14 ENCOUNTER — Ambulatory Visit (INDEPENDENT_AMBULATORY_CARE_PROVIDER_SITE_OTHER): Payer: Medicare Other | Admitting: Podiatry

## 2013-12-14 ENCOUNTER — Encounter: Payer: Self-pay | Admitting: Podiatry

## 2013-12-14 VITALS — BP 128/80 | HR 67 | Resp 16

## 2013-12-14 DIAGNOSIS — B351 Tinea unguium: Secondary | ICD-10-CM

## 2013-12-14 DIAGNOSIS — M79609 Pain in unspecified limb: Secondary | ICD-10-CM

## 2013-12-15 NOTE — Progress Notes (Signed)
Subjective:     Patient ID: Jenny Torres, female   DOB: [birth year], 80 y.o.   MRN: [id number]  HPI patient presents with significant neuropathic changes in both feet with nail disease 1-5 both feet that she cannot take care of due to thickness and loss of hand strength   Review of Systems     Objective:   Physical Exam Neurovascular status unchanged with thick nailbeds 1-5 both feet that are painful and incurvated in the corners with peripheral type nailbeds    Assessment:     Mycotic nail infection with pain 1-5 both feet    Plan:     Debridement painful nailbeds 1-5 both feet with no bleeding noted

## 2013-12-29 ENCOUNTER — Other Ambulatory Visit: Payer: Self-pay | Admitting: Cardiology

## 2014-01-03 ENCOUNTER — Other Ambulatory Visit: Payer: Self-pay | Admitting: Cardiology

## 2014-01-15 ENCOUNTER — Ambulatory Visit (INDEPENDENT_AMBULATORY_CARE_PROVIDER_SITE_OTHER): Payer: Medicare Other | Admitting: Cardiology

## 2014-01-15 ENCOUNTER — Encounter: Payer: Self-pay | Admitting: Cardiology

## 2014-01-15 ENCOUNTER — Other Ambulatory Visit: Payer: Medicare Other

## 2014-01-15 VITALS — BP 116/54 | HR 62 | Ht 62.0 in | Wt 88.0 lb

## 2014-01-15 DIAGNOSIS — I059 Rheumatic mitral valve disease, unspecified: Secondary | ICD-10-CM

## 2014-01-15 DIAGNOSIS — I341 Nonrheumatic mitral (valve) prolapse: Secondary | ICD-10-CM

## 2014-01-15 DIAGNOSIS — E871 Hypo-osmolality and hyponatremia: Secondary | ICD-10-CM

## 2014-01-15 DIAGNOSIS — I7300 Raynaud's syndrome without gangrene: Secondary | ICD-10-CM

## 2014-01-15 DIAGNOSIS — I10 Essential (primary) hypertension: Secondary | ICD-10-CM

## 2014-01-15 DIAGNOSIS — E7800 Pure hypercholesterolemia, unspecified: Secondary | ICD-10-CM

## 2014-01-15 DIAGNOSIS — E785 Hyperlipidemia, unspecified: Secondary | ICD-10-CM

## 2014-01-15 LAB — LIPID PANEL
Cholesterol: 218 mg/dL — ABNORMAL HIGH (ref 0–200)
HDL: 84.5 mg/dL (ref >39.00)
LDL Cholesterol: 120 mg/dL — ABNORMAL HIGH (ref 0–99)
Total CHOL/HDL Ratio: 3
Triglycerides: 66 mg/dL (ref 0.0–149.0)
VLDL: 13.2 mg/dL (ref 0.0–40.0)

## 2014-01-15 LAB — HEPATIC FUNCTION PANEL
ALT: 19 U/L (ref 0–35)
AST: 32 U/L (ref 0–37)
Albumin: 4.1 g/dL (ref 3.5–5.2)
Alkaline Phosphatase: 95 U/L (ref 39–117)
BILIRUBIN DIRECT: 0.2 mg/dL (ref 0.0–0.3)
Total Bilirubin: 0.8 mg/dL (ref 0.3–1.2)
Total Protein: 6.8 g/dL (ref 6.0–8.3)

## 2014-01-15 LAB — BASIC METABOLIC PANEL
BUN: 7 mg/dL (ref 6–23)
CALCIUM: 9.1 mg/dL (ref 8.4–10.5)
CO2: 30 mEq/L (ref 19–32)
CREATININE: 0.4 mg/dL (ref 0.4–1.2)
Chloride: 88 mEq/L — ABNORMAL LOW (ref 96–112)
GFR: 176.89 mL/min (ref >60.00)
Glucose, Bld: 72 mg/dL (ref 70–99)
Potassium: 4.1 mEq/L (ref 3.5–5.1)
Sodium: 125 mEq/L — ABNORMAL LOW (ref 135–145)

## 2014-01-15 NOTE — Patient Instructions (Addendum)

## 2014-01-15 NOTE — Progress Notes (Addendum)
Jenny Torres Date of Birth:  22-Nov-1927 [address] [place], [place]  [address] [phone]         Fax   [phone]  History of Present Illness: This pleasant 80 year old woman is seen for a scheduled four-month followup office visit. He has a past history of palpitations and mitral valve prolapse. Also has a history of essential hypertension and hypercholesterolemia. She has a history of Raynaud's disease. She has had situational depression related to ongoing stress regarding her husband's dementia. She has a history of allergies and past history of asthma. She has a past history of low vitamin B12 levels first noted by her neurologist at [facility].  She has vitamin B12 injections on hand at home but has not taken any for the past 6 months or more.  She has had difficulty in gaining weight.  However today her weight is up 1 pound since last visit.    Current Outpatient Prescriptions  Medication Sig Dispense Refill  . albuterol (PROVENTIL HFA;VENTOLIN HFA) 108 (90 BASE) MCG/ACT inhaler Inhale 2 puffs into the lungs every 6 (six) hours as needed for wheezing.  1 Inhaler  0  . aspirin 81 MG tablet Take 81 mg by mouth daily.        . Cholecalciferol (VITAMIN D3) 1000 UNITS CAPS Take 1 capsule by mouth.      . cyanocobalamin (,VITAMIN B-12,) 1000 MCG/ML injection 1 cc weekly x 4 weeks and then monthly  30 mL  1  . fluocinonide (LIDEX) 0.05 % cream Apply topically 2 (two) times daily. As directed by dermatologist       . fluticasone (FLONASE) 50 MCG/ACT nasal spray Place 1 spray into the nose daily as needed.      . fluticasone (FLONASE) 50 MCG/ACT nasal spray ONE SPRAY ONCE A DAY INTO NOSE  16 g  prn  . gabapentin (NEURONTIN) 800 MG tablet TAKE 1 TABLET THREE TIMES A DAY AS NEEDED  270 tablet  3  . LORazepam (ATIVAN) 1 MG tablet TAKE 1/2 TO 1 TABLET DAILY AS NEEDED  60 tablet  3  . metoprolol tartrate (LOPRESSOR) 25 MG tablet TAKE ONE-HALF (1/2) TABLET (12.5 MG TOTAL) TWICE A  DAY  90 tablet  0  . Multiple Vitamins-Minerals (MULTIVITAL) tablet Take 1 tablet by mouth daily.        . ondansetron (ZOFRAN-ODT) 8 MG disintegrating tablet TAKE ONE TABLET AS NEEDED  10 tablet  1  . Psyllium (METAMUCIL) 30.9 % POWD Take by mouth daily.        . sertraline (ZOLOFT) 25 MG tablet TAKE ONE TABLET BY MOUTH ONCE DAILY  30 tablet  6  . zolpidem (AMBIEN CR) 12.5 MG CR tablet TAKE ONE TABLET AT BEDTIME AS NEEDED  30 tablet  5   No current facility-administered medications for this visit.    Allergies  Allergen Reactions  . Sulfamethoxazole Swelling    Swelling of the throat, mouth, tongue, nose  . Ceftin     GI problems  . Darvocet [Propoxyphene N-Acetaminophen] Itching  . Erythromycin     Upset stomach  . Hydrocod Polst-Cpm Polst Er     nausea  . Lovastatin   . Paroxetine Hcl     jittery  . Penicillins   . Theophyllines   . Zovirax [Acyclovir]     Per patient lots of side effects    Patient Active Problem List   Diagnosis Date Noted  . Low vitamin B12 level 12/29/2012  .  Pulmonary nodule 10/21/2012  . Idiopathic peripheral neuropathy   . MVP (mitral valve prolapse)   . Osteoporosis   . Raynaud's disease /phenomenon 04/24/2011  . UNSPECIFIED RETINAL VASCULAR OCCLUSION 05/22/2010  . HYPERLIPIDEMIA 11/10/2007  . ANXIETY 11/10/2007  . DEPRESSION 11/10/2007  . HYPERTENSION 11/10/2007  . COPD 11/10/2007  . GASTROESOPHAGEAL REFLUX DISEASE 11/10/2007  . EXTERNAL HEMORRHOIDS 04/08/2004  . DIVERTICULOSIS, COLON 04/08/2004  . POLYP, COLON 04/16/1998    History  Smoking status  . Former Smoker -- 1.00 packs/day for 25 years  . Types: Cigarettes  . Quit date: 09/21/1982  Smokeless tobacco  . Never Used    History  Alcohol Use  . Yes    Comment: rarely    Family History  Problem Relation Age of Onset  . Breast cancer Mother   . Liver disease Brother   . Cirrhosis Brother     Review of Systems: Constitutional: no fever chills diaphoresis or  fatigue or change in weight.  Head and neck: no hearing loss, no epistaxis, no photophobia or visual disturbance. Respiratory: No cough, shortness of breath or wheezing. Cardiovascular: No chest pain peripheral edema, palpitations. Gastrointestinal: No abdominal distention, no abdominal pain, no change in bowel habits hematochezia or melena. Genitourinary: No dysuria, no frequency, no urgency, no nocturia. Musculoskeletal:No arthralgias, no back pain, no gait disturbance or myalgias. Neurological: No dizziness, no headaches, no numbness, no seizures, no syncope, no weakness, no tremors. Hematologic: No lymphadenopathy, no easy bruising. Psychiatric: No confusion, no hallucinations, no sleep disturbance.    Physical Exam: Filed Vitals:   01/15/14 1155  BP: 116/54  Pulse: 62   the general appearance reveals a well-developed well-nourished thin woman in no distress.The head and neck exam reveals pupils equal and reactive.  Extraocular movements are full.  There is no scleral icterus.  The mouth and pharynx are normal.  The neck is supple.  The carotids reveal no bruits.  The jugular venous pressure is normal.  The  thyroid is not enlarged.  There is no lymphadenopathy.  The chest is clear to percussion and auscultation.  There are no rales or rhonchi.  Expansion of the chest is symmetrical.  The precordium is quiet.  The first heart sound is normal.  The second heart sound is physiologically split.  There is no murmur gallop rub or click.  There is no abnormal lift or heave.  The abdomen is soft and nontender.  The bowel sounds are normal.  The liver and spleen are not enlarged.  There are no abdominal masses.  There are no abdominal bruits.  Extremities reveal good pedal pulses.  There is no phlebitis or edema.  There is no cyanosis or clubbing.  Strength is normal and symmetrical in all extremities.  There is no lateralizing weakness.  There are no sensory deficits.  The skin is warm and dry.  There  is no rash.    Assessment / Plan: Blood work today is pending.  She also had the sad news that her son has been diagnosed with ALS.  Her husband is still in the skilled nursing facility under the care of Dr. Mallie Mussel trip.  He resides in the [facility]. Recheck in 4 months for followup office visit lipid panel hepatic function panel and basal metabolic panel.

## 2014-01-15 NOTE — Assessment & Plan Note (Signed)
She continues to have severe Raynaud's phenomenon

## 2014-01-15 NOTE — Progress Notes (Signed)
Quick Note:  Please report to patient. The recent labs are stable. Continue same medication and careful diet. Serum sodium is still low. Continue to ingest plenty of salt. Try to limit fluids to 1800 cc daily. The Zoloft may be contributing to the hyponatremia but now is probably not a good time to stop it. ______

## 2014-01-15 NOTE — Assessment & Plan Note (Signed)
Blood pressure is stable on current therapy.  No chest pain or shortness of breath

## 2014-01-15 NOTE — Assessment & Plan Note (Signed)
She has a history of hypercholesterolemia.  She is not on statins because of her previous myopathy and peripheral neuropathy.  She is on high-dose gabapentin 800 mg 2 or 3 times a day.

## 2014-01-15 NOTE — Assessment & Plan Note (Signed)
She's not having a problem with palpitations or tachycardia.

## 2014-02-11 ENCOUNTER — Encounter (HOSPITAL_COMMUNITY)
Admission: EM | Disposition: A | Discharge status: Skilled Nursing Facility | Payer: Self-pay | Source: Home / Self Care | Attending: Internal Medicine

## 2014-02-11 ENCOUNTER — Emergency Department (HOSPITAL_COMMUNITY): Payer: Medicare Other

## 2014-02-11 ENCOUNTER — Inpatient Hospital Stay (HOSPITAL_COMMUNITY)
Admission: EM | Admit: 2014-02-11 | Discharge: 2014-02-14 | DRG: 480 | Disposition: A | Discharge status: Skilled Nursing Facility | Payer: Medicare Other | Attending: Internal Medicine | Admitting: Internal Medicine

## 2014-02-11 ENCOUNTER — Inpatient Hospital Stay (HOSPITAL_COMMUNITY): Payer: Medicare Other

## 2014-02-11 ENCOUNTER — Inpatient Hospital Stay (HOSPITAL_COMMUNITY): Payer: Medicare Other | Admitting: Anesthesiology

## 2014-02-11 ENCOUNTER — Encounter (HOSPITAL_COMMUNITY): Payer: Self-pay | Admitting: Emergency Medicine

## 2014-02-11 ENCOUNTER — Encounter (HOSPITAL_COMMUNITY): Payer: Medicare Other | Admitting: Anesthesiology

## 2014-02-11 DIAGNOSIS — S7291XA Unspecified fracture of right femur, initial encounter for closed fracture: Secondary | ICD-10-CM

## 2014-02-11 DIAGNOSIS — D62 Acute posthemorrhagic anemia: Secondary | ICD-10-CM

## 2014-02-11 DIAGNOSIS — S72141A Displaced intertrochanteric fracture of right femur, initial encounter for closed fracture: Secondary | ICD-10-CM | POA: Diagnosis present

## 2014-02-11 DIAGNOSIS — G609 Hereditary and idiopathic neuropathy, unspecified: Secondary | ICD-10-CM

## 2014-02-11 DIAGNOSIS — D126 Benign neoplasm of colon, unspecified: Secondary | ICD-10-CM

## 2014-02-11 DIAGNOSIS — Z803 Family history of malignant neoplasm of breast: Secondary | ICD-10-CM

## 2014-02-11 DIAGNOSIS — Z87891 Personal history of nicotine dependence: Secondary | ICD-10-CM

## 2014-02-11 DIAGNOSIS — Y92009 Unspecified place in unspecified non-institutional (private) residence as the place of occurrence of the external cause: Secondary | ICD-10-CM

## 2014-02-11 DIAGNOSIS — Z888 Allergy status to other drugs, medicaments and biological substances status: Secondary | ICD-10-CM

## 2014-02-11 DIAGNOSIS — I10 Essential (primary) hypertension: Secondary | ICD-10-CM

## 2014-02-11 DIAGNOSIS — M810 Age-related osteoporosis without current pathological fracture: Secondary | ICD-10-CM | POA: Diagnosis present

## 2014-02-11 DIAGNOSIS — E538 Deficiency of other specified B group vitamins: Secondary | ICD-10-CM

## 2014-02-11 DIAGNOSIS — Z860100 Personal history of colon polyps, unspecified: Secondary | ICD-10-CM

## 2014-02-11 DIAGNOSIS — W19XXXA Unspecified fall, initial encounter: Secondary | ICD-10-CM

## 2014-02-11 DIAGNOSIS — J449 Chronic obstructive pulmonary disease, unspecified: Secondary | ICD-10-CM | POA: Diagnosis present

## 2014-02-11 DIAGNOSIS — R110 Nausea: Secondary | ICD-10-CM | POA: Diagnosis not present

## 2014-02-11 DIAGNOSIS — S72143A Displaced intertrochanteric fracture of unspecified femur, initial encounter for closed fracture: Principal | ICD-10-CM | POA: Diagnosis present

## 2014-02-11 DIAGNOSIS — W010XXA Fall on same level from slipping, tripping and stumbling without subsequent striking against object, initial encounter: Secondary | ICD-10-CM | POA: Diagnosis present

## 2014-02-11 DIAGNOSIS — I7300 Raynaud's syndrome without gangrene: Secondary | ICD-10-CM

## 2014-02-11 DIAGNOSIS — I341 Nonrheumatic mitral (valve) prolapse: Secondary | ICD-10-CM

## 2014-02-11 DIAGNOSIS — R911 Solitary pulmonary nodule: Secondary | ICD-10-CM

## 2014-02-11 DIAGNOSIS — Z79899 Other long term (current) drug therapy: Secondary | ICD-10-CM

## 2014-02-11 DIAGNOSIS — K5730 Diverticulosis of large intestine without perforation or abscess without bleeding: Secondary | ICD-10-CM

## 2014-02-11 DIAGNOSIS — Z66 Do not resuscitate: Secondary | ICD-10-CM | POA: Diagnosis present

## 2014-02-11 DIAGNOSIS — E785 Hyperlipidemia, unspecified: Secondary | ICD-10-CM

## 2014-02-11 DIAGNOSIS — F3289 Other specified depressive episodes: Secondary | ICD-10-CM

## 2014-02-11 DIAGNOSIS — H349 Unspecified retinal vascular occlusion: Secondary | ICD-10-CM

## 2014-02-11 DIAGNOSIS — F329 Major depressive disorder, single episode, unspecified: Secondary | ICD-10-CM

## 2014-02-11 DIAGNOSIS — Z7901 Long term (current) use of anticoagulants: Secondary | ICD-10-CM

## 2014-02-11 DIAGNOSIS — Z7982 Long term (current) use of aspirin: Secondary | ICD-10-CM

## 2014-02-11 DIAGNOSIS — Z881 Allergy status to other antibiotic agents status: Secondary | ICD-10-CM

## 2014-02-11 DIAGNOSIS — Z602 Problems related to living alone: Secondary | ICD-10-CM

## 2014-02-11 DIAGNOSIS — Z9089 Acquired absence of other organs: Secondary | ICD-10-CM

## 2014-02-11 DIAGNOSIS — F411 Generalized anxiety disorder: Secondary | ICD-10-CM

## 2014-02-11 DIAGNOSIS — E43 Unspecified severe protein-calorie malnutrition: Secondary | ICD-10-CM

## 2014-02-11 DIAGNOSIS — F341 Dysthymic disorder: Secondary | ICD-10-CM | POA: Diagnosis present

## 2014-02-11 DIAGNOSIS — I6529 Occlusion and stenosis of unspecified carotid artery: Secondary | ICD-10-CM | POA: Diagnosis present

## 2014-02-11 DIAGNOSIS — K219 Gastro-esophageal reflux disease without esophagitis: Secondary | ICD-10-CM

## 2014-02-11 DIAGNOSIS — Z880 Allergy status to penicillin: Secondary | ICD-10-CM

## 2014-02-11 DIAGNOSIS — E871 Hypo-osmolality and hyponatremia: Secondary | ICD-10-CM

## 2014-02-11 DIAGNOSIS — D72829 Elevated white blood cell count, unspecified: Secondary | ICD-10-CM

## 2014-02-11 DIAGNOSIS — J4489 Other specified chronic obstructive pulmonary disease: Secondary | ICD-10-CM

## 2014-02-11 DIAGNOSIS — R7989 Other specified abnormal findings of blood chemistry: Secondary | ICD-10-CM

## 2014-02-11 DIAGNOSIS — K644 Residual hemorrhoidal skin tags: Secondary | ICD-10-CM

## 2014-02-11 HISTORY — PX: INTRAMEDULLARY (IM) NAIL INTERTROCHANTERIC: SHX5875

## 2014-02-11 LAB — CBC WITH DIFFERENTIAL/PLATELET
BASOS ABS: 0 10*3/uL (ref 0.0–0.1)
Basophils Relative: 0 % (ref 0–1)
Eosinophils Absolute: 0.1 10*3/uL (ref 0.0–0.7)
Eosinophils Relative: 1 % (ref 0–5)
HEMATOCRIT: 39.2 % (ref 36.0–46.0)
HEMOGLOBIN: 14.1 g/dL (ref 12.0–15.0)
LYMPHS ABS: 1.1 10*3/uL (ref 0.7–4.0)
Lymphocytes Relative: 16 % (ref 12–46)
MCH: 30.9 pg (ref 26.0–34.0)
MCHC: 36 g/dL (ref 30.0–36.0)
MCV: 85.8 fL (ref 78.0–100.0)
MONO ABS: 0.7 10*3/uL (ref 0.1–1.0)
Monocytes Relative: 11 % (ref 3–12)
NEUTROS ABS: 5 10*3/uL (ref 1.7–7.7)
Neutrophils Relative %: 72 % (ref 43–77)
Platelets: 208 10*3/uL (ref 150–400)
RBC: 4.57 MIL/uL (ref 3.87–5.11)
RDW: 12.4 % (ref 11.5–15.5)
WBC: 7 10*3/uL (ref 4.0–10.5)

## 2014-02-11 LAB — BASIC METABOLIC PANEL
BUN: 10 mg/dL (ref 6–23)
CHLORIDE: 88 meq/L — AB (ref 96–112)
CO2: 25 meq/L (ref 19–32)
Calcium: 9.6 mg/dL (ref 8.4–10.5)
Creatinine, Ser: 0.49 mg/dL — ABNORMAL LOW (ref 0.50–1.10)
GFR calc non Af Amer: 88 mL/min — ABNORMAL LOW (ref >90)
Glucose, Bld: 107 mg/dL — ABNORMAL HIGH (ref 70–99)
POTASSIUM: 3.7 meq/L (ref 3.7–5.3)
Sodium: 128 mEq/L — ABNORMAL LOW (ref 137–147)

## 2014-02-11 LAB — ABO/RH: ABO/RH(D): O POS

## 2014-02-11 LAB — TYPE AND SCREEN
ABO/RH(D): O POS
Antibody Screen: NEGATIVE

## 2014-02-11 LAB — PROTIME-INR
INR: 0.99 (ref 0.00–1.49)
Prothrombin Time: 12.9 seconds (ref 11.6–15.2)

## 2014-02-11 SURGERY — FIXATION, FRACTURE, INTERTROCHANTERIC, WITH INTRAMEDULLARY ROD
Anesthesia: General | Site: Hip | Laterality: Right

## 2014-02-11 MED ORDER — CEFAZOLIN SODIUM-DEXTROSE 2-3 GM-% IV SOLR
INTRAVENOUS | Status: AC
  Filled 2014-02-11: qty 50

## 2014-02-11 MED ORDER — 0.9 % SODIUM CHLORIDE (POUR BTL) OPTIME
TOPICAL | Status: DC | PRN
  Administered 2014-02-11: 1000 mL

## 2014-02-11 MED ORDER — LIDOCAINE HCL (CARDIAC) 20 MG/ML IV SOLN
INTRAVENOUS | Status: AC
  Filled 2014-02-11: qty 5

## 2014-02-11 MED ORDER — DEXAMETHASONE SODIUM PHOSPHATE 4 MG/ML IJ SOLN
INTRAMUSCULAR | Status: DC | PRN
  Administered 2014-02-11: 4 mg via INTRAVENOUS

## 2014-02-11 MED ORDER — HYDROMORPHONE HCL PF 1 MG/ML IJ SOLN: 0.2500 mg | INTRAMUSCULAR | Status: DC | PRN

## 2014-02-11 MED ORDER — PROPOFOL 10 MG/ML IV BOLUS
INTRAVENOUS | Status: AC
  Filled 2014-02-11: qty 20

## 2014-02-11 MED ORDER — EPHEDRINE SULFATE 50 MG/ML IJ SOLN
INTRAMUSCULAR | Status: DC | PRN
  Administered 2014-02-11 (×3): 10 mg via INTRAVENOUS

## 2014-02-11 MED ORDER — LABETALOL HCL 5 MG/ML IV SOLN
INTRAVENOUS | Status: AC
  Filled 2014-02-11: qty 4

## 2014-02-11 MED ORDER — SUFENTANIL CITRATE 50 MCG/ML IV SOLN
INTRAVENOUS | Status: DC | PRN
  Administered 2014-02-11: 10 ug via INTRAVENOUS

## 2014-02-11 MED ORDER — LIDOCAINE HCL (CARDIAC) 20 MG/ML IV SOLN
INTRAVENOUS | Status: DC | PRN
  Administered 2014-02-11: 40 mg via INTRAVENOUS

## 2014-02-11 MED ORDER — SENNA 8.6 MG PO TABS
1.0000 | ORAL_TABLET | Freq: Two times a day (BID) | ORAL | Status: DC
  Administered 2014-02-12 – 2014-02-14 (×5): 8.6 mg via ORAL
  Filled 2014-02-11 (×7): qty 1

## 2014-02-11 MED ORDER — METOPROLOL TARTRATE 12.5 MG HALF TABLET
12.5000 mg | ORAL_TABLET | Freq: Two times a day (BID) | ORAL | Status: DC
  Administered 2014-02-12: 12.5 mg via ORAL
  Filled 2014-02-11 (×4): qty 1

## 2014-02-11 MED ORDER — POLYETHYLENE GLYCOL 3350 17 G PO PACK
17.0000 g | PACK | Freq: Every day | ORAL | Status: DC | PRN
  Administered 2014-02-14: 17 g via ORAL

## 2014-02-11 MED ORDER — PROMETHAZINE HCL 25 MG/ML IJ SOLN
12.5000 mg | Freq: Once | INTRAMUSCULAR | Status: AC
  Administered 2014-02-11: 12.5 mg via INTRAVENOUS
  Filled 2014-02-11: qty 1

## 2014-02-11 MED ORDER — SODIUM CHLORIDE 0.9 % IV BOLUS (SEPSIS): 1000.0000 mL | Freq: Once | INTRAVENOUS | Status: DC

## 2014-02-11 MED ORDER — ONDANSETRON HCL 4 MG/2ML IJ SOLN: 4.0000 mg | Freq: Once | INTRAMUSCULAR | Status: DC

## 2014-02-11 MED ORDER — ONDANSETRON HCL 4 MG/2ML IJ SOLN
4.0000 mg | Freq: Four times a day (QID) | INTRAMUSCULAR | Status: DC | PRN
  Filled 2014-02-11: qty 2

## 2014-02-11 MED ORDER — MENTHOL 3 MG MT LOZG: 1.0000 | LOZENGE | OROMUCOSAL | Status: DC | PRN

## 2014-02-11 MED ORDER — PHENOL 1.4 % MT LIQD: 1.0000 | OROMUCOSAL | Status: DC | PRN

## 2014-02-11 MED ORDER — METOPROLOL TARTRATE 1 MG/ML IV SOLN: 5.0000 mg | Freq: Four times a day (QID) | INTRAVENOUS | Status: DC | PRN

## 2014-02-11 MED ORDER — ALBUMIN HUMAN 5 % IV SOLN
INTRAVENOUS | Status: DC | PRN
  Administered 2014-02-11: 20:00:00 via INTRAVENOUS

## 2014-02-11 MED ORDER — ONDANSETRON HCL 4 MG/2ML IJ SOLN
4.0000 mg | Freq: Four times a day (QID) | INTRAMUSCULAR | Status: DC | PRN
  Administered 2014-02-12: 4 mg via INTRAVENOUS

## 2014-02-11 MED ORDER — ENOXAPARIN SODIUM 40 MG/0.4ML ~~LOC~~ SOLN: 40.0000 mg | SUBCUTANEOUS | Status: DC

## 2014-02-11 MED ORDER — PROPOFOL 10 MG/ML IV BOLUS
INTRAVENOUS | Status: DC | PRN
  Administered 2014-02-11: 50 mg via INTRAVENOUS
  Administered 2014-02-11: 10 mg via INTRAVENOUS
  Administered 2014-02-11: 90 mg via INTRAVENOUS

## 2014-02-11 MED ORDER — ACETAMINOPHEN 325 MG PO TABS
650.0000 mg | ORAL_TABLET | Freq: Four times a day (QID) | ORAL | Status: DC | PRN
  Administered 2014-02-13: 650 mg via ORAL
  Filled 2014-02-11 (×2): qty 2

## 2014-02-11 MED ORDER — ACETAMINOPHEN 650 MG RE SUPP: 650.0000 mg | Freq: Four times a day (QID) | RECTAL | Status: DC | PRN

## 2014-02-11 MED ORDER — METOPROLOL TARTRATE 1 MG/ML IV SOLN
5.0000 mg | Freq: Once | INTRAVENOUS | Status: DC
Start: 2014-02-11 — End: 2014-02-11
  Administered 2014-02-11: 5 mg via INTRAVENOUS
  Filled 2014-02-11: qty 5

## 2014-02-11 MED ORDER — PHENYLEPHRINE HCL 10 MG/ML IJ SOLN
INTRAMUSCULAR | Status: DC | PRN
  Administered 2014-02-11: 120 ug via INTRAVENOUS
  Administered 2014-02-11: 160 ug via INTRAVENOUS
  Administered 2014-02-11: 120 ug via INTRAVENOUS

## 2014-02-11 MED ORDER — CEFAZOLIN SODIUM-DEXTROSE 2-3 GM-% IV SOLR
INTRAVENOUS | Status: DC | PRN
  Administered 2014-02-11: 2 g via INTRAVENOUS

## 2014-02-11 MED ORDER — GABAPENTIN 800 MG PO TABS
800.0000 mg | ORAL_TABLET | Freq: Three times a day (TID) | ORAL | Status: DC
  Filled 2014-02-11 (×2): qty 1

## 2014-02-11 MED ORDER — LORAZEPAM 0.5 MG PO TABS
0.5000 mg | ORAL_TABLET | Freq: Every day | ORAL | Status: DC | PRN
  Administered 2014-02-13: 0.5 mg via ORAL
  Filled 2014-02-11: qty 1

## 2014-02-11 MED ORDER — ALBUTEROL SULFATE (2.5 MG/3ML) 0.083% IN NEBU: 3.0000 mL | INHALATION_SOLUTION | Freq: Four times a day (QID) | RESPIRATORY_TRACT | Status: DC | PRN

## 2014-02-11 MED ORDER — FENTANYL CITRATE 0.05 MG/ML IJ SOLN
25.0000 ug | INTRAMUSCULAR | Status: DC | PRN
  Administered 2014-02-12: 25 ug via INTRAVENOUS
  Filled 2014-02-11: qty 2

## 2014-02-11 MED ORDER — EPHEDRINE SULFATE 50 MG/ML IJ SOLN
INTRAMUSCULAR | Status: AC
  Filled 2014-02-11: qty 1

## 2014-02-11 MED ORDER — SUCCINYLCHOLINE CHLORIDE 20 MG/ML IJ SOLN
INTRAMUSCULAR | Status: DC | PRN
  Administered 2014-02-11: 80 mg via INTRAVENOUS

## 2014-02-11 MED ORDER — PHENYLEPHRINE 40 MCG/ML (10ML) SYRINGE FOR IV PUSH (FOR BLOOD PRESSURE SUPPORT)
PREFILLED_SYRINGE | INTRAVENOUS | Status: AC
  Filled 2014-02-11: qty 10

## 2014-02-11 MED ORDER — SODIUM CHLORIDE 0.9 % IV SOLN
INTRAVENOUS | Status: DC
  Administered 2014-02-12 (×2): via INTRAVENOUS

## 2014-02-11 MED ORDER — FENTANYL CITRATE 0.05 MG/ML IJ SOLN
25.0000 ug | INTRAMUSCULAR | Status: DC | PRN
  Administered 2014-02-11 (×2): 25 ug via INTRAVENOUS
  Filled 2014-02-11 (×3): qty 2

## 2014-02-11 MED ORDER — HYDROMORPHONE HCL PF 2 MG/ML IJ SOLN: 2.0000 mg | INTRAMUSCULAR | Status: DC | PRN

## 2014-02-11 MED ORDER — ONDANSETRON HCL 4 MG/2ML IJ SOLN
4.0000 mg | Freq: Once | INTRAMUSCULAR | Status: AC
  Administered 2014-02-11: 4 mg via INTRAVENOUS
  Filled 2014-02-11: qty 2

## 2014-02-11 MED ORDER — LACTATED RINGERS IV SOLN
INTRAVENOUS | Status: DC | PRN
  Administered 2014-02-11: 20:00:00 via INTRAVENOUS

## 2014-02-11 MED ORDER — ONDANSETRON HCL 4 MG/2ML IJ SOLN
INTRAMUSCULAR | Status: DC | PRN
  Administered 2014-02-11: 4 mg via INTRAVENOUS

## 2014-02-11 MED ORDER — DOCUSATE SODIUM 100 MG PO CAPS
100.0000 mg | ORAL_CAPSULE | Freq: Two times a day (BID) | ORAL | Status: DC
  Administered 2014-02-12 – 2014-02-14 (×5): 100 mg via ORAL
  Filled 2014-02-11 (×6): qty 1

## 2014-02-11 MED ORDER — SODIUM CHLORIDE 0.9 % IV SOLN
INTRAVENOUS | Status: DC | PRN
  Administered 2014-02-11: 19:00:00 via INTRAVENOUS

## 2014-02-11 MED ORDER — TRAMADOL HCL 50 MG PO TABS
50.0000 mg | ORAL_TABLET | Freq: Four times a day (QID) | ORAL | Status: DC | PRN
  Administered 2014-02-12: 100 mg via ORAL
  Administered 2014-02-13: 50 mg via ORAL
  Filled 2014-02-11: qty 1
  Filled 2014-02-11: qty 2

## 2014-02-11 MED ORDER — ONDANSETRON HCL 4 MG PO TABS: 4.0000 mg | ORAL_TABLET | Freq: Four times a day (QID) | ORAL | Status: DC | PRN

## 2014-02-11 MED ORDER — SODIUM CHLORIDE 0.9 % IJ SOLN
INTRAMUSCULAR | Status: AC
  Filled 2014-02-11: qty 10

## 2014-02-11 MED ORDER — SUCCINYLCHOLINE CHLORIDE 20 MG/ML IJ SOLN
INTRAMUSCULAR | Status: AC
  Filled 2014-02-11: qty 1

## 2014-02-11 MED ORDER — MORPHINE SULFATE 2 MG/ML IJ SOLN
0.5000 mg | INTRAMUSCULAR | Status: DC | PRN
  Administered 2014-02-12: 0.5 mg via INTRAVENOUS
  Filled 2014-02-11: qty 1

## 2014-02-11 MED ORDER — ENOXAPARIN SODIUM 30 MG/0.3ML ~~LOC~~ SOLN
30.0000 mg | SUBCUTANEOUS | Status: DC
  Administered 2014-02-13 – 2014-02-14 (×2): 30 mg via SUBCUTANEOUS
  Filled 2014-02-11 (×3): qty 0.3

## 2014-02-11 MED ORDER — LABETALOL HCL 5 MG/ML IV SOLN
INTRAVENOUS | Status: DC | PRN
  Administered 2014-02-11: 1 mg via INTRAVENOUS

## 2014-02-11 MED ORDER — DIPHENHYDRAMINE HCL 50 MG/ML IJ SOLN: 25.0000 mg | Freq: Once | INTRAMUSCULAR | Status: DC

## 2014-02-11 MED ORDER — SUFENTANIL CITRATE 50 MCG/ML IV SOLN
INTRAVENOUS | Status: AC
  Filled 2014-02-11: qty 1

## 2014-02-11 MED ORDER — ONDANSETRON HCL 4 MG/2ML IJ SOLN: 4.0000 mg | Freq: Once | INTRAMUSCULAR | Status: DC | PRN

## 2014-02-11 SURGICAL SUPPLY — 39 items
BIT DRILL CANN LG 4.3MM (BIT) IMPLANT
BLADE SURG 15 STRL LF DISP TIS (BLADE) ×1 IMPLANT
BLADE SURG 15 STRL SS (BLADE) ×3
CANISTER SUCT 3000ML (MISCELLANEOUS) ×3 IMPLANT
CHLORAPREP W/TINT 26ML (MISCELLANEOUS) ×3 IMPLANT
COVER PERINEAL POST (MISCELLANEOUS) ×3 IMPLANT
DRAPE STERI IOBAN 125X83 (DRAPES) ×3 IMPLANT
DRAPE U-SHAPE 47X51 STRL (DRAPES) ×2 IMPLANT
DRILL BIT CANN LG 4.3MM (BIT) ×3
DRSG MEPILEX BORDER 4X4 (GAUZE/BANDAGES/DRESSINGS) ×4 IMPLANT
DRSG MEPILEX BORDER 4X8 (GAUZE/BANDAGES/DRESSINGS) ×3 IMPLANT
ELECT REM PT RETURN 9FT ADLT (ELECTROSURGICAL) ×3
ELECTRODE REM PT RTRN 9FT ADLT (ELECTROSURGICAL) ×1 IMPLANT
GLOVE BIO SURGEON STRL SZ7 (GLOVE) ×3 IMPLANT
GLOVE BIO SURGEON STRL SZ8 (GLOVE) ×3 IMPLANT
GLOVE BIOGEL PI IND STRL 7.5 (GLOVE) ×1 IMPLANT
GLOVE BIOGEL PI IND STRL 8 (GLOVE) ×1 IMPLANT
GLOVE BIOGEL PI INDICATOR 7.5 (GLOVE) ×2
GLOVE BIOGEL PI INDICATOR 8 (GLOVE) ×2
GOWN STRL REUS W/ TWL LRG LVL3 (GOWN DISPOSABLE) ×1 IMPLANT
GOWN STRL REUS W/ TWL XL LVL3 (GOWN DISPOSABLE) ×2 IMPLANT
GOWN STRL REUS W/TWL LRG LVL3 (GOWN DISPOSABLE) ×3
GOWN STRL REUS W/TWL XL LVL3 (GOWN DISPOSABLE) ×6
HIP FRA NAIL LAG SCREW 10.5X90 (Orthopedic Implant) ×3 IMPLANT
KIT BASIN OR (CUSTOM PROCEDURE TRAY) ×3 IMPLANT
KIT ROOM TURNOVER OR (KITS) ×3 IMPLANT
NAIL HIP FRACT 130D 11X180 (Screw) ×2 IMPLANT
NS IRRIG 1000ML POUR BTL (IV SOLUTION) ×3 IMPLANT
PACK GENERAL/GYN (CUSTOM PROCEDURE TRAY) ×3 IMPLANT
PAD ARMBOARD 7.5X6 YLW CONV (MISCELLANEOUS) ×6 IMPLANT
PIN GUIDE 3.2 903003004 (MISCELLANEOUS) ×2 IMPLANT
SCREW LAG HIP FRA NAIL 10.5X90 (Orthopedic Implant) IMPLANT
SPONGE LAP 18X18 X RAY DECT (DISPOSABLE) ×3 IMPLANT
SUT MNCRL AB 3-0 PS2 18 (SUTURE) ×3 IMPLANT
SUT VIC AB 0 CT1 27 (SUTURE) ×3
SUT VIC AB 0 CT1 27XBRD ANBCTR (SUTURE) ×1 IMPLANT
SUT VIC AB 2-0 CT1 27 (SUTURE) ×3
SUT VIC AB 2-0 CT1 TAPERPNT 27 (SUTURE) ×1 IMPLANT
TRAY FOLEY CATH 16FRSI W/METER (SET/KITS/TRAYS/PACK) IMPLANT

## 2014-02-11 NOTE — ED Notes (Signed)
Pt to radiology.

## 2014-02-11 NOTE — Anesthesia Procedure Notes (Signed)
Procedure Name: Intubation Date/Time: 02/11/2014 7:31 PM Performed by: Claris Che Pre-anesthesia Checklist: Patient identified, Emergency Drugs available, Suction available and Patient being monitored Patient Re-evaluated:Patient Re-evaluated prior to inductionOxygen Delivery Method: Circle system utilized Preoxygenation: Pre-oxygenation with 100% oxygen Intubation Type: IV induction, Rapid sequence and Cricoid Pressure applied Ventilation: Mask ventilation without difficulty and Mask ventilation with difficulty Laryngoscope Size: Mac and 3 Grade View: Grade I Tube type: Oral Tube size: 7.5 mm Number of attempts: 1 Airway Equipment and Method: Stylet and LTA kit utilized Placement Confirmation: ETT inserted through vocal cords under direct vision,  positive ETCO2 and breath sounds checked- equal and bilateral Secured at: 22 cm Tube secured with: Tape Dental Injury: Teeth and Oropharynx as per pre-operative assessment

## 2014-02-11 NOTE — Progress Notes (Signed)
Orthopedic Tech Progress Note Patient Details:  Jenny Torres 07-23-1928 [id number]  Ortho Devices Ortho Device/Splint Location: trapeze bar patient helper Ortho Device/Splint Interventions: Application   Delecia Vastine 02/11/2014, 10:01 PM

## 2014-02-11 NOTE — Discharge Instructions (Signed)
Hip Fracture, Open Reduction and Internal Fixation (ORIF) A hip fracture, or broken hip, can happen to anyone. To fix it, surgery is usually needed. One method is called open reduction and internal fixation, or ORIF for short. "Open reduction" means an incision (cut) is made to open the fracture area. This lets the surgeon see the broken bone. The bone pieces will be put back together. Some type of hardware will be used to hold the bones in place. That is called "internal fixation." Screws, pins, rods or a metal plate might be used. More than 250,000 people in the [place] break a hip every year. Nearly all of them are treated successfully with surgery. LET YOUR CAREGIVER KNOW ABOUT : On the day of your surgery, your caregivers will need to know the last time you had anything to eat or drink. This includes water, gum and candy. Also make sure they know about:   Any allergies.  All medications you are taking, including:  Herbs, eyedrops, over-the-counter medications and creams.  Blood thinners (anticoagulants), aspirin or other drugs that could affect blood clotting.  Use of steroids (by mouth or as creams).  Previous problems with anesthesia, including local anesthetics.  Possibility of pregnancy, if this applies.  Any history of blood clots.  Any history of bleeding or other blood problems.  Previous surgery.  Family history of anesthetic complications  Smoking history.  Any recent symptoms of colds or infections.  Other health problems. RISKS AND COMPLICATIONS  All operations have some risk. Being unhealthy increases risks. That is why you want to be as healthy as possible before this surgery. Possible problems after ORIF may include:  Blood clots.  Bleeding.  Infection near the incision.  Lung infection (pneumonia).  Pain that continues after the operation.  Trouble walking. Some people may need to continue using a walker. BEFORE THE PROCEDURE You should be as  healthy as possible before surgery for a broken hip. Sometimes this means waiting until other health problems are addressed. Then, the operation can be scheduled. To find out if you are ready for surgery:  A medical evaluation will be done. This examination will include checking your heart and lungs.  Imaging tests. These let the surgeon see what the fracture looks like. They could include:  X-rays to find exactly where the break is.  Computed tomography (CT) scan. A CT scan takes pictures using X-rays and a computer. This can give a better view of the broken hip.  Magnetic resonance imaging (MRI scan). It uses a magnet, radio waves and a computer. It may show a hidden fracture that cannot be seen on X-ray or CT.  Blood tests.  Urine test. It is possible to have a urinary tract infection and not know it.  Talking with an anesthesiologist. This is the person who will be in charge of the anesthesia (medication to stop the pain) during the surgery. An ORIF procedure usually is done with general anesthesia (being asleep during surgery), or a spinal anesthesia is used to make you numb (no feeling) from the waist down but awake during the operation. Ask your surgeon if there is an advantage to one type of anesthetic over the other. You will need to stop taking certain medicines.  The admitting physician will have you stop using aspirin and non-steroidal anti-inflammatory drugs (NSAIDs) for pain relief. This includes prescription drugs and over-the-counter drugs such as ibuprofen and naproxen.  If you take blood-thinners, ask your healthcare provider when you should stop taking  them. You will have to give what is called informed consent. This requires signing a legal paper that gives permission for the surgery. To give informed consent:  You must understand how the procedure is done and why.  You must be told all the risks and benefits of the procedure.  You must sign the consent. Or, a legal  guardian can do this.  Signing should be witnessed by a healthcare professional. The day before the surgery, eat only a light dinner. Then, do not eat or drink anything for at least 8 hours before the surgery. Ask if it is OK to take any needed medicines with a sip of water. PROCEDURE The preparation:  Small monitors will be put on your body. They are used to check your heart, blood pressure and oxygen level.  You will be given an intravenous line (IV). A needle will be inserted in your arm. It is hooked to a plastic tube. Medication will be able to flow directly into your body through the IV.  You will be given anesthesia.  For general anesthesia, the anesthesiologist may hold a mask gently over your face. You will breathe in gases that will make you sleep. A tube also might be put in your throat. This would let you continue to get anesthesia during the procedure.  For spinal anesthesia, a drug will be injected (shot) into the spinal cord area. This will make the body numb from the waist down.  The hip area will be scrubbed with a special solution to kill any germs.  The procedure:  Once you are asleep or numb, the surgeon will move the bones (realign the fracture) before any incisions are made. The goal is to get the bones back to their normal position.  X-rays may be taken. This is to check the position of the bones.  An incision is made over the hip. It will go through the muscles to the broken bone.  The bones will be put in place. Some type of hardware will be used to hold the bone together.  The hip is a ball-and-socket joint. The "ball" part of the joint is the very top of the upper leg bone (femur). Sometimes the very top of the upper leg bone is replaced with a man-made piece. If it is replaced, this is called a partial hip replacement. Sometimes a complete or total hip replacement will be preformed, replacing both the ball and the socket. This is the preferred treatment if  there is any appearance of arthritis in the hip joint.  The incision is closed with small stitches or staples.  A dressing (medicine and a bandage) is put over the incision.  An ORIF procedure can take several hours. AFTER THE PROCEDURE  You will stay in a recovery area until the anesthesia has worn off. Your blood pressure and pulse will be checked every so often. Then you will be taken to a hospital room.  You may continue to get fluids through the IV for awhile.  Some pain is normal after an ORIF procedure. You will probably be given pain medicine. Be sure to tell your caregivers if the pain becomes severe.  It is important to be up and moving as soon as possible after an operation. Physical therapists will help you start walking. You will probably need to use a walker for a while. Follow the therapists instructions regarding weight bearing on the injured leg.  To prevent blood clots in your legs:  You may be given  special stockings to wear. °· You may need to take medicine to prevent clots. °· Most people stay in the hospital for several days after this surgery. °· Physical therapy is usually needed. Some people go to a rehabilitation center (a long-term care center or transitional care unit) before going home. Ask your healthcare providers what would be best for you. Often social workers are available to help you and your family make the best decision for you. °HOME CARE INSTRUCTIONS  °· Medication. °· Take any pain medicine that your surgeon suggests. Follow the directions carefully. Do not take over-the-counter painkillers unless the surgeon says it is OK. Medicine such as aspirin or ibuprofen can increase the chances of bleeding. °· Your healthcare provider may prescribe a blood-thinner for several weeks to 2 months. These drugs prevent blood clots. °· Wound care. °· Check the area around the incision carefully each day. Look for any redness or swelling. Also check for any fluid that is  seeping from the incision. Tell your healthcare provider if you see anything. °· Do not get the incision wet until your surgeon says it is OK. °· Activity. °· Most people will need the help of a walker or crutches for some time. °· You will need to continue physical therapy once you are home. This often lasts for several months. °· You will learn how to avoid putting stress on your hip while it heals if that is the direction given by your surgeon. °· Be sure to do any exercises the therapist suggests. These exercises will help make your hip stronger. °· Special equipment might make life at home easier. One example is a seat for the shower. Another is a raised toilet seat. °· Ask your healthcare provider when you can resume other activities, such as work, driving or sex. °· Follow-up care. °· The surgeon may need to take out stitches or staples. This is usually done about two weeks after the operation. °· The surgeon will do X-rays to check how your hip is healing. °SEEK MEDICAL CARE IF:  °· You have any questions about medications. °· You feel weak. °· You are too tired to walk every day. °· Pain continues, even after taking pain medicine. °· You develop a fever of more than 100.5° F (38.1° C). °SEEK IMMEDIATE MEDICAL CARE IF:  °· The incision becomes red or swollen. Or, it bleeds. °· Your leg or foot becomes painful and swollen. °· Your leg becomes pale or blue. It feels cold. It tingles or is numb. °· You have trouble breathing. °· You have chest pain. °· You develop a fever of more than 102° F (38.9° C). °Document Released: 08/26/2009 Document Revised: 11/30/2011 Document Reviewed: 08/26/2009 °[employer]® Patient Information ©2014 [employer]. ° °

## 2014-02-11 NOTE — ED Notes (Signed)
Report given to 5N RN. Pt changed to short stay. No answer when called to short stay.

## 2014-02-11 NOTE — ED Notes (Signed)
Foley catheter attempted x3 unsuccessful

## 2014-02-11 NOTE — Op Note (Signed)
Jenny Torres, Jenny Torres              ACCOUNT NO.:  [id number]  MEDICAL RECORD NO.:  [id number]  LOCATION:  [facility]                         FACILITY:  [facility]  PHYSICIAN:  [name], MD        DATE OF BIRTH:  Jul 05, 1928  DATE OF PROCEDURE:  02/11/2014 DATE OF DISCHARGE:                              OPERATIVE REPORT   PREOPERATIVE DIAGNOSIS:  Right hip intertrochanteric fracture.  POSTOPERATIVE DIAGNOSIS:  Right hip intertrochanteric fracture.  PROCEDURE:  Open treatment of right hip intertrochanteric fracture with intramedullary nailing.  SURGEON:  [name], MD.  ANESTHESIA:  General.  ESTIMATED BLOOD LOSS:  100 mL.  COMPLICATIONS:  None apparent.  DISPOSITION:  Extubated, awake, and stable to recovery.  INDICATIONS FOR PROCEDURE:  The patient is an 80 year old woman who fell earlier today landing on her right hip.  She was found in the emergency department to have an intertrochanteric fracture.  She presents now for operative treatment of this displaced unstable fracture.  She understands the risks and benefits, the alternative treatment options and elects surgical treatment.  She specifically understands risks of bleeding, infection, nerve damage, blood clots, need for additional surgery, amputation, and death.  PROCEDURE IN DETAIL:  After preoperative consent was obtained, the correct operative site was identified.  The patient was brought to the operating room and placed supine on the stretcher.  General anesthesia was induced.  Preoperative antibiotics were administered.  Surgical time- out was taken.  The patient was then moved onto the fracture table in a supine position.  Left lower extremity was placed on a padded foot holder with the leg extended.  The right lower extremity was positioned in a traction boot, and also padded well.  The reduction maneuver was then performed with gentle traction, abduction, and external rotation followed by adduction and internal  rotation.  AP and lateral radiographs were obtained, confirming appropriate reduction of the fracture.  The right lower extremity was then prepped and draped in standard sterile fashion.  A longitudinal incision was made proximal to the tip of the greater trochanter.  Blunt dissection was carried down through the subcutaneous tissue.  The gluteal fascia was incised.  The tip of the trochanter was identified.  A guide pin was inserted after AP and lateral images confirmed appropriate position.  The starter reamer was then used to open the femoral canal.  A short 130 degree Biomet Affixus nail was inserted and seated appropriately.  The lag screw insertion guide was then used to insert a guide pin into the femoral head.  AP and lateral radiographs confirmed appropriate position of the guide pin. The guide pin was then over drilled and a lag screw was inserted.  The fracture site was compressed appropriately.  The lag screw was secured to the proximal end of the screw in the sliding position.  The guide was then used to insert a distal interlock screw in percutaneous fashion. AP and lateral radiographs confirmed appropriate position and length of all hardware, appropriate reduction of the fracture.  The insertion handle was removed.  All 3 wounds were irrigated copiously.  Inverted simple sutures of 3-0 Monocryl were used to close the subcutaneous tissue and staples  were used to close the skin incisions.  Sterile dressings were applied.  The patient was awakened from anesthesia and transported to the recovery room in stable condition.  FOLLOWUP PLAN:  The patient will be weightbearing as tolerated on the right lower extremity.  She will start physical therapy and occupational therapy tomorrow and will have social work and case management consultations as well.  She will start DVT prophylaxis with Lovenox tomorrow.     [name], MD     JH/MEDQ  D:  02/11/2014  T:  02/11/2014   Job:  [id number]

## 2014-02-11 NOTE — Anesthesia Preprocedure Evaluation (Signed)
Anesthesia Evaluation  Patient identified by MRN, date of birth, ID band Patient awake    Reviewed: Allergy & Precautions, H&P , NPO status   Airway       Dental   Pulmonary COPDformer smoker,          Cardiovascular hypertension, + Peripheral Vascular Disease + Valvular Problems/Murmurs MVP     Neuro/Psych  Neuromuscular disease    GI/Hepatic GERD-  ,  Endo/Other    Renal/GU      Musculoskeletal   Abdominal   Peds  Hematology   Anesthesia Other Findings   Reproductive/Obstetrics                           Anesthesia Physical Anesthesia Plan  ASA: II  Anesthesia Plan: General   Post-op Pain Management:    Induction: Intravenous  Airway Management Planned: Oral ETT  Additional Equipment:   Intra-op Plan:   Post-operative Plan: Extubation in OR  Informed Consent: I have reviewed the patients History and Physical, chart, labs and discussed the procedure including the risks, benefits and alternatives for the proposed anesthesia with the patient or authorized representative who has indicated his/her understanding and acceptance.     Plan Discussed with:   Anesthesia Plan Comments:         Anesthesia Quick Evaluation

## 2014-02-11 NOTE — ED Notes (Signed)
Initial Contact - pt resting on stretcher, c/o pain to R hip/femur "but only when i move" s/p fall this AM.  Pt denies dizziness/weakness prior to fall or at this time.  +HTN noted, pt denies HA.  Neuros grossly intact.  +csm/+pulses to extremities.  +shortening/rotation to RLE.  No bruising/swelling noted.  Pt denies LOC but also c/o "bump to my head".  Skin PWD.  A+Ox4.

## 2014-02-11 NOTE — H&P (Signed)
PCP:   Darlin Coco, MD   Chief Complaint:  Fall  HPI: 80 year old female who   has a past medical history of Anxiety; Depression; COPD (chronic obstructive pulmonary disease); HTN (hypertension); Gastritis; Polyp of colon; Hemorrhoids, external; Diverticula, colon; GERD (gastroesophageal reflux disease); Carotid artery occlusion; Idiopathic peripheral neuropathy; Hyperlipidemia; MVP (mitral valve prolapse); Palpitations; Osteoporosis; and Hyperlipidemia. Today presents to the ED after patient sustained a fall at home, as per patient when she got up from the chair to go to the bathroom, she lost her balance and fell to the ground, hitting her head and hip, patient says that she could not get up due to pain in the hip. She dragged herself to the phone and call 911. She denies any passing out, patient does have a history of peripheral neuropathy, and has been taking gabapentin. She denies chest pain, no shortness of breath. She denies fever no dysuria. In the ED CT scan of the head and cervical spine were done, which showed chronic changes with no acute abnormality. X-ray of right hip showed mildly comminuted intertrochanteric right femur fracture with mild valgus angulation.  Patient had one episode of vomiting in the ED after she received fentanyl for pain. At this time she denies pain if she is lying still, and pain gets worse on movement. ED physician called Dr. Doran Durand, who recommended to [facility] this evening. Patient denies any history of CAD, denies dyspnea on exertion, usually patient's mobility is limited due to peripheral neuropathy. Patient lives by herself and able to perform all the ADLs, drives car. Patient's husband has Alzheimer disease and is currently in a skilled facility.      Allergies  Allergen Reactions  . Sulfamethoxazole Swelling    Swelling of the throat, mouth, tongue, nose  . Ceftin     GI problems  . Darvocet  [Propoxyphene N-Acetaminophen] Itching  . Erythromycin     Upset stomach  . Hydrocod Polst-Cpm Polst Er     nausea  . Lovastatin     unknown  . Paroxetine Hcl     jittery  . Penicillins     unknown  . Theophyllines   . Zovirax [Acyclovir]     Per patient lots of side effects      Past Medical History   Diagnosis Date  . Anxiety   . Depression   . COPD (chronic obstructive pulmonary disease)   . HTN (hypertension)   . Gastritis   . Polyp of colon   . Hemorrhoids, external   . Diverticula, colon   . GERD (gastroesophageal reflux disease)   . Carotid artery occlusion   . Idiopathic peripheral neuropathy     chronic  . Hyperlipidemia   . MVP (mitral valve prolapse)     stable  . Palpitations   . Osteoporosis   . Hyperlipidemia     Past Surgical History  Procedure Laterality Date  . Cholecystectomy    . C-sections      x2    Prior to Admission medications   Medication Sig Start Date End Date Taking? Authorizing Provider  albuterol (PROVENTIL HFA;VENTOLIN HFA) 108 (90 BASE) MCG/ACT inhaler Inhale 1-2 puffs into the lungs every 6 (six) hours as needed for wheezing or shortness of breath.   Yes Historical Provider, MD  aspirin 81 MG tablet Take 81 mg by mouth daily.     Yes Historical Provider, MD  Besifloxacin HCl (BESIVANCE) 0.6 % SUSP Apply 1 drop to eye  2 (two) times daily. Both eyes   Yes Historical Provider, MD  Cholecalciferol (VITAMIN D3) 1000 UNITS CAPS Take 1 capsule by mouth.   Yes Historical Provider, MD  fluocinonide (LIDEX) 0.05 % cream Apply topically 2 (two) times daily. As directed by dermatologist    Yes Historical Provider, MD  gabapentin (NEURONTIN) 800 MG tablet Take 800 mg by mouth 3 (three) times daily.   Yes Historical Provider, MD  LORazepam (ATIVAN) 1 MG tablet Take 0.5-1 mg by mouth daily as needed for anxiety.   Yes Historical Provider, MD  metoprolol tartrate (LOPRESSOR) 25 MG tablet Take 12.5 mg by mouth 2 (two) times daily.   Yes  Historical Provider, MD  Multiple Vitamins-Minerals (MULTIVITAL) tablet Take 1 tablet by mouth daily.     Yes Historical Provider, MD  ondansetron (ZOFRAN-ODT) 8 MG disintegrating tablet TAKE ONE TABLET AS NEEDED 06/28/13  Yes Darlin Coco, MD  OVER THE COUNTER MEDICATION Place 1 drop into the nose as needed (for congestion). Over the counter nasal spray   Yes Historical Provider, MD  zolpidem (AMBIEN CR) 12.5 MG CR tablet Take 12.5 mg by mouth at bedtime as needed for sleep.   Yes Historical Provider, MD    Social History:  reports that she quit smoking about 31 years ago. Her smoking use included Cigarettes. She has a 25 pack-year smoking history. She has never used smokeless tobacco. She reports that she drinks alcohol. She reports that she does not use illicit drugs.  Family History  Problem Relation Age of Onset  . Breast cancer Mother   . Liver disease Brother   . Cirrhosis Brother      All the positives are listed in BOLD  Review of Systems:  HEENT: Headache, blurred vision, runny nose, sore throat, recent cataract surgery Neck: Hypothyroidism, hyperthyroidism,,lymphadenopathy Chest : Shortness of breath, history of COPD, Asthma Heart : Chest pain, history of coronary arterey disease GI:  Nausea, vomiting, diarrhea, constipation, GERD GU: Dysuria, urgency, frequency of urination, hematuria Neuro: Stroke, seizures, syncope Psych: Depression, anxiety, hallucinations   Physical Exam: Blood pressure 191/93, pulse 84, temperature 97.4 F (36.3 C), temperature source Oral, resp. rate 17, SpO2 96.00%. Constitutional:   Patient is a well-developed and well-nourished female* in no acute distress and cooperative with exam. Head: Normocephalic and atraumatic Mouth: Mucus membranes moist Eyes: PERRL, EOMI, conjunctivae normal Neck: Supple, No Thyromegaly, no limitation of range of motion Cardiovascular: RRR, S1 normal, S2 normal Pulmonary/Chest: CTAB, no wheezes, rales, or  rhonchi Abdominal: Soft. Non-tender, non-distended, bowel sounds are normal, no masses, organomegaly, or guarding present.  Neurological: A&O x3, Strenght is normal and symmetric bilaterally, cranial nerve II-XII are grossly intact, no focal motor deficit, sensory intact to light touch bilaterally.  Extremities : No Cyanosis, Clubbing or Edema  Labs on Admission:  Basic Metabolic Panel:  Recent Labs Lab 02/11/14 1325  NA 128*  K 3.7  CL 88*  CO2 25  GLUCOSE 107*  BUN 10  CREATININE 0.49*  CALCIUM 9.6   Liver Function Tests: No results found for this basename: AST, ALT, ALKPHOS, BILITOT, PROT, ALBUMIN,  in the last 168 hours No results found for this basename: LIPASE, AMYLASE,  in the last 168 hours No results found for this basename: AMMONIA,  in the last 168 hours CBC:  Recent Labs Lab 02/11/14 1325  WBC 7.0  NEUTROABS 5.0  HGB 14.1  HCT 39.2  MCV 85.8  PLT 208   Cardiac Enzymes: No results found for this basename: CKTOTAL,  CKMB, CKMBINDEX, TROPONINI,  in the last 168 hours  BNP (last 3 results) No results found for this basename: PROBNP,  in the last 8760 hours CBG: No results found for this basename: GLUCAP,  in the last 168 hours  Radiological Exams on Admission: Dg Pelvis 1-2 Views  02/11/2014   CLINICAL DATA:  Right hip pain after fall.  EXAM: PELVIS - 1-2 VIEW  COMPARISON:  None.  FINDINGS: Severe degenerative joint disease is seen involving both hip joints. Diffuse osteopenia is noted. Moderately displaced fracture is seen involving the trochanteric region of proximal right femur.  IMPRESSION: Moderately displaced intertrochanteric fracture of the proximal right femur. Severe degenerative joint disease is seen involving both hip joints.   Electronically Signed   By: Sabino Dick M.D.   On: 02/11/2014 14:37   Dg Femur Right  02/11/2014   CLINICAL DATA:  Right hip injury and pain.  EXAM: RIGHT FEMUR - 2 VIEW  COMPARISON:  None.  FINDINGS: A mildly comminuted  intratrochanteric fracture of the proximal right femur is noted with mild valgus angulation.  Diffuse osteopenia is present.  Severe degenerative changes in the right hip are noted.  IMPRESSION: Mildly comminuted intertrochanteric right femur fracture with mild valgus angulation.   Electronically Signed   By: Hassan Rowan M.D.   On: 02/11/2014 14:36   Ct Head Wo Contrast  02/11/2014   CLINICAL DATA:  Status post fall.  Pain.  EXAM: CT HEAD WITHOUT CONTRAST  CT CERVICAL SPINE WITHOUT CONTRAST  TECHNIQUE: Multidetector CT imaging of the head and cervical spine was performed following the standard protocol without intravenous contrast. Multiplanar CT image reconstructions of the cervical spine were also generated.  COMPARISON:  03/28/2006  FINDINGS: CT HEAD FINDINGS  Ventricles are normal in configuration. There is ventricular and sulcal enlargement reflecting mild atrophy. No parenchymal masses or mass effect. There is no evidence of a recent infarct. Patchy white matter hypoattenuation is noted most consistent with moderate chronic microvascular ischemic change.  No extra-axial masses or abnormal fluid collections.  There is no intracranial hemorrhage.  The visualized sinuses and mastoid air cells are clear. No skull fracture.  CT CERVICAL SPINE FINDINGS  There are well-defined lucent lines crossing the pedicles of C4, with a subtle oblique lucent line crossing the right lamina at C4. Although these could potentially be acute fractures a have a chronic appearance. There is no associated soft tissue edema or hemorrhage.  No other evidence of a fracture.  No spondylolisthesis. There is reversal of the normal cervical lordosis centered at C5. There is moderate loss of disk height at C5-C6 and C6-C7 with mild loss of disk height at C4-C5. There are prominent endplate osteophytes along the mid and lower cervical spine. Facet degenerative changes also noted bilaterally, greatest on the right at C2-C3 and on the left at  C2-C3 and C3-C4. The bones are demineralized.  The soft tissues are unremarkable.  The lung apices show scarring, but no acute findings.  IMPRESSION: HEAD CT:  No acute intracranial abnormality.  No skull fracture.  CERVICAL CT: Defects are noted across the pedicles at C4 as well as the right C4 lamina. Although these could potentially reflect acute fractures, and a chronic appearance and potentially could be developmental. If the patient is a reliable historian, and does not have significant neck pain, the should be considered chronic or developmental. If there is neck pain or neck pain cannot be ascertained, consider followup cervical MRI.  No other evidence of a fracture.  Advanced degenerative changes.   Electronically Signed   By: Lajean Manes M.D.   On: 02/11/2014 14:23   Ct Cervical Spine Wo Contrast  02/11/2014   CLINICAL DATA:  Status post fall.  Pain.  EXAM: CT HEAD WITHOUT CONTRAST  CT CERVICAL SPINE WITHOUT CONTRAST  TECHNIQUE: Multidetector CT imaging of the head and cervical spine was performed following the standard protocol without intravenous contrast. Multiplanar CT image reconstructions of the cervical spine were also generated.  COMPARISON:  03/28/2006  FINDINGS: CT HEAD FINDINGS  Ventricles are normal in configuration. There is ventricular and sulcal enlargement reflecting mild atrophy. No parenchymal masses or mass effect. There is no evidence of a recent infarct. Patchy white matter hypoattenuation is noted most consistent with moderate chronic microvascular ischemic change.  No extra-axial masses or abnormal fluid collections.  There is no intracranial hemorrhage.  The visualized sinuses and mastoid air cells are clear. No skull fracture.  CT CERVICAL SPINE FINDINGS  There are well-defined lucent lines crossing the pedicles of C4, with a subtle oblique lucent line crossing the right lamina at C4. Although these could potentially be acute fractures a have a chronic appearance. There is no  associated soft tissue edema or hemorrhage.  No other evidence of a fracture.  No spondylolisthesis. There is reversal of the normal cervical lordosis centered at C5. There is moderate loss of disk height at C5-C6 and C6-C7 with mild loss of disk height at C4-C5. There are prominent endplate osteophytes along the mid and lower cervical spine. Facet degenerative changes also noted bilaterally, greatest on the right at C2-C3 and on the left at C2-C3 and C3-C4. The bones are demineralized.  The soft tissues are unremarkable.  The lung apices show scarring, but no acute findings.  IMPRESSION: HEAD CT:  No acute intracranial abnormality.  No skull fracture.  CERVICAL CT: Defects are noted across the pedicles at C4 as well as the right C4 lamina. Although these could potentially reflect acute fractures, and a chronic appearance and potentially could be developmental. If the patient is a reliable historian, and does not have significant neck pain, the should be considered chronic or developmental. If there is neck pain or neck pain cannot be ascertained, consider followup cervical MRI.  No other evidence of a fracture.  Advanced degenerative changes.   Electronically Signed   By: Lajean Manes M.D.   On: 02/11/2014 14:23    EKG: Independently reviewed. EKG shows PACs, normal sinus rhythm   Assessment/Plan Principal Problem:   Femur fracture, right Active Problems:   HYPERTENSION   COPD   Raynaud's disease /phenomenon   Idiopathic peripheral neuropathy   Mild hyponatremia  Right femur fracture Patient has right femur fracture, orthopedics Dr. Doran Durand  has been consulted by the ED physician. Plan is for surgery tonight. [facility]'ll [facility]. Continue fentanyl when necessary for pain.  Hypertension Patient did not take her metoprolol this morning, usually she takes 25 mg twice a day. As patient is vomiting, will give her one dose of metoprolol 5 mg IV in the ED, and also start 5 mg IV every 6  hours when necessary for BP greater than 160/100. When patient is able to take by mouth, her home regimen can be restarted.  Peripheral neuropathy Patient has peripheral neuropathy, likely due to the Raynauds disease. Continue Neurontin  Nausea and vomiting Started in the ED after patient received fentanyl We'll give Zofran when necessary for nausea and vomiting  Hyponatremia ? Cause, likely  due to dehydration. Will check serum osmolality. Patient will be started on IV normal saline  COPD Patient quit smoking 30 years ago, only uses rarely albuterol for shortness of breath She is currently not in COPD exacerbation Stable  Raynaud's disease Stable Patient has history of Raynauds disease. No acute exacerbation at this time  Severe protein calorie malnutrition Patient says that she has been skinny throughout her life, she hasn't lost weight recently [facility] consult nutrition after surgery  Risk stratification- patient is a moderate risk for surgery considering her age and severe malnutrition.  DVT prophylaxis Lovenox  Code status: Patient is DO NOT RESUSCITATE  Family discussion: Admission, patients condition and plan of care including tests being ordered have been discussed with the patient and her daughter at the bedside who indicate understanding and agree with the plan and Code Status.   Time Spent on Admission: 65 minutes  [facility] Pager: [phone] 02/11/2014, 4:10 PM  If 7PM-7AM, please contact night-coverage  [email or web address]  Password [email or web address]

## 2014-02-11 NOTE — Anesthesia Postprocedure Evaluation (Signed)
  Anesthesia Post-op Note  Patient: Jenny Torres  Procedure(s) Performed: Procedure(s): INTRAMEDULLARY (IM) NAIL INTERTROCHANTRIC (Right)  Patient Location: PACU  Anesthesia Type:General  Level of Consciousness: awake, alert , oriented and patient cooperative  Airway and Oxygen Therapy: Patient Spontanous Breathing  Post-op Pain: mild  Post-op Assessment: Post-op Vital signs reviewed, Patient's Cardiovascular Status Stable, Respiratory Function Stable, Patent Airway, No signs of Nausea or vomiting and Pain level controlled  Post-op Vital Signs: stable  Last Vitals:  Filed Vitals:   02/11/14 1700  BP: 155/90  Pulse:   Temp:   Resp: 22    Complications: No apparent anesthesia complications

## 2014-02-11 NOTE — ED Notes (Signed)
Patient transported to CT 

## 2014-02-11 NOTE — ED Provider Notes (Signed)
CSN: [id number]     Arrival date & time 02/11/14  1240 History   First MD Initiated Contact with Patient 02/11/14 1253     Chief Complaint  Patient presents with  . Fall     (Consider location/radiation/quality/duration/timing/severity/associated sxs/prior Treatment) Patient is a 80 y.o. female presenting with fall.  Fall This is a new problem. The current episode started less than 1 hour ago. Episode frequency: once. The problem has been resolved. Associated symptoms include headaches. Pertinent negatives include no chest pain, no abdominal pain and no shortness of breath. Associated symptoms comments: Right hip pain. Exacerbated by: hip pain worsened with movement. The symptoms are relieved by rest. Treatments tried: fentanyl given by EMS PTA. The treatment provided moderate relief.    Past Medical History  Diagnosis Date  . Anxiety   . Depression   . COPD (chronic obstructive pulmonary disease)   . HTN (hypertension)   . Gastritis   . Polyp of colon   . Hemorrhoids, external   . Diverticula, colon   . GERD (gastroesophageal reflux disease)   . Carotid artery occlusion   . Idiopathic peripheral neuropathy     chronic  . Hyperlipidemia   . MVP (mitral valve prolapse)     stable  . Palpitations   . Osteoporosis   . Hyperlipidemia    Past Surgical History  Procedure Laterality Date  . Cholecystectomy    . C-sections      x2   Family History  Problem Relation Age of Onset  . Breast cancer Mother   . Liver disease Brother   . Cirrhosis Brother    History  Substance Use Topics  . Smoking status: Former Smoker -- 1.00 packs/day for 25 years    Types: Cigarettes    Quit date: 09/21/1982  . Smokeless tobacco: Never Used  . Alcohol Use: Yes     Comment: rarely   OB History   Grav Para Term Preterm Abortions TAB SAB Ect Mult Living                 Review of Systems  Respiratory: Negative for shortness of breath.   Cardiovascular: Negative for chest pain.   Gastrointestinal: Negative for abdominal pain.  Neurological: Positive for headaches.  All other systems reviewed and are negative.     Allergies  Sulfamethoxazole; Ceftin; Darvocet; Erythromycin; Hydrocod polst-cpm polst er; Lovastatin; Paroxetine hcl; Penicillins; Theophyllines; and Zovirax  Home Medications   Prior to Admission medications   Medication Sig Start Date End Date Taking? Authorizing Provider  albuterol (PROVENTIL HFA;VENTOLIN HFA) 108 (90 BASE) MCG/ACT inhaler Inhale 2 puffs into the lungs every 6 (six) hours as needed for wheezing. 01/21/13   Robyn Haber, MD  aspirin 81 MG tablet Take 81 mg by mouth daily.      Historical Provider, MD  Cholecalciferol (VITAMIN D3) 1000 UNITS CAPS Take 1 capsule by mouth.    Historical Provider, MD  cyanocobalamin (,VITAMIN B-12,) 1000 MCG/ML injection 1 cc weekly x 4 weeks and then monthly 12/29/12   Darlin Coco, MD  fluocinonide (LIDEX) 0.05 % cream Apply topically 2 (two) times daily. As directed by dermatologist     Historical Provider, MD  fluticasone (FLONASE) 50 MCG/ACT nasal spray Place 1 spray into the nose daily as needed. 03/04/12   Darlin Coco, MD  fluticasone [facility]) 50 MCG/ACT nasal spray ONE SPRAY ONCE A DAY INTO NOSE 04/29/13   Darlin Coco, MD  gabapentin (NEURONTIN) 800 MG tablet TAKE 1 TABLET THREE TIMES A  DAY AS NEEDED 03/28/13   Darlin Coco, MD  LORazepam (ATIVAN) 1 MG tablet TAKE 1/2 TO 1 TABLET DAILY AS NEEDED 06/30/13   Darlin Coco, MD  metoprolol tartrate (LOPRESSOR) 25 MG tablet TAKE ONE-HALF (1/2) TABLET (12.5 MG TOTAL) TWICE A DAY    Darlin Coco, MD  Multiple Vitamins-Minerals (MULTIVITAL) tablet Take 1 tablet by mouth daily.      Historical Provider, MD  ondansetron (ZOFRAN-ODT) 8 MG disintegrating tablet TAKE ONE TABLET AS NEEDED 06/28/13   Darlin Coco, MD  Psyllium (METAMUCIL) 30.9 % POWD Take by mouth daily.      Historical Provider, MD  sertraline (ZOLOFT) 25 MG tablet TAKE  ONE TABLET BY MOUTH ONCE DAILY    Darlin Coco, MD  zolpidem (AMBIEN CR) 12.5 MG CR tablet TAKE ONE TABLET AT BEDTIME AS NEEDED 11/24/13   Darlin Coco, MD   BP 222/93  Temp(Src) 97.4 F (36.3 C) (Oral)  Resp 20  SpO2 98% Physical Exam  Nursing note and vitals reviewed. Constitutional: She is oriented to person, place, and time. She appears well-developed and well-nourished. No distress.  HENT:  Head: Normocephalic. Head is with contusion (small, right posterior). Head is without raccoon's eyes and without Battle's sign.  Nose: Nose normal.  Eyes: Conjunctivae and EOM are normal. No scleral icterus. Right pupil is reactive. Left pupil is reactive. Pupils are unequal (slight anisocoria).  Neck: No spinous process tenderness and no muscular tenderness present.  Cardiovascular: Normal rate, regular rhythm, normal heart sounds and intact distal pulses.   No murmur heard. Pulmonary/Chest: Effort normal and breath sounds normal. She has no rales. She exhibits no tenderness.  Abdominal: Soft. There is no tenderness. There is no rebound and no guarding.  Musculoskeletal: She exhibits no edema.       Right hip: She exhibits tenderness, bony tenderness and deformity (2+ DP pulses distal).       Thoracic back: She exhibits no tenderness and no bony tenderness.       Lumbar back: She exhibits no tenderness and no bony tenderness.  No evidence of trauma to extremities, except as noted.  2+ distal pulses.    Neurological: She is alert and oriented to person, place, and time.  Skin: Skin is warm and dry. No rash noted.  Psychiatric: She has a normal mood and affect.    ED Course  Procedures (including critical care time) Labs Review Labs Reviewed  BASIC METABOLIC PANEL - Abnormal; Notable for the following:    Sodium 128 (*)    Chloride 88 (*)    Glucose, Bld 107 (*)    Creatinine, Ser 0.49 (*)    GFR calc non Af Amer 88 (*)    All other components within normal limits  CBC WITH  DIFFERENTIAL  PROTIME-INR  TYPE AND SCREEN  ABO/RH    Imaging Review Dg Pelvis 1-2 Views  02/11/2014   CLINICAL DATA:  Right hip pain after fall.  EXAM: PELVIS - 1-2 VIEW  COMPARISON:  None.  FINDINGS: Severe degenerative joint disease is seen involving both hip joints. Diffuse osteopenia is noted. Moderately displaced fracture is seen involving the trochanteric region of proximal right femur.  IMPRESSION: Moderately displaced intertrochanteric fracture of the proximal right femur. Severe degenerative joint disease is seen involving both hip joints.   Electronically Signed   By: Sabino Dick M.D.   On: 02/11/2014 14:37   Dg Femur Right  02/11/2014   CLINICAL DATA:  Right hip injury and pain.  EXAM: RIGHT FEMUR -  2 VIEW  COMPARISON:  None.  FINDINGS: A mildly comminuted intratrochanteric fracture of the proximal right femur is noted with mild valgus angulation.  Diffuse osteopenia is present.  Severe degenerative changes in the right hip are noted.  IMPRESSION: Mildly comminuted intertrochanteric right femur fracture with mild valgus angulation.   Electronically Signed   By: Hassan Rowan M.D.   On: 02/11/2014 14:36   Ct Head Wo Contrast  02/11/2014   CLINICAL DATA:  Status post fall.  Pain.  EXAM: CT HEAD WITHOUT CONTRAST  CT CERVICAL SPINE WITHOUT CONTRAST  TECHNIQUE: Multidetector CT imaging of the head and cervical spine was performed following the standard protocol without intravenous contrast. Multiplanar CT image reconstructions of the cervical spine were also generated.  COMPARISON:  03/28/2006  FINDINGS: CT HEAD FINDINGS  Ventricles are normal in configuration. There is ventricular and sulcal enlargement reflecting mild atrophy. No parenchymal masses or mass effect. There is no evidence of a recent infarct. Patchy white matter hypoattenuation is noted most consistent with moderate chronic microvascular ischemic change.  No extra-axial masses or abnormal fluid collections.  There is no intracranial  hemorrhage.  The visualized sinuses and mastoid air cells are clear. No skull fracture.  CT CERVICAL SPINE FINDINGS  There are well-defined lucent lines crossing the pedicles of C4, with a subtle oblique lucent line crossing the right lamina at C4. Although these could potentially be acute fractures a have a chronic appearance. There is no associated soft tissue edema or hemorrhage.  No other evidence of a fracture.  No spondylolisthesis. There is reversal of the normal cervical lordosis centered at C5. There is moderate loss of disk height at C5-C6 and C6-C7 with mild loss of disk height at C4-C5. There are prominent endplate osteophytes along the mid and lower cervical spine. Facet degenerative changes also noted bilaterally, greatest on the right at C2-C3 and on the left at C2-C3 and C3-C4. The bones are demineralized.  The soft tissues are unremarkable.  The lung apices show scarring, but no acute findings.  IMPRESSION: HEAD CT:  No acute intracranial abnormality.  No skull fracture.  CERVICAL CT: Defects are noted across the pedicles at C4 as well as the right C4 lamina. Although these could potentially reflect acute fractures, and a chronic appearance and potentially could be developmental. If the patient is a reliable historian, and does not have significant neck pain, the should be considered chronic or developmental. If there is neck pain or neck pain cannot be ascertained, consider followup cervical MRI.  No other evidence of a fracture.  Advanced degenerative changes.   Electronically Signed   By: Lajean Manes M.D.   On: 02/11/2014 14:23   Ct Cervical Spine Wo Contrast  02/11/2014   CLINICAL DATA:  Status post fall.  Pain.  EXAM: CT HEAD WITHOUT CONTRAST  CT CERVICAL SPINE WITHOUT CONTRAST  TECHNIQUE: Multidetector CT imaging of the head and cervical spine was performed following the standard protocol without intravenous contrast. Multiplanar CT image reconstructions of the cervical spine were also  generated.  COMPARISON:  03/28/2006  FINDINGS: CT HEAD FINDINGS  Ventricles are normal in configuration. There is ventricular and sulcal enlargement reflecting mild atrophy. No parenchymal masses or mass effect. There is no evidence of a recent infarct. Patchy white matter hypoattenuation is noted most consistent with moderate chronic microvascular ischemic change.  No extra-axial masses or abnormal fluid collections.  There is no intracranial hemorrhage.  The visualized sinuses and mastoid air cells are clear. No skull  fracture.  CT CERVICAL SPINE FINDINGS  There are well-defined lucent lines crossing the pedicles of C4, with a subtle oblique lucent line crossing the right lamina at C4. Although these could potentially be acute fractures a have a chronic appearance. There is no associated soft tissue edema or hemorrhage.  No other evidence of a fracture.  No spondylolisthesis. There is reversal of the normal cervical lordosis centered at C5. There is moderate loss of disk height at C5-C6 and C6-C7 with mild loss of disk height at C4-C5. There are prominent endplate osteophytes along the mid and lower cervical spine. Facet degenerative changes also noted bilaterally, greatest on the right at C2-C3 and on the left at C2-C3 and C3-C4. The bones are demineralized.  The soft tissues are unremarkable.  The lung apices show scarring, but no acute findings.  IMPRESSION: HEAD CT:  No acute intracranial abnormality.  No skull fracture.  CERVICAL CT: Defects are noted across the pedicles at C4 as well as the right C4 lamina. Although these could potentially reflect acute fractures, and a chronic appearance and potentially could be developmental. If the patient is a reliable historian, and does not have significant neck pain, the should be considered chronic or developmental. If there is neck pain or neck pain cannot be ascertained, consider followup cervical MRI.  No other evidence of a fracture.  Advanced degenerative  changes.   Electronically Signed   By: Lajean Manes M.D.   On: 02/11/2014 14:23  All radiology studies independently viewed by me.      EKG Interpretation   Date/Time:  Sunday Feb 11 2014 13:14:52 EDT Ventricular Rate:  78 PR Interval:  142 QRS Duration: 102 QT Interval:  404 QTC Calculation: 460 R Axis:   53 Text Interpretation:  Sinus rhythm Atrial premature complex RSR' in V1 or  V2, right VCD or RVH No significant change was found Confirmed by [facility]   MD, TREY ([id number]) on 02/11/2014 1:37:29 PM      MDM   Final diagnoses:  Fracture, intertrochanteric, right femur  Fall    80 year old female presenting after a mechanical fall. She landed on her right hip and hit the right side of her head.  She denies any other injuries. Right hip with mild deformity, suspect fracture. Plan CT head and neck, plain films of right hip. IV fentanyl for pain control. She is hypertensive, likely secondary to pain and anxiety.  Right intertroch fracture.  Pt has relationship with [employer].  Discussed case with Dr. Doran Durand who plans to take to OR today, requests [facility] Pontes.  Discussed with Dr. Darrick Meigs [facility]) who will facilitate admission.    Houston Siren III, MD 02/11/14 [phone]

## 2014-02-11 NOTE — ED Notes (Signed)
Pt on bedpan is refusing to be taken off at this moment

## 2014-02-11 NOTE — ED Notes (Signed)
Bed: WA01 Expected date:  Expected time:  Means of arrival:  Comments: EMS-hip pain 

## 2014-02-11 NOTE — ED Notes (Signed)
Patient arrives via EMS due to c/o falling after tripping/falling c/o right hip pain Fentanyl 21mcg given by EMS en route to ED Patient also with c/o head pain No neck or back pain NO LOC

## 2014-02-11 NOTE — Consult Note (Signed)
Reason for Consult:  Right hip pain Referring Physician:  Dr. Marella Bile Jenny Torres is an 80 y.o. female.  HPI:  80 y/o female without relevant PMH fell earlier today on her right hip.  She c/o aching pain that is moderate when holding still but worse when moving.  No change in baseline numbnes of LEs from peripheral neuropathy.  She denies any h/o diabetes or recent smoking.  She lives alone.  No LOC with the fall.  No blood thinners.  Last ate this morning at breakfast.  Past Medical History  Diagnosis Date  . Anxiety   . Depression   . COPD (chronic obstructive pulmonary disease)   . HTN (hypertension)   . Gastritis   . Polyp of colon   . Hemorrhoids, external   . Diverticula, colon   . GERD (gastroesophageal reflux disease)   . Carotid artery occlusion   . Idiopathic peripheral neuropathy     chronic  . Hyperlipidemia   . MVP (mitral valve prolapse)     stable  . Palpitations   . Osteoporosis   . Hyperlipidemia     Past Surgical History  Procedure Laterality Date  . Cholecystectomy    . C-sections      x2    Family History  Problem Relation Age of Onset  . Breast cancer Mother   . Liver disease Brother   . Cirrhosis Brother     Social History:  reports that she quit smoking about 31 years ago. Her smoking use included Cigarettes. She has a 25 pack-year smoking history. She has never used smokeless tobacco. She reports that she drinks alcohol. She reports that she does not use illicit drugs.  Allergies:  Allergies  Allergen Reactions  . Sulfamethoxazole Swelling    Swelling of the throat, mouth, tongue, nose  . Ceftin     GI problems  . Darvocet [Propoxyphene N-Acetaminophen] Itching  . Erythromycin     Upset stomach  . Hydrocod Polst-Cpm Polst Er     nausea  . Lovastatin     unknown  . Paroxetine Hcl     jittery  . Penicillins     unknown  . Theophyllines   . Zovirax [Acyclovir]     Per patient lots of side effects    Medications: I have  reviewed the patient's current medications.  Results for orders placed during the hospital encounter of 02/11/14 (from the past 48 hour(s))  BASIC METABOLIC PANEL     Status: Abnormal   Collection Time    02/11/14  1:25 PM      Result Value Ref Range   Sodium 128 (*) 137 - 147 mEq/L   Potassium 3.7  3.7 - 5.3 mEq/L   Chloride 88 (*) 96 - 112 mEq/L   CO2 25  19 - 32 mEq/L   Glucose, Bld 107 (*) 70 - 99 mg/dL   BUN 10  6 - 23 mg/dL   Creatinine, Ser 0.49 (*) 0.50 - 1.10 mg/dL   Calcium 9.6  8.4 - 10.5 mg/dL   GFR calc non Af Amer 88 (*) >90 mL/min   GFR calc Af Amer >90  >90 mL/min   Comment: (NOTE)     The eGFR has been calculated using the CKD EPI equation.     This calculation has not been validated in all clinical situations.     eGFR's persistently <90 mL/min signify possible Chronic Kidney     Disease.  CBC WITH DIFFERENTIAL  Status: None   Collection Time    02/11/14  1:25 PM      Result Value Ref Range   WBC 7.0  4.0 - 10.5 K/uL   RBC 4.57  3.87 - 5.11 MIL/uL   Hemoglobin 14.1  12.0 - 15.0 g/dL   HCT 39.2  36.0 - 46.0 %   MCV 85.8  78.0 - 100.0 fL   MCH 30.9  26.0 - 34.0 pg   MCHC 36.0  30.0 - 36.0 g/dL   RDW 12.4  11.5 - 15.5 %   Platelets 208  150 - 400 K/uL   Neutrophils Relative % 72  43 - 77 %   Neutro Abs 5.0  1.7 - 7.7 K/uL   Lymphocytes Relative 16  12 - 46 %   Lymphs Abs 1.1  0.7 - 4.0 K/uL   Monocytes Relative 11  3 - 12 %   Monocytes Absolute 0.7  0.1 - 1.0 K/uL   Eosinophils Relative 1  0 - 5 %   Eosinophils Absolute 0.1  0.0 - 0.7 K/uL   Basophils Relative 0  0 - 1 %   Basophils Absolute 0.0  0.0 - 0.1 K/uL  PROTIME-INR     Status: None   Collection Time    02/11/14  1:25 PM      Result Value Ref Range   Prothrombin Time 12.9  11.6 - 15.2 seconds   INR 0.99  0.00 - 1.49  TYPE AND SCREEN     Status: None   Collection Time    02/11/14  1:25 PM      Result Value Ref Range   ABO/RH(D) O POS     Antibody Screen NEG     Sample Expiration  02/14/2014    ABO/RH     Status: None   Collection Time    02/11/14  1:25 PM      Result Value Ref Range   ABO/RH(D) O POS      Dg Pelvis 1-2 Views  02/11/2014   CLINICAL DATA:  Right hip pain after fall.  EXAM: PELVIS - 1-2 VIEW  COMPARISON:  None.  FINDINGS: Severe degenerative joint disease is seen involving both hip joints. Diffuse osteopenia is noted. Moderately displaced fracture is seen involving the trochanteric region of proximal right femur.  IMPRESSION: Moderately displaced intertrochanteric fracture of the proximal right femur. Severe degenerative joint disease is seen involving both hip joints.   Electronically Signed   By: Sabino Dick M.D.   On: 02/11/2014 14:37   Dg Femur Right  02/11/2014   CLINICAL DATA:  Right hip injury and pain.  EXAM: RIGHT FEMUR - 2 VIEW  COMPARISON:  None.  FINDINGS: A mildly comminuted intratrochanteric fracture of the proximal right femur is noted with mild valgus angulation.  Diffuse osteopenia is present.  Severe degenerative changes in the right hip are noted.  IMPRESSION: Mildly comminuted intertrochanteric right femur fracture with mild valgus angulation.   Electronically Signed   By: Hassan Rowan M.D.   On: 02/11/2014 14:36   Ct Head Wo Contrast  02/11/2014   CLINICAL DATA:  Status post fall.  Pain.  EXAM: CT HEAD WITHOUT CONTRAST  CT CERVICAL SPINE WITHOUT CONTRAST  TECHNIQUE: Multidetector CT imaging of the head and cervical spine was performed following the standard protocol without intravenous contrast. Multiplanar CT image reconstructions of the cervical spine were also generated.  COMPARISON:  03/28/2006  FINDINGS: CT HEAD FINDINGS  Ventricles are normal in configuration. There is ventricular  and sulcal enlargement reflecting mild atrophy. No parenchymal masses or mass effect. There is no evidence of a recent infarct. Patchy white matter hypoattenuation is noted most consistent with moderate chronic microvascular ischemic change.  No extra-axial  masses or abnormal fluid collections.  There is no intracranial hemorrhage.  The visualized sinuses and mastoid air cells are clear. No skull fracture.  CT CERVICAL SPINE FINDINGS  There are well-defined lucent lines crossing the pedicles of C4, with a subtle oblique lucent line crossing the right lamina at C4. Although these could potentially be acute fractures a have a chronic appearance. There is no associated soft tissue edema or hemorrhage.  No other evidence of a fracture.  No spondylolisthesis. There is reversal of the normal cervical lordosis centered at C5. There is moderate loss of disk height at C5-C6 and C6-C7 with mild loss of disk height at C4-C5. There are prominent endplate osteophytes along the mid and lower cervical spine. Facet degenerative changes also noted bilaterally, greatest on the right at C2-C3 and on the left at C2-C3 and C3-C4. The bones are demineralized.  The soft tissues are unremarkable.  The lung apices show scarring, but no acute findings.  IMPRESSION: HEAD CT:  No acute intracranial abnormality.  No skull fracture.  CERVICAL CT: Defects are noted across the pedicles at C4 as well as the right C4 lamina. Although these could potentially reflect acute fractures, and a chronic appearance and potentially could be developmental. If the patient is a reliable historian, and does not have significant neck pain, the should be considered chronic or developmental. If there is neck pain or neck pain cannot be ascertained, consider followup cervical MRI.  No other evidence of a fracture.  Advanced degenerative changes.   Electronically Signed   By: Lajean Manes M.D.   On: 02/11/2014 14:23   Ct Cervical Spine Wo Contrast  02/11/2014   CLINICAL DATA:  Status post fall.  Pain.  EXAM: CT HEAD WITHOUT CONTRAST  CT CERVICAL SPINE WITHOUT CONTRAST  TECHNIQUE: Multidetector CT imaging of the head and cervical spine was performed following the standard protocol without intravenous contrast.  Multiplanar CT image reconstructions of the cervical spine were also generated.  COMPARISON:  03/28/2006  FINDINGS: CT HEAD FINDINGS  Ventricles are normal in configuration. There is ventricular and sulcal enlargement reflecting mild atrophy. No parenchymal masses or mass effect. There is no evidence of a recent infarct. Patchy white matter hypoattenuation is noted most consistent with moderate chronic microvascular ischemic change.  No extra-axial masses or abnormal fluid collections.  There is no intracranial hemorrhage.  The visualized sinuses and mastoid air cells are clear. No skull fracture.  CT CERVICAL SPINE FINDINGS  There are well-defined lucent lines crossing the pedicles of C4, with a subtle oblique lucent line crossing the right lamina at C4. Although these could potentially be acute fractures a have a chronic appearance. There is no associated soft tissue edema or hemorrhage.  No other evidence of a fracture.  No spondylolisthesis. There is reversal of the normal cervical lordosis centered at C5. There is moderate loss of disk height at C5-C6 and C6-C7 with mild loss of disk height at C4-C5. There are prominent endplate osteophytes along the mid and lower cervical spine. Facet degenerative changes also noted bilaterally, greatest on the right at C2-C3 and on the left at C2-C3 and C3-C4. The bones are demineralized.  The soft tissues are unremarkable.  The lung apices show scarring, but no acute findings.  IMPRESSION: HEAD CT:  No acute intracranial abnormality.  No skull fracture.  CERVICAL CT: Defects are noted across the pedicles at C4 as well as the right C4 lamina. Although these could potentially reflect acute fractures, and a chronic appearance and potentially could be developmental. If the patient is a reliable historian, and does not have significant neck pain, the should be considered chronic or developmental. If there is neck pain or neck pain cannot be ascertained, consider followup cervical  MRI.  No other evidence of a fracture.  Advanced degenerative changes.   Electronically Signed   By: Lajean Manes M.D.   On: 03-03-2014 14:23    ROS:  No recent f/c/n/v/wt loss PE:  Blood pressure 155/90, pulse 84, temperature 97.4 F (36.3 C), temperature source Oral, resp. rate 22, height '5\' 2"'  (1.575 m), weight 39.009 kg (86 lb), SpO2 96.00%. Thin healthy appearing elderly woman in nad.  A and O x 4.  Mood and affect normal.  EOMI.  Respirations unlabored.  R LE with healthy and intact skin.  2+ dp and pt pulses.  R LE slightly shortened.  Diminished sens to LT at the foot.  No lymphadenopathy  Assessment/Plan: R hip intertroch fx - to OR for IM nail.  The risks and benefits of the alternative treatment options have been discussed in detail.  The patient wishes to proceed with surgery and specifically understands risks of bleeding, infection, nerve damage, blood clots, need for additional surgery, amputation and death.   [name] 03-03-2014, 7:23 PM

## 2014-02-11 NOTE — Brief Op Note (Signed)
02/11/2014  8:55 PM  PATIENT:  Jenny Torres  80 y.o. female  PRE-OPERATIVE DIAGNOSIS:  right hip intertroch fracture  POST-OPERATIVE DIAGNOSIS:  same  Procedure(s):  Open treatment of right hip inter troch fracture with intramedullary nailing  SURGEON:  [name], MD  ASSISTANT: n/a  ANESTHESIA:   General  EBL:  100 cc  TOURNIQUET:  n/a  COMPLICATIONS:  None apparent  DISPOSITION:  Extubated, awake and stable to recovery.  DICTATION ID:  [id number]

## 2014-02-11 NOTE — Transfer of Care (Signed)
Immediate Anesthesia Transfer of Care Note  Patient: Jenny Torres  Procedure(s) Performed: Procedure(s): INTRAMEDULLARY (IM) NAIL INTERTROCHANTRIC (Right)  Patient Location: PACU  Anesthesia Type:General  Level of Consciousness: awake, alert , oriented and patient cooperative  Airway & Oxygen Therapy: Patient Spontanous Breathing and Patient connected to nasal cannula oxygen  Post-op Assessment: Report given to PACU RN, Post -op Vital signs reviewed and stable and Patient moving all extremities X 4  Post vital signs: Reviewed and stable  Complications: No apparent anesthesia complications

## 2014-02-11 NOTE — ED Notes (Signed)
Pt placed back on bedpan

## 2014-02-12 ENCOUNTER — Inpatient Hospital Stay (HOSPITAL_COMMUNITY): Payer: Medicare Other

## 2014-02-12 DIAGNOSIS — I10 Essential (primary) hypertension: Secondary | ICD-10-CM

## 2014-02-12 DIAGNOSIS — E43 Unspecified severe protein-calorie malnutrition: Secondary | ICD-10-CM | POA: Insufficient documentation

## 2014-02-12 DIAGNOSIS — D72829 Elevated white blood cell count, unspecified: Secondary | ICD-10-CM

## 2014-02-12 LAB — URINALYSIS, ROUTINE W REFLEX MICROSCOPIC
BILIRUBIN URINE: NEGATIVE
Glucose, UA: NEGATIVE mg/dL
Hgb urine dipstick: NEGATIVE
Ketones, ur: 15 mg/dL — AB
LEUKOCYTES UA: NEGATIVE
NITRITE: NEGATIVE
PH: 6.5 (ref 5.0–8.0)
Protein, ur: NEGATIVE mg/dL
SPECIFIC GRAVITY, URINE: 1.012 (ref 1.005–1.030)
Urobilinogen, UA: 0.2 mg/dL (ref 0.0–1.0)

## 2014-02-12 LAB — CBC
HCT: 22 % — ABNORMAL LOW (ref 36.0–46.0)
HEMATOCRIT: 25.1 % — AB (ref 36.0–46.0)
HEMOGLOBIN: 8.8 g/dL — AB (ref 12.0–15.0)
Hemoglobin: 7.8 g/dL — ABNORMAL LOW (ref 12.0–15.0)
MCH: 30.8 pg (ref 26.0–34.0)
MCH: 30.7 pg (ref 26.0–34.0)
MCHC: 35.1 g/dL (ref 30.0–36.0)
MCHC: 35.5 g/dL (ref 30.0–36.0)
MCV: 87.8 fL (ref 78.0–100.0)
MCV: 86.6 fL (ref 78.0–100.0)
Platelets: 161 10*3/uL (ref 150–400)
RBC: 2.86 MIL/uL — AB (ref 3.87–5.11)
RBC: 2.54 MIL/uL — ABNORMAL LOW (ref 3.87–5.11)
RDW: 12.7 % (ref 11.5–15.5)
RDW: 12.8 % (ref 11.5–15.5)
WBC: 12 10*3/uL — ABNORMAL HIGH (ref 4.0–10.5)
WBC: 16.4 10*3/uL — ABNORMAL HIGH (ref 4.0–10.5)

## 2014-02-12 LAB — BASIC METABOLIC PANEL
BUN: 11 mg/dL (ref 6–23)
CALCIUM: 8.4 mg/dL (ref 8.4–10.5)
CO2: 20 meq/L (ref 19–32)
Chloride: 95 mEq/L — ABNORMAL LOW (ref 96–112)
Creatinine, Ser: 0.47 mg/dL — ABNORMAL LOW (ref 0.50–1.10)
GFR calc Af Amer: >90 mL/min (ref >90)
GFR calc non Af Amer: 89 mL/min — ABNORMAL LOW (ref >90)
GLUCOSE: 137 mg/dL — AB (ref 70–99)
Potassium: 3.7 mEq/L (ref 3.7–5.3)
SODIUM: 129 meq/L — AB (ref 137–147)

## 2014-02-12 MED ORDER — GABAPENTIN 800 MG PO TABS
800.0000 mg | ORAL_TABLET | Freq: Three times a day (TID) | ORAL | Status: DC
  Filled 2014-02-12: qty 1

## 2014-02-12 MED ORDER — METOCLOPRAMIDE HCL 5 MG/ML IJ SOLN
5.0000 mg | Freq: Four times a day (QID) | INTRAMUSCULAR | Status: DC
  Administered 2014-02-12 – 2014-02-14 (×4): 5 mg via INTRAVENOUS
  Filled 2014-02-12 (×8): qty 1
  Filled 2014-02-12: qty 2

## 2014-02-12 MED ORDER — BOOST / RESOURCE BREEZE PO LIQD
1.0000 | Freq: Two times a day (BID) | ORAL | Status: DC
Start: 2014-02-12 — End: 2014-02-14
  Administered 2014-02-13: 1 via ORAL

## 2014-02-12 MED ORDER — GABAPENTIN 400 MG PO CAPS
800.0000 mg | ORAL_CAPSULE | Freq: Three times a day (TID) | ORAL | Status: DC
  Administered 2014-02-12 – 2014-02-14 (×7): 800 mg via ORAL
  Filled 2014-02-12 (×10): qty 2

## 2014-02-12 MED ORDER — METOPROLOL TARTRATE 12.5 MG HALF TABLET
12.5000 mg | ORAL_TABLET | Freq: Two times a day (BID) | ORAL | Status: DC
  Administered 2014-02-12 – 2014-02-14 (×4): 12.5 mg via ORAL
  Filled 2014-02-12 (×5): qty 1

## 2014-02-12 NOTE — Progress Notes (Signed)
Pt requested to get up to bedside commode to void and have bm. When sitting on side of bed she slumped forward and was unresponsive and pale. Pt placed back in bed in supine position and oxygen restarted. She responded quickly although she had no memory of sitting up. Vital signs are stable. Oxygen sat 99%.

## 2014-02-12 NOTE — Progress Notes (Signed)
PROGRESS NOTE  Jenny Torres [id number] DOB: Nov 21, 1927 DOA: 02/11/2014 PCP: Darlin Coco, MD  Assessment/Plan: Right femur fracture s/p repair Ortho PT/OT eval   Leukocytosis -monitor -check chest x ray U/A  Hypertension  Hold home meds  Peripheral neuropathy  Patient has peripheral neuropathy, likely due to the Raynauds disease.  Continue Neurontin   Nausea and vomiting  Started in the ED after patient received fentanyl  zofran reglan   Hyponatremia  ? Cause, likely due to dehydration. -gentlt IVF  COPD  Patient quit smoking 30 years ago, only uses rarely albuterol for shortness of breath  She is currently not in COPD exacerbation  Stable   Raynaud's disease  Stable  Patient has history of Raynauds disease.  No acute exacerbation at this time   Severe protein calorie malnutrition        Code Status: DNR Family Communication: patient Disposition Plan: admit   Consultants:  ortho  Procedures:  ORIF    HPI/Subjective: Nausea this AM  Objective: Filed Vitals:   02/12/14 0800  BP:   Pulse:   Temp:   Resp: 18    Intake/Output Summary (Last 24 hours) at 02/12/14 0956 Last data filed at 02/12/14 0550  Gross per 24 hour  Intake 1765.83 ml  Output    300 ml  Net 1465.83 ml   Filed Weights   02/11/14 1855 02/11/14 1908 02/11/14 2148  Weight: 39.009 kg (86 lb) 39.009 kg (86 lb) 39.009 kg (86 lb)    Exam:   General:  Uncomfortable appearing- basin next to face  Cardiovascular: mildly tachy  Respiratory: clear  Abdomen: decreased bowel sounds  Musculoskeletal: no edema   Data Reviewed: Basic Metabolic Panel:  Recent Labs Lab 02/11/14 1325 02/12/14 0420  NA 128* 129*  K 3.7 3.7  CL 88* 95*  CO2 25 20  GLUCOSE 107* 137*  BUN 10 11  CREATININE 0.49* 0.47*  CALCIUM 9.6 8.4   Liver Function Tests: No results found for this basename: AST, ALT, ALKPHOS, BILITOT, PROT, ALBUMIN,  in the last 168 hours No results  found for this basename: LIPASE, AMYLASE,  in the last 168 hours No results found for this basename: AMMONIA,  in the last 168 hours CBC:  Recent Labs Lab 02/11/14 1325 02/12/14 0420  WBC 7.0 12.0*  NEUTROABS 5.0  --   HGB 14.1 8.8*  HCT 39.2 25.1*  MCV 85.8 87.8  PLT 208 SPECIMEN CLOTTED   Cardiac Enzymes: No results found for this basename: CKTOTAL, CKMB, CKMBINDEX, TROPONINI,  in the last 168 hours BNP (last 3 results) No results found for this basename: PROBNP,  in the last 8760 hours CBG: No results found for this basename: GLUCAP,  in the last 168 hours  No results found for this or any previous visit (from the past 240 hour(s)).   Studies: Dg Pelvis 1-2 Views  02/11/2014   CLINICAL DATA:  Right hip pain after fall.  EXAM: PELVIS - 1-2 VIEW  COMPARISON:  None.  FINDINGS: Severe degenerative joint disease is seen involving both hip joints. Diffuse osteopenia is noted. Moderately displaced fracture is seen involving the trochanteric region of proximal right femur.  IMPRESSION: Moderately displaced intertrochanteric fracture of the proximal right femur. Severe degenerative joint disease is seen involving both hip joints.   Electronically Signed   By: Sabino Dick M.D.   On: 02/11/2014 14:37   Dg Hip Operative Right  02/12/2014   CLINICAL DATA:  Internal fixation of right hip fracture.  EXAM:  DG OPERATIVE RIGHT HIP  TECHNIQUE: A single spot fluoroscopic AP image of the right hip is submitted.  COMPARISON:  Right femur radiographs performed earlier today at 1:48 p.m.  FINDINGS: Four fluoroscopic C-arm images are provided from the OR. These demonstrate successful placement of an intramedullary rod and screws through the right femur, transfixing the patient's intertrochanteric fracture in grossly anatomic alignment. A mildly displaced lesser trochanteric fragment is again seen. No new fractures are identified.  IMPRESSION: Status post internal fixation of right femoral fracture in grossly  anatomic alignment.   Electronically Signed   By: Garald Balding M.D.   On: 02/12/2014 05:17   Dg Femur Right  02/11/2014   CLINICAL DATA:  Right hip injury and pain.  EXAM: RIGHT FEMUR - 2 VIEW  COMPARISON:  None.  FINDINGS: A mildly comminuted intratrochanteric fracture of the proximal right femur is noted with mild valgus angulation.  Diffuse osteopenia is present.  Severe degenerative changes in the right hip are noted.  IMPRESSION: Mildly comminuted intertrochanteric right femur fracture with mild valgus angulation.   Electronically Signed   By: Hassan Rowan M.D.   On: 02/11/2014 14:36   Ct Head Wo Contrast  02/11/2014   CLINICAL DATA:  Status post fall.  Pain.  EXAM: CT HEAD WITHOUT CONTRAST  CT CERVICAL SPINE WITHOUT CONTRAST  TECHNIQUE: Multidetector CT imaging of the head and cervical spine was performed following the standard protocol without intravenous contrast. Multiplanar CT image reconstructions of the cervical spine were also generated.  COMPARISON:  03/28/2006  FINDINGS: CT HEAD FINDINGS  Ventricles are normal in configuration. There is ventricular and sulcal enlargement reflecting mild atrophy. No parenchymal masses or mass effect. There is no evidence of a recent infarct. Patchy white matter hypoattenuation is noted most consistent with moderate chronic microvascular ischemic change.  No extra-axial masses or abnormal fluid collections.  There is no intracranial hemorrhage.  The visualized sinuses and mastoid air cells are clear. No skull fracture.  CT CERVICAL SPINE FINDINGS  There are well-defined lucent lines crossing the pedicles of C4, with a subtle oblique lucent line crossing the right lamina at C4. Although these could potentially be acute fractures a have a chronic appearance. There is no associated soft tissue edema or hemorrhage.  No other evidence of a fracture.  No spondylolisthesis. There is reversal of the normal cervical lordosis centered at C5. There is moderate loss of disk  height at C5-C6 and C6-C7 with mild loss of disk height at C4-C5. There are prominent endplate osteophytes along the mid and lower cervical spine. Facet degenerative changes also noted bilaterally, greatest on the right at C2-C3 and on the left at C2-C3 and C3-C4. The bones are demineralized.  The soft tissues are unremarkable.  The lung apices show scarring, but no acute findings.  IMPRESSION: HEAD CT:  No acute intracranial abnormality.  No skull fracture.  CERVICAL CT: Defects are noted across the pedicles at C4 as well as the right C4 lamina. Although these could potentially reflect acute fractures, and a chronic appearance and potentially could be developmental. If the patient is a reliable historian, and does not have significant neck pain, the should be considered chronic or developmental. If there is neck pain or neck pain cannot be ascertained, consider followup cervical MRI.  No other evidence of a fracture.  Advanced degenerative changes.   Electronically Signed   By: Lajean Manes M.D.   On: 02/11/2014 14:23   Ct Cervical Spine Wo Contrast  02/11/2014  CLINICAL DATA:  Status post fall.  Pain.  EXAM: CT HEAD WITHOUT CONTRAST  CT CERVICAL SPINE WITHOUT CONTRAST  TECHNIQUE: Multidetector CT imaging of the head and cervical spine was performed following the standard protocol without intravenous contrast. Multiplanar CT image reconstructions of the cervical spine were also generated.  COMPARISON:  03/28/2006  FINDINGS: CT HEAD FINDINGS  Ventricles are normal in configuration. There is ventricular and sulcal enlargement reflecting mild atrophy. No parenchymal masses or mass effect. There is no evidence of a recent infarct. Patchy white matter hypoattenuation is noted most consistent with moderate chronic microvascular ischemic change.  No extra-axial masses or abnormal fluid collections.  There is no intracranial hemorrhage.  The visualized sinuses and mastoid air cells are clear. No skull fracture.  CT  CERVICAL SPINE FINDINGS  There are well-defined lucent lines crossing the pedicles of C4, with a subtle oblique lucent line crossing the right lamina at C4. Although these could potentially be acute fractures a have a chronic appearance. There is no associated soft tissue edema or hemorrhage.  No other evidence of a fracture.  No spondylolisthesis. There is reversal of the normal cervical lordosis centered at C5. There is moderate loss of disk height at C5-C6 and C6-C7 with mild loss of disk height at C4-C5. There are prominent endplate osteophytes along the mid and lower cervical spine. Facet degenerative changes also noted bilaterally, greatest on the right at C2-C3 and on the left at C2-C3 and C3-C4. The bones are demineralized.  The soft tissues are unremarkable.  The lung apices show scarring, but no acute findings.  IMPRESSION: HEAD CT:  No acute intracranial abnormality.  No skull fracture.  CERVICAL CT: Defects are noted across the pedicles at C4 as well as the right C4 lamina. Although these could potentially reflect acute fractures, and a chronic appearance and potentially could be developmental. If the patient is a reliable historian, and does not have significant neck pain, the should be considered chronic or developmental. If there is neck pain or neck pain cannot be ascertained, consider followup cervical MRI.  No other evidence of a fracture.  Advanced degenerative changes.   Electronically Signed   By: Lajean Manes M.D.   On: 02/11/2014 14:23    Scheduled Meds: . docusate sodium  100 mg Oral BID  . enoxaparin (LOVENOX) injection  30 mg Subcutaneous Q24H  . gabapentin  800 mg Oral TID  . metoprolol tartrate  12.5 mg Oral BID  . senna  1 tablet Oral BID   Continuous Infusions: . sodium chloride 50 mL/hr at 02/12/14 0550   Antibiotics Given (last 72 hours)   None      Principal Problem:   Femur fracture, right Active Problems:   HYPERTENSION   COPD   Raynaud's disease  /phenomenon   Idiopathic peripheral neuropathy   Intertrochanteric fracture of right hip    Time spent: [facility] Pager [phone]. If 7PM-7AM, please contact night-coverage at [email or web address], password [facility] 02/12/2014, 9:56 AM  LOS: 1 day

## 2014-02-12 NOTE — Progress Notes (Signed)
Orthopedics Progress Note  Subjective: Stable overnight, a little nauseated today but pain better  Objective:  Filed Vitals:   02/12/14 0618  BP:   Pulse:   Temp:   Resp: 18    General: Awake and alert  Musculoskeletal: right hip incision CDI, dressing intact NVI distally Neurovascularly intact  Lab Results  Component Value Date   WBC 12.0* 02/12/2014   HGB 8.8* 02/12/2014   HCT 25.1* 02/12/2014   MCV 87.8 02/12/2014   PLT SPECIMEN CLOTTED 02/12/2014       Component Value Date/Time   NA 129* 02/12/2014 0420   K 3.7 02/12/2014 0420   CL 95* 02/12/2014 0420   CO2 20 02/12/2014 0420   GLUCOSE 137* 02/12/2014 0420   BUN 11 02/12/2014 0420   CREATININE 0.47* 02/12/2014 0420   CALCIUM 8.4 02/12/2014 0420   GFRNONAA 89* 02/12/2014 0420   GFRAA >90 02/12/2014 0420    Lab Results  Component Value Date   INR 0.99 02/11/2014    Assessment/Plan: POD #1 s/p Procedure(s): INTRAMEDULLARY (IM) NAIL INTERTROCHANTRIC Stable this AM, WBAT per Dr Doran Durand with PT DVT prophylaxis: mechanical and lovenox  Doran Heater. Veverly Fells, MD 02/12/2014 9:07 AM

## 2014-02-12 NOTE — Evaluation (Signed)
Physical Therapy Evaluation Patient Details Name: Jenny Torres MRN: [id number] DOB: 12/19/1927 Today's Date: 02/12/2014   History of Present Illness  S/P right IM nail R hip due to fall. Also hit head in fall  Clinical Impression  Pt admitted for above issues. Pt currently with functional limitations due to the deficits listed below (see PT Problem List).  Pt will benefit from skilled PT to increase their independence and safety with mobility to allow discharge to SNF and eventually back home alone when she is able to safely care for herself.      Follow Up Recommendations SNF;Supervision for mobility/OOB    Equipment Recommendations  None recommended by PT (Pt has a RW)    Recommendations for Other Services       Precautions / Restrictions Precautions Precautions: Fall Restrictions Weight Bearing Restrictions: Yes RLE Weight Bearing: Weight bearing as tolerated      Mobility  Bed Mobility Overal bed mobility:  (NT, pt up in chair) Bed Mobility: Supine to Sit     Supine to sit: Mod assist;HOB elevated (and using bed pad)        Transfers Overall transfer level: Needs assistance Equipment used: Rolling walker (2 wheeled) Transfers: Sit to/from Stand Sit to Stand: Mod assist Stand pivot transfers: +2 physical assistance;Max assist       General transfer comment: Stood for about 45 seconds. Pt states she felt "woozy" when standing  Ambulation/Gait                Stairs            Wheelchair Mobility    Modified Rankin (Stroke Patients Only)       Balance Overall balance assessment: Needs assistance Sitting-balance support: Feet supported Sitting balance-Leahy Scale: Fair (in recliner chair) Sitting balance - Comments: VC's for attempting to maintain midline sitting (cues for forward,backward and to right side)   Standing balance support: Bilateral upper extremity supported Standing balance-Leahy Scale: Poor Standing balance comment:  stood for about 45 seconds with RW and min assist                             Pertinent Vitals/Pain 3/10 R hip pain at rest. Positioned comfortably at end of session.      Home Living Family/patient expects to be discharged to:: Skilled nursing facility Living Arrangements: Alone               Additional Comments: husband with dementia/Alz lives at [place]: Independent with assistive device(s)         Comments: Used cane in house (Lives in 1 level house with 2 step entry)     Hand Dominance        Extremity/Trunk Assessment   Upper Extremity Assessment: Overall WFL for tasks assessed           Lower Extremity Assessment: RLE deficits/detail;LLE deficits/detail RLE Deficits / Details: limited ROM/strength throughout RLE. Limited AROM ankle d/t neuropathy LLE Deficits / Details: decreased ankle AROM due to neurapathy     Communication   Communication: No difficulties  Cognition Arousal/Alertness: Awake/alert Behavior During Therapy: WFL for tasks assessed/performed Overall Cognitive Status: Within Functional Limits for tasks assessed Area of Impairment: Problem solving             Problem Solving: Slow processing;Difficulty sequencing;Requires verbal cues;Requires tactile cues      General Comments General comments (skin integrity,  edema, etc.): pt alert and cooperative with treatment. No family present.     Exercises Total Joint Exercises Ankle Circles/Pumps: AROM;Both;10 reps Quad Sets: AROM;Right;10 reps Heel Slides: AAROM;Right;10 reps Hip ABduction/ADduction: AAROM;Right;10 reps Long Arc Quad: AAROM;Right;Left;10 reps;Seated      Assessment/Plan    PT Assessment Patient needs continued PT services  PT Diagnosis Difficulty walking   PT Problem List Decreased strength;Decreased range of motion;Decreased activity tolerance;Decreased balance;Decreased mobility;Decreased knowledge of  use of DME;Cardiopulmonary status limiting activity;Pain  PT Treatment Interventions DME instruction;Gait training;Therapeutic exercise;Balance training;Patient/family education   PT Goals (Current goals can be found in the Care Plan section) Acute Rehab PT Goals Patient Stated Goal: to return home and to get stronger PT Goal Formulation: With patient Time For Goal Achievement: 02/19/14 Potential to Achieve Goals: Good    Frequency Min 5X/week   Barriers to discharge Decreased caregiver support      Co-evaluation               End of Session Equipment Utilized During Treatment: Gait belt Activity Tolerance: Patient tolerated treatment well (did become "woozy" with standing.  VSS throughout session.) Patient left: in chair;with call bell/phone within reach Nurse Communication: Mobility status         Time: 1449-1520 PT Time Calculation (min): 31 min   Charges:   PT Evaluation $Initial PT Evaluation Tier I: 1 Procedure PT Treatments $Therapeutic Exercise: 8-22 mins   PT G CodesMelvern Banker 02/12/2014, 4:05 PM Lavonia Dana, [place] 02/12/2014

## 2014-02-12 NOTE — Progress Notes (Signed)
INITIAL NUTRITION ASSESSMENT  DOCUMENTATION CODES Per approved criteria  -Severe malnutrition in the context of chronic illness   INTERVENTION: 1.  General healthful diet; encourage intake of foods and beverages as able.  RD to follow and assess for nutritional adequacy.  2.  Supplements; Resource Breeze po BID, each supplement provides 250 kcal and 9 grams of protein  NUTRITION DIAGNOSIS: Malnturition related to chronic illness as evidenced by nutrition focused physical exam.   Monitor:  1.  Food/Beverage; pt meeting >/=90% estimated needs with tolerance. 2.  Wt/wt change; monitor trends  Reason for Assessment: MST  80 y.o. female  Admitting Dx: Femur fracture, right  ASSESSMENT: Pt admitted with femur fracture s/p fall.  RD met with pt who reports "I do what I can" in terms of nutrition at home.  She lives at home alone and has a friend who helps her with meals and grocery shopping.  Pt reports that she feels she needs to improve her nutrition and intake at home, however is resistant to nutrition education stating "I know" to all of this RD's recommendations.  Nutrition Focused Physical Exam: Subcutaneous Fat:  Orbital Region: moderate wasting Upper Arm Region: severe wasting Thoracic and Lumbar Region: severe wasting  Muscle:  Temple Region: severe wasting Clavicle Bone Region: severe wasting Clavicle and Acromion Bone Region: severe ewasting Scapular Bone Region: severe wasting Dorsal Hand: severe wasting Patellar Region: severe wasting Anterior Thigh Region: severe wasting Posterior Calf Region: severe wasting  Edema: none present   Pt with chronic underweight.  Prior to 2009/2010, she was near her ideal body weight of 105-110 lbs. She admits to weighing more "when I was younger and had kids."  She states her most recent usual weight is 85-90 lbs.   Pt meets criteria for severe MALNUTRITION in the context of chronic illness as evidenced by subcutaneous fat and  muscle wasting.  Height: Ht Readings from Last 1 Encounters:  02/11/14 '5\' 2"'  (1.575 m)    Weight: Wt Readings from Last 1 Encounters:  02/11/14 86 lb (39.009 kg)    Ideal Body Weight: 110 lbs  % Ideal Body Weight: 78%  Wt Readings from Last 10 Encounters:  02/11/14 86 lb (39.009 kg)  02/11/14 86 lb (39.009 kg)  01/15/14 88 lb (39.917 kg)  08/28/13 87 lb (39.463 kg)  08/07/13 90 lb (40.824 kg)  04/28/13 89 lb 6.4 oz (40.552 kg)  01/21/13 82 lb (37.195 kg)  12/29/12 92 lb 3.2 oz (41.822 kg)  12/15/12 93 lb 3.2 oz (42.275 kg)  11/30/12 92 lb (41.731 kg)    Usual Body Weight: 105+ lbs prior to 2010, more recently 100-90 lbs  % Usual Body Weight: 86%  BMI:  Body mass index is 15.73 kg/(m^2).  Estimated Nutritional Needs: Kcal: 1200-1450 Protein: 70-78g Fluid: ~1.5 L/day  Skin: incision to hip  Diet Order: Clear Liquid  EDUCATION NEEDS: -Education needs addressed   Intake/Output Summary (Last 24 hours) at 02/12/14 1150 Last data filed at 02/12/14 0550  Gross per 24 hour  Intake 1765.83 ml  Output    300 ml  Net 1465.83 ml    Last BM: 5/23   Labs:   Recent Labs Lab 02/11/14 1325 02/12/14 0420  NA 128* 129*  K 3.7 3.7  CL 88* 95*  CO2 25 20  BUN 10 11  CREATININE 0.49* 0.47*  CALCIUM 9.6 8.4  GLUCOSE 107* 137*    CBG (last 3)  No results found for this basename: GLUCAP,  in the last  72 hours  Scheduled Meds: . docusate sodium  100 mg Oral BID  . enoxaparin (LOVENOX) injection  30 mg Subcutaneous Q24H  . gabapentin  800 mg Oral TID  . metoCLOPramide (REGLAN) injection  5 mg Intravenous 4 times per day  . metoprolol tartrate  12.5 mg Oral BID  . senna  1 tablet Oral BID    Continuous Infusions: . sodium chloride 50 mL/hr at 02/12/14 0550    Past Medical History  Diagnosis Date  . Anxiety   . Depression   . COPD (chronic obstructive pulmonary disease)   . HTN (hypertension)   . Gastritis   . Polyp of colon   . Hemorrhoids,  external   . Diverticula, colon   . GERD (gastroesophageal reflux disease)   . Carotid artery occlusion   . Idiopathic peripheral neuropathy     chronic  . Hyperlipidemia   . MVP (mitral valve prolapse)     stable  . Palpitations   . Osteoporosis   . Hyperlipidemia     Past Surgical History  Procedure Laterality Date  . Cholecystectomy    . C-sections      x2    Brynda Greathouse, MS RD LDN Clinical Inpatient Dietitian Pager: [phone] Weekend/After hours pager: [phone]

## 2014-02-12 NOTE — Progress Notes (Signed)
PT Note  Pt currently with bedrest orders.  MD- Please increase activity orders as appropriate to allow for complete PT evaluation. Thanks    Torres, Jenny  [phone] 02/12/2014

## 2014-02-12 NOTE — Evaluation (Signed)
Occupational Therapy Evaluation Patient Details Name: Jenny Torres MRN: [id number] DOB: Aug 08, 1928 Today's Date: 02/12/2014    History of Present Illness S/P right IM nail due to fail   Clinical Impression   This 80 yo female admitted and underwent above presents to acute OT with decreased AROM RLE, increased pain RLE, decreased sitting/standing balance, decreased problem solving all affecting pt's ability to care for herself at home alone. Pt will benefit from acute OT with follow up at SNF to get to a Mod I level.    Follow Up Recommendations  SNF    Equipment Recommendations   (TBD at next venue)       Precautions / Restrictions Precautions Precautions: Fall Restrictions RLE Weight Bearing: Weight bearing as tolerated      Mobility Bed Mobility Overal bed mobility: Needs Assistance Bed Mobility: Supine to Sit     Supine to sit: Mod assist;HOB elevated (and using bed pad)        Transfers Overall transfer level: Needs assistance Equipment used: Rolling walker (2 wheeled) Transfers: Sit to/from [facility] Sit to Stand: Mod assist Stand pivot transfers: +2 physical assistance;Max assist       General transfer comment: Pt initially on standing took 2 small steps with LLE, but then stopped following commands, had to pivot pt and sit her in recliner.    Balance Overall balance assessment: Needs assistance Sitting-balance support: Feet supported;Bilateral upper extremity supported Sitting balance-Leahy Scale: Poor Sitting balance - Comments: VC's for attempting to maintain midline sitting (cues for forward,backward and to right side)   Standing balance support: Bilateral upper extremity supported Standing balance-Leahy Scale: Zero                              ADL Overall ADL's : Needs assistance/impaired Eating/Feeding: Independent;Sitting   Grooming: Set up;Sitting   Upper Body Bathing: Set up;Sitting   Lower Body Bathing:  Maximal assistance;Sit to/from stand   Upper Body Dressing : Minimal assistance;Sitting   Lower Body Dressing: Total assistance;Sit to/from stand   Toilet Transfer: Maximal assistance;+2 for physical assistance;Cueing for sequencing;Stand-pivot (Bed>recliner going to pt's left)   Toileting- Clothing Manipulation and Hygiene: Total assistance;Sit to/from stand       Functional mobility during ADLs: +2 for physical assistance;Maximal assistance;Rolling walker                 Pertinent Vitals/Pain FACES 6/10; repositioned from bed to recliner        Extremity/Trunk Assessment Upper Extremity Assessment Upper Extremity Assessment: Overall WFL for tasks assessed           Communication Communication Communication: No difficulties   Cognition Arousal/Alertness: Awake/alert Behavior During Therapy: WFL for tasks assessed/performed Overall Cognitive Status: Impaired/Different from baseline Area of Impairment: Problem solving             Problem Solving: Slow processing;Difficulty sequencing;Requires verbal cues;Requires tactile cues                Home Living Family/patient expects to be discharged to:: Skilled nursing facility Living Arrangements: Alone                               Additional Comments: husband with dementia/Alz at lives at [facility]      Prior Functioning/Environment Level of Independence: Independent with assistive device(s)        Comments: Used spc in house  OT Diagnosis: Generalized weakness;Cognitive deficits;Acute pain   OT Problem List: Decreased strength;Decreased range of motion;Decreased activity tolerance;Impaired balance (sitting and/or standing);Decreased cognition;Decreased safety awareness;Decreased knowledge of use of DME or AE;Pain   OT Treatment/Interventions: Self-care/ADL training;DME and/or AE instruction;Cognitive remediation/compensation;Balance training;Patient/family education    OT  Goals(Current goals can be found in the care plan section) Acute Rehab OT Goals Patient Stated Goal: to figure out where she is going from here OT Goal Formulation: With patient Time For Goal Achievement: 02/19/14 Potential to Achieve Goals: Good  OT Frequency: Min 2X/week   Barriers to D/C: Decreased caregiver support (husband is at [employer])             End of [place] During Treatment: Gait belt;Rolling walker Nurse Communication:  (Pt appeared to "pass out")  Activity Tolerance: Patient limited by fatigue (limited by appearing to "pass out" (no words and not following commands)) Patient left: in chair;with call bell/phone within reach   Time: 1345-1408 OT Time Calculation (min): 23 min Charges:  OT General Charges $OT Visit: 1 Procedure OT Evaluation $Initial OT Evaluation Tier I: 1 Procedure OT Treatments $Self Care/Home Management : 8-22 mins  Almon Register [phone] 02/12/2014, 3:41 PM

## 2014-02-13 ENCOUNTER — Encounter (HOSPITAL_COMMUNITY): Payer: Self-pay | Admitting: Orthopedic Surgery

## 2014-02-13 DIAGNOSIS — D62 Acute posthemorrhagic anemia: Secondary | ICD-10-CM

## 2014-02-13 DIAGNOSIS — F411 Generalized anxiety disorder: Secondary | ICD-10-CM

## 2014-02-13 LAB — CBC
HEMATOCRIT: 18.6 % — AB (ref 36.0–46.0)
Hemoglobin: 6.5 g/dL — CL (ref 12.0–15.0)
MCH: 31.1 pg (ref 26.0–34.0)
MCHC: 34.9 g/dL (ref 30.0–36.0)
MCV: 89 fL (ref 78.0–100.0)
PLATELETS: 151 10*3/uL (ref 150–400)
RBC: 2.09 MIL/uL — ABNORMAL LOW (ref 3.87–5.11)
RDW: 13.3 % (ref 11.5–15.5)
WBC: 11.3 10*3/uL — ABNORMAL HIGH (ref 4.0–10.5)

## 2014-02-13 LAB — BASIC METABOLIC PANEL
BUN: 17 mg/dL (ref 6–23)
CO2: 25 mEq/L (ref 19–32)
Calcium: 8.4 mg/dL (ref 8.4–10.5)
Chloride: 97 mEq/L (ref 96–112)
Creatinine, Ser: 0.57 mg/dL (ref 0.50–1.10)
GFR, EST NON AFRICAN AMERICAN: 83 mL/min — AB (ref >90)
Glucose, Bld: 140 mg/dL — ABNORMAL HIGH (ref 70–99)
POTASSIUM: 4.1 meq/L (ref 3.7–5.3)
SODIUM: 131 meq/L — AB (ref 137–147)

## 2014-02-13 LAB — PREPARE RBC (CROSSMATCH)

## 2014-02-13 LAB — ABO/RH: ABO/RH(D): O POS

## 2014-02-13 MED ORDER — TEMAZEPAM 7.5 MG PO CAPS
7.5000 mg | ORAL_CAPSULE | Freq: Every evening | ORAL | Status: DC | PRN
  Administered 2014-02-14: 7.5 mg via ORAL
  Filled 2014-02-13: qty 1

## 2014-02-13 MED ORDER — ACETAMINOPHEN 325 MG PO TABS: 650.0000 mg | ORAL_TABLET | Freq: Four times a day (QID) | ORAL | Status: DC | PRN

## 2014-02-13 MED ORDER — ENOXAPARIN SODIUM 30 MG/0.3ML ~~LOC~~ SOLN: 30.0000 mg | SUBCUTANEOUS | Status: DC

## 2014-02-13 MED ORDER — TRAMADOL HCL 50 MG PO TABS: 50.0000 mg | ORAL_TABLET | Freq: Four times a day (QID) | ORAL | Status: DC | PRN

## 2014-02-13 NOTE — Progress Notes (Signed)
Subjective: 2 Days Post-Op Procedure(s) (LRB): INTRAMEDULLARY (IM) NAIL INTERTROCHANTRIC (Right) Patient reports pain as mild.  No c/o.  Objective: Vital signs in last 24 hours: Temp:  [98.3 F (36.8 C)-98.6 F (37 C)] 98.6 F (37 C) (05/26 0532) Pulse Rate:  [94-105] 94 (05/26 0532) Resp:  [16-18] 18 (05/26 0532) BP: (102-113)/(42-53) 113/53 mmHg (05/26 0532) SpO2:  [96 %-98 %] 97 % (05/26 0532)  Intake/Output from previous day: 05/25 0701 - 05/26 0700 In: 120 [P.O.:120] Out: 1425 [Urine:1425] Intake/Output this shift: Total I/O In: 120 [P.O.:120] Out: 425 [Urine:425]   Recent Labs  02/11/14 1325 02/12/14 0420 02/12/14 0935  HGB 14.1 8.8* 7.8*    Recent Labs  02/12/14 0420 02/12/14 0935  WBC 12.0* 16.4*  RBC 2.86* 2.54*  HCT 25.1* 22.0*  PLT SPECIMEN CLOTTED 161    Recent Labs  02/11/14 1325 02/12/14 0420  NA 128* 129*  K 3.7 3.7  CL 88* 95*  CO2 25 20  BUN 10 11  CREATININE 0.49* 0.47*  GLUCOSE 107* 137*  CALCIUM 9.6 8.4    Recent Labs  02/11/14 1325  INR 0.99    Elderly woman in nad.  R hip dressed and dry.  NVI at [place].  Assessment/Plan: 2 Days Post-Op Procedure(s) (LRB): INTRAMEDULLARY (IM) NAIL INTERTROCHANTRIC (Right) Up with therapy  WBAT on R LE.  Continue PT and OT.  Lovenox for DVT prophlyaxis.  [name] 02/13/2014, 6:51 AM

## 2014-02-13 NOTE — Progress Notes (Signed)
Utilization review completed.  

## 2014-02-13 NOTE — Progress Notes (Signed)
PT Cancellation Note  Patient Details Name: Jenny Torres MRN: [id number] DOB: Jan 05, 1928   Cancelled Treatment:    Reason Eval/Treat Not Completed: Medical issues which prohibited therapy. Patients Hgb 6.5. Will hold therapy today and check back in on patient tomorrow after transfusions complete.   Tonia Brooms [name] 02/13/2014, 11:03 AM

## 2014-02-13 NOTE — Progress Notes (Signed)
Patient unable to spontaneous void since last in and out cath at 1830 yesterday.  Patient attempted to void on bedpan for 45 minutes around 0100.  Bladder scan at 0500 revealed 317mL.  In and out cath at 0640 emptied 441mL of clear, yellow urine with no odor.  Continue to monitor.

## 2014-02-13 NOTE — Progress Notes (Signed)
CRITICAL VALUE ALERT  Critical value received:  Hemoglobin 6.5  Date of notification: 02/13/2014  Time of notification:  1050  Critical value read back: yes  Nurse who received alert: Prudencio Pair RN  MD notified (1st page): Dr Eulogio Bear  Time of first page:  1050  MD notified (2nd page): n/a  Time of second page:  Responding MD: Dr Eliseo Squires  Time MD responded: 1056

## 2014-02-13 NOTE — Progress Notes (Signed)
PROGRESS NOTE  Jenny Torres UUV:[id number] DOB: [birth year] DOA: 02/11/2014 PCP: Darlin Coco, MD  Assessment/Plan: Right femur fracture s/p repair Ortho PT/OT eval   ABLA -transfuse 2 units  Leukocytosis -monitor -chest x ray- ok U/A- no WBC  Hypertension  Hold home meds  Peripheral neuropathy  Patient has peripheral neuropathy, likely due to the Raynauds disease.  Continue Neurontin   Nausea and vomiting  resolved   Hyponatremia  ? Cause, likely due to dehydration. -await labs  COPD  Patient quit smoking 30 years ago, only uses rarely albuterol for shortness of breath  She is currently not in COPD exacerbation  Stable   Raynaud's disease  Stable  Patient has history of Raynauds disease.  No acute exacerbation at this time   Severe protein calorie malnutrition        Code Status: DNR Family Communication: patient Disposition Plan: admit   Consultants:  ortho  Procedures:  ORIF    HPI/Subjective: Sleepy this AM, await AM labs  Objective: Filed Vitals:   02/13/14 0532  BP: 113/53  Pulse: 94  Temp: 98.6 F (37 C)  Resp: 18    Intake/Output Summary (Last 24 hours) at 02/13/14 0747 Last data filed at 02/13/14 4742  Gross per 24 hour  Intake    720 ml  Output   1425 ml  Net   -705 ml   Filed Weights   02/11/14 1855 02/11/14 1908 02/11/14 2148  Weight: 39.009 kg (86 lb) 39.009 kg (86 lb) 39.009 kg (86 lb)    Exam:   General:  Sleepy but will awaken  Cardiovascular: mildly tachy  Respiratory: clear  Abdomen: +BS, soft  Musculoskeletal: no edema   Data Reviewed: Basic Metabolic Panel:  Recent Labs Lab 02/11/14 1325 02/12/14 0420  NA 128* 129*  K 3.7 3.7  CL 88* 95*  CO2 25 20  GLUCOSE 107* 137*  BUN 10 11  CREATININE 0.49* 0.47*  CALCIUM 9.6 8.4   Liver Function Tests: No results found for this basename: AST, ALT, ALKPHOS, BILITOT, PROT, ALBUMIN,  in the last 168 hours No results found for this  basename: LIPASE, AMYLASE,  in the last 168 hours No results found for this basename: AMMONIA,  in the last 168 hours CBC:  Recent Labs Lab 02/11/14 1325 02/12/14 0420 02/12/14 0935  WBC 7.0 12.0* 16.4*  NEUTROABS 5.0  --   --   HGB 14.1 8.8* 7.8*  HCT 39.2 25.1* 22.0*  MCV 85.8 87.8 86.6  PLT 208 SPECIMEN CLOTTED 161   Cardiac Enzymes: No results found for this basename: CKTOTAL, CKMB, CKMBINDEX, TROPONINI,  in the last 168 hours BNP (last 3 results) No results found for this basename: PROBNP,  in the last 8760 hours CBG: No results found for this basename: GLUCAP,  in the last 168 hours  No results found for this or any previous visit (from the past 240 hour(s)).   Studies: Dg Pelvis 1-2 Views  02/11/2014   CLINICAL DATA:  Right hip pain after fall.  EXAM: PELVIS - 1-2 VIEW  COMPARISON:  None.  FINDINGS: Severe degenerative joint disease is seen involving both hip joints. Diffuse osteopenia is noted. Moderately displaced fracture is seen involving the trochanteric region of proximal right femur.  IMPRESSION: Moderately displaced intertrochanteric fracture of the proximal right femur. Severe degenerative joint disease is seen involving both hip joints.   Electronically Signed   By: Sabino Dick M.D.   On: 02/11/2014 14:37   Dg Hip Operative Right  02/12/2014   CLINICAL DATA:  Internal fixation of right hip fracture.  EXAM: DG OPERATIVE RIGHT HIP  TECHNIQUE: A single spot fluoroscopic AP image of the right hip is submitted.  COMPARISON:  Right femur radiographs performed earlier today at 1:48 p.m.  FINDINGS: Four fluoroscopic C-arm images are provided from the OR. These demonstrate successful placement of an intramedullary rod and screws through the right femur, transfixing the patient's intertrochanteric fracture in grossly anatomic alignment. A mildly displaced lesser trochanteric fragment is again seen. No new fractures are identified.  IMPRESSION: Status post internal fixation of  right femoral fracture in grossly anatomic alignment.   Electronically Signed   By: Garald Balding M.D.   On: 02/12/2014 05:17   Dg Femur Right  02/11/2014   CLINICAL DATA:  Right hip injury and pain.  EXAM: RIGHT FEMUR - 2 VIEW  COMPARISON:  None.  FINDINGS: A mildly comminuted intratrochanteric fracture of the proximal right femur is noted with mild valgus angulation.  Diffuse osteopenia is present.  Severe degenerative changes in the right hip are noted.  IMPRESSION: Mildly comminuted intertrochanteric right femur fracture with mild valgus angulation.   Electronically Signed   By: Hassan Rowan M.D.   On: 02/11/2014 14:36   Ct Head Wo Contrast  02/11/2014   CLINICAL DATA:  Status post fall.  Pain.  EXAM: CT HEAD WITHOUT CONTRAST  CT CERVICAL SPINE WITHOUT CONTRAST  TECHNIQUE: Multidetector CT imaging of the head and cervical spine was performed following the standard protocol without intravenous contrast. Multiplanar CT image reconstructions of the cervical spine were also generated.  COMPARISON:  03/28/2006  FINDINGS: CT HEAD FINDINGS  Ventricles are normal in configuration. There is ventricular and sulcal enlargement reflecting mild atrophy. No parenchymal masses or mass effect. There is no evidence of a recent infarct. Patchy white matter hypoattenuation is noted most consistent with moderate chronic microvascular ischemic change.  No extra-axial masses or abnormal fluid collections.  There is no intracranial hemorrhage.  The visualized sinuses and mastoid air cells are clear. No skull fracture.  CT CERVICAL SPINE FINDINGS  There are well-defined lucent lines crossing the pedicles of C4, with a subtle oblique lucent line crossing the right lamina at C4. Although these could potentially be acute fractures a have a chronic appearance. There is no associated soft tissue edema or hemorrhage.  No other evidence of a fracture.  No spondylolisthesis. There is reversal of the normal cervical lordosis centered at C5.  There is moderate loss of disk height at C5-C6 and C6-C7 with mild loss of disk height at C4-C5. There are prominent endplate osteophytes along the mid and lower cervical spine. Facet degenerative changes also noted bilaterally, greatest on the right at C2-C3 and on the left at C2-C3 and C3-C4. The bones are demineralized.  The soft tissues are unremarkable.  The lung apices show scarring, but no acute findings.  IMPRESSION: HEAD CT:  No acute intracranial abnormality.  No skull fracture.  CERVICAL CT: Defects are noted across the pedicles at C4 as well as the right C4 lamina. Although these could potentially reflect acute fractures, and a chronic appearance and potentially could be developmental. If the patient is a reliable historian, and does not have significant neck pain, the should be considered chronic or developmental. If there is neck pain or neck pain cannot be ascertained, consider followup cervical MRI.  No other evidence of a fracture.  Advanced degenerative changes.   Electronically Signed   By: Dedra Skeens.D.  On: 02/11/2014 14:23   Ct Cervical Spine Wo Contrast  02/11/2014   CLINICAL DATA:  Status post fall.  Pain.  EXAM: CT HEAD WITHOUT CONTRAST  CT CERVICAL SPINE WITHOUT CONTRAST  TECHNIQUE: Multidetector CT imaging of the head and cervical spine was performed following the standard protocol without intravenous contrast. Multiplanar CT image reconstructions of the cervical spine were also generated.  COMPARISON:  03/28/2006  FINDINGS: CT HEAD FINDINGS  Ventricles are normal in configuration. There is ventricular and sulcal enlargement reflecting mild atrophy. No parenchymal masses or mass effect. There is no evidence of a recent infarct. Patchy white matter hypoattenuation is noted most consistent with moderate chronic microvascular ischemic change.  No extra-axial masses or abnormal fluid collections.  There is no intracranial hemorrhage.  The visualized sinuses and mastoid air cells are  clear. No skull fracture.  CT CERVICAL SPINE FINDINGS  There are well-defined lucent lines crossing the pedicles of C4, with a subtle oblique lucent line crossing the right lamina at C4. Although these could potentially be acute fractures a have a chronic appearance. There is no associated soft tissue edema or hemorrhage.  No other evidence of a fracture.  No spondylolisthesis. There is reversal of the normal cervical lordosis centered at C5. There is moderate loss of disk height at C5-C6 and C6-C7 with mild loss of disk height at C4-C5. There are prominent endplate osteophytes along the mid and lower cervical spine. Facet degenerative changes also noted bilaterally, greatest on the right at C2-C3 and on the left at C2-C3 and C3-C4. The bones are demineralized.  The soft tissues are unremarkable.  The lung apices show scarring, but no acute findings.  IMPRESSION: HEAD CT:  No acute intracranial abnormality.  No skull fracture.  CERVICAL CT: Defects are noted across the pedicles at C4 as well as the right C4 lamina. Although these could potentially reflect acute fractures, and a chronic appearance and potentially could be developmental. If the patient is a reliable historian, and does not have significant neck pain, the should be considered chronic or developmental. If there is neck pain or neck pain cannot be ascertained, consider followup cervical MRI.  No other evidence of a fracture.  Advanced degenerative changes.   Electronically Signed   By: Lajean Manes M.D.   On: 02/11/2014 14:23   Dg Chest Port 1 View  02/12/2014   CLINICAL DATA:  Leukocytosis.  EXAM: PORTABLE CHEST - 1 VIEW  COMPARISON:  Radiographs 11/01/2013.  CT 10/06/2012.  FINDINGS: The heart size and mediastinal contours are stable. There is stable bilateral superior hilar retraction. Biapical pleural parenchymal scarring, lesser nodularity at the lung bases and calcified right hilar/mediastinal lymph nodes are grossly unchanged. No superimposed  airspace disease or significant pleural effusion is seen.  IMPRESSION: Stable chronic lung disease most consistent with indolent infection as correlated with prior CT. No acute superimposed findings identified.   Electronically Signed   By: Camie Patience M.D.   On: 02/12/2014 11:28    Scheduled Meds: . docusate sodium  100 mg Oral BID  . enoxaparin (LOVENOX) injection  30 mg Subcutaneous Q24H  . feeding supplement (RESOURCE BREEZE)  1 Container Oral BID BM  . gabapentin  800 mg Oral TID  . metoCLOPramide (REGLAN) injection  5 mg Intravenous 4 times per day  . metoprolol tartrate  12.5 mg Oral BID  . senna  1 tablet Oral BID   Continuous Infusions:   Antibiotics Given (last 72 hours)   None  Principal Problem:   Femur fracture, right Active Problems:   HYPERTENSION   COPD   Raynaud's disease /phenomenon   Idiopathic peripheral neuropathy   Intertrochanteric fracture of right hip   Protein-calorie malnutrition, severe    Time spent: [facility] Pager [phone]. If 7PM-7AM, please contact night-coverage at [email or web address], password [email or web address] 02/13/2014, 7:47 AM  LOS: 2 days

## 2014-02-14 DIAGNOSIS — E871 Hypo-osmolality and hyponatremia: Secondary | ICD-10-CM

## 2014-02-14 LAB — CBC
HCT: 23.7 % — ABNORMAL LOW (ref 36.0–46.0)
Hemoglobin: 8.4 g/dL — ABNORMAL LOW (ref 12.0–15.0)
MCH: 30.3 pg (ref 26.0–34.0)
MCHC: 35.4 g/dL (ref 30.0–36.0)
MCV: 85.6 fL (ref 78.0–100.0)
Platelets: DECREASED 10*3/uL (ref 150–400)
RBC: 2.77 MIL/uL — AB (ref 3.87–5.11)
RDW: 14.6 % (ref 11.5–15.5)
WBC: 12.6 10*3/uL — AB (ref 4.0–10.5)

## 2014-02-14 LAB — TYPE AND SCREEN
ABO/RH(D): O POS
Antibody Screen: NEGATIVE
UNIT DIVISION: 0
Unit division: 0

## 2014-02-14 LAB — BASIC METABOLIC PANEL
BUN: 17 mg/dL (ref 6–23)
CHLORIDE: 93 meq/L — AB (ref 96–112)
CO2: 25 mEq/L (ref 19–32)
Calcium: 8 mg/dL — ABNORMAL LOW (ref 8.4–10.5)
Creatinine, Ser: 0.53 mg/dL (ref 0.50–1.10)
GFR calc Af Amer: >90 mL/min (ref >90)
GFR calc non Af Amer: 85 mL/min — ABNORMAL LOW (ref >90)
GLUCOSE: 111 mg/dL — AB (ref 70–99)
Potassium: 4.5 mEq/L (ref 3.7–5.3)
Sodium: 128 mEq/L — ABNORMAL LOW (ref 137–147)

## 2014-02-14 MED ORDER — DSS 100 MG PO CAPS: 100.0000 mg | ORAL_CAPSULE | Freq: Every day | ORAL | Status: DC | PRN

## 2014-02-14 MED ORDER — ZOLPIDEM TARTRATE ER 12.5 MG PO TBCR: 12.5000 mg | EXTENDED_RELEASE_TABLET | Freq: Every evening | ORAL | Status: DC | PRN

## 2014-02-14 MED ORDER — POLYETHYLENE GLYCOL 3350 17 G PO PACK
17.0000 g | PACK | Freq: Once | ORAL | Status: AC
  Administered 2014-02-14: 17 g via ORAL
  Filled 2014-02-14 (×2): qty 1

## 2014-02-14 MED ORDER — LORAZEPAM 1 MG PO TABS: 0.5000 mg | ORAL_TABLET | Freq: Every day | ORAL | Status: DC | PRN

## 2014-02-14 MED ORDER — TRAMADOL HCL 50 MG PO TABS
50.0000 mg | ORAL_TABLET | Freq: Four times a day (QID) | ORAL | Status: DC | PRN
Start: 2014-02-14 — End: 2014-11-19

## 2014-02-14 NOTE — Progress Notes (Signed)
Clinical social worker assisted with patient discharge to skilled nursing facility, [facility].  CSW addressed all family questions and concerns. CSW copied chart and added all important documents. CSW also set up patient transportation with [facility]. Clinical Social Worker will sign off for now as social work intervention is no longer needed.   Astoria Condon, MSW, LCSWA [phone] 

## 2014-02-14 NOTE — Care Management Note (Signed)
CARE MANAGEMENT NOTE 02/14/2014  Patient:  Jenny Torres, Jenny Torres   Account Number:  [id number]  Date Initiated:  02/14/2014  Documentation initiated by:  Ricki Miller  Subjective/Objective Assessment:   80 yr old female admitted with right femur fracture s/p Right femur IM [place].     Action/Plan:   Patient is for shortterm rehab at [facility]. Will go to either [place] or [facility] SNF.   Anticipated DC Date:  02/14/2014   Anticipated DC Plan:  SKILLED NURSING FACILITY  In-house referral  Clinical Social Worker         Choice offered to / List presented to:             Status of service:  Completed, signed off Medicare Important Message given?  NA - LOS <3 / Initial given by admissions (If response is "NO", the following Medicare IM given date fields will be blank) Date Medicare IM given:   Date Additional Medicare IM given:    Discharge Disposition:  [place]

## 2014-02-14 NOTE — Progress Notes (Signed)
Foley placed due to urinary retention and a blood transfusion being admin. Pt I/O cathed times 2 prior to foley being placed. Order for  Foley received from Dr Eliseo Squires.

## 2014-02-14 NOTE — Discharge Summary (Signed)
Physician Discharge Summary  Jenny Torres [id number] DOB: Jun 11, 1928 DOA: 02/11/2014  PCP: Darlin Coco, MD  Admit date: 02/11/2014 Discharge date: 02/14/2014  Recommendations for Outpatient Follow-up:  Please continue to monitor sodium level every week. It has chronically been in 128- 131 range. Dr. Mare Ferrari of cardio was also following sodium level in office.  Patient has received 2 units PRBC blood transfusion during this hospital stay. Hemoglobin is 8.4 after transfusion.    Discharge Diagnoses:  Principal Problem:   Femur fracture, right Active Problems:   HYPERTENSION   COPD   Raynaud's disease /phenomenon   Idiopathic peripheral neuropathy   Intertrochanteric fracture of right hip   Protein-calorie malnutrition, severe    Discharge Condition: stable   Diet recommendation: as tolerated   History of present illness:  80 year old female with past medical history of anxiety and depression, COPD, hypertension, GERD, neuropathy, carotid artery occlusion who presented to [facility] ED 02/11/2014 status post fall at home. No loss of consciousness. In ED, CT head and cervical spine showed chronic changes but no acute intracranial abnormalities. X-ray of right hip showed mildly comminuted intertrochanteric right femur fracture with mild valgus angulation. She underwent open treatment of right hip intertrochanteric fracture with intramedullary nailing.   Assessment/Plan:   Principal Problem:  Right femur fracture  - status post open treatment of right hip intertrochanteric fracture with intramedullary nailing  - appreciate ortho following and their recommendations  Active Problems:  Acute postoperative blood loss anemia  - Hemoglobin was 6.5 and status post 2 units PRBC transfusion it was 8.4  Leukocytosis  - CXR with no acute findings, afebrile  - no evidence of UTI on UA  Hyponatremia  - chronic - sodium ranges 128-131  Hypertension  - continue metoprolol 12.5 mg PO BID   Peripheral neuropathy  - Continue neurontin  Nausea and vomiting  - resolved  COPD  - stable  - quit smoking 30 years ago  Raynaud's disease  - stable  Severe protein calorie malnutrition  - diet as tolerated   Code Status: DNR  Family Communication: family not at the bedside this am   Consultants:  ortho Procedures:  ORIF   Signed:  Robbie Lis, MD  [facility] 02/14/2014, 9:43 AM  Pager #: [phone]   Discharge Exam: Filed Vitals:   02/14/14 0800  BP:   Pulse:   Temp:   Resp: 18   Filed Vitals:   02/14/14 0000 02/14/14 0400 02/14/14 0515 02/14/14 0800  BP:   110/56   Pulse:   108   Temp:   98.6 F (37 C)   TempSrc:   Oral   Resp: 17 16 16 18   Height:      Weight:      SpO2:   98%     General: Pt is alert, follows commands appropriately, not in acute distress Cardiovascular: Regular rate and rhythm, S1/S2 appreciated  Respiratory: Clear to auscultation bilaterally, no wheezing Abdominal: Soft, non tender, non distended, bowel sounds +, no guarding Extremities: no edema, no cyanosis, pulses palpable bilaterally DP and PT Neuro: Grossly nonfocal  Discharge Instructions  Discharge Instructions   Call MD for:  difficulty breathing, headache or visual disturbances    Complete by:  As directed      Call MD for:  persistant dizziness or light-headedness    Complete by:  As directed      Call MD for:  persistant nausea and vomiting    Complete by:  As directed  Call MD for:  severe uncontrolled pain    Complete by:  As directed      Diet - low sodium heart healthy    Complete by:  As directed      Discharge instructions    Complete by:  As directed   Please continue to monitor sodium level every week. It has chronically been in 128- 131 range. Dr. Mare Ferrari of cardio was also following sodium level in office.  Patient has received 2 units PRBC blood transfusion during this hospital stay. Hemoglobin is 8.4 after transfusion.      Increase activity slowly    Complete by:  As directed      Weight bearing as tolerated    Complete by:  As directed   Laterality:  right  Extremity:  Lower            Medication List         acetaminophen 325 MG tablet  Commonly known as:  TYLENOL  Take 2 tablets (650 mg total) by mouth every 6 (six) hours as needed for mild pain (or Fever >/= 101).     albuterol 108 (90 BASE) MCG/ACT inhaler  Commonly known as:  PROVENTIL HFA;VENTOLIN HFA  Inhale 1-2 puffs into the lungs every 6 (six) hours as needed for wheezing or shortness of breath.     aspirin 81 MG tablet  Take 81 mg by mouth daily.     BESIVANCE 0.6 % Susp  Generic drug:  Besifloxacin HCl  Apply 1 drop to eye 2 (two) times daily. Both eyes     DSS 100 MG Caps  Take 100 mg by mouth daily as needed for mild constipation.     DUREZOL 0.05 % Emul  Generic drug:  Difluprednate  Place 1 drop into the right eye 2 (two) times daily.     enoxaparin 30 MG/0.3ML injection  Commonly known as:  LOVENOX  Inject 0.3 mLs (30 mg total) into the skin daily.     fluocinonide cream 0.05 %  Commonly known as:  LIDEX  Apply topically 2 (two) times daily. As directed by dermatologist     gabapentin 800 MG tablet  Commonly known as:  NEURONTIN  Take 800 mg by mouth 3 (three) times daily.     LORazepam 1 MG tablet  Commonly known as:  ATIVAN  Take 0.5-1 tablets (0.5-1 mg total) by mouth daily as needed for anxiety.     metoprolol tartrate 25 MG tablet  Commonly known as:  LOPRESSOR  Take 12.5 mg by mouth 2 (two) times daily.     MULTIVITAL tablet  Take 1 tablet by mouth daily.     nepafenac 0.1 % ophthalmic suspension  Commonly known as:  [place] 1 drop into both eyes 3 (three) times daily.     ondansetron 8 MG disintegrating tablet  Commonly known as:  ZOFRAN-ODT  Take 8 mg by mouth daily as needed for nausea or vomiting.     OVER THE COUNTER MEDICATION  Place 1 drop into the nose as needed (for congestion).  Over the counter nasal spray     OVER THE COUNTER MEDICATION  Place 1 application into both eyes daily as needed. Use PRN to clean eyes before applying eye drops.. Name of medication is Systane lid wipes (Eyelid cleaning Wipes)     traMADol 50 MG tablet  Commonly known as:  ULTRAM  Take 1-2 tablets (50-100 mg total) by mouth every 6 (six) hours as needed for  moderate pain.     Vitamin D3 1000 UNITS Caps  Take 1 capsule by mouth.     zolpidem 12.5 MG CR tablet  Commonly known as:  AMBIEN CR  Take 1 tablet (12.5 mg total) by mouth at bedtime as needed for sleep.           Follow-up Information   Follow up with HEWITT, Jenny Reichmann, MD. Schedule an appointment as soon as possible for a visit in 2 weeks.   Specialty:  Orthopedic Surgery   Contact information:   [address] [place] [address] [place] [address] [phone]       Follow up with Darlin Coco, MD. Schedule an appointment as soon as possible for a visit in 2 weeks.   Specialty:  Cardiology   Contact information:   [address] [place] [address] [phone]        The results of significant diagnostics from this hospitalization (including imaging, microbiology, ancillary and laboratory) are listed below for reference.    Significant Diagnostic Studies: Dg Pelvis 1-2 Views  02/11/2014   CLINICAL DATA:  Right hip pain after fall.  EXAM: PELVIS - 1-2 VIEW  COMPARISON:  None.  FINDINGS: Severe degenerative joint disease is seen involving both hip joints. Diffuse osteopenia is noted. Moderately displaced fracture is seen involving the trochanteric region of proximal right femur.  IMPRESSION: Moderately displaced intertrochanteric fracture of the proximal right femur. Severe degenerative joint disease is seen involving both hip joints.   Electronically Signed   By: Sabino Dick M.D.   On: 02/11/2014 14:37   Dg Hip Operative Right  02/12/2014   CLINICAL DATA:  Internal fixation of right hip fracture.   EXAM: DG OPERATIVE RIGHT HIP  TECHNIQUE: A single spot fluoroscopic AP image of the right hip is submitted.  COMPARISON:  Right femur radiographs performed earlier today at 1:48 p.m.  FINDINGS: Four fluoroscopic C-arm images are provided from the OR. These demonstrate successful placement of an intramedullary rod and screws through the right femur, transfixing the patient's intertrochanteric fracture in grossly anatomic alignment. A mildly displaced lesser trochanteric fragment is again seen. No new fractures are identified.  IMPRESSION: Status post internal fixation of right femoral fracture in grossly anatomic alignment.   Electronically Signed   By: Garald Balding M.D.   On: 02/12/2014 05:17   Dg Femur Right  02/11/2014   CLINICAL DATA:  Right hip injury and pain.  EXAM: RIGHT FEMUR - 2 VIEW  COMPARISON:  None.  FINDINGS: A mildly comminuted intratrochanteric fracture of the proximal right femur is noted with mild valgus angulation.  Diffuse osteopenia is present.  Severe degenerative changes in the right hip are noted.  IMPRESSION: Mildly comminuted intertrochanteric right femur fracture with mild valgus angulation.   Electronically Signed   By: Hassan Rowan M.D.   On: 02/11/2014 14:36   Ct Head Wo Contrast  02/11/2014   CLINICAL DATA:  Status post fall.  Pain.  EXAM: CT HEAD WITHOUT CONTRAST  CT CERVICAL SPINE WITHOUT CONTRAST  TECHNIQUE: Multidetector CT imaging of the head and cervical spine was performed following the standard protocol without intravenous contrast. Multiplanar CT image reconstructions of the cervical spine were also generated.  COMPARISON:  03/28/2006  FINDINGS: CT HEAD FINDINGS  Ventricles are normal in configuration. There is ventricular and sulcal enlargement reflecting mild atrophy. No parenchymal masses or mass effect. There is no evidence of a recent infarct. Patchy white matter hypoattenuation is noted most consistent with moderate  chronic microvascular ischemic change.  No  extra-axial masses or abnormal fluid collections.  There is no intracranial hemorrhage.  The visualized sinuses and mastoid air cells are clear. No skull fracture.  CT CERVICAL SPINE FINDINGS  There are well-defined lucent lines crossing the pedicles of C4, with a subtle oblique lucent line crossing the right lamina at C4. Although these could potentially be acute fractures a have a chronic appearance. There is no associated soft tissue edema or hemorrhage.  No other evidence of a fracture.  No spondylolisthesis. There is reversal of the normal cervical lordosis centered at C5. There is moderate loss of disk height at C5-C6 and C6-C7 with mild loss of disk height at C4-C5. There are prominent endplate osteophytes along the mid and lower cervical spine. Facet degenerative changes also noted bilaterally, greatest on the right at C2-C3 and on the left at C2-C3 and C3-C4. The bones are demineralized.  The soft tissues are unremarkable.  The lung apices show scarring, but no acute findings.  IMPRESSION: HEAD CT:  No acute intracranial abnormality.  No skull fracture.  CERVICAL CT: Defects are noted across the pedicles at C4 as well as the right C4 lamina. Although these could potentially reflect acute fractures, and a chronic appearance and potentially could be developmental. If the patient is a reliable historian, and does not have significant neck pain, the should be considered chronic or developmental. If there is neck pain or neck pain cannot be ascertained, consider followup cervical MRI.  No other evidence of a fracture.  Advanced degenerative changes.   Electronically Signed   By: Lajean Manes M.D.   On: 02/11/2014 14:23   Ct Cervical Spine Wo Contrast  02/11/2014   CLINICAL DATA:  Status post fall.  Pain.  EXAM: CT HEAD WITHOUT CONTRAST  CT CERVICAL SPINE WITHOUT CONTRAST  TECHNIQUE: Multidetector CT imaging of the head and cervical spine was performed following the standard protocol without intravenous  contrast. Multiplanar CT image reconstructions of the cervical spine were also generated.  COMPARISON:  03/28/2006  FINDINGS: CT HEAD FINDINGS  Ventricles are normal in configuration. There is ventricular and sulcal enlargement reflecting mild atrophy. No parenchymal masses or mass effect. There is no evidence of a recent infarct. Patchy white matter hypoattenuation is noted most consistent with moderate chronic microvascular ischemic change.  No extra-axial masses or abnormal fluid collections.  There is no intracranial hemorrhage.  The visualized sinuses and mastoid air cells are clear. No skull fracture.  CT CERVICAL SPINE FINDINGS  There are well-defined lucent lines crossing the pedicles of C4, with a subtle oblique lucent line crossing the right lamina at C4. Although these could potentially be acute fractures a have a chronic appearance. There is no associated soft tissue edema or hemorrhage.  No other evidence of a fracture.  No spondylolisthesis. There is reversal of the normal cervical lordosis centered at C5. There is moderate loss of disk height at C5-C6 and C6-C7 with mild loss of disk height at C4-C5. There are prominent endplate osteophytes along the mid and lower cervical spine. Facet degenerative changes also noted bilaterally, greatest on the right at C2-C3 and on the left at C2-C3 and C3-C4. The bones are demineralized.  The soft tissues are unremarkable.  The lung apices show scarring, but no acute findings.  IMPRESSION: HEAD CT:  No acute intracranial abnormality.  No skull fracture.  CERVICAL CT: Defects are noted across the pedicles at C4 as well as the right C4 lamina. Although these could  potentially reflect acute fractures, and a chronic appearance and potentially could be developmental. If the patient is a reliable historian, and does not have significant neck pain, the should be considered chronic or developmental. If there is neck pain or neck pain cannot be ascertained, consider  followup cervical MRI.  No other evidence of a fracture.  Advanced degenerative changes.   Electronically Signed   By: Lajean Manes M.D.   On: 02/11/2014 14:23   Dg Chest Port 1 View  02/12/2014   CLINICAL DATA:  Leukocytosis.  EXAM: PORTABLE CHEST - 1 VIEW  COMPARISON:  Radiographs 11/01/2013.  CT 10/06/2012.  FINDINGS: The heart size and mediastinal contours are stable. There is stable bilateral superior hilar retraction. Biapical pleural parenchymal scarring, lesser nodularity at the lung bases and calcified right hilar/mediastinal lymph nodes are grossly unchanged. No superimposed airspace disease or significant pleural effusion is seen.  IMPRESSION: Stable chronic lung disease most consistent with indolent infection as correlated with prior CT. No acute superimposed findings identified.   Electronically Signed   By: Camie Patience M.D.   On: 02/12/2014 11:28    Microbiology: No results found for this or any previous visit (from the past 240 hour(s)).   Labs: Basic Metabolic Panel:  Recent Labs Lab 02/11/14 1325 02/12/14 0420 02/13/14 0918 02/14/14 0510  NA 128* 129* 131* 128*  K 3.7 3.7 4.1 4.5  CL 88* 95* 97 93*  CO2 25 20 25 25   GLUCOSE 107* 137* 140* 111*  BUN 10 11 17 17   CREATININE 0.49* 0.47* 0.57 0.53  CALCIUM 9.6 8.4 8.4 8.0*   Liver Function Tests: No results found for this basename: AST, ALT, ALKPHOS, BILITOT, PROT, ALBUMIN,  in the last 168 hours No results found for this basename: LIPASE, AMYLASE,  in the last 168 hours No results found for this basename: AMMONIA,  in the last 168 hours CBC:  Recent Labs Lab 02/11/14 1325 02/12/14 0420 02/12/14 0935 02/13/14 0918 02/14/14 0510  WBC 7.0 12.0* 16.4* 11.3* 12.6*  NEUTROABS 5.0  --   --   --   --   HGB 14.1 8.8* 7.8* 6.5* 8.4*  HCT 39.2 25.1* 22.0* 18.6* 23.7*  MCV 85.8 87.8 86.6 89.0 85.6  PLT 208 SPECIMEN CLOTTED 161 151 PLATELET CLUMPS NOTED ON SMEAR, COUNT APPEARS DECREASED   Cardiac Enzymes: No results  found for this basename: CKTOTAL, CKMB, CKMBINDEX, TROPONINI,  in the last 168 hours BNP: BNP (last 3 results) No results found for this basename: PROBNP,  in the last 8760 hours CBG: No results found for this basename: GLUCAP,  in the last 168 hours  Time coordinating discharge: Over 30 minutes

## 2014-02-14 NOTE — Progress Notes (Signed)
Subjective: 3 Days Post-Op Procedure(s) (LRB): INTRAMEDULLARY (IM) NAIL INTERTROCHANTRIC (Right) Patient reports pain as mild.  No c/o.  Transfused yesterday.  Awaiting snf placement.  Objective: Vital signs in last 24 hours: Temp:  [98.6 F (37 C)-99.4 F (37.4 C)] 98.6 F (37 C) (05/27 0515) Pulse Rate:  [108-122] 108 (05/27 0515) Resp:  [16-18] 18 (05/27 1200) BP: (110-159)/(56-76) 110/56 mmHg (05/27 0515) SpO2:  [94 %-98 %] 98 % (05/27 0515)  Intake/Output from previous day: 05/26 0701 - 05/27 0700 In: 1080 [P.O.:690; Blood:390] Out: 600 [Urine:600] Intake/Output this shift: Total I/O In: 180 [P.O.:180] Out: -    Recent Labs  02/11/14 1325 02/12/14 0420 02/12/14 0935 02/13/14 0918 02/14/14 0510  HGB 14.1 8.8* 7.8* 6.5* 8.4*    Recent Labs  02/13/14 0918 02/14/14 0510  WBC 11.3* 12.6*  RBC 2.09* 2.77*  HCT 18.6* 23.7*  PLT 151 PLATELET CLUMPS NOTED ON SMEAR, COUNT APPEARS DECREASED    Recent Labs  02/13/14 0918 02/14/14 0510  NA 131* 128*  K 4.1 4.5  CL 97 93*  CO2 25 25  BUN 17 17  CREATININE 0.57 0.53  GLUCOSE 140* 111*  CALCIUM 8.4 8.0*    Recent Labs  02/11/14 1325  INR 0.99    PE:  R hip wound dressed and dry.  NVI at RLE.  Assessment/Plan: 3 Days Post-Op Procedure(s) (LRB): INTRAMEDULLARY (IM) NAIL INTERTROCHANTRIC (Right) Discharge to SNF  WBAT.  Lovenox for dvt prophylaxis.  Continue PT.  Tramadol for pain.  Epic orders updated yesterday.  [name] 02/14/2014, 1:07 PM

## 2014-02-14 NOTE — Clinical Social Work Psychosocial (Addendum)
Clinical Social Work Department  BRIEF PSYCHOSOCIAL ASSESSMENT  Patient:Amberlea GLADYCE MCRAY Account Number: [id number]   Admit date: 02/11/14 Clinical Social Worker Rhea Pink, MSW Date/Time: 02/14/2014 12:30 PM Referred by: Physician Date Referred:  Referred for   SNF Placement   Other Referral:  Interview type: Patient's daughter over the phone- Patient's daughter requested to [place] with SW after 12:00 PM per note on chart.CSQ retains copy Other interview type: PSYCHOSOCIAL DATA  Living Status: Alone Admitted from facility:  Level of care:  Primary support name: [name] Primary support relationship to patient: Daughter Degree of support available:  Strong and vested  CURRENT CONCERNS  Current Concerns   Post-Acute Placement   Other Concerns:  SOCIAL WORK ASSESSMENT / PLAN  CSW met withpat's daughter ontacted patient's daughter post 12:00 PM to inform pt's daughter that patietn was beng dicharged today per MD. PAtient's daughter became [facility] angry and was unclear how thsi was the first she was hearing about dischrage. CSW attempted to talk to patietn about d/c plans to [facility] but patient's daughter demanded to speak to MD and RN. CSW paiged MD to make aware of Daughter's request. Pt's daughter did agree to be faxed out for SNF placement,  Patient's daughter remained upeset throughout the conversation and maintained that there were still medical questions to be answered along with a discussion with her mom about rehab.  re: PT recommendation for SNF.   Pt lives with   CSW explained placement process and answered questions.   Pt's daughter  Reported that her choices wpuld be [facility] [employer] or [facility]  as her preference    CSW completed FL2 and initiated SNF search.     Assessment/plan status: Information/Referral to [employer]  Other assessment/ plan:  Information/referral to community resources:  SNF   PTAR  PATIENT'S/FAMILY'S RESPONSE TO PLAN OF CARE:  Pt's  daughter repported to to hospital and demanded to speak with AD of SW, CM ad MD. All met with patient's daughter. She was very tearful and asked many questions to MD regarding the patient's weight. Patient's daughter also reiterated this to AD of CM and SW. She also explained how stubborn her mother would be about rehab. CSW, AD of CS and CM met with patient and daughter at bedside and discussed rehab. Patient was surprised by discharge but agreed to [facility] at [place] with family support. Patient reports she is agreeable to ST SNF in order to increase strength and independence with mobility prior to returning home  Pt verbalized understanding of placement process and appreciation for CSW assist.   Rhea Pink, MSW [place]

## 2014-02-14 NOTE — Progress Notes (Signed)
Physical Therapy Treatment Patient Details Name: TAILEY TOP MRN: [id number] DOB: [birth year] Today's Date: 02/14/2014    History of Present Illness S/P right IM nail R hip due to fall. Also hit head in fall    PT Comments    Patient eager to get out of bed to the recliner this morning. Making some progress with standing and stepping but still limited by pain and weakness. Continue to recommend SNF for ongoing Physical Therapy.      Follow Up Recommendations  SNF;Supervision for mobility/OOB     Equipment Recommendations  None recommended by PT    Recommendations for Other Services       Precautions / Restrictions Precautions Precautions: Fall Restrictions RLE Weight Bearing: Weight bearing as tolerated    Mobility  Bed Mobility   Bed Mobility: Supine to Sit     Supine to sit: Mod assist;HOB elevated     General bed mobility comments: A for R LE out of bed and for trunk support into sitting  Transfers Overall transfer level: Needs assistance Equipment used: Rolling walker (2 wheeled)   Sit to Stand: Mod assist;+2 safety/equipment Stand pivot transfers: +2 physical assistance;Mod assist       General transfer comment: Patient able to stand with use of RW but unable to use with SPT. Cues for posture and for steppage and best sequency.   Ambulation/Gait                 Stairs            Wheelchair Mobility    Modified Rankin (Stroke Patients Only)       Balance     Sitting balance-Leahy Scale: Poor                              Cognition Arousal/Alertness: Awake/alert Behavior During Therapy: WFL for tasks assessed/performed Overall Cognitive Status: Within Functional Limits for tasks assessed                      Exercises General Exercises - Lower Extremity Long Arc Quad: AAROM;Right;10 reps Heel Slides: AAROM;Right;10 reps Hip ABduction/ADduction: AAROM;Right;10 reps    General Comments         Pertinent Vitals/Pain Grimaced with attempts of ambulation but stated felt no pain once sitting and reclined    Home Living                      Prior Function            PT Goals (current goals can now be found in the care plan section) Progress towards PT goals: Progressing toward goals    Frequency  Min 5X/week    PT Plan Current plan remains appropriate    Co-evaluation             End of Session Equipment Utilized During Treatment: Gait belt Activity Tolerance: Patient tolerated treatment well Patient left: in chair;with call bell/phone within reach     Time: 0953-1018 PT Time Calculation (min): 25 min  Charges:  $Therapeutic Activity: 23-37 mins                    G Codes:      Tonia Brooms Gwendelyn Lanting 02/14/2014, 11:59 AM 02/14/2014 [place] PTA [phone] pager [phone] office

## 2014-02-14 NOTE — Progress Notes (Addendum)
Patient ID: Jenny Torres, female   DOB: Aug 20, 1928, 80 y.o.   MRN: [id number] [facility] PROGRESS NOTE  Jenny Torres ELF:[id number] DOB: [birth year] DOA: 02/11/2014 PCP: Darlin Coco, MD  Brief narrative: 80 year old female with past medical history of anxiety and depression, COPD, hypertension, GERD, neuropathy, carotid artery occlusion who presented to [facility] ED 02/11/2014 status post fall at home. No loss of consciousness. In ED, CT head and cervical spine showed chronic changes but no acute intracranial abnormalities. X-ray of right hip showed mildly comminuted intertrochanteric right femur fracture with mild valgus angulation. She underwent open treatment of right hip intertrochanteric fracture with intramedullary nailing.   Assessment/Plan:   Principal Problem: Right femur fracture  - status post open treatment of right hip intertrochanteric fracture with intramedullary nailing - needs SNF on discharge - appreciate ortho following Active Problems: Acute postoperative  blood loss anemia - Hemoglobin was 6.5 and status post 2 units PRBC transfusion it was 8.4  Leukocytosis - CXR with no acute findings, afebrile  - no evidence of UTI on UA Hyponatremia - likely dehydration - sodium ranges 128-131 - may continue IV fluids for next 24 hours - repeat BMP in am Hypertension  - continue metoprolol 12.5 mg PO BID Peripheral neuropathy  - Continue neurontin  Nausea and vomiting  - resolved   COPD  - stable - quit smoking 30 years ago Raynaud's disease  - stable  Severe protein calorie malnutrition  - diet as tolerated   Code Status: DNR  Family Communication: family not at the bedside this am Disposition Plan: to SNF when stable   Consultants:  ortho Procedures:  ORIF  Robbie Lis, MD  [facility] Pager [phone]  If 7PM-7AM, please contact night-coverage [email or web address] Password [facility] 02/14/2014, 7:49 AM   LOS: 3 days    HPI/Subjective: No  overnight events.   Objective: Filed Vitals:   02/13/14 2101 02/14/14 0000 02/14/14 0400 02/14/14 0515  BP: 152/70   110/56  Pulse: 122   108  Temp: 99.4 F (37.4 C)   98.6 F (37 C)  TempSrc: Oral   Oral  Resp: 18 17 16 16   Height:      Weight:      SpO2: 97%   98%    Intake/Output Summary (Last 24 hours) at 02/14/14 0749 Last data filed at 02/14/14 0518  Gross per 24 hour  Intake   1080 ml  Output    600 ml  Net    480 ml    Exam:   General:  Pt is not in acute distress  Cardiovascular: tachycardic, S1/S2 appreciated   Respiratory: Clear to auscultation bilaterally, no wheezing  Abdomen: Soft, non tender, non distended, bowel sounds present  Extremities: No edema, pulses DP and PT palpable bilaterally  Neuro: Grossly nonfocal  Data Reviewed: Basic Metabolic Panel:  Recent Labs Lab 02/11/14 1325 02/12/14 0420 02/13/14 0918 02/14/14 0510  NA 128* 129* 131* 128*  K 3.7 3.7 4.1 4.5  CL 88* 95* 97 93*  CO2 25 20 25 25   GLUCOSE 107* 137* 140* 111*  BUN 10 11 17 17   CREATININE 0.49* 0.47* 0.57 0.53  CALCIUM 9.6 8.4 8.4 8.0*   Liver Function Tests: No results found for this basename: AST, ALT, ALKPHOS, BILITOT, PROT, ALBUMIN,  in the last 168 hours No results found for this basename: LIPASE, AMYLASE,  in the last 168 hours No results found for this basename: AMMONIA,  in the last 168 hours CBC:  Recent Labs  Lab 02/11/14 1325 02/12/14 0420 02/12/14 0935 02/13/14 0918 02/14/14 0510  WBC 7.0 12.0* 16.4* 11.3* 12.6*  NEUTROABS 5.0  --   --   --   --   HGB 14.1 8.8* 7.8* 6.5* 8.4*  HCT 39.2 25.1* 22.0* 18.6* 23.7*  MCV 85.8 87.8 86.6 89.0 85.6  PLT 208 SPECIMEN CLOTTED 161 151 PENDING   Cardiac Enzymes: No results found for this basename: CKTOTAL, CKMB, CKMBINDEX, TROPONINI,  in the last 168 hours BNP: No components found with this basename: POCBNP,  CBG: No results found for this basename: GLUCAP,  in the last 168 hours  No results found for  this or any previous visit (from the past 240 hour(s)).   Studies: Dg Chest Port 1 View 02/12/2014    IMPRESSION: Stable chronic lung disease most consistent with indolent infection as correlated with prior CT. No acute superimposed findings identified.     Scheduled Meds: . docusate sodium  100 mg Oral BID  . enoxaparin (LOVENOX)   30 mg Subcutaneous Q24H  . feeding supplement   1 Container Oral BID BM  . gabapentin  800 mg Oral TID  . metoCLOPramide  5 mg Intravenous 4 times per day  . metoprolol tartrate  12.5 mg Oral BID  . senna  1 tablet Oral BID

## 2014-02-14 NOTE — Clinical Social Work Placement (Signed)
[facility] WORK PLACEMENT NOTE   Patient: JEANELL MANGAN  Account Number: [id number]   [place] date:  02/11/14 Clinical Social Worker: Rhea Pink LCSWA Date/time: 02/14/2014 10:30 AM  Clinical Social Work is seeking post-discharge placement for this patient at the following level of care: SKILLED NURSING (*CSW will update this form in Epic as items are completed)  02/14/2014 Patient/family provided with [place] Department of Clinical Social Work's list of facilities offering this level of care within the geographic area requested by the patient (or if unable, by the patient's family).  02/14/2014 Patient/family informed of their freedom to choose among providers that offer the needed level of care, that participate in Medicare, Medicaid or managed care program needed by the patient, have an available bed and are willing to accept the patient.  02/14/2014 Patient/family informed of [facility]' ownership interest in [facility], as well as of the fact that they are under no obligation to receive care at this facility.  PASARR submitted to EDS on 02/14/2014  PASARR number received from EDS on 02/14/2014  FL2 transmitted to all facilities in geographic area requested by pt/family on 02/14/2014 FL2 transmitted to all facilities within larger geographic area on  Patient informed that his/her managed care company has contracts with or will negotiate with certain facilities, including the following:  Patient/family informed of bed offers received:  Patient chooses bed at  Physician recommends and patient chooses bed at  Patient to be transferred to on  Patient to be transferred to facility by  The following physician request were entered in Epic:  Additional Comments:

## 2014-02-14 NOTE — Progress Notes (Signed)
Occupational Therapy Treatment Patient Details Name: LAURENA VALKO MRN: [id number] DOB: Mar 17, 1928 Today's Date: 02/14/2014    History of present illness S/P right IM nail R hip due to fall. Also hit head in fall   OT comments  Focus of session on seated grooming and static standing in preparation for standing activities and ADL transfers.  Pt motivated and cooperative.  Intermittent buckling noted with standing, but able to tolerate x 2 minutes with weight shifting. Continues to require +2 assist, SNF remains optimal d/c location.  Follow Up Recommendations  SNF    Equipment Recommendations       Recommendations for Other Services      Precautions / Restrictions Precautions Precautions: Fall Restrictions RLE Weight Bearing: Weight bearing as tolerated       Mobility Bed Mobility   Bed Mobility: Supine to Sit     Supine to sit: Mod assist;HOB elevated     General bed mobility comments: A for R LE out of bed and for trunk support into sitting  Transfers Overall transfer level: Needs assistance Equipment used: Rolling walker (2 wheeled) Transfers: Sit to/from Stand Sit to Stand: +2 physical assistance;Mod assist Stand pivot transfers: +2 physical assistance;Mod assist       General transfer comment: Able to stand with RW x 2 minutes, but with some buckling of R LE. Unable to clear foot to take step.    Balance     Sitting balance-Leahy Scale: Poor     Standing balance support: Bilateral upper extremity supported (with weight shift) Standing balance-Leahy Scale: Poor                     ADL Overall ADL's : Needs assistance/impaired     Grooming: Oral care;Sitting;Set up                               Functional mobility during ADLs: +2 for physical assistance;Rolling walker;Moderate assistance General ADL Comments: Practiced sit to stand and static standing with support of walker as a precursor to standing ADL.      Vision                      Perception     Praxis      Cognition   Behavior During Therapy: WFL for tasks assessed/performed Overall Cognitive Status: Within Functional Limits for tasks assessed                       Extremity/Trunk Assessment               Exercises   Shoulder Instructions       General Comments      Pertinent Vitals/ Pain       No pain reported  Home Living                                          Prior Functioning/Environment              Frequency Min 2X/week     Progress Toward Goals  OT Goals(current goals can now be found in the care plan section)  Progress towards OT goals: Progressing toward goals  Acute Rehab OT Goals Patient Stated Goal: to return home and to get stronger  Plan Discharge plan remains appropriate  Co-evaluation                 End of Session Equipment Utilized During Treatment: Gait belt;Rolling walker   Activity Tolerance Patient tolerated treatment well   Patient Left in chair;with call bell/phone within reach   Nurse Communication          Time: 1423-1450 OT Time Calculation (min): 27 min  Charges: OT General Charges $OT Visit: 1 Procedure OT Treatments $Self Care/Home Management : 23-37 mins  Haze Boyden Suetta Hoffmeister 02/14/2014, 2:59 PM [phone]

## 2014-02-18 ENCOUNTER — Non-Acute Institutional Stay (SKILLED_NURSING_FACILITY): Payer: Medicare Other | Admitting: Adult Health

## 2014-02-18 ENCOUNTER — Encounter: Payer: Self-pay | Admitting: Adult Health

## 2014-02-18 DIAGNOSIS — G4700 Insomnia, unspecified: Secondary | ICD-10-CM | POA: Insufficient documentation

## 2014-02-18 DIAGNOSIS — S7291XA Unspecified fracture of right femur, initial encounter for closed fracture: Secondary | ICD-10-CM

## 2014-02-18 DIAGNOSIS — D62 Acute posthemorrhagic anemia: Secondary | ICD-10-CM | POA: Insufficient documentation

## 2014-02-18 DIAGNOSIS — J449 Chronic obstructive pulmonary disease, unspecified: Secondary | ICD-10-CM

## 2014-02-18 DIAGNOSIS — G609 Hereditary and idiopathic neuropathy, unspecified: Secondary | ICD-10-CM

## 2014-02-18 DIAGNOSIS — E43 Unspecified severe protein-calorie malnutrition: Secondary | ICD-10-CM

## 2014-02-18 DIAGNOSIS — I10 Essential (primary) hypertension: Secondary | ICD-10-CM

## 2014-02-18 DIAGNOSIS — S7290XA Unspecified fracture of unspecified femur, initial encounter for closed fracture: Secondary | ICD-10-CM

## 2014-02-18 NOTE — Progress Notes (Signed)
Patient ID: Jenny Torres, female   DOB: 25-Mar-1928, 80 y.o.   MRN: [id number]               PROGRESS NOTE  DATE: 02/18/2014  FACILITY: Nursing Home Location: [facility]  LEVEL OF CARE: SNF (31)  Acute Visit  CHIEF COMPLAINT:  Follow-up Hospitalization  HISTORY OF PRESENT ILLNESS: This is an 80 year old female who has been admitted to [facility] on 02/14/14 from [facility] with Right femur fracture S/P ORIF IM Nailing. She has been admitted for a short-term rehabilitation.  REASSESSMENT OF ONGOING PROBLEM(S):  HTN: Pt 's HTN remains stable.  Denies CP, sob, DOE, pedal edema, headaches, dizziness or visual disturbances.  No complications from the medications currently being used.  Last BP : 121/61  PERIPHERAL NEUROPATHY: The peripheral neuropathy is stable. The patient denies pain in the feet, tingling, and numbness. No complications noted from the medication presently being used.  ANEMIA: The anemia has been stable. The patient denies fatigue, melena or hematochezia. No complications from the medications currently being used. 5/15 hgb 8.4  PAST MEDICAL HISTORY : Reviewed.  No changes/see problem list  CURRENT MEDICATIONS: Reviewed per MAR/see medication list  REVIEW OF SYSTEMS:  GENERAL: no change in appetite, no fatigue, no weight changes, no fever, chills or weakness RESPIRATORY: no cough, SOB, DOE, wheezing, hemoptysis CARDIAC: no chest pain, edema or palpitations GI: no abdominal pain, diarrhea, constipation, heart burn, nausea or vomiting  PHYSICAL EXAMINATION  GENERAL: no acute distress, thin body habitus EYES: conjunctivae normal, sclerae normal, normal eye lids NECK: supple, trachea midline, no neck masses, no thyroid tenderness, no thyromegaly LYMPHATICS: no LAN in the neck, no supraclavicular LAN RESPIRATORY: breathing is even & unlabored, BS CTAB CARDIAC: RRR, no murmur,no extra heart sounds, no edema GI: abdomen soft, normal BS, no  masses, no tenderness, no hepatomegaly, no splenomegaly EXTREMITIES: able to move all 4 extremities but with limited ROM on RLE due to surgery/pain PSYCHIATRIC: the patient is alert & oriented to person, affect & behavior appropriate  LABS/RADIOLOGY: Labs reviewed: Basic Metabolic Panel:  Recent Labs  02/12/14 0420 02/13/14 0918 02/14/14 0510  NA 129* 131* 128*  K 3.7 4.1 4.5  CL 95* 97 93*  CO2 20 25 25   GLUCOSE 137* 140* 111*  BUN 11 17 17   CREATININE 0.47* 0.57 0.53  CALCIUM 8.4 8.4 8.0*   Liver Function Tests:  Recent Labs  04/28/13 1221 08/28/13 1244 01/15/14 1236  AST 29 32 32  ALT 17 18 19   ALKPHOS 82 85 95  BILITOT 1.0 1.0 0.8  PROT 6.9 6.7 6.8  ALBUMIN 4.2 4.0 4.1   CBC:  Recent Labs  02/11/14 1325  02/12/14 0935 02/13/14 0918 02/14/14 0510  WBC 7.0  < > 16.4* 11.3* 12.6*  NEUTROABS 5.0  --   --   --   --   HGB 14.1  < > 7.8* 6.5* 8.4*  HCT 39.2  < > 22.0* 18.6* 23.7*  MCV 85.8  < > 86.6 89.0 85.6  PLT 208  < > 161 151 PLATELET CLUMPS NOTED ON SMEAR, COUNT APPEARS DECREASED  < > = values in this interval not displayed.  Lipid Panel:  Recent Labs  04/28/13 1221 08/28/13 1244 01/15/14 1236  HDL 87.70 76.40 84.50    CLINICAL DATA:  Leukocytosis.   EXAM: PORTABLE CHEST - 1 VIEW   COMPARISON:  Radiographs 11/01/2013.  CT 10/06/2012.   FINDINGS: The heart size and mediastinal contours are  stable. There is stable bilateral superior hilar retraction. Biapical pleural parenchymal scarring, lesser nodularity at the lung bases and calcified right hilar/mediastinal lymph nodes are grossly unchanged. No superimposed airspace disease or significant pleural effusion is seen.   IMPRESSION: Stable chronic lung disease most consistent with indolent infection as correlated with prior CT. No acute superimposed findings identified. CLINICAL DATA:  Internal fixation of right hip fracture.   EXAM: DG OPERATIVE RIGHT HIP   TECHNIQUE: A single  spot fluoroscopic AP image of the right hip is submitted.   COMPARISON:  Right femur radiographs performed earlier today at 1:48 p.m.   FINDINGS: Four fluoroscopic C-arm images are provided from the OR. These demonstrate successful placement of an intramedullary rod and screws through the right femur, transfixing the patient's intertrochanteric fracture in grossly anatomic alignment. A mildly displaced lesser trochanteric fragment is again seen. No new fractures are identified.   IMPRESSION: Status post internal fixation of right femoral fracture in grossly anatomic alignment.    ASSESSMENT/PLAN:  Right femur fracture status post ORIF IM Nailing - for rehabilitation Anemia, acute blood loss status post 2 unit of PRBC transfusion - stable; check CBC Hypertension - well controlled; continue metoprolol Peripheral neuropathy - stable; continue Neurontin COPD - stable; continue Proventil when necessary Insomnia - continue Ambien when necessary Protein calorie malnutrition - continue supplementation Hyponatremia - check BMP   CPT CODE: 55974  Monina Vargas - NP [facility] [phone]

## 2014-02-19 ENCOUNTER — Encounter: Payer: Self-pay | Admitting: *Deleted

## 2014-02-20 ENCOUNTER — Encounter: Payer: Self-pay | Admitting: *Deleted

## 2014-02-20 ENCOUNTER — Non-Acute Institutional Stay (SKILLED_NURSING_FACILITY): Payer: Medicare Other | Admitting: Internal Medicine

## 2014-02-20 DIAGNOSIS — S7291XA Unspecified fracture of right femur, initial encounter for closed fracture: Secondary | ICD-10-CM

## 2014-02-20 DIAGNOSIS — S7290XA Unspecified fracture of unspecified femur, initial encounter for closed fracture: Secondary | ICD-10-CM

## 2014-02-20 DIAGNOSIS — J449 Chronic obstructive pulmonary disease, unspecified: Secondary | ICD-10-CM

## 2014-02-20 DIAGNOSIS — D62 Acute posthemorrhagic anemia: Secondary | ICD-10-CM

## 2014-02-20 DIAGNOSIS — K5900 Constipation, unspecified: Secondary | ICD-10-CM | POA: Insufficient documentation

## 2014-02-20 NOTE — Progress Notes (Signed)
HISTORY & PHYSICAL  DATE: 02/20/2014   FACILITY: [facility]  LEVEL OF CARE: SNF (31)  ALLERGIES:  Allergies  Allergen Reactions  . Sulfamethoxazole Swelling    Swelling of the throat, mouth, tongue, nose  . Ceftin     GI problems  . Darvocet [Propoxyphene N-Acetaminophen] Itching  . Erythromycin     Upset stomach  . Hydrocod Polst-Cpm Polst Er     nausea  . Lovastatin     unknown  . Paroxetine Hcl     jittery  . Penicillins     unknown  . Theophyllines   . Zovirax [Acyclovir]     Per patient lots of side effects    CHIEF COMPLAINT:  Manage right femur fracture, acute blood loss anemia and COPD  HISTORY OF PRESENT ILLNESS: Patient is an 80 year old Caucasian female.  HIP FRACTURE: The patient had a mechanical fall and sustained a femur fracture.  Patient subsequently underwent surgical repair and tolerated the procedure well. Patient is admitted to this facility for short-term rehabilitation. Patient denies hip pain currently. No complications reported from the pain medications currently being used.  ANEMIA: The anemia has been stable. The patient denies fatigue, melena or hematochezia. No complications from the medications currently being used. Postoperatively hemoglobin dropped to 6.5. She underwent transfusion of 2 units of packed red blood cells. Hemoglobin came to 8.4 afterwards.  COPD: the COPD remains stable.  Pt denies sob, cough, wheezing or declining exercise tolerance.  No complications from the medications presently being used.  PAST MEDICAL HISTORY :  Past Medical History  Diagnosis Date  . Anxiety   . Depression   . COPD (chronic obstructive pulmonary disease)   . HTN (hypertension)   . Gastritis   . Polyp of colon   . Hemorrhoids, external   . Diverticula, colon   . GERD (gastroesophageal reflux disease)   . Carotid artery occlusion   . Idiopathic peripheral neuropathy     chronic  . Hyperlipidemia   . MVP (mitral  valve prolapse)     stable  . Palpitations   . Osteoporosis   . Hyperlipidemia   . Raynaud's disease     PAST SURGICAL HISTORY: Past Surgical History  Procedure Laterality Date  . Cholecystectomy    . C-sections      x2  . Intramedullary (im) nail intertrochanteric Right 02/11/2014    Procedure: INTRAMEDULLARY (IM) NAIL INTERTROCHANTRIC;  Surgeon: [name], MD;  Location: [place];  Service: Orthopedics;  Laterality: Right;    SOCIAL HISTORY:  reports that she quit smoking about 30 years ago. Her smoking use included Cigarettes. She has a 25 pack-year smoking history. She has never used smokeless tobacco. She reports that she drinks alcohol. She reports that she does not use illicit drugs.  FAMILY HISTORY:  Family History  Problem Relation Age of Onset  . Breast cancer Mother   . Liver disease Brother   . Cirrhosis Brother     CURRENT MEDICATIONS: Reviewed per MAR/see medication list  REVIEW OF SYSTEMS:  GI: Complains of constipation, See HPI otherwise 14 point ROS is negative.  PHYSICAL EXAMINATION  VS:  See VS section  GENERAL: no acute distress, normal body habitus EYES: conjunctivae normal, sclerae normal, normal eye lids MOUTH/THROAT: lips without lesions,no lesions in the mouth,tongue is without lesions,uvula elevates in midline NECK: supple, trachea midline, no neck masses, no thyroid tenderness, no thyromegaly LYMPHATICS: no LAN in the neck, no supraclavicular LAN  RESPIRATORY: breathing is even & unlabored, BS CTAB CARDIAC: RRR, no murmur,no extra heart sounds, no edema GI:  ABDOMEN: abdomen soft, normal BS, no masses, no tenderness  LIVER/SPLEEN: no hepatomegaly, no splenomegaly MUSCULOSKELETAL: HEAD: normal to inspection  EXTREMITIES: LEFT UPPER EXTREMITY: full range of motion, normal strength & tone RIGHT UPPER EXTREMITY:  full range of motion, normal strength & tone LEFT LOWER EXTREMITY:  Moderate range of motion, normal strength & tone RIGHT LOWER  EXTREMITY:  range of motion not tested due to surgery, normal strength & tone PSYCHIATRIC: the patient is alert & oriented to person, affect & behavior appropriate  LABS/RADIOLOGY:  Labs reviewed: Basic Metabolic Panel:  Recent Labs  02/12/14 0420 02/13/14 0918 02/14/14 0510  NA 129* 131* 128*  K 3.7 4.1 4.5  CL 95* 97 93*  CO2 20 25 25   GLUCOSE 137* 140* 111*  BUN 11 17 17   CREATININE 0.47* 0.57 0.53  CALCIUM 8.4 8.4 8.0*   Liver Function Tests:  Recent Labs  04/28/13 1221 08/28/13 1244 01/15/14 1236  AST 29 32 32  ALT 17 18 19   ALKPHOS 82 85 95  BILITOT 1.0 1.0 0.8  PROT 6.9 6.7 6.8  ALBUMIN 4.2 4.0 4.1   CBC:  Recent Labs  02/11/14 1325  02/12/14 0935 02/13/14 0918 02/14/14 0510  WBC 7.0  < > 16.4* 11.3* 12.6*  NEUTROABS 5.0  --   --   --   --   HGB 14.1  < > 7.8* 6.5* 8.4*  HCT 39.2  < > 22.0* 18.6* 23.7*  MCV 85.8  < > 86.6 89.0 85.6  PLT 208  < > 161 151 PLATELET CLUMPS NOTED ON SMEAR, COUNT APPEARS DECREASED  < > = values in this interval not displayed.  Lipid Panel:  Recent Labs  04/28/13 1221 08/28/13 1244 01/15/14 1236  HDL 87.70 76.40 84.50    CT HEAD WITHOUT CONTRAST   CT CERVICAL SPINE WITHOUT CONTRAST   TECHNIQUE: Multidetector CT imaging of the head and cervical spine was performed following the standard protocol without intravenous contrast. Multiplanar CT image reconstructions of the cervical spine were also generated.   COMPARISON:  03/28/2006   FINDINGS: CT HEAD FINDINGS   Ventricles are normal in configuration. There is ventricular and sulcal enlargement reflecting mild atrophy. No parenchymal masses or mass effect. There is no evidence of a recent infarct. Patchy white matter hypoattenuation is noted most consistent with moderate chronic microvascular ischemic change.   No extra-axial masses or abnormal fluid collections.   There is no intracranial hemorrhage.   The visualized sinuses and mastoid air cells are  clear. No skull fracture.   CT CERVICAL SPINE FINDINGS   There are well-defined lucent lines crossing the pedicles of C4, with a subtle oblique lucent line crossing the right lamina at C4. Although these could potentially be acute fractures a have a chronic appearance. There is no associated soft tissue edema or hemorrhage.   No other evidence of a fracture.   No spondylolisthesis. There is reversal of the normal cervical lordosis centered at C5. There is moderate loss of disk height at C5-C6 and C6-C7 with mild loss of disk height at C4-C5. There are prominent endplate osteophytes along the mid and lower cervical spine. Facet degenerative changes also noted bilaterally, greatest on the right at C2-C3 and on the left at C2-C3 and C3-C4. The bones are demineralized.   The soft tissues are unremarkable.   The lung apices show scarring, but no acute findings.  IMPRESSION: HEAD CT:  No acute intracranial abnormality.  No skull fracture.   CERVICAL CT: Defects are noted across the pedicles at C4 as well as the right C4 lamina. Although these could potentially reflect acute fractures, and a chronic appearance and potentially could be developmental. If the patient is a reliable historian, and does not have significant neck pain, the should be considered chronic or developmental. If there is neck pain or neck pain cannot be ascertained, consider followup cervical MRI.   No other evidence of a fracture.  Advanced degenerative changes. PELVIS - 1-2 VIEW   COMPARISON:  None.   FINDINGS: Severe degenerative joint disease is seen involving both hip joints. Diffuse osteopenia is noted. Moderately displaced fracture is seen involving the trochanteric region of proximal right femur.   IMPRESSION: Moderately displaced intertrochanteric fracture of the proximal right femur. Severe degenerative joint disease is seen involving both hip joints. RIGHT FEMUR - 2 VIEW   COMPARISON:   None.   FINDINGS: A mildly comminuted intratrochanteric fracture of the proximal right femur is noted with mild valgus angulation.   Diffuse osteopenia is present.   Severe degenerative changes in the right hip are noted.   IMPRESSION: Mildly comminuted intertrochanteric right femur fracture with mild valgus angulation. PORTABLE CHEST - 1 VIEW   COMPARISON:  Radiographs 11/01/2013.  CT 10/06/2012.   FINDINGS: The heart size and mediastinal contours are stable. There is stable bilateral superior hilar retraction. Biapical pleural parenchymal scarring, lesser nodularity at the lung bases and calcified right hilar/mediastinal lymph nodes are grossly unchanged. No superimposed airspace disease or significant pleural effusion is seen.   IMPRESSION: Stable chronic lung disease most consistent with indolent infection as correlated with prior CT. No acute superimposed findings identified.   ASSESSMENT/PLAN:  Right femur fracture-status post pinning. Continue rehabilitation. Acute blood loss anemia-status post transfusion. Check hemoglobin level. COPD-compensated Constipation-new problem. Start MiraLax 17 g daily. idiopathic peripheral neuropathy-continue Neurontin. Check CBC and BMP  I have reviewed patient's medical records received at admission/from hospitalization.  CPT CODE: 74827  Gayani Y Dasanayaka, [place] [phone]

## 2014-02-26 ENCOUNTER — Encounter: Payer: Self-pay | Admitting: *Deleted

## 2014-03-09 ENCOUNTER — Encounter: Payer: Self-pay | Admitting: Adult Health

## 2014-03-15 ENCOUNTER — Ambulatory Visit: Payer: Medicare Other | Admitting: Podiatry

## 2014-03-19 ENCOUNTER — Ambulatory Visit: Payer: Medicare Other | Admitting: Physician Assistant

## 2014-03-21 ENCOUNTER — Other Ambulatory Visit: Payer: Self-pay | Admitting: *Deleted

## 2014-03-21 DIAGNOSIS — G4700 Insomnia, unspecified: Secondary | ICD-10-CM

## 2014-03-21 NOTE — Telephone Encounter (Signed)
Will forward to  Dr. Brackbill for review 

## 2014-03-21 NOTE — Telephone Encounter (Signed)
Left message to call back  

## 2014-03-21 NOTE — Telephone Encounter (Signed)
Mia, a nurse from [facility] called wanting to know if the patient is to still be taking the zoloft. Also, she needs a refill of her Lorrin Mais called into [facility], but Mia stated that the dose was changed to 5mg  while the patient was in rehab. Mia can be reached at [phone]. Thanks, MI

## 2014-03-21 NOTE — Telephone Encounter (Signed)
Yes continue Zoloft and continue Ambien 5 mg at bedtime when necessary

## 2014-03-28 ENCOUNTER — Telehealth: Payer: Self-pay | Admitting: Cardiology

## 2014-03-28 MED ORDER — ZOLPIDEM TARTRATE 5 MG PO TABS: 5.0000 mg | ORAL_TABLET | Freq: Every evening | ORAL | Status: DC | PRN

## 2014-03-28 NOTE — Telephone Encounter (Signed)
See phone notes.

## 2014-03-28 NOTE — Telephone Encounter (Signed)
Called Ambien to [facility], ok per  Dr. Mare Ferrari

## 2014-03-28 NOTE — Telephone Encounter (Signed)
Call [place] and recommendations given. Called Ambien to [facility]

## 2014-03-28 NOTE — Telephone Encounter (Signed)
Following up:      Ambien 5 mg at bedtime when necessary pt needs a new script for this RN Mia with [facility]      [facility]  Or fax : [phone]

## 2014-04-03 ENCOUNTER — Telehealth: Payer: Self-pay | Admitting: Cardiology

## 2014-04-03 NOTE — Telephone Encounter (Signed)
New message          Pharmacy does not have Gavastatin 800mg  3 times daily / Is pt still taking this medication

## 2014-04-03 NOTE — Telephone Encounter (Signed)
Left message to call back  

## 2014-04-06 MED ORDER — GABAPENTIN 800 MG PO TABS: 800.0000 mg | ORAL_TABLET | Freq: Three times a day (TID) | ORAL | Status: DC

## 2014-04-06 NOTE — Telephone Encounter (Signed)
Refilled Gabapentin as requested.

## 2014-04-16 ENCOUNTER — Ambulatory Visit (INDEPENDENT_AMBULATORY_CARE_PROVIDER_SITE_OTHER): Payer: Medicare Other | Admitting: Podiatry

## 2014-04-16 ENCOUNTER — Encounter: Payer: Self-pay | Admitting: Podiatry

## 2014-04-16 DIAGNOSIS — B351 Tinea unguium: Secondary | ICD-10-CM

## 2014-04-16 DIAGNOSIS — M79609 Pain in unspecified limb: Secondary | ICD-10-CM

## 2014-04-16 DIAGNOSIS — M79673 Pain in unspecified foot: Secondary | ICD-10-CM

## 2014-04-16 NOTE — Progress Notes (Signed)
Subjective:     Patient ID: Jenny Torres, female   DOB: 04/04/1928, 80 y.o.   MRN: [id number]  HPI patient presents with nail disease thickness yellow painful bed 1-5 both feet with inability to feel her feet or cut her nails   Review of Systems     Objective:   Physical Exam Neurovascular status unchanged with thick yellow brittle nailbeds 1-5 both feet that are painful when pressed    Assessment:     Mycotic nail infection with pain 1-5 both feet    Plan:     Debris painful nailbeds 1-5 both feet with no iatrogenic bleeding noted      

## 2014-04-17 ENCOUNTER — Telehealth: Payer: Self-pay | Admitting: Cardiology

## 2014-04-17 NOTE — Telephone Encounter (Signed)
Okay to switch to gabapentin 400 mg tablets and take 2 of the them

## 2014-04-17 NOTE — Telephone Encounter (Signed)
Home health calling in with high BP readings: 178/96 Please call and advise

## 2014-04-17 NOTE — Telephone Encounter (Signed)
Left message to call back  

## 2014-04-17 NOTE — Telephone Encounter (Signed)
Other vital signs within normal limits. 150/80 and 130/70 last 2 blood pressures with therapy. Patient asymptomatic   Also, patient choked on Gabapentin 800 mg tablet this am and has not taken because so big and chalky. Ok to change to 400 mg 2 capsules?   Will forward to  Dr. Mare Ferrari for review

## 2014-04-17 NOTE — Telephone Encounter (Signed)
Discussed blood pressure with  Dr. Mare Ferrari and will have facility monitor blood pressure and call back if remains elevated. Spoke with Mia at facility and will send over for blood pressure checks to be done daily. Mia will star cutting Gabapentin 800 mg in half to see if it helps with swallowing. If no better will call back for new Rx.

## 2014-04-18 NOTE — Telephone Encounter (Signed)
Confirmation received on fax [phone]

## 2014-04-26 ENCOUNTER — Other Ambulatory Visit: Payer: Self-pay | Admitting: Cardiology

## 2014-04-26 ENCOUNTER — Telehealth: Payer: Self-pay | Admitting: *Deleted

## 2014-04-26 DIAGNOSIS — G4700 Insomnia, unspecified: Secondary | ICD-10-CM

## 2014-04-26 NOTE — Telephone Encounter (Signed)
Patient would like gabapentin(coated), ambien, and [facility] Thanks, MI

## 2014-04-27 NOTE — Telephone Encounter (Signed)
Left message for Jenny Torres to call back (dose of Gabapentin, patient has difficulty swallowing)

## 2014-05-02 MED ORDER — SERTRALINE HCL 25 MG PO TABS: 25.0000 mg | ORAL_TABLET | Freq: Every day | ORAL | Status: DC

## 2014-05-02 MED ORDER — ZOLPIDEM TARTRATE 5 MG PO TABS: 5.0000 mg | ORAL_TABLET | Freq: Every evening | ORAL | Status: DC | PRN

## 2014-05-02 NOTE — Telephone Encounter (Signed)
Left message to call back regarding to Gabapentin, Rx's will be signed for Ambien and Zolft and faxed to pharmacy

## 2014-05-07 ENCOUNTER — Telehealth: Payer: Self-pay | Admitting: Cardiology

## 2014-05-07 MED ORDER — GABAPENTIN 800 MG PO TABS: 800.0000 mg | ORAL_TABLET | Freq: Three times a day (TID) | ORAL | Status: DC

## 2014-05-07 MED ORDER — METOPROLOL TARTRATE 25 MG PO TABS: 12.5000 mg | ORAL_TABLET | Freq: Two times a day (BID) | ORAL | Status: DC

## 2014-05-07 NOTE — Telephone Encounter (Signed)
Jenny Torres requesting prescription for gabapentin and metoprolol to [facility]. She states gabapentin dose is 800mg  tid She was able to swallow recent prescription she received from [facility].

## 2014-05-07 NOTE — Telephone Encounter (Signed)
New message          Jenny Torres returning Jenny Torres phone call

## 2014-05-29 NOTE — Telephone Encounter (Signed)
Rx for Neurontin sent to 8/17 by Gregery Na RN

## 2014-06-08 NOTE — Progress Notes (Signed)
This encounter was created in error - please disregard.

## 2014-07-16 ENCOUNTER — Encounter: Payer: Self-pay | Admitting: Cardiology

## 2014-07-16 ENCOUNTER — Ambulatory Visit: Payer: Medicare Other | Admitting: Podiatry

## 2014-07-16 ENCOUNTER — Ambulatory Visit (INDEPENDENT_AMBULATORY_CARE_PROVIDER_SITE_OTHER): Payer: Medicare Other | Admitting: Cardiology

## 2014-07-16 VITALS — BP 130/90 | HR 51 | Ht 62.0 in | Wt 84.0 lb

## 2014-07-16 DIAGNOSIS — E7800 Pure hypercholesterolemia, unspecified: Secondary | ICD-10-CM

## 2014-07-16 DIAGNOSIS — D500 Iron deficiency anemia secondary to blood loss (chronic): Secondary | ICD-10-CM

## 2014-07-16 DIAGNOSIS — I341 Nonrheumatic mitral (valve) prolapse: Secondary | ICD-10-CM

## 2014-07-16 DIAGNOSIS — I10 Essential (primary) hypertension: Secondary | ICD-10-CM

## 2014-07-16 DIAGNOSIS — E785 Hyperlipidemia, unspecified: Secondary | ICD-10-CM

## 2014-07-16 DIAGNOSIS — G629 Polyneuropathy, unspecified: Secondary | ICD-10-CM

## 2014-07-16 DIAGNOSIS — G609 Hereditary and idiopathic neuropathy, unspecified: Secondary | ICD-10-CM

## 2014-07-16 LAB — CBC WITH DIFFERENTIAL/PLATELET
Basophils Absolute: 0.1 10*3/uL (ref 0.0–0.1)
Basophils Relative: 0.9 % (ref 0.0–3.0)
EOS PCT: 2.3 % (ref 0.0–5.0)
Eosinophils Absolute: 0.1 10*3/uL (ref 0.0–0.7)
HEMATOCRIT: 39.3 % (ref 36.0–46.0)
Hemoglobin: 13.1 g/dL (ref 12.0–15.0)
LYMPHS ABS: 1.5 10*3/uL (ref 0.7–4.0)
Lymphocytes Relative: 23.6 % (ref 12.0–46.0)
MCHC: 33.4 g/dL (ref 30.0–36.0)
MCV: 90.7 fl (ref 78.0–100.0)
MONO ABS: 0.7 10*3/uL (ref 0.1–1.0)
Monocytes Relative: 10.9 % (ref 3.0–12.0)
NEUTROS PCT: 62.3 % (ref 43.0–77.0)
Neutro Abs: 4 10*3/uL (ref 1.4–7.7)
Platelets: 272 10*3/uL (ref 150.0–400.0)
RBC: 4.34 Mil/uL (ref 3.87–5.11)
RDW: 13.9 % (ref 11.5–15.5)
WBC: 6.4 10*3/uL (ref 4.0–10.5)

## 2014-07-16 NOTE — Assessment & Plan Note (Signed)
Her blood pressure was high today on arrival.  I rechecked it later in the exam it was still borderline at 130/90.  She will monitor her blood pressure at [facility].  If it stays up we want to consider adding a low dose ACE inhibitor.  She is on beta blocker already.

## 2014-07-16 NOTE — Progress Notes (Signed)
Jenny Torres Date of Birth:  02/17/1928 [facility] [address] [place], [place]  [address] [phone]        Fax   [phone]   History of Present Illness: This pleasant 80 year old woman is seen for a scheduled four-month followup office visit.  She has a past history of palpitations and mitral valve prolapse. Also has a history of essential hypertension and hypercholesterolemia. She has a history of Raynaud's disease. She has had situational depression related to ongoing stress regarding her husband's dementia.  Also her son was recently diagnosed with ALS.  She has a history of allergies and past history of asthma. She has a past history of low vitamin B12 levels first noted by her neurologist at [facility]. She has vitamin B12 injections on hand at home but has not taken any for the past 6 months or more. She has had difficulty in gaining weight.  Since we last saw her she fell on 02/11/2014 and broke her right hip.  She underwent pinning of her hip by Dr. Doran Durand.  She spent time in [place] in place and then has moved to [facility] in an apartment.  The LPN's check on her several times a day and dispense her medications for her.   Current Outpatient Prescriptions  Medication Sig Dispense Refill  . acetaminophen (TYLENOL) 325 MG tablet Take 2 tablets (650 mg total) by mouth every 6 (six) hours as needed for mild pain (or Fever >/= 101).  30 tablet  0  . albuterol (PROVENTIL HFA;VENTOLIN HFA) 108 (90 BASE) MCG/ACT inhaler Inhale 1-2 puffs into the lungs every 6 (six) hours as needed for wheezing or shortness of breath.      Marland Kitchen aspirin 81 MG tablet Take 81 mg by mouth daily.        . Cholecalciferol (VITAMIN D3) 1000 UNITS CAPS Take 1 capsule by mouth as needed.       . docusate sodium 100 MG CAPS Take 100 mg by mouth daily as needed for mild constipation.  10 capsule  0  . gabapentin (NEURONTIN) 800 MG tablet Take 1 tablet (800 mg total) by mouth 3 (three) times  daily.  270 tablet  1  . LORazepam (ATIVAN) 1 MG tablet Take 0.5-1 tablets (0.5-1 mg total) by mouth daily as needed for anxiety.  30 tablet  0  . metoprolol tartrate (LOPRESSOR) 25 MG tablet Take 0.5 tablets (12.5 mg total) by mouth 2 (two) times daily.  90 tablet  1  . Multiple Vitamins-Minerals (MULTIVITAL) tablet Take 1 tablet by mouth daily.        Marland Kitchen OVER THE COUNTER MEDICATION Place 1 drop into the nose as needed (for congestion). Over the counter nasal spray      . OVER THE COUNTER MEDICATION Place 1 application into both eyes daily as needed. Use PRN to clean eyes before applying eye drops.. Name of medication is Systane lid wipes (Eyelid cleaning Wipes)      . sertraline (ZOLOFT) 25 MG tablet Take 1 tablet (25 mg total) by mouth daily.  90 tablet  3  . zolpidem (AMBIEN) 5 MG tablet Take 1 tablet (5 mg total) by mouth at bedtime as needed for sleep.  90 tablet  1  . Besifloxacin HCl (BESIVANCE) 0.6 % SUSP Place 1 drop into both eyes 2 (two) times daily.      . Difluprednate (DUREZOL) 0.05 % EMUL Place 1 drop into the right eye 2 (two) times daily.      Marland Kitchen  enoxaparin (LOVENOX) 30 MG/0.3ML injection Inject 0.3 mLs (30 mg total) into the skin daily.  12 Syringe  0  . ondansetron (ZOFRAN-ODT) 8 MG disintegrating tablet Take 8 mg by mouth daily as needed for nausea or vomiting.      . traMADol (ULTRAM) 50 MG tablet Take 1-2 tablets (50-100 mg total) by mouth every 6 (six) hours as needed for moderate pain.  30 tablet  0   No current facility-administered medications for this visit.    Allergies  Allergen Reactions  . Sulfamethoxazole Swelling    Swelling of the throat, mouth, tongue, nose  . Ceftin     GI problems  . Darvocet [Propoxyphene N-Acetaminophen] Itching  . Erythromycin     Upset stomach  . Hydrocod Polst-Cpm Polst Er     nausea  . Lovastatin     unknown  . Paroxetine Hcl     jittery  . Penicillins     unknown  . Theophyllines   . Zovirax [Acyclovir]     Per patient  lots of side effects    Patient Active Problem List   Diagnosis Date Noted  . Blood loss anemia 07/16/2014  . Unspecified constipation 02/20/2014  . Acute blood loss anemia 02/18/2014  . Insomnia 02/18/2014  . Protein-calorie malnutrition, severe 02/12/2014  . Femur fracture, right 02/11/2014  . Intertrochanteric fracture of right hip 02/11/2014  . Low vitamin B12 level 12/29/2012  . Pulmonary nodule 10/21/2012  . Idiopathic peripheral neuropathy   . MVP (mitral valve prolapse)   . Osteoporosis   . Raynaud's disease /phenomenon 04/24/2011  . UNSPECIFIED RETINAL VASCULAR OCCLUSION 05/22/2010  . HYPERLIPIDEMIA 11/10/2007  . ANXIETY 11/10/2007  . DEPRESSION 11/10/2007  . HYPERTENSION 11/10/2007  . COPD 11/10/2007  . GASTROESOPHAGEAL REFLUX DISEASE 11/10/2007  . EXTERNAL HEMORRHOIDS 04/08/2004  . DIVERTICULOSIS, COLON 04/08/2004  . POLYP, COLON 04/16/1998    History  Smoking status  . Former Smoker -- 1.00 packs/day for 25 years  . Types: Cigarettes  . Quit date: 09/22/1983  Smokeless tobacco  . Never Used    History  Alcohol Use  . Yes    Comment: rarely    Family History  Problem Relation Age of Onset  . Breast cancer Mother   . Liver disease Brother   . Cirrhosis Brother     Review of Systems: Constitutional: no fever chills diaphoresis or fatigue or change in weight.  Head and neck: no hearing loss, no epistaxis, no photophobia or visual disturbance. Respiratory: No cough, shortness of breath or wheezing. Cardiovascular: No chest pain peripheral edema, palpitations. Gastrointestinal: No abdominal distention, no abdominal pain, no change in bowel habits hematochezia or melena. Genitourinary: No dysuria, no frequency, no urgency, no nocturia. Musculoskeletal:No arthralgias, no back pain, no gait disturbance or myalgias. Neurological: No dizziness, no headaches, no numbness, no seizures, no syncope, no weakness, no tremors. Hematologic: No lymphadenopathy, no  easy bruising. Psychiatric: No confusion, no hallucinations, no sleep disturbance.    Physical Exam: Filed Vitals:   07/16/14 1057  BP: 130/90  Pulse:   The patient appears to be in no distress.  Patient is very thin.  She walks with a walker  Head and neck exam reveals that the pupils are equal and reactive.  The extraocular movements are full.  There is no scleral icterus.  Mouth and pharynx are benign.  No lymphadenopathy.  No carotid bruits.  The jugular venous pressure is normal.  Thyroid is not enlarged or tender.  Chest is clear to percussion  and auscultation.  No rales or rhonchi.  Expansion of the chest is symmetrical.  Heart reveals no abnormal lift or heave.  First and second heart sounds are normal.  There is no gallop rub or click.  There is a soft systolic heart murmur at the apex.  The abdomen is soft and nontender.  Bowel sounds are normoactive.  There is no hepatosplenomegaly or mass.  There are no abdominal bruits.  Extremities reveal no phlebitis or edema.  Pedal pulses are good.  There is no cyanosis or clubbing.  Neurologic exam is normal strength and no lateralizing weakness.  No sensory deficits.  Integument reveals no rash    Assessment / Plan: 1.  Labile hypertension 2. Hypercholesterolemia 3. peripheral neuropathy possibly secondary to prior statin usage. 4. mitral valve prolapse 5. Raynaud's phenomenon 6. history of right hip fracture with subsequent packed cell transfusion for blood loss anemia  Disposition: Laboratory is pending.  At the time of her hip surgery she required 2 units of packed cells for severe anemia.  We will get a followup CBC for blood loss anemia as well as fasting lipid panel hepatic function panel and basal metabolic panel today  Disposition: Recheck in 4 months for office visit CBC lipid panel hepatic function panel and basal metabolic panel

## 2014-07-16 NOTE — Assessment & Plan Note (Signed)
She does not rest well at night.  The gabapentin has helped somewhat. Now that she has had a hip fracture she uses a rolling walker.  She is able to walk from her building at [facility] to the building where her husband stays because of his dementia

## 2014-07-16 NOTE — Assessment & Plan Note (Signed)
The patient is unable to take statin drugs because of her peripheral neuropathy.  She also has a problem with unintentional weight loss.  She has lost 4 more pounds since last visit.  We have encouraged her to try to gain weight.

## 2014-07-16 NOTE — Assessment & Plan Note (Signed)
No chest pain or palpitations.  No increased shortness of breath.

## 2014-07-16 NOTE — Patient Instructions (Addendum)
Will obtain labs today and call you with the results (LP/BMET/HFP/CBC)  Your physician recommends that you continue on your current medications as directed. Please refer to the Current Medication list given to you today.  Your physician wants you to follow-up in: 4 months with fasting labs (lp/bmet/hfp/cbc)  You will receive a reminder letter in the mail two months in advance. If you don't receive a letter, please call our office to schedule the follow-up appointment.

## 2014-07-17 NOTE — Progress Notes (Signed)
Quick Note:  Please report to patient. The recent labs are stable. Continue same medication and careful diet. CBC is normal. She is no longer anemic. ______

## 2014-07-23 ENCOUNTER — Ambulatory Visit: Payer: Medicare Other | Admitting: Podiatry

## 2014-08-13 ENCOUNTER — Encounter: Payer: Self-pay | Admitting: Podiatry

## 2014-08-13 ENCOUNTER — Ambulatory Visit (INDEPENDENT_AMBULATORY_CARE_PROVIDER_SITE_OTHER): Payer: Medicare Other | Admitting: Podiatry

## 2014-08-13 DIAGNOSIS — M79673 Pain in unspecified foot: Secondary | ICD-10-CM

## 2014-08-13 DIAGNOSIS — B351 Tinea unguium: Secondary | ICD-10-CM

## 2014-08-13 NOTE — Progress Notes (Signed)
Subjective:     Patient ID: Jenny Torres, female   DOB: 09/22/1927, 80 y.o.   MRN: [id number]  HPI patient presents with nail disease thickness yellow painful bed 1-5 both feet with inability to feel her feet or cut her nails   Review of Systems     Objective:   Physical Exam Neurovascular status unchanged with thick yellow brittle nailbeds 1-5 both feet that are painful when pressed    Assessment:     Mycotic nail infection with pain 1-5 both feet    Plan:     Debris painful nailbeds 1-5 both feet with no iatrogenic bleeding noted      

## 2014-08-15 NOTE — Telephone Encounter (Signed)
Rx for Gabapentin and Metoprolol did not go through. Will forward to nurse for further action.

## 2014-08-22 ENCOUNTER — Telehealth: Payer: Self-pay | Admitting: Cardiology

## 2014-08-22 NOTE — Telephone Encounter (Signed)
New Msg   Left vm for pt informing of flu clinic Sat.

## 2014-10-26 ENCOUNTER — Telehealth: Payer: Self-pay | Admitting: Cardiology

## 2014-10-26 ENCOUNTER — Telehealth: Payer: Self-pay | Admitting: Psychiatry

## 2014-10-26 MED ORDER — AMLODIPINE BESYLATE 2.5 MG PO TABS: 2.5000 mg | ORAL_TABLET | Freq: Every day | ORAL | Status: DC

## 2014-10-26 NOTE — Telephone Encounter (Signed)
New Message     Pt c/o BP issue: STAT if pt c/o blurred vision, one-sided weakness or slurred speech  1. What are your last 5 BP readings? Today(144/96)/ 10/24/14 (174/90)-10/10/14 150/41 10/03/14 (170/92)  2. Are you having any other symptoms (ex. Dizziness, headache, blurred vision, passed out)? Not  3. What is your BP issue?to high/   Please give patient a call.

## 2014-10-26 NOTE — Telephone Encounter (Signed)
Tried home number listed 3 times and is not in service  Will continue to try

## 2014-10-26 NOTE — Telephone Encounter (Signed)
ERROR

## 2014-10-26 NOTE — Telephone Encounter (Signed)
Will forward to  Dr. Brackbill for review 

## 2014-10-26 NOTE — Telephone Encounter (Signed)
Advised patient, verbalized understanding  

## 2014-10-26 NOTE — Telephone Encounter (Signed)
F/u   Pt called to give different number.

## 2014-10-26 NOTE — Telephone Encounter (Signed)
For her high blood pressure, start amlodipine 2.5 mg one daily

## 2014-11-01 ENCOUNTER — Other Ambulatory Visit: Payer: Self-pay | Admitting: Cardiology

## 2014-11-12 ENCOUNTER — Ambulatory Visit (INDEPENDENT_AMBULATORY_CARE_PROVIDER_SITE_OTHER): Payer: Medicare Other | Admitting: Podiatry

## 2014-11-12 DIAGNOSIS — M79673 Pain in unspecified foot: Secondary | ICD-10-CM | POA: Diagnosis not present

## 2014-11-12 DIAGNOSIS — B351 Tinea unguium: Secondary | ICD-10-CM | POA: Diagnosis not present

## 2014-11-12 NOTE — Patient Instructions (Signed)

## 2014-11-12 NOTE — Progress Notes (Signed)
Subjective:     Patient ID: Jenny Torres, female   DOB: 30-Oct-1927, 81 y.o.   MRN: [id number]  HPI patient presents with nail disease thickness yellow painful bed 1-5 both feet with inability to feel her feet or cut her nails   Review of Systems     Objective:   Physical Exam Neurovascular status unchanged with thick yellow brittle nailbeds 1-5 both feet that are painful when pressed    Assessment:     Mycotic nail infection with pain 1-5 both feet    Plan:     Debris painful nailbeds 1-5 both feet with no iatrogenic bleeding noted

## 2014-11-19 ENCOUNTER — Encounter: Payer: Self-pay | Admitting: Cardiology

## 2014-11-19 ENCOUNTER — Ambulatory Visit (INDEPENDENT_AMBULATORY_CARE_PROVIDER_SITE_OTHER): Payer: Medicare Other | Admitting: Cardiology

## 2014-11-19 VITALS — BP 138/72 | HR 60 | Ht 62.0 in | Wt 88.8 lb

## 2014-11-19 DIAGNOSIS — E785 Hyperlipidemia, unspecified: Secondary | ICD-10-CM

## 2014-11-19 MED ORDER — AZITHROMYCIN 250 MG PO TABS: ORAL_TABLET | ORAL | Status: DC

## 2014-11-19 NOTE — Patient Instructions (Addendum)
Will obtain labs today and call you with the results (lp/bmet/hfp)  Your physician recommends that you schedule a follow-up appointment in: 4 months with fasting labs (lp/bmet/hfp)   Rx sent to [facility] for Zpak  Get plain Mucinex 600 mg twice a day as needed

## 2014-11-19 NOTE — Progress Notes (Signed)
Cardiology Office Note   Date:  11/19/2014   ID:  Jenny, Torres Feb 14, 1928, MRN [id number]  PCP:  Jenny Coco, MD  Cardiologist:    Jenny Coco, MD   No chief complaint on file.     History of Present Illness: Jenny Torres is a 81 y.o. female who presents for a four-month follow-up office visit This pleasant 81 year old woman is seen for a scheduled four-month followup office visit. She has a past history of palpitations and mitral valve prolapse. Also has a history of essential hypertension and hypercholesterolemia. She has a history of Raynaud's disease. She has had situational depression related to ongoing stress regarding her husband's dementia. Also her son was recently diagnosed with ALS. She has a history of allergies and past history of asthma. She has a past history of low vitamin B12 levels first noted by her neurologist at [facility]. She has vitamin B12 injections on hand at home but has not taken any for the past 6 months or more. She has had difficulty in gaining weight.  The patient fell on 02/11/2014 and broke her right hip. She underwent pinning of her hip by Dr. Doran Durand. She spent time in [place] in place and then has moved to [facility] in an apartment. The LPN's check on her several times a day and dispense her medications for her.   Past Medical History  Diagnosis Date  . Anxiety   . Depression   . COPD (chronic obstructive pulmonary disease)   . HTN (hypertension)   . Gastritis   . Polyp of colon   . Hemorrhoids, external   . Diverticula, colon   . GERD (gastroesophageal reflux disease)   . Carotid artery occlusion   . Idiopathic peripheral neuropathy     chronic  . Hyperlipidemia   . MVP (mitral valve prolapse)     stable  . Palpitations   . Osteoporosis   . Hyperlipidemia   . Raynaud's disease     Past Surgical History  Procedure Laterality Date  . Cholecystectomy    . C-sections      x2  . Intramedullary (im) nail  intertrochanteric Right 02/11/2014    Procedure: INTRAMEDULLARY (IM) NAIL INTERTROCHANTRIC;  Surgeon: [name], MD;  Location: [facility];  Service: Orthopedics;  Laterality: Right;     Current Outpatient Prescriptions  Medication Sig Dispense Refill  . acetaminophen (TYLENOL) 325 MG tablet Take 2 tablets (650 mg total) by mouth every 6 (six) hours as needed for mild pain (or Fever >/= 101). 30 tablet 0  . albuterol (PROVENTIL HFA;VENTOLIN HFA) 108 (90 BASE) MCG/ACT inhaler Inhale 1-2 puffs into the lungs every 6 (six) hours as needed for wheezing or shortness of breath.    Marland Kitchen amLODipine (NORVASC) 2.5 MG tablet Take 1 tablet (2.5 mg total) by mouth daily. 30 tablet 5  . aspirin 81 MG tablet Take 81 mg by mouth daily.      . Cholecalciferol (VITAMIN D3) 1000 UNITS CAPS Take 1 capsule by mouth as needed.     . docusate sodium 100 MG CAPS Take 100 mg by mouth daily as needed for mild constipation. 10 capsule 0  . enoxaparin (LOVENOX) 30 MG/0.3ML injection Inject 0.3 mLs (30 mg total) into the skin daily. 12 Syringe 0  . gabapentin (NEURONTIN) 800 MG tablet Take 1 tablet (800 mg total) by mouth 3 (three) times daily. 270 tablet 1  . LORazepam (ATIVAN) 1 MG tablet Take 0.5-1 tablets (0.5-1 mg total) by mouth  daily as needed for anxiety. 30 tablet 0  . metoprolol tartrate (LOPRESSOR) 25 MG tablet TAKE ONE-HALF (1/2) TABLET TWICE A DAY 90 tablet 0  . Multiple Vitamins-Minerals (MULTIVITAL) tablet Take 1 tablet by mouth daily.      . ondansetron (ZOFRAN-ODT) 8 MG disintegrating tablet Take 8 mg by mouth daily as needed for nausea or vomiting.    Marland Kitchen OVER THE COUNTER MEDICATION Place 1 drop into the nose as needed (for congestion). Over the counter nasal spray    . OVER THE COUNTER MEDICATION Place 1 application into both eyes daily as needed. Use PRN to clean eyes before applying eye drops.. Name of medication is Systane lid wipes (Eyelid cleaning Wipes)    . sertraline (ZOLOFT) 25 MG tablet Take 1 tablet (25  mg total) by mouth daily. 90 tablet 3  . zolpidem (AMBIEN) 5 MG tablet Take 1 tablet (5 mg total) by mouth at bedtime as needed for sleep. 90 tablet 1  . azithromycin (ZITHROMAX) 250 MG tablet 2 tablets on day one and then one daily until finished 6 each 0   No current facility-administered medications for this visit.    Allergies:   Lovastatin; Sulfamethoxazole; Darvocet; Erythromycin; Hydrocod polst-cpm polst er; Penicillins; Theophyllines; Zovirax; Ceftin; and Paroxetine hcl    Social History:  The patient  reports that she quit smoking about 31 years ago. Her smoking use included Cigarettes. She has a 25 pack-year smoking history. She has never used smokeless tobacco. She reports that she drinks alcohol. She reports that she does not use illicit drugs.   Family History:  The patient's family history includes Breast cancer in her mother; Cirrhosis in her brother; Liver disease in her brother.    ROS:  Please see the history of present illness.   Otherwise, review of systems are positive for none.   All other systems are reviewed and negative.    PHYSICAL EXAM: VS:  BP 138/72 mmHg  Pulse 60  Ht 5\' 2"  (1.575 m)  Wt 88 lb 12.8 oz (40.279 kg)  BMI 16.24 kg/m2  SpO2 97% , BMI Body mass index is 16.24 kg/(m^2). GEN: Well nourished, well developed, in no acute distress HEENT: normal Neck: no JVD, carotid bruits, or masses Cardiac: RRR; no murmurs, rubs, or gallops,no edema  Respiratory:  clear to auscultation bilaterally, normal work of breathing GI: soft, nontender, nondistended, + BS MS: no deformity or atrophy Skin: warm and dry, no rash Neuro:  Strength and sensation are intact Psych: euthymic mood, full affect   EKG:  EKG is not ordered today.    Recent Labs: 01/15/2014: ALT 19 02/14/2014: BUN 17; Creatinine 0.53; Potassium 4.5; Sodium 128* 07/16/2014: Hemoglobin 13.1; Platelets 272.0    Lipid Panel    Component Value Date/Time   CHOL 218* 01/15/2014 1236   TRIG 66.0  01/15/2014 1236   HDL 84.50 01/15/2014 1236   CHOLHDL 3 01/15/2014 1236   VLDL 13.2 01/15/2014 1236   LDLCALC 120* 01/15/2014 1236   LDLDIRECT 112.9 08/28/2013 1244      Wt Readings from Last 3 Encounters:  11/19/14 88 lb 12.8 oz (40.279 kg)  07/16/14 84 lb (38.102 kg)  03/09/14 84 lb (38.102 kg)        ASSESSMENT AND PLAN:  1. Labile hypertension 2. Hypercholesterolemia 3. peripheral neuropathy possibly secondary to prior statin usage. 4. mitral valve prolapse 5. Raynaud's phenomenon 6. history of right hip fracture with subsequent packed cell transfusion for blood loss anemia 7.  Sinusitis with green  sputum.  We will give her Mucinex and a Z-Pak   Current medicines are reviewed at length with the patient today.  The patient does not have concerns regarding medicines.  The following changes have been made:  no change    Orders Placed This Encounter  Procedures  . Basic metabolic panel  . Hepatic function panel  . Lipid panel  . Lipid panel  . Hepatic function panel  . Basic metabolic panel     Disposition:   FU with Dr. Mare Ferrari in 4 months for office visit lipid panel hepatic function panel and basal metabolic panel   Signed, Jenny Coco, MD  11/19/2014 1:44 PM    [facility] [place], [place], [place]  [address] Phone: [phone]; Fax: [phone]

## 2014-11-20 ENCOUNTER — Other Ambulatory Visit (INDEPENDENT_AMBULATORY_CARE_PROVIDER_SITE_OTHER): Payer: Medicare Other | Admitting: *Deleted

## 2014-11-20 DIAGNOSIS — E785 Hyperlipidemia, unspecified: Secondary | ICD-10-CM

## 2014-11-20 LAB — HEPATIC FUNCTION PANEL
ALT: 11 U/L (ref 0–35)
AST: 22 U/L (ref 0–37)
Albumin: 3.8 g/dL (ref 3.5–5.2)
Alkaline Phosphatase: 125 U/L — ABNORMAL HIGH (ref 39–117)
BILIRUBIN DIRECT: 0.1 mg/dL (ref 0.0–0.3)
TOTAL PROTEIN: 6.5 g/dL (ref 6.0–8.3)
Total Bilirubin: 0.5 mg/dL (ref 0.2–1.2)

## 2014-11-20 LAB — LIPID PANEL
Cholesterol: 189 mg/dL (ref 0–200)
HDL: 71.5 mg/dL (ref >39.00)
LDL Cholesterol: 106 mg/dL — ABNORMAL HIGH (ref 0–99)
NonHDL: 117.5
Total CHOL/HDL Ratio: 3
Triglycerides: 60 mg/dL (ref 0.0–149.0)
VLDL: 12 mg/dL (ref 0.0–40.0)

## 2014-11-20 LAB — BASIC METABOLIC PANEL
BUN: 11 mg/dL (ref 6–23)
CHLORIDE: 96 meq/L (ref 96–112)
CO2: 34 meq/L — AB (ref 19–32)
Calcium: 8.9 mg/dL (ref 8.4–10.5)
Creatinine, Ser: 0.55 mg/dL (ref 0.40–1.20)
GFR: 111.72 mL/min (ref >60.00)
GLUCOSE: 72 mg/dL (ref 70–99)
Potassium: 4.3 mEq/L (ref 3.5–5.1)
SODIUM: 130 meq/L — AB (ref 135–145)

## 2014-11-20 NOTE — Addendum Note (Signed)
Addended by: Eulis Foster on: 11/20/2014 10:13 AM   Modules accepted: Orders

## 2014-11-21 NOTE — Progress Notes (Signed)
Quick Note:  Please report to patient. The recent labs are stable. Continue same medication and careful diet. The LDL cholesterol is improved. The serum sodium is improved. ______

## 2014-11-27 ENCOUNTER — Other Ambulatory Visit: Payer: Self-pay | Admitting: Cardiology

## 2014-11-27 DIAGNOSIS — G629 Polyneuropathy, unspecified: Secondary | ICD-10-CM

## 2014-12-01 ENCOUNTER — Observation Stay (HOSPITAL_COMMUNITY)
Admission: EM | Admit: 2014-12-01 | Discharge: 2014-12-03 | Disposition: A | Discharge status: Home / Self Care | Payer: Medicare Other | Attending: Internal Medicine | Admitting: Internal Medicine

## 2014-12-01 ENCOUNTER — Encounter (HOSPITAL_COMMUNITY): Payer: Self-pay | Admitting: Emergency Medicine

## 2014-12-01 DIAGNOSIS — E222 Syndrome of inappropriate secretion of antidiuretic hormone: Secondary | ICD-10-CM | POA: Insufficient documentation

## 2014-12-01 DIAGNOSIS — I341 Nonrheumatic mitral (valve) prolapse: Secondary | ICD-10-CM | POA: Diagnosis not present

## 2014-12-01 DIAGNOSIS — G629 Polyneuropathy, unspecified: Secondary | ICD-10-CM

## 2014-12-01 DIAGNOSIS — M810 Age-related osteoporosis without current pathological fracture: Secondary | ICD-10-CM | POA: Diagnosis not present

## 2014-12-01 DIAGNOSIS — S0003XA Contusion of scalp, initial encounter: Secondary | ICD-10-CM | POA: Diagnosis not present

## 2014-12-01 DIAGNOSIS — Z79899 Other long term (current) drug therapy: Secondary | ICD-10-CM | POA: Insufficient documentation

## 2014-12-01 DIAGNOSIS — E785 Hyperlipidemia, unspecified: Secondary | ICD-10-CM | POA: Diagnosis not present

## 2014-12-01 DIAGNOSIS — W19XXXA Unspecified fall, initial encounter: Secondary | ICD-10-CM | POA: Diagnosis not present

## 2014-12-01 DIAGNOSIS — J449 Chronic obstructive pulmonary disease, unspecified: Secondary | ICD-10-CM | POA: Diagnosis not present

## 2014-12-01 DIAGNOSIS — Z7982 Long term (current) use of aspirin: Secondary | ICD-10-CM | POA: Diagnosis not present

## 2014-12-01 DIAGNOSIS — Z87891 Personal history of nicotine dependence: Secondary | ICD-10-CM | POA: Diagnosis not present

## 2014-12-01 DIAGNOSIS — Z681 Body mass index (BMI) 19 or less, adult: Secondary | ICD-10-CM | POA: Insufficient documentation

## 2014-12-01 DIAGNOSIS — R55 Syncope and collapse: Principal | ICD-10-CM | POA: Diagnosis present

## 2014-12-01 DIAGNOSIS — K219 Gastro-esophageal reflux disease without esophagitis: Secondary | ICD-10-CM | POA: Insufficient documentation

## 2014-12-01 DIAGNOSIS — E43 Unspecified severe protein-calorie malnutrition: Secondary | ICD-10-CM | POA: Diagnosis present

## 2014-12-01 DIAGNOSIS — S0101XA Laceration without foreign body of scalp, initial encounter: Secondary | ICD-10-CM | POA: Diagnosis not present

## 2014-12-01 DIAGNOSIS — I10 Essential (primary) hypertension: Secondary | ICD-10-CM | POA: Diagnosis present

## 2014-12-01 DIAGNOSIS — F419 Anxiety disorder, unspecified: Secondary | ICD-10-CM | POA: Diagnosis not present

## 2014-12-01 DIAGNOSIS — F329 Major depressive disorder, single episode, unspecified: Secondary | ICD-10-CM | POA: Diagnosis not present

## 2014-12-01 DIAGNOSIS — E871 Hypo-osmolality and hyponatremia: Secondary | ICD-10-CM | POA: Diagnosis not present

## 2014-12-01 DIAGNOSIS — S0990XA Unspecified injury of head, initial encounter: Secondary | ICD-10-CM

## 2014-12-01 HISTORY — DX: Anorexia: R63.0

## 2014-12-01 MED ORDER — ONDANSETRON HCL 4 MG/2ML IJ SOLN
4.0000 mg | Freq: Once | INTRAMUSCULAR | Status: AC
  Administered 2014-12-01: 4 mg via INTRAVENOUS
  Filled 2014-12-01: qty 2

## 2014-12-01 NOTE — ED Notes (Signed)
Patient had a syncopal episode while standing in her kitchen. Patient fell backwards and hit her head. Patient has a large hematoma on her posterior head. Patient reports she has been traveling all day and has had poor oral intake. Patient is A/ox4 and denies neck/back pain but states she has some pain in her left shoulder. CBG 95, HR 70's with PVC's, BP 177/86 hx of HTN, 97% on RA

## 2014-12-02 ENCOUNTER — Emergency Department (HOSPITAL_COMMUNITY): Payer: Medicare Other

## 2014-12-02 ENCOUNTER — Encounter (HOSPITAL_COMMUNITY): Payer: Self-pay | Admitting: Radiology

## 2014-12-02 DIAGNOSIS — I341 Nonrheumatic mitral (valve) prolapse: Secondary | ICD-10-CM

## 2014-12-02 DIAGNOSIS — R55 Syncope and collapse: Secondary | ICD-10-CM

## 2014-12-02 DIAGNOSIS — S0003XA Contusion of scalp, initial encounter: Secondary | ICD-10-CM | POA: Diagnosis present

## 2014-12-02 DIAGNOSIS — E871 Hypo-osmolality and hyponatremia: Secondary | ICD-10-CM | POA: Diagnosis present

## 2014-12-02 DIAGNOSIS — J449 Chronic obstructive pulmonary disease, unspecified: Secondary | ICD-10-CM

## 2014-12-02 DIAGNOSIS — I10 Essential (primary) hypertension: Secondary | ICD-10-CM | POA: Diagnosis present

## 2014-12-02 DIAGNOSIS — E43 Unspecified severe protein-calorie malnutrition: Secondary | ICD-10-CM

## 2014-12-02 DIAGNOSIS — E785 Hyperlipidemia, unspecified: Secondary | ICD-10-CM | POA: Diagnosis present

## 2014-12-02 DIAGNOSIS — S0990XA Unspecified injury of head, initial encounter: Secondary | ICD-10-CM

## 2014-12-02 LAB — COMPREHENSIVE METABOLIC PANEL
ALT: 15 U/L (ref 0–35)
AST: 27 U/L (ref 0–37)
Albumin: 3.9 g/dL (ref 3.5–5.2)
Alkaline Phosphatase: 135 U/L — ABNORMAL HIGH (ref 39–117)
Anion gap: 7 (ref 5–15)
BILIRUBIN TOTAL: 0.8 mg/dL (ref 0.3–1.2)
BUN: 6 mg/dL (ref 6–23)
CHLORIDE: 92 mmol/L — AB (ref 96–112)
CO2: 30 mmol/L (ref 19–32)
Calcium: 9.5 mg/dL (ref 8.4–10.5)
Creatinine, Ser: 0.52 mg/dL (ref 0.50–1.10)
GFR calc non Af Amer: 85 mL/min — ABNORMAL LOW (ref >90)
GLUCOSE: 111 mg/dL — AB (ref 70–99)
Potassium: 4.4 mmol/L (ref 3.5–5.1)
SODIUM: 129 mmol/L — AB (ref 135–145)
Total Protein: 7.1 g/dL (ref 6.0–8.3)

## 2014-12-02 LAB — I-STAT CHEM 8, ED
BUN: 7 mg/dL (ref 6–23)
CREATININE: 0.4 mg/dL — AB (ref 0.50–1.10)
Calcium, Ion: 1.13 mmol/L (ref 1.13–1.30)
Chloride: 92 mmol/L — ABNORMAL LOW (ref 96–112)
Glucose, Bld: 107 mg/dL — ABNORMAL HIGH (ref 70–99)
HEMATOCRIT: 42 % (ref 36.0–46.0)
Hemoglobin: 14.3 g/dL (ref 12.0–15.0)
Potassium: 5.4 mmol/L — ABNORMAL HIGH (ref 3.5–5.1)
SODIUM: 127 mmol/L — AB (ref 135–145)
TCO2: 24 mmol/L (ref 0–100)

## 2014-12-02 LAB — CBC WITH DIFFERENTIAL/PLATELET
Basophils Absolute: 0 10*3/uL (ref 0.0–0.1)
Basophils Relative: 0 % (ref 0–1)
Eosinophils Absolute: 0.1 10*3/uL (ref 0.0–0.7)
Eosinophils Relative: 1 % (ref 0–5)
HCT: 36.1 % (ref 36.0–46.0)
Hemoglobin: 12.9 g/dL (ref 12.0–15.0)
LYMPHS ABS: 1 10*3/uL (ref 0.7–4.0)
Lymphocytes Relative: 10 % — ABNORMAL LOW (ref 12–46)
MCH: 30.4 pg (ref 26.0–34.0)
MCHC: 35.7 g/dL (ref 30.0–36.0)
MCV: 85.1 fL (ref 78.0–100.0)
MONO ABS: 0.8 10*3/uL (ref 0.1–1.0)
Monocytes Relative: 8 % (ref 3–12)
Neutro Abs: 7.9 10*3/uL — ABNORMAL HIGH (ref 1.7–7.7)
Neutrophils Relative %: 81 % — ABNORMAL HIGH (ref 43–77)
Platelets: 240 10*3/uL (ref 150–400)
RBC: 4.24 MIL/uL (ref 3.87–5.11)
RDW: 12.6 % (ref 11.5–15.5)
WBC: 9.9 10*3/uL (ref 4.0–10.5)

## 2014-12-02 LAB — URINALYSIS, ROUTINE W REFLEX MICROSCOPIC
Bilirubin Urine: NEGATIVE
Glucose, UA: NEGATIVE mg/dL
HGB URINE DIPSTICK: NEGATIVE
Ketones, ur: 15 mg/dL — AB
Leukocytes, UA: NEGATIVE
NITRITE: NEGATIVE
PROTEIN: NEGATIVE mg/dL
Specific Gravity, Urine: 1.012 (ref 1.005–1.030)
Urobilinogen, UA: 1 mg/dL (ref 0.0–1.0)
pH: 7.5 (ref 5.0–8.0)

## 2014-12-02 LAB — TROPONIN I
Troponin I: <0.03 ng/mL (ref <0.031)
Troponin I: <0.03 ng/mL (ref <0.031)
Troponin I: <0.03 ng/mL (ref <0.031)

## 2014-12-02 LAB — NA AND K (SODIUM & POTASSIUM), RAND UR
Potassium Urine: 52 mmol/L
SODIUM UR: 117 mmol/L

## 2014-12-02 LAB — CBG MONITORING, ED: Glucose-Capillary: 110 mg/dL — ABNORMAL HIGH (ref 70–99)

## 2014-12-02 MED ORDER — SERTRALINE HCL 25 MG PO TABS
25.0000 mg | ORAL_TABLET | Freq: Every day | ORAL | Status: DC
  Administered 2014-12-02 – 2014-12-03 (×2): 25 mg via ORAL
  Filled 2014-12-02 (×2): qty 1

## 2014-12-02 MED ORDER — AMLODIPINE BESYLATE 2.5 MG PO TABS
2.5000 mg | ORAL_TABLET | Freq: Every day | ORAL | Status: DC
  Administered 2014-12-02 – 2014-12-03 (×2): 2.5 mg via ORAL
  Filled 2014-12-02 (×2): qty 1

## 2014-12-02 MED ORDER — VITAMIN D 1000 UNITS PO TABS
3000.0000 [IU] | ORAL_TABLET | Freq: Every morning | ORAL | Status: DC
  Administered 2014-12-02 – 2014-12-03 (×2): 3000 [IU] via ORAL
  Filled 2014-12-02 (×2): qty 3

## 2014-12-02 MED ORDER — ZOLPIDEM TARTRATE 5 MG PO TABS
5.0000 mg | ORAL_TABLET | Freq: Every evening | ORAL | Status: DC | PRN
  Administered 2014-12-02: 5 mg via ORAL
  Filled 2014-12-02: qty 1

## 2014-12-02 MED ORDER — ONDANSETRON HCL 4 MG/2ML IJ SOLN
4.0000 mg | Freq: Four times a day (QID) | INTRAMUSCULAR | Status: DC | PRN
  Administered 2014-12-02: 4 mg via INTRAVENOUS
  Filled 2014-12-02: qty 2

## 2014-12-02 MED ORDER — ONDANSETRON HCL 4 MG/2ML IJ SOLN
4.0000 mg | Freq: Once | INTRAMUSCULAR | Status: AC
  Administered 2014-12-02: 4 mg via INTRAVENOUS

## 2014-12-02 MED ORDER — ASPIRIN 81 MG PO CHEW
81.0000 mg | CHEWABLE_TABLET | Freq: Every day | ORAL | Status: DC
  Administered 2014-12-02 – 2014-12-03 (×2): 81 mg via ORAL
  Filled 2014-12-02 (×2): qty 1

## 2014-12-02 MED ORDER — ACETAMINOPHEN 325 MG PO TABS
650.0000 mg | ORAL_TABLET | Freq: Four times a day (QID) | ORAL | Status: DC | PRN
  Administered 2014-12-03 (×2): 650 mg via ORAL
  Filled 2014-12-02 (×2): qty 2

## 2014-12-02 MED ORDER — SODIUM CHLORIDE 0.9 % IJ SOLN
3.0000 mL | Freq: Two times a day (BID) | INTRAMUSCULAR | Status: DC
Start: 2014-12-02 — End: 2014-12-03
  Administered 2014-12-02: 3 mL via INTRAVENOUS

## 2014-12-02 MED ORDER — ACETAMINOPHEN 650 MG RE SUPP: 650.0000 mg | Freq: Four times a day (QID) | RECTAL | Status: DC | PRN

## 2014-12-02 MED ORDER — GABAPENTIN 400 MG PO CAPS
800.0000 mg | ORAL_CAPSULE | Freq: Three times a day (TID) | ORAL | Status: DC
  Administered 2014-12-02 – 2014-12-03 (×5): 800 mg via ORAL
  Filled 2014-12-02 (×6): qty 2

## 2014-12-02 MED ORDER — HYDROMORPHONE HCL 1 MG/ML IJ SOLN: 0.5000 mg | INTRAMUSCULAR | Status: DC | PRN

## 2014-12-02 MED ORDER — METOPROLOL TARTRATE 12.5 MG HALF TABLET
12.5000 mg | ORAL_TABLET | Freq: Two times a day (BID) | ORAL | Status: DC
  Administered 2014-12-02 – 2014-12-03 (×3): 12.5 mg via ORAL
  Filled 2014-12-02 (×4): qty 1

## 2014-12-02 MED ORDER — GABAPENTIN 800 MG PO TABS: 800.0000 mg | ORAL_TABLET | Freq: Three times a day (TID) | ORAL | Status: DC

## 2014-12-02 MED ORDER — SODIUM CHLORIDE 0.9 % IV SOLN: 250.0000 mL | INTRAVENOUS | Status: DC | PRN

## 2014-12-02 MED ORDER — ONDANSETRON HCL 4 MG/2ML IJ SOLN
INTRAMUSCULAR | Status: AC
Start: 2014-12-02 — End: 2014-12-02
  Filled 2014-12-02: qty 2

## 2014-12-02 MED ORDER — SODIUM CHLORIDE 0.9 % IJ SOLN: 3.0000 mL | Freq: Two times a day (BID) | INTRAMUSCULAR | Status: DC

## 2014-12-02 MED ORDER — ALUM & MAG HYDROXIDE-SIMETH 200-200-20 MG/5ML PO SUSP: 30.0000 mL | Freq: Four times a day (QID) | ORAL | Status: DC | PRN

## 2014-12-02 MED ORDER — SODIUM CHLORIDE 0.9 % IJ SOLN
3.0000 mL | INTRAMUSCULAR | Status: DC | PRN
  Administered 2014-12-02: 3 mL via INTRAVENOUS
  Filled 2014-12-02: qty 3

## 2014-12-02 MED ORDER — SODIUM CHLORIDE 0.9 % IV SOLN
INTRAVENOUS | Status: AC
  Administered 2014-12-02: 11:00:00 via INTRAVENOUS

## 2014-12-02 NOTE — ED Notes (Signed)
Transporting Patient to new room assignment.

## 2014-12-02 NOTE — Progress Notes (Signed)
PROGRESS NOTE    Jenny Torres ZOX:[id number] DOB: [birth year] DOA: 12/01/2014 PCP: Darlin Coco, MD  HPI/Brief narrative 81 year old female with history of MVP, palpitations, COPD, HTN, anxiety & depression, HLD, carotid artery occlusion, B-12 deficiency, peripheral neuropathy, Raynaud's phenomenon, resident of an apartment, ambulates with the help of a walker, was in her usual state of health until 7 PM on 3/12 when she got up to either put some food or take out from microwave and abruptly passed out without warning symptoms and remembers waking up on the floor. She had hit her head to the floor. She denies dizziness, lightheadedness, chest pain or palpitations. Oral intake has been usual. She denies prior such episodes. In the ED, sodium 129 and CT head without acute findings.   Assessment/Plan:  1. Syncope and collapse: Unclear etiology. No warning signs. DD-orthostatic hypotension, vasovagal, arrhythmias, structural heart lesions versus other etiology. Will request 2-D echo and carotid Dopplers (none in system). Mild orthostatics (SBP dropped from 183 supine > 163 mmHg standing). May need heart monitor at discharge. Sustained occipital scalp laceration-no bleeding. EKG: Sinus rhythm without acute changes. QTC 452 ms. 2. Right posterior scalp laceration/hematoma: No active bleeding. Monitor. 3. Essential hypertension: Continue metoprolol and amlodipine. Uncontrolled. 4. Hyponatremia: Upon chart review, seems chronic in nature. Check serum and urine osmolarity. Brief IV normal saline. Follow BMP. 5. History of MVP and palpitations: Check 2-D echo and continue telemetry. 6. History of carotid artery occlusion: Check carotid Dopplers. 7. Anxiety and depression: Continue sertraline.   Code Status: Full Family Communication: None at bedside. Disposition Plan: DC home when stable.   Consultants:  None  Procedures:  None  Antibiotics:  None   Subjective: Mild headache.  Denies any other complaints.  Objective: Filed Vitals:   12/02/14 0530 12/02/14 0607 12/02/14 0659 12/02/14 1124  BP: 143/63 141/57 166/83 176/77  Pulse: 84 84 91 84  Temp:   98.2 F (36.8 C) 98.4 F (36.9 C)  TempSrc:   Oral Oral  Resp: 20 28 20 16   Height:   5\' 2"  (1.575 m)   Weight:   39.009 kg (86 lb)   SpO2: 96% 97% 97% 99%   No intake or output data in the 24 hours ending 12/02/14 1232 Filed Weights   12/02/14 0659  Weight: 39.009 kg (86 lb)     Exam:  General exam: Pleasant elderly frail female patient lying comfortably in bed. Respiratory system: Clear. No increased work of breathing. Cardiovascular system: S1 & S2 heard, RRR. No JVD, murmurs, gallops, clicks or pedal edema. Telemetry: Sinus rhythm. Gastrointestinal system: Abdomen is nondistended, soft and nontender. Normal bowel sounds heard. Central nervous system: Alert and oriented. No focal neurological deficits. Extremities: Symmetric 5 x 5 power. HEENT: Approximately 3 cm linear right scalp laceration-not actively bleeding and no acute findings. PERTLA.   Data Reviewed: Basic Metabolic Panel:  Recent Labs Lab 12/02/14 0030 12/02/14 0034  NA 129* 127*  K 4.4 5.4*  CL 92* 92*  CO2 30  --   GLUCOSE 111* 107*  BUN 6 7  CREATININE 0.52 0.40*  CALCIUM 9.5  --    Liver Function Tests:  Recent Labs Lab 12/02/14 0030  AST 27  ALT 15  ALKPHOS 135*  BILITOT 0.8  PROT 7.1  ALBUMIN 3.9   No results for input(s): LIPASE, AMYLASE in the last 168 hours. No results for input(s): AMMONIA in the last 168 hours. CBC:  Recent Labs Lab 12/02/14 0030 12/02/14 0034  WBC 9.9  --  NEUTROABS 7.9*  --   HGB 12.9 14.3  HCT 36.1 42.0  MCV 85.1  --   PLT 240  --    Cardiac Enzymes:  Recent Labs Lab 12/02/14 0827  TROPONINI <0.03   BNP (last 3 results) No results for input(s): PROBNP in the last 8760 hours. CBG:  Recent Labs Lab 12/02/14 0145  GLUCAP 110*    No results found for this or  any previous visit (from the past 240 hour(s)).         Studies: Ct Head Wo Contrast  12/02/2014   CLINICAL DATA:  81 year old female with syncope and loss of consciousness while standing in her kitchen. Fall backwards hitting head. Large hematoma on posterior head.  EXAM: CT HEAD WITHOUT CONTRAST  TECHNIQUE: Contiguous axial images were obtained from the base of the skull through the vertex without intravenous contrast.  COMPARISON:  Head CT 02/11/2014  FINDINGS: Stable atrophy and chronic small vessel ischemic change No intracranial hemorrhage, mass effect, or midline shift. No hydrocephalus. The basilar cisterns are patent. No evidence of territorial infarct. No intracranial fluid collection. There is a moderate right subgaleal hematoma extending from the ear frontal to the occipital lobe. No calvarial fracture. Included paranasal sinuses and mastoid air cells are well aerated.  IMPRESSION: 1. Right posterior scalp hematoma. No intracranial abnormality or calvarial fracture. 2. Stable atrophy and chronic small vessel ischemic change.   Electronically Signed   By: Jeb Levering M.D.   On: 12/02/2014 04:29        Scheduled Meds: . amLODipine  2.5 mg Oral Daily  . aspirin  81 mg Oral Daily  . cholecalciferol  3,000 Units Oral q morning - 10a  . gabapentin  800 mg Oral TID  . metoprolol tartrate  12.5 mg Oral BID  . sertraline  25 mg Oral Daily  . sodium chloride  3 mL Intravenous Q12H  . sodium chloride  3 mL Intravenous Q12H   Continuous Infusions: . sodium chloride 60 mL/hr at 12/02/14 1129    Principal Problem:   Syncope and collapse Active Problems:   MVP (mitral valve prolapse)   Protein-calorie malnutrition, severe   Traumatic hematoma of scalp   Hyponatremia   Hyperlipidemia   COPD (chronic obstructive pulmonary disease)   HTN (hypertension)    Time spent: 30 minutes.    Vernell Leep, MD, FACP, FHM. [facility] Pager [phone]  If 7PM-7AM, please  contact night-coverage [email or web address] Password [facility] 12/02/2014, 12:32 PM

## 2014-12-02 NOTE — ED Provider Notes (Signed)
CSN: [id number]     Arrival date & time 12/01/14  2313 History  This chart was scribed for Julianne Rice, MD by Eustaquio Maize, ED Scribe. This patient was seen in room D36C/D36C and the patient's care was started at 12:08 AM.    Chief Complaint  Patient presents with  . Loss of Consciousness   The history is provided by the patient. No language interpreter was used.     HPI Comments: Jenny Torres is a 81 y.o. female who presents to the Emergency Department complaining of a syncopal episode that occurred earlier tonight. Pt reports that she woke up, finding herself on the floor in the kitchen. The last thing she remembers is walking to the kitchen prior to the episode. She reports that she felt fine earlier today. She denies feeling dizzy or lightheaded prior to the episode. She states that she feels scared right now and complains of occipital head pain. Pt reports that she must have hit her head, causing the pain. Pt claims that she has never had these symptoms before. She also denies nausea, vomiting, diarrhea, neck pain, or any other symptoms. She is currently taking daily aspirin but denies any other anticoagulants.   PCP/Cardiologist - Dr. Mare Ferrari  Past Medical History  Diagnosis Date  . Anxiety   . Depression   . COPD (chronic obstructive pulmonary disease)   . HTN (hypertension)   . Gastritis   . Polyp of colon   . Hemorrhoids, external   . Diverticula, colon   . GERD (gastroesophageal reflux disease)   . Carotid artery occlusion   . Idiopathic peripheral neuropathy     chronic  . Hyperlipidemia   . MVP (mitral valve prolapse)     stable  . Palpitations   . Osteoporosis   . Hyperlipidemia   . Raynaud's disease    Past Surgical History  Procedure Laterality Date  . Cholecystectomy    . C-sections      x2  . Intramedullary (im) nail intertrochanteric Right 02/11/2014    Procedure: INTRAMEDULLARY (IM) NAIL INTERTROCHANTRIC;  Surgeon: [name], MD;  Location:  [facility];  Service: Orthopedics;  Laterality: Right;   Family History  Problem Relation Age of Onset  . Breast cancer Mother   . Liver disease Brother   . Cirrhosis Brother    History  Substance Use Topics  . Smoking status: Former Smoker -- 1.00 packs/day for 25 years    Types: Cigarettes    Quit date: 09/22/1983  . Smokeless tobacco: Never Used  . Alcohol Use: Yes     Comment: rarely   OB History    No data available     Review of Systems  Constitutional: Negative for fever and chills.  Respiratory: Negative for cough and shortness of breath.   Cardiovascular: Negative for chest pain.  Gastrointestinal: Negative for nausea, vomiting, abdominal pain and diarrhea.  Musculoskeletal: Negative for back pain and neck pain.  Skin: Positive for wound.  Neurological: Positive for syncope and headaches (Occipital region of head. ). Negative for dizziness, weakness, light-headedness and numbness.  All other systems reviewed and are negative.     Allergies  Lovastatin; Sulfamethoxazole; Darvocet; Erythromycin; Hydrocod polst-cpm polst er; Penicillins; Theophyllines; Zovirax; Ceftin; and Paroxetine hcl  Home Medications   Prior to Admission medications   Medication Sig Start Date End Date Taking? Authorizing Provider  acetaminophen (TYLENOL) 325 MG tablet Take 2 tablets (650 mg total) by mouth every 6 (six) hours as needed for mild pain (or  Fever >/= 101). 02/13/14  Yes [name], MD  amLODipine (NORVASC) 2.5 MG tablet Take 1 tablet (2.5 mg total) by mouth daily. 10/26/14  Yes Darlin Coco, MD  aspirin 81 MG tablet Take 81 mg by mouth daily.     Yes Historical Provider, MD  Cholecalciferol (VITAMIN D3) 1000 UNITS CAPS Take 1 capsule by mouth as needed.    Yes Historical Provider, MD  gabapentin (NEURONTIN) 800 MG tablet TAKE 1 TABLET THREE TIMES A DAY 11/29/14  Yes Darlin Coco, MD  metoprolol tartrate (LOPRESSOR) 25 MG tablet TAKE ONE-HALF (1/2) TABLET TWICE A DAY 11/02/14  Yes  Darlin Coco, MD  sertraline (ZOLOFT) 25 MG tablet Take 1 tablet (25 mg total) by mouth daily. 05/02/14  Yes Darlin Coco, MD  zolpidem (AMBIEN) 5 MG tablet Take 1 tablet (5 mg total) by mouth at bedtime as needed for sleep. 05/02/14  Yes Darlin Coco, MD  albuterol (PROVENTIL HFA;VENTOLIN HFA) 108 (90 BASE) MCG/ACT inhaler Inhale 1-2 puffs into the lungs every 6 (six) hours as needed for wheezing or shortness of breath.    Historical Provider, MD  azithromycin (ZITHROMAX) 250 MG tablet 2 tablets on day one and then one daily until finished Patient not taking: Reported on 12/01/2014 11/19/14   Darlin Coco, MD  docusate sodium 100 MG CAPS Take 100 mg by mouth daily as needed for mild constipation. Patient not taking: Reported on 12/01/2014 02/14/14   Robbie Lis, MD  enoxaparin (LOVENOX) 30 MG/0.3ML injection Inject 0.3 mLs (30 mg total) into the skin daily. Patient not taking: Reported on 12/01/2014 02/13/14   [name], MD  LORazepam (ATIVAN) 1 MG tablet Take 0.5-1 tablets (0.5-1 mg total) by mouth daily as needed for anxiety. Patient not taking: Reported on 12/01/2014 02/14/14   Robbie Lis, MD   Triage Vitals: BP 178/89 mmHg  Pulse 78  Resp 22  SpO2 98%   Physical Exam  Constitutional: She is oriented to person, place, and time. She appears well-developed and well-nourished. No distress.  HENT:  Head: Normocephalic.  Mouth/Throat: Oropharynx is clear and moist.  Small hematoma to the occiput. No active bleeding.  Eyes: EOM are normal. Pupils are equal, round, and reactive to light.  Neck: Normal range of motion. Neck supple.  No posterior midline cervical tenderness to palpation.  Cardiovascular: Normal rate and regular rhythm.  Exam reveals no gallop and no friction rub.   No murmur heard. Pulmonary/Chest: Effort normal and breath sounds normal. No respiratory distress. She has no wheezes. She has no rales. She exhibits no tenderness.  Abdominal: Soft. Bowel sounds are  normal. She exhibits no distension and no mass. There is no tenderness. There is no rebound and no guarding.  Musculoskeletal: Normal range of motion. She exhibits no edema or tenderness.  No calf swelling or tenderness.  Neurological: She is alert and oriented to person, place, and time.  5/5 motor in all extremities. Sensation is fully intact.  Skin: Skin is warm and dry. No rash noted. No erythema.  Psychiatric: She has a normal mood and affect. Her behavior is normal.  Nursing note and vitals reviewed.   ED Course  Procedures (including critical care time)  DIAGNOSTIC STUDIES: Oxygen Saturation is 97% on RA, normal by my interpretation.    COORDINATION OF CARE: 12:12 AM-Discussed treatment plan which includes CT Head, CBC, CMP, UA, Chem 8, CBG with pt at bedside and pt agreed to plan.   Labs Review Labs Reviewed  CBC WITH DIFFERENTIAL/PLATELET -  Abnormal; Notable for the following:    Neutrophils Relative % 81 (*)    Neutro Abs 7.9 (*)    Lymphocytes Relative 10 (*)    All other components within normal limits  COMPREHENSIVE METABOLIC PANEL - Abnormal; Notable for the following:    Sodium 129 (*)    Chloride 92 (*)    Glucose, Bld 111 (*)    Alkaline Phosphatase 135 (*)    GFR calc non Af Amer 85 (*)    All other components within normal limits  URINALYSIS, ROUTINE W REFLEX MICROSCOPIC - Abnormal; Notable for the following:    APPearance CLOUDY (*)    Ketones, ur 15 (*)    All other components within normal limits  CBG MONITORING, ED - Abnormal; Notable for the following:    Glucose-Capillary 110 (*)    All other components within normal limits  I-STAT CHEM 8, ED - Abnormal; Notable for the following:    Sodium 127 (*)    Potassium 5.4 (*)    Chloride 92 (*)    Creatinine, Ser 0.40 (*)    Glucose, Bld 107 (*)    All other components within normal limits    Imaging Review Ct Head Wo Contrast  12/02/2014   CLINICAL DATA:  81 year old female with syncope and loss  of consciousness while standing in her kitchen. Fall backwards hitting head. Large hematoma on posterior head.  EXAM: CT HEAD WITHOUT CONTRAST  TECHNIQUE: Contiguous axial images were obtained from the base of the skull through the vertex without intravenous contrast.  COMPARISON:  Head CT 02/11/2014  FINDINGS: Stable atrophy and chronic small vessel ischemic change No intracranial hemorrhage, mass effect, or midline shift. No hydrocephalus. The basilar cisterns are patent. No evidence of territorial infarct. No intracranial fluid collection. There is a moderate right subgaleal hematoma extending from the ear frontal to the occipital lobe. No calvarial fracture. Included paranasal sinuses and mastoid air cells are well aerated.  IMPRESSION: 1. Right posterior scalp hematoma. No intracranial abnormality or calvarial fracture. 2. Stable atrophy and chronic small vessel ischemic change.   Electronically Signed   By: Jeb Levering M.D.   On: 12/02/2014 04:29     EKG Interpretation   Date/Time:  Saturday December 01 2014 23:24:14 EST Ventricular Rate:  71 PR Interval:  144 QRS Duration: 101 QT Interval:  416 QTC Calculation: 452 R Axis:   64 Text Interpretation:  Sinus rhythm RSR' in V1 or V2, right VCD or RVH  Confirmed by Lita Mains  MD, Dandra Shambaugh ([id number]) on 12/02/2014 12:13:26 AM      MDM   Final diagnoses:  Syncope and collapse  Closed head injury, initial encounter    I personally performed the services described in this documentation, which was scribed in my presence. The recorded information has been reviewed and is accurate.   Patient states had a normal neurologic exam. Discussed with Dr. Arnoldo Morale will admit to a observation telemetry bed.     Julianne Rice, MD 12/02/14 [phone]

## 2014-12-02 NOTE — H&P (Signed)
[facility] Admission History and Physical       Jenny Torres FBP:[id number] DOB: Aug 02, 1928 DOA: 12/01/2014  Referring physician:  PCP: Jenny Coco, MD  Specialists:   Chief Complaint:   HPI: Jenny Torres is a 81 y.o. female with a history of COPD, HTN, MVP, Hyperlipidemia who presents to the ED after suffering a syncopal event.   She is a resident of [place] and she reports getting up to go into her kitchen and next thing she knows she wakes up on her kitchen floor.   She denies any prodromal syndrome.   She was evaluated in the ED and a CT scan of the Brain revealed a posterior scalp hematoma and no acute findings , no fractures , she was referred for a syncope work up.      Review of Systems:  Constitutional: No Weight Loss, No Weight Gain, Night Sweats, Fevers, Chills, Dizziness, Light Headedness, Fatigue, or Generalized Weakness HEENT: No Headaches, Difficulty Swallowing,Tooth/Dental Problems,Sore Throat,  No Sneezing, Rhinitis, Ear Ache, Nasal Congestion, or Post Nasal Drip,  Cardio-vascular:  No Chest pain, Orthopnea, PND, Edema in Lower Extremities, Anasarca, Dizziness, Palpitations  Resp: No Dyspnea, No DOE, No Productive Cough, No Non-Productive Cough, No Hemoptysis, No Wheezing.    GI: No Heartburn, Indigestion, Abdominal Pain, Nausea, Vomiting, Diarrhea, Constipation, Hematemesis, Hematochezia, Melena, Change in Bowel Habits,  Loss of Appetite  GU: No Dysuria, No Change in Color of Urine, No Urgency or Urinary Frequency, No Flank pain.  Musculoskeletal: No Joint Pain or Swelling, No Decreased Range of Motion, No Back Pain.  Neurologic: No +Syncope, No Seizures, Muscle Weakness, Paresthesia, Vision Disturbance or Loss, No Diplopia, No Vertigo, No Difficulty Walking,  Skin: No Rash or Lesions. Psych: No Change in Mood or Affect, No Depression or Anxiety, No Memory loss, No Confusion, or Hallucinations   Past Medical History    Diagnosis Date  . Anxiety   . Depression   . COPD (chronic obstructive pulmonary disease)   . HTN (hypertension)   . Gastritis   . Polyp of colon   . Hemorrhoids, external   . Diverticula, colon   . GERD (gastroesophageal reflux disease)   . Carotid artery occlusion   . Idiopathic peripheral neuropathy     chronic  . Hyperlipidemia   . MVP (mitral valve prolapse)     stable  . Palpitations   . Osteoporosis   . Hyperlipidemia   . Raynaud's disease      Past Surgical History  Procedure Laterality Date  . Cholecystectomy    . C-sections      x2  . Intramedullary (im) nail intertrochanteric Right 02/11/2014    Procedure: INTRAMEDULLARY (IM) NAIL INTERTROCHANTRIC;  Surgeon: Jenny Simmer, MD;  Location: [facility];  Service: Orthopedics;  Laterality: Right;      Prior to Admission medications   Medication Sig Start Date End Date Taking? Authorizing Provider  acetaminophen (TYLENOL) 325 MG tablet Take 2 tablets (650 mg total) by mouth every 6 (six) hours as needed for mild pain (or Fever >/= 101). 02/13/14  Yes Jenny Simmer, MD  amLODipine (NORVASC) 2.5 MG tablet Take 1 tablet (2.5 mg total) by mouth daily. 10/26/14  Yes Jenny Coco, MD  aspirin 81 MG tablet Take 81 mg by mouth daily.     Yes Historical Provider, MD  Cholecalciferol (VITAMIN D3) 1000 UNITS CAPS Take 1 capsule by mouth as needed.    Yes Historical Provider, MD  gabapentin (NEURONTIN) 800 MG tablet TAKE  1 TABLET THREE TIMES A DAY 11/29/14  Yes Jenny Coco, MD  metoprolol tartrate (LOPRESSOR) 25 MG tablet TAKE ONE-HALF (1/2) TABLET TWICE A DAY 11/02/14  Yes Jenny Coco, MD  sertraline (ZOLOFT) 25 MG tablet Take 1 tablet (25 mg total) by mouth daily. 05/02/14  Yes Jenny Coco, MD  zolpidem (AMBIEN) 5 MG tablet Take 1 tablet (5 mg total) by mouth at bedtime as needed for sleep. 05/02/14  Yes Jenny Coco, MD  albuterol (PROVENTIL HFA;VENTOLIN HFA) 108 (90 BASE) MCG/ACT inhaler Inhale 1-2 puffs into the lungs  every 6 (six) hours as needed for wheezing or shortness of breath.    Historical Provider, MD  azithromycin (ZITHROMAX) 250 MG tablet 2 tablets on day one and then one daily until finished Patient not taking: Reported on 12/01/2014 11/19/14   Jenny Coco, MD  docusate sodium 100 MG CAPS Take 100 mg by mouth daily as needed for mild constipation. Patient not taking: Reported on 12/01/2014 02/14/14   Robbie Lis, MD  enoxaparin (LOVENOX) 30 MG/0.3ML injection Inject 0.3 mLs (30 mg total) into the skin daily. Patient not taking: Reported on 12/01/2014 02/13/14   Jenny Simmer, MD  LORazepam (ATIVAN) 1 MG tablet Take 0.5-1 tablets (0.5-1 mg total) by mouth daily as needed for anxiety. Patient not taking: Reported on 12/01/2014 02/14/14   Robbie Lis, MD     Allergies  Allergen Reactions  . Lovastatin Other (See Comments)    unknown  . Sulfamethoxazole Swelling    Swelling of the throat, mouth, tongue, nose  . Darvocet [Propoxyphene N-Acetaminophen] Itching  . Erythromycin Nausea And Vomiting    Upset stomach  . Hydrocod Polst-Cpm Polst Er Nausea Only    nausea  . Penicillins Other (See Comments)    unknown  . Theophyllines Other (See Comments)    Unknown reaction  . Zovirax [Acyclovir] Other (See Comments)    Per patient lots of side effects  . Ceftin Diarrhea    GI problems  . Paroxetine Hcl Other (See Comments)    jittery    Social History:  reports that she quit smoking about 31 years ago. Her smoking use included Cigarettes. She has a 25 pack-year smoking history. She has never used smokeless tobacco. She reports that she drinks alcohol. She reports that she does not use illicit drugs.    Family History  Problem Relation Age of Onset  . Breast cancer Mother   . Liver disease Brother   . Cirrhosis Brother        Physical Exam:  GEN:  Pleasant Thin Elderly  81 y.o. Caucasian female examined and in no acute distress; cooperative with exam Filed Vitals:   12/02/14 0430  12/02/14 0500 12/02/14 0530 12/02/14 0607  BP: 141/65 161/64 143/63 141/57  Pulse: 81 89 84 84  Resp: 29 22 20 28   SpO2: 97% 98% 96% 97%   Blood pressure 141/57, pulse 84, resp. rate 28, SpO2 97 %. PSYCH: She is alert and oriented x4; does not appear anxious does not appear depressed; affect is normal HEENT: Normocephalic and with a posterior Scalp Hematoma, Mucous membranes pink; PERRLA; EOM intact; Fundi:  Benign;  No scleral icterus, Nares: Patent, Oropharynx: Clear, Fair Dentition,    Neck:  FROM, No Cervical Lymphadenopathy nor Thyromegaly or Carotid Bruit; No JVD; Breasts:: Not examined CHEST WALL: No tenderness CHEST: Normal respiration, clear to auscultation bilaterally HEART: Regular rate and rhythm; no murmurs rubs or gallops BACK: No kyphosis or scoliosis; No CVA tenderness ABDOMEN: Positive  Bowel Sounds, Scaphoid, Soft Non-Tender, No Rebound or Guarding; No Masses, No Organomegaly. Rectal Exam: Not done EXTREMITIES: No  Cyanosis, Clubbing, or Edema; No Ulcerations. Genitalia: not examined PULSES: 2+ and symmetric SKIN: Normal hydration no rash or ulceration CNS:  Alert and Oriented x 4, No Focal Deficits Vascular: pulses palpable throughout    Labs on Admission:  Basic Metabolic Panel:  Recent Labs Lab 12/02/14 0030 12/02/14 0034  NA 129* 127*  K 4.4 5.4*  CL 92* 92*  CO2 30  --   GLUCOSE 111* 107*  BUN 6 7  CREATININE 0.52 0.40*  CALCIUM 9.5  --    Liver Function Tests:  Recent Labs Lab 12/02/14 0030  AST 27  ALT 15  ALKPHOS 135*  BILITOT 0.8  PROT 7.1  ALBUMIN 3.9   No results for input(s): LIPASE, AMYLASE in the last 168 hours. No results for input(s): AMMONIA in the last 168 hours. CBC:  Recent Labs Lab 12/02/14 0030 12/02/14 0034  WBC 9.9  --   NEUTROABS 7.9*  --   HGB 12.9 14.3  HCT 36.1 42.0  MCV 85.1  --   PLT 240  --    Cardiac Enzymes: No results for input(s): CKTOTAL, CKMB, CKMBINDEX, TROPONINI in the last 168 hours.  BNP  (last 3 results) No results for input(s): BNP in the last 8760 hours.  ProBNP (last 3 results) No results for input(s): PROBNP in the last 8760 hours.  CBG:  Recent Labs Lab 12/02/14 0145  GLUCAP 110*    Radiological Exams on Admission: Ct Head Wo Contrast  12/02/2014   CLINICAL DATA:  81 year old female with syncope and loss of consciousness while standing in her kitchen. Fall backwards hitting head. Large hematoma on posterior head.  EXAM: CT HEAD WITHOUT CONTRAST  TECHNIQUE: Contiguous axial images were obtained from the base of the skull through the vertex without intravenous contrast.  COMPARISON:  Head CT 02/11/2014  FINDINGS: Stable atrophy and chronic small vessel ischemic change No intracranial hemorrhage, mass effect, or midline shift. No hydrocephalus. The basilar cisterns are patent. No evidence of territorial infarct. No intracranial fluid collection. There is a moderate right subgaleal hematoma extending from the ear frontal to the occipital lobe. No calvarial fracture. Included paranasal sinuses and mastoid air cells are well aerated.  IMPRESSION: 1. Right posterior scalp hematoma. No intracranial abnormality or calvarial fracture. 2. Stable atrophy and chronic small vessel ischemic change.   Electronically Signed   By: Jeb Levering M.D.   On: 12/02/2014 04:29     EKG: Independently reviewed. Normal Sinus Rhythm at 71.      Assessment/Plan:   81 y.o. female with  Principal Problem:   1.    Syncope and collapse   Telemetry Monitoring   2D ECHO   Cycle Troponins   Neuro checks     Active Problems:   2.    Hyponatremia   Send Urine Osm and Urine Electrolytes   Fluid restrictions   Monitor sodium Trend        3.    Traumatic hematoma of scalp   Neuro Checks   Wound Care     4.    MVP (mitral valve prolapse)   2D ECHO    May need a TEE      5.    Protein-calorie malnutrition, severe   Nutrition Consult     6.    Hyperlipidemia      7.    COPD  (chronic obstructive pulmonary disease)  stable     8.    HTN (hypertension)   monitor BPs        9.    DVT Prophylaxis    SCDs    Code Status:     FULL CODE       Family Communication:    No Family Present    Disposition Plan:   Observation Status        Time spent:  [place] [facility] Pager [phone]   If [place] Please Contact the Day Rounding Team MD for [facility]  If 7PM-7AM, Please Contact Night-Floor Coverage  [email or web address] Password [facility] 12/02/2014, 6:21 AM     ADDENDUM:   Patient was seen and examined on 12/02/2014

## 2014-12-02 NOTE — Evaluation (Signed)
Physical Therapy Evaluation Patient Details Name: Jenny Torres MRN: [id number] DOB: 08-May-1928 Today's Date: 12/02/2014   History of Present Illness    81 year old female with history of MVP, palpitations, COPD, HTN, anxiety & depression, HLD, carotid artery occlusion, B-12 deficiency, peripheral neuropathy, Raynaud's phenomenon, resident of an apartment, ambulates with the help of a walker, was in her usual state of health until 7 PM on 3/12 when she got up to either put some food or take out from microwave and abruptly passed out without warning symptoms and remembers waking up on the floor. She had hit her head to the floor. She denies dizziness, lightheadedness, chest pain or palpitations. Oral intake has been usual. She denies prior such episodes. In the ED, sodium 129 and CT head without acute findings.   Clinical Impression  Pt presents at/near baseline level for functional mobility ADLs.  Will benefit from daily ambulation to maintain functional status and recommend nursing supervision.  No PT needs identified, however pt counseled to seek PT after d/c if she finds she has a change in status.  Pt agrees.      Follow Up Recommendations No PT follow up    Equipment Recommendations  None recommended by PT    Recommendations for Other Services       Precautions / Restrictions Precautions Precautions: Fall Precaution Comments: use RW, up with supervision for walking halls 1-2x/day      Mobility  Bed Mobility Overal bed mobility: Modified Independent                Transfers Overall transfer level: Needs assistance Equipment used: Rolling walker (2 wheeled) Transfers: Sit to/from Stand Sit to Stand: Supervision         General transfer comment: generally needs supervision for occasional instability/shakiness, pt attributes to poor intake and acknowledges she wants/needs to gain weight (nutrition consult for supplementation?)  Ambulation/Gait Ambulation/Gait  assistance: Supervision Ambulation Distance (Feet): 250 Feet Assistive device: Rolling walker (2 wheeled) Gait Pattern/deviations: Step-through pattern;Narrow base of support   Gait velocity interpretation: at or above normal speed for age/gender General Gait Details: pt deliberately uses tall posture, has stiffness in gait esp R>L that is near her baseline, and occasionally 'shutters' or shakes but attributes to decr intake.  no changes in breathing, onset of chest pain, or other indicators of cardiac involvement.    Stairs            Wheelchair Mobility    Modified Rankin (Stroke Patients Only)       Balance Overall balance assessment: No apparent balance deficits (not formally assessed) (uses RW)                                           Pertinent Vitals/Pain Pain Assessment: No/denies pain    Home Living Family/patient expects to be discharged to:: Assisted living               Home Equipment: Gilford Rile - 2 wheels Additional Comments: husband with dementia/Alz lives at [facility]    Prior Function Level of Independence: Independent with assistive device(s)               Hand Dominance        Extremity/Trunk Assessment   Upper Extremity Assessment: Overall WFL for tasks assessed           Lower Extremity Assessment: Overall WFL for tasks  assessed      Cervical / Trunk Assessment: Normal  Communication   Communication: No difficulties  Cognition Arousal/Alertness: Awake/alert Behavior During Therapy: WFL for tasks assessed/performed Overall Cognitive Status: Within Functional Limits for tasks assessed                      General Comments      Exercises        Assessment/Plan    PT Assessment Patent does not need any further PT services  PT Diagnosis Difficulty walking   PT Problem List    PT Treatment Interventions     PT Goals (Current goals can be found in the Care Plan section) Acute Rehab PT  Goals Patient Stated Goal: gp home PT Goal Formulation: All assessment and education complete, DC therapy    Frequency     Barriers to discharge        Co-evaluation               End of Session Equipment Utilized During Treatment: Gait belt Activity Tolerance: Patient tolerated treatment well Patient left: in chair;with call bell/phone within reach;with nursing/sitter in room Nurse Communication: Mobility status    Functional Assessment Tool Used: clinical judgement Functional Limitation: Mobility: Walking and moving around Mobility: Walking and Moving Around Current Status (B9390): At least 1 percent but less than 20 percent impaired, limited or restricted Mobility: Walking and Moving Around Goal Status [phone]): At least 1 percent but less than 20 percent impaired, limited or restricted Mobility: Walking and Moving Around Discharge Status [phone]): At least 1 percent but less than 20 percent impaired, limited or restricted    Time: 1551-1620 PT Time Calculation (min) (ACUTE ONLY): 29 min   Charges:   PT Evaluation $Initial PT Evaluation Tier I: 1 Procedure PT Treatments $Therapeutic Activity: 8-22 mins   PT G Codes:   PT G-Codes **NOT FOR INPATIENT CLASS** Functional Assessment Tool Used: clinical judgement Functional Limitation: Mobility: Walking and moving around Mobility: Walking and Moving Around Current Status (M2263): At least 1 percent but less than 20 percent impaired, limited or restricted Mobility: Walking and Moving Around Goal Status [phone]): At least 1 percent but less than 20 percent impaired, limited or restricted Mobility: Walking and Moving Around Discharge Status [phone]): At least 1 percent but less than 20 percent impaired, limited or restricted    Herbie Drape 12/02/2014, 4:31 PM

## 2014-12-02 NOTE — Progress Notes (Signed)
Utilization review completed.  P.J. Deniss Wormley,RN,BSN Case Manager 

## 2014-12-03 DIAGNOSIS — R55 Syncope and collapse: Secondary | ICD-10-CM

## 2014-12-03 DIAGNOSIS — S0003XD Contusion of scalp, subsequent encounter: Secondary | ICD-10-CM

## 2014-12-03 LAB — CBC
HCT: 33.9 % — ABNORMAL LOW (ref 36.0–46.0)
HEMOGLOBIN: 11.8 g/dL — AB (ref 12.0–15.0)
MCH: 30.6 pg (ref 26.0–34.0)
MCHC: 34.8 g/dL (ref 30.0–36.0)
MCV: 88.1 fL (ref 78.0–100.0)
Platelets: 249 10*3/uL (ref 150–400)
RBC: 3.85 MIL/uL — ABNORMAL LOW (ref 3.87–5.11)
RDW: 13 % (ref 11.5–15.5)
WBC: 7.1 10*3/uL (ref 4.0–10.5)

## 2014-12-03 LAB — BASIC METABOLIC PANEL
ANION GAP: 5 (ref 5–15)
BUN: 12 mg/dL (ref 6–23)
CO2: 29 mmol/L (ref 19–32)
CREATININE: 0.58 mg/dL (ref 0.50–1.10)
Calcium: 8.8 mg/dL (ref 8.4–10.5)
Chloride: 95 mmol/L — ABNORMAL LOW (ref 96–112)
GFR calc Af Amer: >90 mL/min (ref >90)
GFR calc non Af Amer: 82 mL/min — ABNORMAL LOW (ref >90)
Glucose, Bld: 84 mg/dL (ref 70–99)
POTASSIUM: 4.2 mmol/L (ref 3.5–5.1)
Sodium: 129 mmol/L — ABNORMAL LOW (ref 135–145)

## 2014-12-03 LAB — OSMOLALITY: Osmolality: 262 mOsm/kg — ABNORMAL LOW (ref 275–300)

## 2014-12-03 LAB — OSMOLALITY, URINE: OSMOLALITY UR: 507 mosm/kg (ref 390–1090)

## 2014-12-03 MED ORDER — GABAPENTIN 800 MG PO TABS: 800.0000 mg | ORAL_TABLET | Freq: Two times a day (BID) | ORAL | Status: DC

## 2014-12-03 MED ORDER — ENSURE COMPLETE PO LIQD: 237.0000 mL | Freq: Two times a day (BID) | ORAL | Status: DC

## 2014-12-03 NOTE — Progress Notes (Signed)
  Echocardiogram 2D Echocardiogram has been performed.  Jenny Torres 12/03/2014, 12:22 PM

## 2014-12-03 NOTE — Progress Notes (Signed)
INITIAL NUTRITION ASSESSMENT  DOCUMENTATION CODES Per approved criteria  -Severe malnutrition in the context of chronic illness -Underweight   INTERVENTION:  Ensure Enlive PO BID, each supplement provides 350 kcal and 20 grams of protein  NUTRITION DIAGNOSIS: Malnutrition related to inadequate oral intake as evidenced by severe depletion of muscle and subcutaneous fat mass.   Goal: Intake to meet >90% of estimated nutrition needs.  Monitor:  PO intake, labs, weight trend.  Reason for Assessment: Malnutrition Screening Tool  81 y.o. female  Admitting Dx: Syncope and collapse  ASSESSMENT: Patient presented to the ED on 3/12 after suffering a syncopal event. CT scan of the brain revealed a posterior scalp hematoma and no acute findings.  Patient with 2% weight loss within the past 2 weeks. She reports that she has been eating poorly for the past few days due to being upset about being in the hospital with some nausea and vomiting. She says she has always been small, 90 lbs when she married, 105 lbs when her babies were born, but she was healthy and ate the right kinds of foods then. Lately she has been eating very poorly and losing weight. She does not like any PO supplements, but agrees to drink them because she knows that she needs to gain weight.   Nutrition Focused Physical Exam:  Subcutaneous Fat:  Orbital Region: mild-moderate depletion Upper Arm Region: mild-moderate depletion Thoracic and Lumbar Region: severe depletion  Muscle:  Temple Region: moderate depletion Clavicle Bone Region: severe depletion Clavicle and Acromion Bone Region: severe depletion Scapular Bone Region: severe depletion Dorsal Hand: severe depletion Patellar Region: moderate depletion Anterior Thigh Region: moderate depletion Posterior Calf Region: moderate depletion  Edema: n/a  Height: Ht Readings from Last 1 Encounters:  12/02/14 5\' 2"  (1.575 m)    Weight: Wt Readings from Last 1  Encounters:  12/02/14 86 lb (39.009 kg)    Ideal Body Weight: 50 kg  % Ideal Body Weight: 78%  Wt Readings from Last 10 Encounters:  12/02/14 86 lb (39.009 kg)  11/19/14 88 lb 12.8 oz (40.279 kg)  07/16/14 84 lb (38.102 kg)  03/09/14 84 lb (38.102 kg)  02/18/14 86 lb (39.009 kg)  02/11/14 86 lb (39.009 kg)  01/15/14 88 lb (39.917 kg)  08/28/13 87 lb (39.463 kg)  08/07/13 90 lb (40.824 kg)  04/28/13 89 lb 6.4 oz (40.552 kg)    Usual Body Weight: 88 lbs  % Usual Body Weight: 98%  BMI:  Body mass index is 15.73 kg/(m^2). Underweight  Estimated Nutritional Needs: Kcal: 1200-1400 Protein: 60-70 gm Fluid: 1.2-1.4 L  Skin: WDL  Diet Order: Diet regular  EDUCATION NEEDS: -Education needs addressed   Intake/Output Summary (Last 24 hours) at 12/03/14 0940 Last data filed at 12/02/14 1825  Gross per 24 hour  Intake    298 ml  Output    700 ml  Net   -402 ml    Last BM: 3/12   Labs:   Recent Labs Lab 12/02/14 0030 12/02/14 0034 12/03/14 0530  NA 129* 127* 129*  K 4.4 5.4* 4.2  CL 92* 92* 95*  CO2 30  --  29  BUN 6 7 12   CREATININE 0.52 0.40* 0.58  CALCIUM 9.5  --  8.8  GLUCOSE 111* 107* 84    CBG (last 3)   Recent Labs  12/02/14 0145  GLUCAP 110*    Scheduled Meds: . amLODipine  2.5 mg Oral Daily  . aspirin  81 mg Oral Daily  .  cholecalciferol  3,000 Units Oral q morning - 10a  . gabapentin  800 mg Oral TID  . metoprolol tartrate  12.5 mg Oral BID  . sertraline  25 mg Oral Daily  . sodium chloride  3 mL Intravenous Q12H  . sodium chloride  3 mL Intravenous Q12H    Continuous Infusions:   Past Medical History  Diagnosis Date  . Anxiety   . Depression   . COPD (chronic obstructive pulmonary disease)   . HTN (hypertension)   . Gastritis   . Polyp of colon   . Hemorrhoids, external   . Diverticula, colon   . GERD (gastroesophageal reflux disease)   . Carotid artery occlusion   . Idiopathic peripheral neuropathy     chronic  .  Hyperlipidemia   . MVP (mitral valve prolapse)     stable  . Palpitations   . Osteoporosis   . Hyperlipidemia   . Raynaud's disease   . Anorexia     Past Surgical History  Procedure Laterality Date  . Cholecystectomy    . C-sections      x2  . Intramedullary (im) nail intertrochanteric Right 02/11/2014    Procedure: INTRAMEDULLARY (IM) NAIL INTERTROCHANTRIC;  Surgeon: [name], MD;  Location: [place] [facility];  Service: Orthopedics;  Laterality: Right;    Molli Barrows, RD, LDN, [place] Pager [phone] After Hours Pager [phone]

## 2014-12-03 NOTE — Progress Notes (Signed)
D/c instructions reviewed with pt and daughter. Copy of instructions given to pt. After pt dressed and ready to go, pt was d/c'd via wheelchair with belongings, escorted by unit NT.

## 2014-12-03 NOTE — Progress Notes (Signed)
VASCULAR LAB PRELIMINARY  PRELIMINARY  PRELIMINARY  PRELIMINARY  Carotid Bilateral Doppler completed.    Preliminary report: Calcified plaque in the right bifurcation/ proximal ICA with posterior shadowing.                                RT ICA/CCA ratio: 2.85    Velocities within the 60-79% stenosis RT proximal ICA.                                LT ICA/CCA ratio: 1.12     Velocities suggest stenosis in the 1-39% stenosis range LT ICA.                                                             Bilateral patent vertebral arteries with antegrade flow.  August Albino, RVT 12/03/2014, 12:03 PM

## 2014-12-03 NOTE — Progress Notes (Signed)
UR completed 

## 2014-12-03 NOTE — Progress Notes (Signed)
PROGRESS NOTE    Jenny Torres OIN:[id number] DOB: 01-Sep-1928 DOA: 12/01/2014 PCP: Darlin Coco, MD  HPI/Brief narrative 81 year old female with history of MVP, palpitations, COPD, HTN, anxiety & depression, HLD, carotid artery occlusion, B-12 deficiency, peripheral neuropathy, Raynaud's phenomenon, resident of an apartment, ambulates with the help of a walker, was in her usual state of health until 7 PM on 3/12 when she got up to either put some food or take out from microwave and abruptly passed out without warning symptoms and remembers waking up on the floor. She had hit her head to the floor. She denies dizziness, lightheadedness, chest pain or palpitations. Oral intake has been usual. She denies prior such episodes. In the ED, sodium 129 and CT head without acute findings.   Assessment/Plan:  1. Syncope and collapse: Unclear etiology. No warning signs. DD-orthostatic hypotension, vasovagal, arrhythmias, structural heart lesions versus other etiology. Was mildly orthostatic on 3/13. May need heart monitor at discharge. Sustained occipital scalp laceration-no bleeding. EKG: Sinus rhythm without acute changes. QTC 452 ms. echo: Pending. Carotid duplex results and recommendations as below. Discussed with primary cardiologist: We'll need 2 week event monitor at discharge. No driving for 6 months or until cleared by M.D. during outpatient follow-up. 2. Right posterior scalp laceration/hematoma: No active bleeding. Monitor. 3. Essential hypertension: Continue metoprolol and amlodipine. Uncontrolled. 4. Hyponatremia/SIADH: Upon chart review, seems chronic in nature. Serum osmolarity: 262 and urine osmolarity 507. Fluid restriction and follow BMP as outpatient. 5. History of MVP and palpitations: Check 2-D echo-pending and continue telemetry. 2 week event monitor at discharge. 6. History of carotid artery occlusion: Discussed results of carotid Doppler with vascular surgeon on call on 3/14:  Recommended that patient likely has asymptomatic moderate right ICA stenosis and in the absence of focal signs or symptoms, no further workup or intervention at this time. Recommended repeat carotid Doppler in 6 months. 7. Anxiety and depression: Continue sertraline.   Code Status: Full Family Communication: None at bedside. Disposition Plan: DC home pending completion of workup.   Consultants:  None  Procedures:  None  Antibiotics:  None   Subjective: Denies complaints.  Objective: Filed Vitals:   12/02/14 1124 12/02/14 1423 12/02/14 2043 12/03/14 0500  BP: 176/77 159/75 131/75 141/64  Pulse: 84 75 81 65  Temp: 98.4 F (36.9 C) 97.8 F (36.6 C) 98.4 F (36.9 C) 97.8 F (36.6 C)  TempSrc: Oral  Oral Oral  Resp: 16 16 18 18   Height:      Weight:      SpO2: 99% 99% 95% 100%    Intake/Output Summary (Last 24 hours) at 12/03/14 1502 Last data filed at 12/02/14 1825  Gross per 24 hour  Intake    118 ml  Output    500 ml  Net   -382 ml   Filed Weights   12/02/14 0659  Weight: 39.009 kg (86 lb)     Exam:  General exam: Pleasant elderly frail female sitting up in bed eating breakfast this morning. Respiratory system: Clear. No increased work of breathing. Cardiovascular system: S1 & S2 heard, RRR. No JVD, murmurs, gallops, clicks or pedal edema. Telemetry: Sinus rhythm. Gastrointestinal system: Abdomen is nondistended, soft and nontender. Normal bowel sounds heard. Central nervous system: Alert and oriented. No focal neurological deficits. Extremities: Symmetric 5 x 5 power. HEENT: Approximately 3 cm linear right scalp laceration-not actively bleeding and no acute findings. PERTLA.   Data Reviewed: Basic Metabolic Panel:  Recent Labs Lab 12/02/14 0030 12/02/14 0034  12/03/14 0530  NA 129* 127* 129*  K 4.4 5.4* 4.2  CL 92* 92* 95*  CO2 30  --  29  GLUCOSE 111* 107* 84  BUN 6 7 12   CREATININE 0.52 0.40* 0.58  CALCIUM 9.5  --  8.8   Liver Function  Tests:  Recent Labs Lab 12/02/14 0030  AST 27  ALT 15  ALKPHOS 135*  BILITOT 0.8  PROT 7.1  ALBUMIN 3.9   No results for input(s): LIPASE, AMYLASE in the last 168 hours. No results for input(s): AMMONIA in the last 168 hours. CBC:  Recent Labs Lab 12/02/14 0030 12/02/14 0034 12/03/14 0530  WBC 9.9  --  7.1  NEUTROABS 7.9*  --   --   HGB 12.9 14.3 11.8*  HCT 36.1 42.0 33.9*  MCV 85.1  --  88.1  PLT 240  --  249   Cardiac Enzymes:  Recent Labs Lab 12/02/14 0827 12/02/14 1240 12/02/14 1913  TROPONINI <0.03 <0.03 <0.03   BNP (last 3 results) No results for input(s): PROBNP in the last 8760 hours. CBG:  Recent Labs Lab 12/02/14 0145  GLUCAP 110*    No results found for this or any previous visit (from the past 240 hour(s)).         Studies: Ct Head Wo Contrast  12/02/2014   CLINICAL DATA:  81 year old female with syncope and loss of consciousness while standing in her kitchen. Fall backwards hitting head. Large hematoma on posterior head.  EXAM: CT HEAD WITHOUT CONTRAST  TECHNIQUE: Contiguous axial images were obtained from the base of the skull through the vertex without intravenous contrast.  COMPARISON:  Head CT 02/11/2014  FINDINGS: Stable atrophy and chronic small vessel ischemic change No intracranial hemorrhage, mass effect, or midline shift. No hydrocephalus. The basilar cisterns are patent. No evidence of territorial infarct. No intracranial fluid collection. There is a moderate right subgaleal hematoma extending from the ear frontal to the occipital lobe. No calvarial fracture. Included paranasal sinuses and mastoid air cells are well aerated.  IMPRESSION: 1. Right posterior scalp hematoma. No intracranial abnormality or calvarial fracture. 2. Stable atrophy and chronic small vessel ischemic change.   Electronically Signed   By: Jeb Levering M.D.   On: 12/02/2014 04:29        Scheduled Meds: . amLODipine  2.5 mg Oral Daily  . aspirin  81 mg  Oral Daily  . cholecalciferol  3,000 Units Oral q morning - 10a  . feeding supplement (ENSURE COMPLETE)  237 mL Oral BID BM  . gabapentin  800 mg Oral TID  . metoprolol tartrate  12.5 mg Oral BID  . sertraline  25 mg Oral Daily  . sodium chloride  3 mL Intravenous Q12H  . sodium chloride  3 mL Intravenous Q12H   Continuous Infusions:    Principal Problem:   Syncope and collapse Active Problems:   MVP (mitral valve prolapse)   Protein-calorie malnutrition, severe   Traumatic hematoma of scalp   Hyponatremia   Hyperlipidemia   COPD (chronic obstructive pulmonary disease)   HTN (hypertension)    Time spent: 25 minutes.    Vernell Leep, MD, FACP, FHM. [facility] Pager [phone]  If 7PM-7AM, please contact night-coverage [email or web address] Password [facility] 12/03/2014, 3:02 PM

## 2014-12-03 NOTE — Discharge Summary (Signed)
Physician Discharge Summary  Jenny Torres BTD:[id number] DOB: 14-Feb-1928 DOA: 12/01/2014  PCP: Darlin Coco, MD  Admit date: 12/01/2014 Discharge date: 12/03/2014  Time spent: Less than 30 minutes  Recommendations for Outpatient Follow-up:  1. Dr. Darlin Coco, PCP/cardiology in 5-7 days with repeat labs (CBC & BMP). Cardiologist's office will also arrange for outpatient event monitor. 2. Patient advised to use her walker at home. 3. Patient and family advised that she should not drive until cleared by M.D. 4. Recommend repeating carotid Doppler in 6 months.  Discharge Diagnoses:  Principal Problem:   Syncope and collapse Active Problems:   MVP (mitral valve prolapse)   Protein-calorie malnutrition, severe   Traumatic hematoma of scalp   Hyponatremia   Hyperlipidemia   COPD (chronic obstructive pulmonary disease)   HTN (hypertension)   Discharge Condition: Improved & Stable  Diet recommendation: Heart healthy diet.  Filed Weights   12/02/14 0659  Weight: 39.009 kg (86 lb)    History of present illness:  81 year old female with history of MVP, palpitations, COPD, HTN, anxiety & depression, HLD, carotid artery occlusion, B-12 deficiency, peripheral neuropathy, Raynaud's phenomenon, resident of an apartment, ambulates with the help of a walker, was in her usual state of health until 7 PM on 3/12 when she got up to either put some food or take out from microwave and abruptly passed out without warning symptoms and remembers waking up on the floor. She had hit her head to the floor. She denies dizziness, lightheadedness, chest pain or palpitations. Oral intake has been usual. She denies prior such episodes. In the ED, sodium 129 and CT head without acute findings.  Hospital Course:    1. Syncope and collapse: Unclear etiology. No warning signs. DD-orthostatic hypotension, vasovagal, arrhythmias, structural heart lesions versus other etiology. Was mildly orthostatic on  3/13 which has since resolved. Sustained occipital scalp laceration-no bleeding. EKG: Sinus rhythm without acute changes. QTC 452 ms. Carotid duplex results and recommendations as below. Discussed with primary cardiologist: We'll need 2 week event monitor at discharge- called cardiology service to arrange. No driving for 6 months or until cleared by M.D. during outpatient follow-up-patient and daughter where counseled. 2-D echo: Unremarkable. Patient is advised to use her walker at home (as per daughter patient does not use it consistently). Physical therapy evaluated and no PT needs were identified. 2. Right posterior scalp laceration/hematoma: No active bleeding. Monitor. 3. Essential hypertension: Continue metoprolol and amlodipine. Mildly uncontrolled. Outpatient follow-up. 4. Hyponatremia/SIADH: Upon chart review, seems chronic in nature. Serum osmolarity: 262 and urine osmolarity 507. Fluid restriction advised and follow BMP as outpatient. 5. History of MVP and palpitations: 2 week event monitor at discharge. 2-D echo unremarkable. 6. History of carotid artery occlusion: Discussed results of carotid Doppler with vascular surgeon on call on 3/14: Recommended that patient likely has asymptomatic moderate right ICA stenosis and in the absence of focal signs or symptoms, no further workup or intervention at this time. Recommended repeat carotid Doppler in 6 months. 7. Anxiety and depression: Continue sertraline. As per pharmacy recommendations, gabapentin dose reduced due to age, size and renal function. 8. Mild anemia: No reported significant bleeding. Follow CBC as outpatient in a few days.  Consultations:  None  Procedures:  2-D echo 12/03/14: Study Conclusions  - Left ventricle: The cavity size was normal. Wall thickness was increased in a pattern of moderate LVH. Systolic function was normal. The estimated ejection fraction was in the range of 60% to 65%. Wall motion was normal; there  were no regional wall motion abnormalities. - Aortic valve: There was mild regurgitation. - Mitral valve: Mildly calcified, mildly to moderately thickened annulus. There was mild regurgitation.   Carotid Dopplers 12/03/14: Summary:  - RT ICA/CCA ratio: 2.85 60-79% RT ICA stenosis.  LT ICA/CCA ratio: 1.12 1-39% LT ICA stenosis.  Patent vertebral arteries with antegrade flow bilaterally. - Large calcified plaque with shadowing proximal right ICA with vascular stenosis.   Discharge Exam:  Complaints:  Denies complaints. Anxious to go home.  Filed Vitals:   12/02/14 1423 12/02/14 2043 12/03/14 0500 12/03/14 1608  BP: 159/75 131/75 141/64 151/69  Pulse: 75 81 65 73  Temp: 97.8 F (36.6 C) 98.4 F (36.9 C) 97.8 F (36.6 C)   TempSrc:  Oral Oral   Resp: 16 18 18    Height:      Weight:      SpO2: 99% 95% 100%     General exam: Pleasant elderly frail female sitting up in bed eating breakfast this morning. Respiratory system: Clear. No increased work of breathing. Cardiovascular system: S1 & S2 heard, RRR. No JVD, murmurs, gallops, clicks or pedal edema. Telemetry: Sinus rhythm. Gastrointestinal system: Abdomen is nondistended, soft and nontender. Normal bowel sounds heard. Central nervous system: Alert and oriented. No focal neurological deficits. Extremities: Symmetric 5 x 5 power. HEENT: Approximately 3 cm linear right scalp laceration-not actively bleeding and no acute findings. PERTLA.  Discharge Instructions      Discharge Instructions    Call MD for:  extreme fatigue    Complete by:  As directed      Call MD for:  persistant dizziness or light-headedness    Complete by:  As directed      Call MD for:    Complete by:  As directed   Passing out.     Diet - low sodium heart healthy    Complete by:  As directed      Driving Restrictions    Complete by:  As directed   Patient and family have been instructed that patient should not drive until  cleared by MD.     Increase activity slowly    Complete by:  As directed             Medication List    STOP taking these medications        azithromycin 250 MG tablet  Commonly known as:  ZITHROMAX     DSS 100 MG Caps     enoxaparin 30 MG/0.3ML injection  Commonly known as:  LOVENOX     LORazepam 1 MG tablet  Commonly known as:  ATIVAN      TAKE these medications        acetaminophen 325 MG tablet  Commonly known as:  TYLENOL  Take 2 tablets (650 mg total) by mouth every 6 (six) hours as needed for mild pain (or Fever >/= 101).     albuterol 108 (90 BASE) MCG/ACT inhaler  Commonly known as:  PROVENTIL HFA;VENTOLIN HFA  Inhale 1-2 puffs into the lungs every 6 (six) hours as needed for wheezing or shortness of breath.     amLODipine 2.5 MG tablet  Commonly known as:  NORVASC  Take 1 tablet (2.5 mg total) by mouth daily.     aspirin 81 MG tablet  Take 81 mg by mouth daily.     feeding supplement (ENSURE COMPLETE) Liqd  Take 237 mLs by mouth 2 (two) times daily between meals.     gabapentin 800 MG  tablet  Commonly known as:  NEURONTIN  Take 1 tablet (800 mg total) by mouth 2 (two) times daily.     metoprolol tartrate 25 MG tablet  Commonly known as:  LOPRESSOR  TAKE ONE-HALF (1/2) TABLET TWICE A DAY     sertraline 25 MG tablet  Commonly known as:  ZOLOFT  Take 1 tablet (25 mg total) by mouth daily.     Vitamin D3 1000 UNITS Caps  Take 1 capsule by mouth as needed.     zolpidem 5 MG tablet  Commonly known as:  AMBIEN  Take 1 tablet (5 mg total) by mouth at bedtime as needed for sleep.       Follow-up Information    Follow up with Darlin Coco, MD. Schedule an appointment as soon as possible for a visit in 5 days.   Specialty:  Cardiology   Why:  To be seen with repeat labs (CBC & BMP). MD's office will call to arrange heart monitor-please call if you don't hear from them in the next 2-3 days.   Contact information:   [address] [place] [address] [phone]        The results of significant diagnostics from this hospitalization (including imaging, microbiology, ancillary and laboratory) are listed below for reference.    Significant Diagnostic Studies: Ct Head Wo Contrast  12/02/2014   CLINICAL DATA:  81 year old female with syncope and loss of consciousness while standing in her kitchen. Fall backwards hitting head. Large hematoma on posterior head.  EXAM: CT HEAD WITHOUT CONTRAST  TECHNIQUE: Contiguous axial images were obtained from the base of the skull through the vertex without intravenous contrast.  COMPARISON:  Head CT 02/11/2014  FINDINGS: Stable atrophy and chronic small vessel ischemic change No intracranial hemorrhage, mass effect, or midline shift. No hydrocephalus. The basilar cisterns are patent. No evidence of territorial infarct. No intracranial fluid collection. There is a moderate right subgaleal hematoma extending from the ear frontal to the occipital lobe. No calvarial fracture. Included paranasal sinuses and mastoid air cells are well aerated.  IMPRESSION: 1. Right posterior scalp hematoma. No intracranial abnormality or calvarial fracture. 2. Stable atrophy and chronic small vessel ischemic change.   Electronically Signed   By: Jeb Levering M.D.   On: 12/02/2014 04:29    Microbiology: No results found for this or any previous visit (from the past 240 hour(s)).   Labs: Basic Metabolic Panel:  Recent Labs Lab 12/02/14 0030 12/02/14 0034 12/03/14 0530  NA 129* 127* 129*  K 4.4 5.4* 4.2  CL 92* 92* 95*  CO2 30  --  29  GLUCOSE 111* 107* 84  BUN 6 7 12   CREATININE 0.52 0.40* 0.58  CALCIUM 9.5  --  8.8   Liver Function Tests:  Recent Labs Lab 12/02/14 0030  AST 27  ALT 15  ALKPHOS 135*  BILITOT 0.8  PROT 7.1  ALBUMIN 3.9   No results for input(s): LIPASE, AMYLASE in the last 168 hours. No results for input(s): AMMONIA in the last 168 hours. CBC:  Recent  Labs Lab 12/02/14 0030 12/02/14 0034 12/03/14 0530  WBC 9.9  --  7.1  NEUTROABS 7.9*  --   --   HGB 12.9 14.3 11.8*  HCT 36.1 42.0 33.9*  MCV 85.1  --  88.1  PLT 240  --  249   Cardiac Enzymes:  Recent Labs Lab 12/02/14 0827 12/02/14 1240 12/02/14 1913  TROPONINI <0.03 <0.03 <0.03   BNP: BNP (  last 3 results) No results for input(s): BNP in the last 8760 hours.  ProBNP (last 3 results) No results for input(s): PROBNP in the last 8760 hours.  CBG:  Recent Labs Lab 12/02/14 0145  GLUCAP 110*       Signed:  Vernell Leep, MD, FACP, FHM. [facility] Pager [phone]  If 7PM-7AM, please contact night-coverage [email or web address] Password [facility] 12/03/2014, 5:27 PM

## 2014-12-03 NOTE — Discharge Instructions (Signed)
Syncope °Syncope is a medical term for fainting or passing out. This means you lose consciousness and drop to the ground. People are generally unconscious for less than 5 minutes. You may have some muscle twitches for up to 15 seconds before waking up and returning to normal. Syncope occurs more often in older adults, but it can happen to anyone. While most causes of syncope are not dangerous, syncope can be a sign of a serious medical problem. It is important to seek medical care.  °CAUSES  °Syncope is caused by a sudden drop in blood flow to the brain. The specific cause is often not determined. Factors that can bring on syncope include: °· Taking medicines that lower blood pressure. °· Sudden changes in posture, such as standing up quickly. °· Taking more medicine than prescribed. °· Standing in one place for too long. °· Seizure disorders. °· Dehydration and excessive exposure to heat. °· Low blood sugar (hypoglycemia). °· Straining to have a bowel movement. °· Heart disease, irregular heartbeat, or other circulatory problems. °· Fear, emotional distress, seeing blood, or severe pain. °SYMPTOMS  °Right before fainting, you may: °· Feel dizzy or light-headed. °· Feel nauseous. °· See all white or all black in your field of vision. °· Have cold, clammy skin. °DIAGNOSIS  °Your health care provider will ask about your symptoms, perform a physical exam, and perform an electrocardiogram (ECG) to record the electrical activity of your heart. Your health care provider may also perform other heart or blood tests to determine the cause of your syncope which may include: °· Transthoracic echocardiogram (TTE). During echocardiography, sound waves are used to evaluate how blood flows through your heart. °· Transesophageal echocardiogram (TEE). °· Cardiac monitoring. This allows your health care provider to monitor your heart rate and rhythm in real time. °· Holter monitor. This is a portable device that records your  heartbeat and can help diagnose heart arrhythmias. It allows your health care provider to track your heart activity for several days, if needed. °· Stress tests by exercise or by giving medicine that makes the heart beat faster. °TREATMENT  °In most cases, no treatment is needed. Depending on the cause of your syncope, your health care provider may recommend changing or stopping some of your medicines. °HOME CARE INSTRUCTIONS °· Have someone stay with you until you feel stable. °· Do not drive, use machinery, or play sports until your health care provider says it is okay. °· Keep all follow-up appointments as directed by your health care provider. °· Lie down right away if you start feeling like you might faint. Breathe deeply and steadily. Wait until all the symptoms have passed. °· Drink enough fluids to keep your urine clear or pale yellow. °· If you are taking blood pressure or heart medicine, get up slowly and take several minutes to sit and then stand. This can reduce dizziness. °SEEK IMMEDIATE MEDICAL CARE IF:  °· You have a severe headache. °· You have unusual pain in the chest, abdomen, or back. °· You are bleeding from your mouth or rectum, or you have black or tarry stool. °· You have an irregular or very fast heartbeat. °· You have pain with breathing. °· You have repeated fainting or seizure-like jerking during an episode. °· You faint when sitting or lying down. °· You have confusion. °· You have trouble walking. °· You have severe weakness. °· You have vision problems. °If you fainted, call your local emergency services (911 in [place] Do not drive   yourself to the hospital.  °MAKE SURE YOU: °· Understand these instructions. °· Will watch your condition. °· Will get help right away if you are not doing well or get worse. °Document Released: 09/07/2005 Document Revised: 09/12/2013 Document Reviewed: 11/06/2011 °[employer]® Patient Information ©2015 [employer]. This information is not intended to replace  advice given to you by your health care provider. Make sure you discuss any questions you have with your health care provider. ° °

## 2014-12-04 ENCOUNTER — Telehealth: Payer: Self-pay | Admitting: Cardiology

## 2014-12-04 DIAGNOSIS — R55 Syncope and collapse: Secondary | ICD-10-CM

## 2014-12-04 NOTE — Telephone Encounter (Signed)
Left message to call back  TCM patient

## 2014-12-04 NOTE — Telephone Encounter (Signed)
New message      Pt was released from the hosp yesterday.  Talk to Rip Harbour to get what Dr Mare Ferrari want to do next

## 2014-12-05 ENCOUNTER — Encounter (INDEPENDENT_AMBULATORY_CARE_PROVIDER_SITE_OTHER): Payer: Medicare Other

## 2014-12-05 ENCOUNTER — Encounter: Payer: Self-pay | Admitting: *Deleted

## 2014-12-05 DIAGNOSIS — R55 Syncope and collapse: Secondary | ICD-10-CM | POA: Diagnosis not present

## 2014-12-05 NOTE — Progress Notes (Signed)
Patient ID: Jenny Torres, female   DOB: July 09, 1928, 81 y.o.   MRN: [id number] [employer] 14 day cardiac event monitor applied to patient.

## 2014-12-06 NOTE — Telephone Encounter (Signed)
Patient contacted regarding discharge from [facility] on 12/03/14 .  Patient understands to follow up with provider Tera Helper NP on 12/10/14 at 1:30 at [facility] office. Patient understands discharge instructions? yes Patient understands medications and regiment? yes Patient understands to bring all medications to this visit?    Patient came for monitor and is just feeling tired. No medication changes made at discharge

## 2014-12-10 ENCOUNTER — Encounter: Payer: Self-pay | Admitting: Nurse Practitioner

## 2014-12-10 ENCOUNTER — Encounter: Payer: Self-pay | Admitting: *Deleted

## 2014-12-10 ENCOUNTER — Ambulatory Visit (INDEPENDENT_AMBULATORY_CARE_PROVIDER_SITE_OTHER): Payer: Medicare Other | Admitting: Nurse Practitioner

## 2014-12-10 VITALS — BP 138/80 | HR 64 | Ht 62.0 in | Wt 88.0 lb

## 2014-12-10 DIAGNOSIS — R55 Syncope and collapse: Secondary | ICD-10-CM

## 2014-12-10 LAB — BASIC METABOLIC PANEL
BUN: 10 mg/dL (ref 6–23)
CO2: 31 mEq/L (ref 19–32)
Calcium: 9.6 mg/dL (ref 8.4–10.5)
Chloride: 92 mEq/L — ABNORMAL LOW (ref 96–112)
Creatinine, Ser: 0.45 mg/dL (ref 0.40–1.20)
GFR: 140.82 mL/min (ref >60.00)
Glucose, Bld: 82 mg/dL (ref 70–99)
Potassium: 4.4 mEq/L (ref 3.5–5.1)
Sodium: 127 mEq/L — ABNORMAL LOW (ref 135–145)

## 2014-12-10 NOTE — Progress Notes (Signed)
Patient ID: Jenny Torres, female   DOB: 1928/06/17, 81 y.o.   MRN: [id number] 14 day [employer] cardiac event monitor extended to 30 days per Truitt Merle, NP.  [employer] notified. Estimated EOS 01/03/2015.

## 2014-12-10 NOTE — Progress Notes (Signed)
CARDIOLOGY OFFICE NOTE  Date:  12/10/2014    [name] Date of Birth: November 14, 1927 Medical Record #[id number]  PCP:  Darlin Coco, MD  Cardiologist:  Mare Ferrari    Chief Complaint  Patient presents with  . Post hospital visit for syncope    Seen for Dr. Mare Ferrari    History of Present Illness: JORDAIN RADIN is a 81 y.o. female who presents today for a post hospital visit. She is seen for Dr. Mare Ferrari. She has a history of MVP, palpitations, COPD, HTN, anxiety & depression, HLD, carotid artery occlusion, B-12 deficiency, peripheral neuropathy and Raynaud's phenomenon.  She has had situational depression related to ongoing stress regarding her husband's dementia. Also her son was recently diagnosed with ALS. She has a history of allergies and past history of asthma. She has a past history of low vitamin B12 levels first noted by her neurologist at [facility]. She has vitamin B12 injections on hand at home but has not taken any for the past 6 months or more. She has had difficulty in gaining weight.  The patient fell on 02/11/2014 and broke her right hip. She underwent pinning of her hip by Dr. Doran Durand. She spent time in [place]  and then has moved to [place] in an apartment.   She was last seen here back at the end of February. Felt to be stable from our standpoint.   Presented to [facility] on 3/12 when she got up to either put some food or take out from microwave and abruptly passed out without warning symptoms and remembers waking up on the floor. She had hit her head on the floor. In the ED, sodium 129 and CT head without acute findings. Unclear etiology. Was to have a 2 week event monitor at discharge. Echo was unremarkable. Does have carotid disease and will need follow up study in 6 months.   Comes back today. Here alone. Just remembers "lying on the floor".  Past Medical History  Diagnosis Date  . Anxiety   . Depression   . COPD (chronic obstructive pulmonary  disease)   . HTN (hypertension)   . Gastritis   . Polyp of colon   . Hemorrhoids, external   . Diverticula, colon   . GERD (gastroesophageal reflux disease)   . Carotid artery occlusion   . Idiopathic peripheral neuropathy     chronic  . Hyperlipidemia   . MVP (mitral valve prolapse)     stable  . Palpitations   . Osteoporosis   . Hyperlipidemia   . Raynaud's disease   . Anorexia     Past Surgical History  Procedure Laterality Date  . Cholecystectomy    . C-sections      x2  . Intramedullary (im) nail intertrochanteric Right 02/11/2014    Procedure: INTRAMEDULLARY (IM) NAIL INTERTROCHANTRIC;  Surgeon: [name], MD;  Location: [place];  Service: Orthopedics;  Laterality: Right;     Medications: Current Outpatient Prescriptions  Medication Sig Dispense Refill  . acetaminophen (TYLENOL) 325 MG tablet Take 2 tablets (650 mg total) by mouth every 6 (six) hours as needed for mild pain (or Fever >/= 101). 30 tablet 0  . albuterol (PROVENTIL HFA;VENTOLIN HFA) 108 (90 BASE) MCG/ACT inhaler Inhale 1-2 puffs into the lungs every 6 (six) hours as needed for wheezing or shortness of breath.    Marland Kitchen amLODipine (NORVASC) 2.5 MG tablet Take 1 tablet (2.5 mg total) by mouth daily. 30 tablet 5  . aspirin 81 MG tablet  Take 81 mg by mouth daily.      . Cholecalciferol (VITAMIN D3) 1000 UNITS CAPS Take 1 capsule by mouth as needed.     . feeding supplement, ENSURE COMPLETE, (ENSURE COMPLETE) LIQD Take 237 mLs by mouth 2 (two) times daily between meals.    . gabapentin (NEURONTIN) 800 MG tablet Take 1 tablet (800 mg total) by mouth 2 (two) times daily.    . metoprolol tartrate (LOPRESSOR) 25 MG tablet TAKE ONE-HALF (1/2) TABLET TWICE A DAY 90 tablet 0  . sertraline (ZOLOFT) 25 MG tablet Take 1 tablet (25 mg total) by mouth daily. 90 tablet 3  . zolpidem (AMBIEN) 5 MG tablet Take 1 tablet (5 mg total) by mouth at bedtime as needed for sleep. 90 tablet 1   No current facility-administered  medications for this visit.    Allergies: Allergies  Allergen Reactions  . Lovastatin Other (See Comments)    unknown  . Sulfamethoxazole Swelling    Swelling of the throat, mouth, tongue, nose  . Darvocet [Propoxyphene N-Acetaminophen] Itching  . Erythromycin Nausea And Vomiting    Upset stomach  . Hydrocod Polst-Cpm Polst Er Nausea Only    nausea  . Penicillins Other (See Comments)    unknown  . Theophyllines Other (See Comments)    Unknown reaction  . Zovirax [Acyclovir] Other (See Comments)    Per patient lots of side effects  . Ceftin Diarrhea    GI problems  . Paroxetine Hcl Other (See Comments)    jittery    Social History: The patient  reports that she quit smoking about 31 years ago. Her smoking use included Cigarettes. She has a 25 pack-year smoking history. She has never used smokeless tobacco. She reports that she drinks alcohol. She reports that she does not use illicit drugs.   Family History: The patient's family history includes Breast cancer in her mother; Cirrhosis in her brother; Liver disease in her brother.   Review of Systems: Please see the history of present illness.   Otherwise, the review of systems is positive for generalized soreness.   All other systems are reviewed and negative.   Physical Exam: VS:  BP 138/80 mmHg  Pulse 64  Ht 5\' 2"  (1.575 m)  Wt 88 lb (39.917 kg)  BMI 16.09 kg/m2 .  BMI Body mass index is 16.09 kg/(m^2).  Wt Readings from Last 3 Encounters:  12/10/14 88 lb (39.917 kg)  12/02/14 86 lb (39.009 kg)  11/19/14 88 lb 12.8 oz (40.279 kg)    General: Pleasant. Very thin but in no acute distress.  HEENT: Normal. Neck: Supple, no JVD, carotid bruits, or masses noted.  Cardiac: Regular rate and rhythm. No murmurs, rubs, or gallops. No edema.  Respiratory:  Lungs are clear to auscultation bilaterally with normal work of breathing.  GI: Soft and nontender.  MS: No deformity or atrophy. Gait and ROM intact. Skin: Warm and  dry. Color is normal. Diffuse bruising over her posterior neck, right eye. Several scratch marks as well. Neuro:  Strength and sensation are intact and no gross focal deficits noted.  Psych: Alert, appropriate and with normal affect.   LABORATORY DATA:  EKG:  EKG is not ordered today.  Lab Results  Component Value Date   WBC 7.1 12/03/2014   HGB 11.8* 12/03/2014   HCT 33.9* 12/03/2014   PLT 249 12/03/2014   GLUCOSE 84 12/03/2014   CHOL 189 11/20/2014   TRIG 60.0 11/20/2014   HDL 71.50 11/20/2014   LDLDIRECT  112.9 08/28/2013   LDLCALC 106* 11/20/2014   ALT 15 12/02/2014   AST 27 12/02/2014   NA 129* 12/03/2014   K 4.2 12/03/2014   CL 95* 12/03/2014   CREATININE 0.58 12/03/2014   BUN 12 12/03/2014   CO2 29 12/03/2014   TSH 0.92 07/12/2008   INR 0.99 02/11/2014    BNP (last 3 results) No results for input(s): BNP in the last 8760 hours.  ProBNP (last 3 results) No results for input(s): PROBNP in the last 8760 hours.   Other Studies Reviewed Today:    2-D echo 12/03/14: Study Conclusions  - Left ventricle: The cavity size was normal. Wall thickness was increased in a pattern of moderate LVH. Systolic function was normal. The estimated ejection fraction was in the range of 60% to 65%. Wall motion was normal; there were no regional wall motion abnormalities. - Aortic valve: There was mild regurgitation. - Mitral valve: Mildly calcified, mildly to moderately thickened annulus. There was mild regurgitation.     Carotid Dopplers 12/03/14: Summary:  - RT ICA/CCA ratio: 2.85 60-79% RT ICA stenosis.  LT ICA/CCA ratio: 1.12 1-39% LT ICA stenosis.  Patent vertebral arteries with antegrade flow bilaterally. - Large calcified plaque with shadowing proximal right ICA with vascular stenosis.   Assessment/Plan: 1. Syncope - unclear as to the etiology. Sounds like she had no warning whatsoever. Normal EF on echo. Has event monitor - so far  nothing significant noted. Will increase duration for the monitor to a total of 30 days. Needs follow up with Dr. Mare Ferrari. I have discussed her care with Dr. Rayann Heman here - he notes that if this is negative, she could be referred for Loop implant. She is reminded about not driving. Recheck BMET today.   2. HTN -  BP ok.   3. Carotid disease - needs repeat study in October  Current medicines are reviewed with the patient today.  The patient does not have concerns regarding medicines other than what has been noted above.  The following changes have been made:  See above.  Labs/ tests ordered today include:   No orders of the defined types were placed in this encounter.     Disposition:   FU with Dr. Mare Ferrari in 2 weeks    Patient is agreeable to this plan and will call if any problems develop in the interim.   Signed: Burtis Junes, RN, ANP-C 12/10/2014 2:09 PM  [facility] [address] [place], [place]  [address] Phone: [phone] Fax: [phone]

## 2014-12-10 NOTE — Patient Instructions (Addendum)
We will be checking the following labs today BMET  Stay on your current medicines  See Dr. Mare Ferrari in 2 weeks  We will extend the heart monitor for a total of 30 days  Call the [place] office at [phone] if you have any questions, problems or concerns.

## 2014-12-28 ENCOUNTER — Ambulatory Visit (INDEPENDENT_AMBULATORY_CARE_PROVIDER_SITE_OTHER): Payer: Medicare Other | Admitting: Cardiology

## 2014-12-28 ENCOUNTER — Encounter: Payer: Self-pay | Admitting: Cardiology

## 2014-12-28 VITALS — BP 136/72 | HR 64 | Ht 62.0 in | Wt 91.8 lb

## 2014-12-28 DIAGNOSIS — E871 Hypo-osmolality and hyponatremia: Secondary | ICD-10-CM

## 2014-12-28 DIAGNOSIS — G629 Polyneuropathy, unspecified: Secondary | ICD-10-CM | POA: Diagnosis not present

## 2014-12-28 DIAGNOSIS — R55 Syncope and collapse: Secondary | ICD-10-CM | POA: Diagnosis not present

## 2014-12-28 DIAGNOSIS — E785 Hyperlipidemia, unspecified: Secondary | ICD-10-CM

## 2014-12-28 LAB — BASIC METABOLIC PANEL
BUN: 8 mg/dL (ref 6–23)
CALCIUM: 9.4 mg/dL (ref 8.4–10.5)
CO2: 29 mEq/L (ref 19–32)
Chloride: 93 mEq/L — ABNORMAL LOW (ref 96–112)
Creatinine, Ser: 0.5 mg/dL (ref 0.40–1.20)
GFR: 124.68 mL/min (ref >60.00)
Glucose, Bld: 66 mg/dL — ABNORMAL LOW (ref 70–99)
Potassium: 3.8 mEq/L (ref 3.5–5.1)
Sodium: 128 mEq/L — ABNORMAL LOW (ref 135–145)

## 2014-12-28 LAB — CBC WITH DIFFERENTIAL/PLATELET
BASOS PCT: 0.7 % (ref 0.0–3.0)
Basophils Absolute: 0.1 10*3/uL (ref 0.0–0.1)
EOS ABS: 0.5 10*3/uL (ref 0.0–0.7)
EOS PCT: 5.8 % — AB (ref 0.0–5.0)
HCT: 36.7 % (ref 36.0–46.0)
Hemoglobin: 12.6 g/dL (ref 12.0–15.0)
LYMPHS PCT: 21.7 % (ref 12.0–46.0)
Lymphs Abs: 1.8 10*3/uL (ref 0.7–4.0)
MCHC: 34.2 g/dL (ref 30.0–36.0)
MCV: 90.1 fl (ref 78.0–100.0)
Monocytes Absolute: 0.8 10*3/uL (ref 0.1–1.0)
Monocytes Relative: 9.4 % (ref 3.0–12.0)
NEUTROS ABS: 5.1 10*3/uL (ref 1.4–7.7)
Neutrophils Relative %: 62.4 % (ref 43.0–77.0)
Platelets: 268 10*3/uL (ref 150.0–400.0)
RBC: 4.08 Mil/uL (ref 3.87–5.11)
RDW: 13.5 % (ref 11.5–15.5)
WBC: 8.1 10*3/uL (ref 4.0–10.5)

## 2014-12-28 LAB — TSH: TSH: 2.17 u[IU]/mL (ref 0.35–4.50)

## 2014-12-28 NOTE — Progress Notes (Signed)
Cardiology Office Note   Date:  12/28/2014   ID:  Ileene, Allie 04/09/1928, MRN [id number]  PCP:  Darlin Coco, MD  Cardiologist:   Darlin Coco, MD   No chief complaint on file.     History of Present Illness: Jenny Torres is a 81 y.o. female who presents for follow-up office visit  Jenny Torres is a 81 y.o. female who presents today for a post hospital visit.  She has a history of MVP, palpitations, COPD, HTN, anxiety & depression, HLD, carotid artery occlusion, B-12 deficiency, peripheral neuropathy and Raynaud's phenomenon. She has had situational depression related to ongoing stress regarding her husband's dementia. Also her son was recently diagnosed with ALS. She has a history of allergies and past history of asthma. She has a past history of low vitamin B12 levels first noted by her neurologist at [facility]. She has vitamin B12 injections on hand at home but has not taken any for the past 6 months or more. She has had difficulty in gaining weight.  The patient fell on 02/11/2014 and broke her right hip. She underwent pinning of her hip by Dr. Doran Durand. She spent time in [place] and then has moved to [place] in an apartment. Her children insisted that she move out of her home.  She was last seen here back at the end of February. Felt to be stable from our standpoint.   Presented to [facility] on 3/12 when she got up to either put some food or take out from microwave and abruptly passed out without warning symptoms and remembers waking up on the floor. She had hit her head on the floor. In the ED, sodium 129 and CT head without acute findings. Unclear etiology. Was to have a 2 week event monitor at discharge. Echo was unremarkable. Does have carotid disease and will need follow up study in 6 months.  The carotid Dopplers done on 12/03/14 showed a 60-79% right carotid artery stenosis.  Less than 40% on the left. She has been having problems with dry skin.  She  has not had a TSH since 2009.  We will get one today.  Past Medical History  Diagnosis Date  . Anxiety   . Depression   . COPD (chronic obstructive pulmonary disease)   . HTN (hypertension)   . Gastritis   . Polyp of colon   . Hemorrhoids, external   . Diverticula, colon   . GERD (gastroesophageal reflux disease)   . Carotid artery occlusion   . Idiopathic peripheral neuropathy     chronic  . Hyperlipidemia   . MVP (mitral valve prolapse)     stable  . Palpitations   . Osteoporosis   . Hyperlipidemia   . Raynaud's disease   . Anorexia     Past Surgical History  Procedure Laterality Date  . Cholecystectomy    . C-sections      x2  . Intramedullary (im) nail intertrochanteric Right 02/11/2014    Procedure: INTRAMEDULLARY (IM) NAIL INTERTROCHANTRIC;  Surgeon: [name], MD;  Location: [place];  Service: Orthopedics;  Laterality: Right;     Current Outpatient Prescriptions  Medication Sig Dispense Refill  . acetaminophen (TYLENOL) 325 MG tablet Take 2 tablets (650 mg total) by mouth every 6 (six) hours as needed for mild pain (or Fever >/= 101). 30 tablet 0  . albuterol (PROVENTIL HFA;VENTOLIN HFA) 108 (90 BASE) MCG/ACT inhaler Inhale 1-2 puffs into the lungs every 6 (six) hours as needed  for wheezing or shortness of breath.    Marland Kitchen amLODipine (NORVASC) 2.5 MG tablet Take 1 tablet (2.5 mg total) by mouth daily. 30 tablet 5  . aspirin 81 MG tablet Take 81 mg by mouth daily.      . Cholecalciferol (VITAMIN D3) 1000 UNITS CAPS Take 1 capsule by mouth as needed.     . feeding supplement, ENSURE COMPLETE, (ENSURE COMPLETE) LIQD Take 237 mLs by mouth 2 (two) times daily between meals.    . gabapentin (NEURONTIN) 800 MG tablet Take 1 tablet (800 mg total) by mouth 2 (two) times daily.    . metoprolol tartrate (LOPRESSOR) 25 MG tablet Take 12.5 mg by mouth 2 (two) times daily.     . sertraline (ZOLOFT) 25 MG tablet Take 1 tablet (25 mg total) by mouth daily. 90 tablet 3  . zolpidem  (AMBIEN) 5 MG tablet Take 1 tablet (5 mg total) by mouth at bedtime as needed for sleep. 90 tablet 1   No current facility-administered medications for this visit.    Allergies:   Lovastatin; Sulfamethoxazole; Darvocet; Erythromycin; Hydrocod polst-cpm polst er; Penicillins; Theophyllines; Zovirax; Ceftin; and Paroxetine hcl    Social History:  The patient  reports that she quit smoking about 31 years ago. Her smoking use included Cigarettes. She has a 25 pack-year smoking history. She has never used smokeless tobacco. She reports that she drinks alcohol. She reports that she does not use illicit drugs.   Family History:  The patient's family history includes Breast cancer in her mother; Cirrhosis in her brother; Liver disease in her brother.    ROS:  Please see the history of present illness.   Otherwise, review of systems are positive for none.   All other systems are reviewed and negative.    PHYSICAL EXAM: VS:  BP 136/72 mmHg  Pulse 64  Ht 5\' 2"  (1.575 m)  Wt 91 lb 12.8 oz (41.64 kg)  BMI 16.79 kg/m2 , BMI Body mass index is 16.79 kg/(m^2). GEN: Well nourished, well developed, in no acute distress HEENT: normal Neck: no JVD, carotid bruits, or masses Cardiac: RRR; no murmurs, rubs, or gallops,no edema  Respiratory:  clear to auscultation bilaterally, normal work of breathing GI: soft, nontender, nondistended, + BS MS: no deformity or atrophy Skin: warm and dry, no rash Neuro:  Strength and sensation are intact Psych: euthymic mood, full affect   EKG:  EKG is not ordered today.    Recent Labs: 12/02/2014: ALT 15 12/03/2014: Hemoglobin 11.8*; Platelets 249 12/10/2014: BUN 10; Creatinine 0.45; Potassium 4.4; Sodium 127*    Lipid Panel    Component Value Date/Time   CHOL 189 11/20/2014 1014   TRIG 60.0 11/20/2014 1014   HDL 71.50 11/20/2014 1014   CHOLHDL 3 11/20/2014 1014   VLDL 12.0 11/20/2014 1014   LDLCALC 106* 11/20/2014 1014   LDLDIRECT 112.9 08/28/2013 1244       Wt Readings from Last 3 Encounters:  12/28/14 91 lb 12.8 oz (41.64 kg)  12/10/14 88 lb (39.917 kg)  12/02/14 86 lb (39.009 kg)         ASSESSMENT AND PLAN:  1. Labile hypertension 2. Hypercholesterolemia 3. peripheral neuropathy possibly secondary to prior statin usage. 4. mitral valve prolapse 5. Raynaud's phenomenon 6. history of right hip fracture with subsequent packed cell transfusion for blood loss anemia 7.  Dry skin.  Rule out underactive thyroid. 8.  Past history of syncope.  No recurrence.  She is presently wearing a 4 week event  monitor.  So far nothing found on the event monitor.  We are checking thyroid functions basal metabolic panel and CBC today.   Current medicines are reviewed at length with the patient today.  The patient does not have concerns regarding medicines.  The following changes have been made:  no change  Labs/ tests ordered today include:   Orders Placed This Encounter  Procedures  . TSH  . Basic metabolic panel  . CBC with Differential/Platelet     Continue on current medication.  Lab work today pending.  Finish up the event monitor.  Continue to add plenty of salt to her food.  She has chronic hyponatremia.  Blood pressure today does not show any orthostatic hypotension. She is unsteady on her feet because of her peripheral neuropathy and leg weakness.  She walks with a walker. She remains under a lot of stress because of her son who has ALS who lives in [place]. Recheck here at her June appointment  Signed, Darlin Coco, MD  12/28/2014 2:08 PM    [facility] [place], [place], [place]  [address] Phone: [phone]; Fax: [phone]

## 2014-12-28 NOTE — Patient Instructions (Addendum)
Medication Instructions:  Your physician recommends that you continue on your current medications as directed. Please refer to the Current Medication list given to you today.  Labwork: BMET/CBC/TSH   Testing/Procedures: NONE  Follow-Up: KEEP June APPOINTMENT   Any Other Special Instructions Will Be Listed Below (If Applicable).

## 2014-12-30 NOTE — Progress Notes (Signed)
Quick Note:  Please report to patient. The recent labs are stable. Continue same medication and careful diet.Hemoglobin is better. Thyroid is okay. Serum sodium slightly better. ______

## 2015-01-09 ENCOUNTER — Telehealth: Payer: Self-pay

## 2015-01-09 NOTE — Telephone Encounter (Signed)
Called patient back about her cardiac event monitor. Per Dr. Mare Ferrari, no atrial fibrillation. Patient verbalized understanding.

## 2015-02-07 ENCOUNTER — Other Ambulatory Visit: Payer: Self-pay | Admitting: *Deleted

## 2015-02-07 ENCOUNTER — Other Ambulatory Visit: Payer: Self-pay | Admitting: Cardiology

## 2015-02-07 MED ORDER — AMLODIPINE BESYLATE 2.5 MG PO TABS: 2.5000 mg | ORAL_TABLET | Freq: Every day | ORAL | Status: DC

## 2015-02-19 ENCOUNTER — Encounter: Payer: Self-pay | Admitting: Podiatry

## 2015-02-19 ENCOUNTER — Ambulatory Visit (INDEPENDENT_AMBULATORY_CARE_PROVIDER_SITE_OTHER): Payer: Medicare Other | Admitting: Podiatry

## 2015-02-19 DIAGNOSIS — M79673 Pain in unspecified foot: Secondary | ICD-10-CM | POA: Diagnosis not present

## 2015-02-19 DIAGNOSIS — B351 Tinea unguium: Secondary | ICD-10-CM | POA: Diagnosis not present

## 2015-02-19 NOTE — Progress Notes (Signed)
Patient ID: Jenny Torres, female   DOB: [birth year], 81 y.o.   MRN: [id number] Complaint:  Visit Type: Patient returns to my office for continued preventative foot care services. Complaint: Patient states" my nails have grown long and thick and become painful to walk and wear shoes" Patient has neuropathy both feet.Marland Kitchen He presents for preventative foot care services. No changes to ROS. Painful corn third toe right foot.  Podiatric Exam: Vascular: dorsalis pedis and posterior tibial pulses are palpable bilateral. Capillary return is immediate. Temperature gradient is WNL. Skin turgor WNL  Sensorium: Abnormal and diminished  Semmes Weinstein monofilament test. Normal tactile sensation bilaterally. Nail Exam: Pt has thick disfigured discolored nails with subungual debris noted bilateral entire nail hallux through fifth toenails Ulcer Exam: There is no evidence of ulcer or pre-ulcerative changes or infection. Orthopedic Exam: Muscle tone and strength are WNL. No limitations in general ROM. No crepitus or effusions noted. Foot type and digits show no abnormalities. Bony prominences are unremarkable. Skin: No Porokeratosis. No infection or ulcers  Diagnosis:  Tinea unguium, Pain in right toe, pain in left toes  Treatment & Plan Procedures and Treatment: Consent by patient was obtained for treatment procedures. The patient understood the discussion of treatment and procedures well. All questions were answered thoroughly reviewed. Debridement of mycotic and hypertrophic toenails, 1 through 5 bilateral and clearing of subungual debris. No ulceration, no infection noted.  Return Visit-Office Procedure: Patient instructed to return to the office for a follow up visit 3 months for continued evaluation and treatment.

## 2015-02-25 ENCOUNTER — Ambulatory Visit: Payer: Medicare Other

## 2015-03-11 ENCOUNTER — Other Ambulatory Visit (INDEPENDENT_AMBULATORY_CARE_PROVIDER_SITE_OTHER): Payer: Medicare Other | Admitting: *Deleted

## 2015-03-11 DIAGNOSIS — E785 Hyperlipidemia, unspecified: Secondary | ICD-10-CM

## 2015-03-11 LAB — HEPATIC FUNCTION PANEL
ALBUMIN: 4 g/dL (ref 3.5–5.2)
ALK PHOS: 112 U/L (ref 39–117)
ALT: 12 U/L (ref 0–35)
AST: 24 U/L (ref 0–37)
Bilirubin, Direct: 0.2 mg/dL (ref 0.0–0.3)
Total Bilirubin: 0.7 mg/dL (ref 0.2–1.2)
Total Protein: 6.7 g/dL (ref 6.0–8.3)

## 2015-03-11 LAB — LIPID PANEL
CHOLESTEROL: 220 mg/dL — AB (ref 0–200)
HDL: 74.5 mg/dL (ref >39.00)
LDL Cholesterol: 129 mg/dL — ABNORMAL HIGH (ref 0–99)
NonHDL: 145.5
TRIGLYCERIDES: 83 mg/dL (ref 0.0–149.0)
Total CHOL/HDL Ratio: 3
VLDL: 16.6 mg/dL (ref 0.0–40.0)

## 2015-03-11 LAB — BASIC METABOLIC PANEL
BUN: 11 mg/dL (ref 6–23)
CALCIUM: 9.2 mg/dL (ref 8.4–10.5)
CO2: 29 mEq/L (ref 19–32)
Chloride: 92 mEq/L — ABNORMAL LOW (ref 96–112)
Creatinine, Ser: 0.57 mg/dL (ref 0.40–1.20)
GFR: 107.13 mL/min (ref >60.00)
GLUCOSE: 75 mg/dL (ref 70–99)
Potassium: 4.2 mEq/L (ref 3.5–5.1)
Sodium: 126 mEq/L — ABNORMAL LOW (ref 135–145)

## 2015-03-11 NOTE — Addendum Note (Signed)
Addended by: Eulis Foster on: 03/11/2015 11:07 AM   Modules accepted: Orders

## 2015-03-11 NOTE — Progress Notes (Signed)
Quick Note:  Please make copy of labs for patient visit. ______ 

## 2015-03-15 ENCOUNTER — Ambulatory Visit (INDEPENDENT_AMBULATORY_CARE_PROVIDER_SITE_OTHER): Payer: Medicare Other | Admitting: Cardiology

## 2015-03-15 ENCOUNTER — Encounter: Payer: Self-pay | Admitting: Cardiology

## 2015-03-15 VITALS — BP 120/64 | HR 66 | Ht 62.0 in | Wt 95.0 lb

## 2015-03-15 DIAGNOSIS — E7800 Pure hypercholesterolemia, unspecified: Secondary | ICD-10-CM

## 2015-03-15 DIAGNOSIS — I10 Essential (primary) hypertension: Secondary | ICD-10-CM

## 2015-03-15 DIAGNOSIS — D62 Acute posthemorrhagic anemia: Secondary | ICD-10-CM

## 2015-03-15 NOTE — Patient Instructions (Signed)
Medication Instructions:  Your physician recommends that you continue on your current medications as directed. Please refer to the Current Medication list given to you today.  Labwork: NONE   Testing/Procedures: NONE  Follow-Up: Your physician wants you to follow-up in: 4 months with fasting labs (lp/bmet/hfp/CBC)  You will receive a reminder letter in the mail two months in advance. If you don't receive a letter, please call our office to schedule the follow-up appointment.

## 2015-03-15 NOTE — Progress Notes (Signed)
Cardiology Office Note   Date:  03/15/2015   ID:  Lanny, Donoso [birth year], MRN [id number]  PCP:  Warren Danes, MD  Cardiologist: Darlin Coco MD  No chief complaint on file.     History of Present Illness: Jenny Torres is a 81 y.o. female who presents for a four-month follow-up visit  Jenny Torres is a 81 y.o. female who presents today for a post hospital visit. She has a history of MVP, palpitations, COPD, HTN, anxiety & depression, HLD, carotid artery occlusion, B-12 deficiency, peripheral neuropathy and Raynaud's phenomenon. She has had situational depression related to ongoing stress regarding her husband's dementia. Also her son was recently diagnosed with ALS. She has a history of allergies and past history of asthma. She has a past history of low vitamin B12 levels first noted by her neurologist at [facility]. She has vitamin B12 injections on hand at home but has not taken any for the past 6 months or more. She has had difficulty in gaining weight. We have at this time she has gained 4 pounds since April and she is starting to eat ice cream and desserts. The patient fell on 02/11/2014 and broke her right hip. She underwent pinning of her hip by Dr. Doran Durand. She spent time in [place] and then has moved to [place] in an apartment. Her children insisted that she move out of her home.  Her husband who has dementia is also at [facility] in a different building. Presented to [facility] on 3/12 when she got up to either put some food or take out from microwave and abruptly passed out without warning symptoms and remembers waking up on the floor. She had hit her head on the floor. In the ED, sodium 129 and CT head without acute findings. Unclear etiology.  Echo was unremarkable. Does have carotid disease and will need follow up study in 6 months. The carotid Dopplers done on 12/03/14 showed a 60-79% right carotid artery stenosis. Less than 40% on the left. She has  been having problems with dry skin.  She had a TSH on 12/28/14 which was normal at 2.17  Past Medical History  Diagnosis Date  . Anxiety   . Depression   . COPD (chronic obstructive pulmonary disease)   . HTN (hypertension)   . Gastritis   . Polyp of colon   . Hemorrhoids, external   . Diverticula, colon   . GERD (gastroesophageal reflux disease)   . Carotid artery occlusion   . Idiopathic peripheral neuropathy     chronic  . Hyperlipidemia   . MVP (mitral valve prolapse)     stable  . Palpitations   . Osteoporosis   . Hyperlipidemia   . Raynaud's disease   . Anorexia     Past Surgical History  Procedure Laterality Date  . Cholecystectomy    . C-sections      x2  . Intramedullary (im) nail intertrochanteric Right 02/11/2014    Procedure: INTRAMEDULLARY (IM) NAIL INTERTROCHANTRIC;  Surgeon: [name], MD;  Location: [facility];  Service: Orthopedics;  Laterality: Right;     Current Outpatient Prescriptions  Medication Sig Dispense Refill  . acetaminophen (TYLENOL) 325 MG tablet Take 2 tablets (650 mg total) by mouth every 6 (six) hours as needed for mild pain (or Fever >/= 101). 30 tablet 0  . albuterol (PROVENTIL HFA;VENTOLIN HFA) 108 (90 BASE) MCG/ACT inhaler Inhale 1-2 puffs into the lungs every 6 (six) hours as needed for wheezing  or shortness of breath.    Marland Kitchen amLODipine (NORVASC) 2.5 MG tablet Take 1 tablet (2.5 mg total) by mouth daily. 90 tablet 1  . aspirin 81 MG tablet Take 81 mg by mouth daily.      . Cholecalciferol (VITAMIN D3) 1000 UNITS CAPS Take 1 capsule by mouth as needed (supplement).     . feeding supplement, ENSURE COMPLETE, (ENSURE COMPLETE) LIQD Take 237 mLs by mouth 2 (two) times daily between meals.    . gabapentin (NEURONTIN) 800 MG tablet Take 1 tablet (800 mg total) by mouth 2 (two) times daily.    . metoprolol tartrate (LOPRESSOR) 25 MG tablet Take 12.5 mg by mouth 2 (two) times daily.     . metoprolol tartrate (LOPRESSOR) 25 MG tablet Take 12.5 mg by  mouth 2 (two) times daily.    . sertraline (ZOLOFT) 25 MG tablet Take 1 tablet (25 mg total) by mouth daily. 90 tablet 3  . zolpidem (AMBIEN) 5 MG tablet Take 1 tablet (5 mg total) by mouth at bedtime as needed for sleep. 90 tablet 1   No current facility-administered medications for this visit.    Allergies:   Lovastatin; Sulfamethoxazole; Darvocet; Erythromycin; Hydrocod polst-cpm polst er; Penicillins; Theophyllines; Zovirax; Ceftin; and Paroxetine hcl    Social History:  The patient  reports that she quit smoking about 31 years ago. Her smoking use included Cigarettes. She has a 25 pack-year smoking history. She has never used smokeless tobacco. She reports that she drinks alcohol. She reports that she does not use illicit drugs.   Family History:  The patient's family history includes Breast cancer in her mother; Cirrhosis in her brother; Liver disease in her brother.    ROS:  Please see the history of present illness.   Otherwise, review of systems are positive for none.   All other systems are reviewed and negative.    PHYSICAL EXAM: VS:  BP 120/64 mmHg  Pulse 66  Ht 5\' 2"  (1.575 m)  Wt 95 lb (43.092 kg)  BMI 17.37 kg/m2 , BMI Body mass index is 17.37 kg/(m^2). GEN: Well nourished, well developed, in no acute distress HEENT: normal Neck: no JVD, carotid bruits, or masses Cardiac: RRR; no , rubs, or gallops,no edema.  Soft apical systolic murmur  Respiratory:  clear to auscultation bilaterally, normal work of breathing GI: soft, nontender, nondistended, + BS MS: no deformity or atrophy Skin: warm and dry, no rash Neuro:  Strength and sensation are intact Psych: euthymic mood, full affect   EKG:  EKG is ordered today. The ekg ordered today demonstrates normal sinus rhythm.  Within normal limits.   Recent Labs: 12/28/2014: Hemoglobin 12.6; Platelets 268.0; TSH 2.17 03/11/2015: ALT 12; BUN 11; Creatinine, Ser 0.57; Potassium 4.2; Sodium 126*    Lipid Panel    Component  Value Date/Time   CHOL 220* 03/11/2015 1107   TRIG 83.0 03/11/2015 1107   HDL 74.50 03/11/2015 1107   CHOLHDL 3 03/11/2015 1107   VLDL 16.6 03/11/2015 1107   LDLCALC 129* 03/11/2015 1107   LDLDIRECT 112.9 08/28/2013 1244      Wt Readings from Last 3 Encounters:  03/15/15 95 lb (43.092 kg)  12/28/14 91 lb 12.8 oz (41.64 kg)  12/10/14 88 lb (39.917 kg)         ASSESSMENT AND PLAN:  1. Labile hypertension 2. Hypercholesterolemia 3. peripheral neuropathy possibly secondary to prior statin usage. 4. mitral valve prolapse 5. Raynaud's phenomenon 6. history of right hip fracture with subsequent packed  cell transfusion for blood loss anemia 7. Dry skin. Rule out underactive thyroid. 8. Past history of syncope. No recurrence. She is presently wearing a 4 week event monitor. So far nothing found on the event monitor.    Current medicines are reviewed at length with the patient today.  The patient does not have concerns regarding medicines.  The following changes have been made:  no change  Labs/ tests ordered today include:   Orders Placed This Encounter  Procedures  . Lipid panel  . Hepatic function panel  . Basic metabolic panel  . CBC with Differential/Platelet  . EKG 12-Lead     Disposition: Continue on current medication.  Recheck in 4 months for office visit CBC lipid panel hepatic function panel and basal metabolic panel.  I encouraged her to continue to try to eat more and gain more weight.  Berna Spare MD 03/15/2015 5:39 PM    [address], [place], [place]  [address] Phone: [phone]; Fax: [phone]

## 2015-04-18 ENCOUNTER — Other Ambulatory Visit: Payer: Self-pay | Admitting: Cardiology

## 2015-05-08 ENCOUNTER — Emergency Department (HOSPITAL_COMMUNITY): Payer: Medicare Other

## 2015-05-08 ENCOUNTER — Observation Stay (HOSPITAL_COMMUNITY)
Admission: EM | Admit: 2015-05-08 | Discharge: 2015-05-10 | Disposition: A | Discharge status: Skilled Nursing Facility | Payer: Medicare Other | Attending: Internal Medicine | Admitting: Internal Medicine

## 2015-05-08 ENCOUNTER — Encounter (HOSPITAL_COMMUNITY): Payer: Self-pay | Admitting: *Deleted

## 2015-05-08 DIAGNOSIS — G609 Hereditary and idiopathic neuropathy, unspecified: Secondary | ICD-10-CM | POA: Insufficient documentation

## 2015-05-08 DIAGNOSIS — E871 Hypo-osmolality and hyponatremia: Secondary | ICD-10-CM | POA: Insufficient documentation

## 2015-05-08 DIAGNOSIS — S0003XA Contusion of scalp, initial encounter: Secondary | ICD-10-CM | POA: Insufficient documentation

## 2015-05-08 DIAGNOSIS — E785 Hyperlipidemia, unspecified: Secondary | ICD-10-CM | POA: Diagnosis not present

## 2015-05-08 DIAGNOSIS — K5900 Constipation, unspecified: Secondary | ICD-10-CM | POA: Insufficient documentation

## 2015-05-08 DIAGNOSIS — K219 Gastro-esophageal reflux disease without esophagitis: Secondary | ICD-10-CM | POA: Diagnosis not present

## 2015-05-08 DIAGNOSIS — I7300 Raynaud's syndrome without gangrene: Secondary | ICD-10-CM | POA: Insufficient documentation

## 2015-05-08 DIAGNOSIS — F329 Major depressive disorder, single episode, unspecified: Secondary | ICD-10-CM | POA: Insufficient documentation

## 2015-05-08 DIAGNOSIS — Z7982 Long term (current) use of aspirin: Secondary | ICD-10-CM | POA: Insufficient documentation

## 2015-05-08 DIAGNOSIS — S32501A Unspecified fracture of right pubis, initial encounter for closed fracture: Secondary | ICD-10-CM | POA: Diagnosis not present

## 2015-05-08 DIAGNOSIS — Z87891 Personal history of nicotine dependence: Secondary | ICD-10-CM | POA: Insufficient documentation

## 2015-05-08 DIAGNOSIS — M25551 Pain in right hip: Secondary | ICD-10-CM | POA: Diagnosis present

## 2015-05-08 DIAGNOSIS — S32509A Unspecified fracture of unspecified pubis, initial encounter for closed fracture: Secondary | ICD-10-CM | POA: Diagnosis present

## 2015-05-08 DIAGNOSIS — Z9181 History of falling: Secondary | ICD-10-CM | POA: Insufficient documentation

## 2015-05-08 DIAGNOSIS — W06XXXA Fall from bed, initial encounter: Secondary | ICD-10-CM | POA: Diagnosis not present

## 2015-05-08 DIAGNOSIS — I10 Essential (primary) hypertension: Secondary | ICD-10-CM | POA: Insufficient documentation

## 2015-05-08 DIAGNOSIS — J449 Chronic obstructive pulmonary disease, unspecified: Secondary | ICD-10-CM | POA: Diagnosis not present

## 2015-05-08 DIAGNOSIS — S50311A Abrasion of right elbow, initial encounter: Secondary | ICD-10-CM | POA: Insufficient documentation

## 2015-05-08 DIAGNOSIS — S32591A Other specified fracture of right pubis, initial encounter for closed fracture: Secondary | ICD-10-CM

## 2015-05-08 DIAGNOSIS — S329XXA Fracture of unspecified parts of lumbosacral spine and pelvis, initial encounter for closed fracture: Secondary | ICD-10-CM

## 2015-05-08 DIAGNOSIS — Z79899 Other long term (current) drug therapy: Secondary | ICD-10-CM | POA: Insufficient documentation

## 2015-05-08 DIAGNOSIS — I341 Nonrheumatic mitral (valve) prolapse: Secondary | ICD-10-CM | POA: Diagnosis not present

## 2015-05-08 DIAGNOSIS — M1611 Unilateral primary osteoarthritis, right hip: Secondary | ICD-10-CM | POA: Insufficient documentation

## 2015-05-08 DIAGNOSIS — S3282XA Multiple fractures of pelvis without disruption of pelvic ring, initial encounter for closed fracture: Secondary | ICD-10-CM | POA: Insufficient documentation

## 2015-05-08 DIAGNOSIS — W19XXXA Unspecified fall, initial encounter: Secondary | ICD-10-CM

## 2015-05-08 LAB — CBC WITH DIFFERENTIAL/PLATELET
Basophils Absolute: 0 10*3/uL (ref 0.0–0.1)
Basophils Relative: 0 % (ref 0–1)
EOS ABS: 0 10*3/uL (ref 0.0–0.7)
Eosinophils Relative: 0 % (ref 0–5)
HEMATOCRIT: 39.5 % (ref 36.0–46.0)
HEMOGLOBIN: 13.8 g/dL (ref 12.0–15.0)
LYMPHS ABS: 0.9 10*3/uL (ref 0.7–4.0)
LYMPHS PCT: 6 % — AB (ref 12–46)
MCH: 30.5 pg (ref 26.0–34.0)
MCHC: 34.9 g/dL (ref 30.0–36.0)
MCV: 87.4 fL (ref 78.0–100.0)
MONOS PCT: 6 % (ref 3–12)
Monocytes Absolute: 0.9 10*3/uL (ref 0.1–1.0)
NEUTROS PCT: 88 % — AB (ref 43–77)
Neutro Abs: 13.8 10*3/uL — ABNORMAL HIGH (ref 1.7–7.7)
Platelets: 212 10*3/uL (ref 150–400)
RBC: 4.52 MIL/uL (ref 3.87–5.11)
RDW: 12.5 % (ref 11.5–15.5)
WBC: 15.7 10*3/uL — ABNORMAL HIGH (ref 4.0–10.5)

## 2015-05-08 LAB — BASIC METABOLIC PANEL
Anion gap: 11 (ref 5–15)
BUN: 10 mg/dL (ref 6–20)
CHLORIDE: 93 mmol/L — AB (ref 101–111)
CO2: 26 mmol/L (ref 22–32)
CREATININE: 0.46 mg/dL (ref 0.44–1.00)
Calcium: 9.9 mg/dL (ref 8.9–10.3)
GFR calc Af Amer: >60 mL/min (ref >60)
GFR calc non Af Amer: >60 mL/min (ref >60)
GLUCOSE: 113 mg/dL — AB (ref 65–99)
Potassium: 4.2 mmol/L (ref 3.5–5.1)
SODIUM: 130 mmol/L — AB (ref 135–145)

## 2015-05-08 MED ORDER — FENTANYL CITRATE (PF) 100 MCG/2ML IJ SOLN
50.0000 ug | Freq: Once | INTRAMUSCULAR | Status: AC
Start: 2015-05-08 — End: 2015-05-08
  Administered 2015-05-08: 50 ug via INTRAVENOUS
  Filled 2015-05-08: qty 2

## 2015-05-08 MED ORDER — OXYCODONE HCL 5 MG PO TABS
5.0000 mg | ORAL_TABLET | ORAL | Status: DC | PRN
  Administered 2015-05-09 – 2015-05-10 (×5): 5 mg via ORAL
  Filled 2015-05-08 (×5): qty 1

## 2015-05-08 MED ORDER — ASPIRIN 81 MG PO CHEW
81.0000 mg | CHEWABLE_TABLET | Freq: Every day | ORAL | Status: DC
  Administered 2015-05-09 – 2015-05-10 (×2): 81 mg via ORAL
  Filled 2015-05-08 (×2): qty 1

## 2015-05-08 MED ORDER — METOPROLOL TARTRATE 25 MG PO TABS
12.5000 mg | ORAL_TABLET | Freq: Two times a day (BID) | ORAL | Status: DC
  Administered 2015-05-08 – 2015-05-10 (×4): 12.5 mg via ORAL
  Filled 2015-05-08 (×5): qty 0.5

## 2015-05-08 MED ORDER — ENOXAPARIN SODIUM 40 MG/0.4ML ~~LOC~~ SOLN
40.0000 mg | SUBCUTANEOUS | Status: DC
  Administered 2015-05-08 – 2015-05-09 (×2): 40 mg via SUBCUTANEOUS
  Filled 2015-05-08 (×3): qty 0.4

## 2015-05-08 MED ORDER — DOCUSATE SODIUM 100 MG PO CAPS
100.0000 mg | ORAL_CAPSULE | Freq: Two times a day (BID) | ORAL | Status: DC
  Administered 2015-05-08 – 2015-05-10 (×4): 100 mg via ORAL

## 2015-05-08 MED ORDER — ACETAMINOPHEN 325 MG PO TABS
650.0000 mg | ORAL_TABLET | Freq: Once | ORAL | Status: AC
  Administered 2015-05-08: 650 mg via ORAL
  Filled 2015-05-08: qty 2

## 2015-05-08 MED ORDER — GABAPENTIN 400 MG PO CAPS
800.0000 mg | ORAL_CAPSULE | Freq: Two times a day (BID) | ORAL | Status: DC
  Administered 2015-05-08 – 2015-05-10 (×4): 800 mg via ORAL
  Filled 2015-05-08 (×5): qty 2

## 2015-05-08 MED ORDER — ZOLPIDEM TARTRATE 5 MG PO TABS
5.0000 mg | ORAL_TABLET | Freq: Every evening | ORAL | Status: DC | PRN
  Administered 2015-05-08 – 2015-05-10 (×2): 5 mg via ORAL
  Filled 2015-05-08 (×2): qty 1

## 2015-05-08 MED ORDER — METHOCARBAMOL 500 MG PO TABS: 500.0000 mg | ORAL_TABLET | Freq: Four times a day (QID) | ORAL | Status: DC | PRN

## 2015-05-08 MED ORDER — ALBUTEROL SULFATE (2.5 MG/3ML) 0.083% IN NEBU: 2.5000 mg | INHALATION_SOLUTION | Freq: Four times a day (QID) | RESPIRATORY_TRACT | Status: DC | PRN

## 2015-05-08 MED ORDER — AMLODIPINE BESYLATE 2.5 MG PO TABS
2.5000 mg | ORAL_TABLET | Freq: Every day | ORAL | Status: DC
  Administered 2015-05-09 – 2015-05-10 (×2): 2.5 mg via ORAL
  Filled 2015-05-08 (×2): qty 1

## 2015-05-08 MED ORDER — MORPHINE SULFATE (PF) 2 MG/ML IV SOLN: 2.0000 mg | INTRAVENOUS | Status: DC | PRN

## 2015-05-08 MED ORDER — METHOCARBAMOL 1000 MG/10ML IJ SOLN
500.0000 mg | Freq: Four times a day (QID) | INTRAVENOUS | Status: DC | PRN
  Filled 2015-05-08: qty 5

## 2015-05-08 MED ORDER — SODIUM CHLORIDE 0.9 % IV SOLN
INTRAVENOUS | Status: DC
  Administered 2015-05-08: 23:00:00 via INTRAVENOUS

## 2015-05-08 MED ORDER — SERTRALINE HCL 50 MG PO TABS
25.0000 mg | ORAL_TABLET | Freq: Every day | ORAL | Status: DC
  Administered 2015-05-09 – 2015-05-10 (×2): 25 mg via ORAL
  Filled 2015-05-08 (×2): qty 0.5

## 2015-05-08 NOTE — ED Notes (Signed)
Attempt made to ambulate with pt. She feels she is unable  To place weight on RLE. Pt now c/o N/V

## 2015-05-08 NOTE — ED Notes (Signed)
Pt is from [facility] assisted [facility] facility. While attempting to move a chair on a carpeted floor pt fell landing onto her right side. Pt c/o right hip pain 5/10. Abrasion is present on right elbow and a hematoma on side of head. LOC, dizziness and or H/A were denied. Pt is A&Ox4

## 2015-05-08 NOTE — Progress Notes (Addendum)
05/09/15 1020 respons1015  ED CM received a call from Mya from [facility] to confirm that she no longer able to assist pt because she no longer works for [employer] will also contact the pt or daughter to updated them about this CM shared with Mya that [place] and [facility] Ped will be assisting pt  CM called 6E to updated her about Mya, [place] and [facility] services   [phone] ED CM received a return call from a female [facility] staff member [phone] at [phone] stating they are an independent contracted company working with [employer] and request Arville Go be provided the referral so they can be contacted CM sent referral to Arlyn Leak  05/08/15 [place] Cm faxed pt face sheet, home health orders and EDP note to [facility] at [phone] with confirmation report returned at [facility] a fax confirmation report for fax info sent to [facility] at [phone] The fax info sent to Mya at [facility] did not return with a successful confirmation report to [phone] Will be re faxed in am Office closed Fax may be off 1810 CM updated pt and Daughter on response from [facility], and Voice message left for Mya at [employer].  Pt continues to choose to have home health services from [facility], [facility] and [facility] connected with [facility] Attempted to get a fax number from Nicole Kindred for [employer] but he confirms he does not have one but states CM can fax pt /home health orders to [facility] green to be placed in her mailbox for therapy staff to pick in morning  1757 Cm updated EDP on responses of the home health agency pt voiced interest in using through [facility] green Discussed with Dr Ervin Knack that home health orders can be also sent to [facility] green and they also will assist with setting up services Dr Laneta Simmers states there is an order to ambulate the pt in ED that will assist with disposition response 1750 Cm left a voice message for Mya at [facility] ext [phone] Pending a return call   1730 Pt and daughter updated Daughter confirms pt is at the independent level at [facility]  [place] called Nicole Kindred again at 1709 and he provided [facility] contact number as [phone] and CM left a voice message at Pending a return call CM notes this number is for a PT terry [employer] 1704 Cm spoke with Santiago Glad at [facility] [phone] to state [facility] only provides Therapist, sports, CNA no PT She also states Mia (whom pt states worked with her and she wanted to work with her again) was with the old company ([facility]) working at [employer] returned a call to [facility] only provides medication assistance Gave CM number [phone] but this number does not allow CM to leave a voice message, No mailbox set up 1650 CM spoke with EDP after noting CM consult Cm called [facility] [address] [place], [place] [address]  Phone: [phone] to speak with the home health staff working with the pt Spoke with Felix Ahmadi who transferred CM to [place] .    [facility] assisted living Pt's visitor at bedside confirms pcp as dr Mare Ferrari Pt in radiology at this time

## 2015-05-08 NOTE — ED Notes (Signed)
Family at bedside. 

## 2015-05-08 NOTE — ED Notes (Signed)
Patient transported to CT 

## 2015-05-08 NOTE — ED Notes (Signed)
Patient transported to X-ray 

## 2015-05-08 NOTE — ED Provider Notes (Signed)
CSN: [id number]     Arrival date & time 05/08/15  1150 History   First MD Initiated Contact with Patient 05/08/15 1301     Chief Complaint  Patient presents with  . Fall  . Hip Pain    right     (Consider location/radiation/quality/duration/timing/severity/associated sxs/prior Treatment) HPI Comments: 81 yo female presenting after a fall which occurred while trying to transfer from bed to walker.  Complains of right head hematoma (no LOC) and right hip pain. Also has mild abrasion to right elbow. Hip pain is moderate, worse with movement.   Patient is a 81 y.o. female presenting with fall.  Fall This is a recurrent problem. Episode onset: today. Episode frequency: once. The problem has been resolved. Associated symptoms include headaches (mild, on right.). Pertinent negatives include no chest pain, no abdominal pain and no shortness of breath. The symptoms are aggravated by walking. The symptoms are relieved by rest. She has tried nothing for the symptoms.    Past Medical History  Diagnosis Date  . Anxiety   . Depression   . COPD (chronic obstructive pulmonary disease)   . HTN (hypertension)   . Gastritis   . Polyp of colon   . Hemorrhoids, external   . Diverticula, colon   . GERD (gastroesophageal reflux disease)   . Carotid artery occlusion   . Idiopathic peripheral neuropathy     chronic  . Hyperlipidemia   . MVP (mitral valve prolapse)     stable  . Palpitations   . Osteoporosis   . Hyperlipidemia   . Raynaud's disease   . Anorexia    Past Surgical History  Procedure Laterality Date  . Cholecystectomy    . C-sections      x2  . Intramedullary (im) nail intertrochanteric Right 02/11/2014    Procedure: INTRAMEDULLARY (IM) NAIL INTERTROCHANTRIC;  Surgeon: [name], MD;  Location: [facility];  Service: Orthopedics;  Laterality: Right;   Family History  Problem Relation Age of Onset  . Breast cancer Mother   . Liver disease Brother   . Cirrhosis Brother    Social  History  Substance Use Topics  . Smoking status: Former Smoker -- 1.00 packs/day for 25 years    Types: Cigarettes    Quit date: 09/22/1983  . Smokeless tobacco: Never Used  . Alcohol Use: Yes     Comment: rarely   OB History    No data available     Review of Systems  Respiratory: Negative for shortness of breath.   Cardiovascular: Negative for chest pain.  Gastrointestinal: Negative for abdominal pain.  Neurological: Positive for headaches (mild, on right.).  All other systems reviewed and are negative.     Allergies  Lovastatin; Sulfamethoxazole; Darvocet; Erythromycin; Hydrocod polst-cpm polst er; Penicillins; Theophyllines; Zovirax; Ceftin; and Paroxetine hcl  Home Medications   Prior to Admission medications   Medication Sig Start Date End Date Taking? Authorizing Provider  acetaminophen (TYLENOL) 325 MG tablet Take 2 tablets (650 mg total) by mouth every 6 (six) hours as needed for mild pain (or Fever >/= 101). 02/13/14  Yes [name], MD  albuterol (PROVENTIL HFA;VENTOLIN HFA) 108 (90 BASE) MCG/ACT inhaler Inhale 1-2 puffs into the lungs every 6 (six) hours as needed for wheezing or shortness of breath.   Yes Historical Provider, MD  amLODipine (NORVASC) 2.5 MG tablet Take 1 tablet (2.5 mg total) by mouth daily. 02/07/15  Yes Darlin Coco, MD  aspirin 81 MG tablet Take 81 mg by mouth daily.  Yes Historical Provider, MD  Cholecalciferol (VITAMIN D3) 1000 UNITS CAPS Take 1,000 Units by mouth daily.    Yes Historical Provider, MD  gabapentin (NEURONTIN) 800 MG tablet Take 1 tablet (800 mg total) by mouth 2 (two) times daily. 12/03/14  Yes Modena Jansky, MD  metoprolol tartrate (LOPRESSOR) 25 MG tablet TAKE ONE-HALF (1/2) TABLET TWICE A DAY Patient taking differently: TAKE 12.5 MG BY MOUTH TWICE DAILY. 04/19/15  Yes Darlin Coco, MD  sertraline (ZOLOFT) 25 MG tablet TAKE 1 TABLET DAILY Patient taking differently: TAKE 25 MG BY MOUTH DAILY 04/19/15  Yes Darlin Coco, MD  zolpidem (AMBIEN) 5 MG tablet Take 1 tablet (5 mg total) by mouth at bedtime as needed for sleep. 05/02/14  Yes Darlin Coco, MD  feeding supplement, ENSURE COMPLETE, (ENSURE COMPLETE) LIQD Take 237 mLs by mouth 2 (two) times daily between meals. Patient not taking: Reported on 05/08/2015 12/03/14   Modena Jansky, MD   BP 177/69 mmHg  Pulse 76  Temp(Src) 97.7 F (36.5 C) (Oral)  Resp 18  SpO2 92% Physical Exam  Constitutional: She is oriented to person, place, and time. She appears well-developed and well-nourished. No distress.  HENT:  Head: Normocephalic and atraumatic. Head is without raccoon's eyes and without Battle's sign.    Nose: Nose normal.  Eyes: Conjunctivae and EOM are normal. Pupils are equal, round, and reactive to light. No scleral icterus.  Neck: No spinous process tenderness and no muscular tenderness present.  Cardiovascular: Normal rate, regular rhythm, normal heart sounds and intact distal pulses.   No murmur heard. Pulmonary/Chest: Effort normal and breath sounds normal. She has no rales. She exhibits no tenderness.  Abdominal: Soft. There is no tenderness. There is no rebound and no guarding.  Musculoskeletal: Normal range of motion. She exhibits no edema or tenderness.       Thoracic back: She exhibits no tenderness and no bony tenderness.       Lumbar back: She exhibits no tenderness and no bony tenderness.  No evidence of trauma to extremities, except as noted.  2+ distal pulses.    R elbow: mild skin tear, good ROM without pain.    R hip: TTP, full passive ROM with limited pain, NV intact distally.    Neurological: She is alert and oriented to person, place, and time.  Skin: Skin is warm and dry. No rash noted.  Psychiatric: She has a normal mood and affect.  Nursing note and vitals reviewed.   ED Course  Procedures (including critical care time) Labs Review Labs Reviewed  CBC WITH DIFFERENTIAL/PLATELET - Abnormal; Notable for  the following:    WBC 15.7 (*)    Neutrophils Relative % 88 (*)    Neutro Abs 13.8 (*)    Lymphocytes Relative 6 (*)    All other components within normal limits  BASIC METABOLIC PANEL - Abnormal; Notable for the following:    Sodium 130 (*)    Chloride 93 (*)    Glucose, Bld 113 (*)    All other components within normal limits    Imaging Review Ct Head Wo Contrast  05/08/2015   CLINICAL DATA:  Status post fall today. Dizziness, headache and hematoma on the head. Initial encounter.  EXAM: CT HEAD WITHOUT CONTRAST  CT CERVICAL SPINE WITHOUT CONTRAST  TECHNIQUE: Multidetector CT imaging of the head and cervical spine was performed following the standard protocol without intravenous contrast. Multiplanar CT image reconstructions of the cervical spine were also generated.  COMPARISON:  Head and cervical spine CT scans 02/11/2014. Head CT scan 12/02/2014.  FINDINGS: CT HEAD FINDINGS  Hypoattenuation in the subcortical and periventricular deep white matter consistent with chronic microvascular ischemic change is again seen. No evidence of acute abnormality including hemorrhage, infarct, mass lesion, mass effect, midline shift or abnormal extra-axial fluid collection is identified. Scalp hematoma over the right parietal bone is noted. There is no fracture. Imaged paranasal sinuses and mastoid air cells are clear.  CT CERVICAL SPINE FINDINGS  Vertebral body height and alignment are maintained. Subtle lucencies across the C4 pedicles are less conspicuous than on the prior examination and could be secondary to nutrient canal or remote fractures. Lucency through the right C4 lamina described on the prior report is not visible on this exam. The patient has advanced multilevel facet arthropathy. Loss of disc space height and endplate spurring appear worst at C5-6, C6-7 and C7-T1. Apical scarring is noted bilaterally.  IMPRESSION: Scalp hematoma over the right parietal bone without underlying fracture. No other  acute abnormality head or cervical spine.  Chronic microvascular ischemic change.  Subtle lucencies through the C4 pedicle seen on the prior examination are less conspicuous today. They may be nutrient canal or less likely remote fractures.  Chronic microvascular ischemic change.   Electronically Signed   By: Inge Rise M.D.   On: 05/08/2015 16:01   Ct Cervical Spine Wo Contrast  05/08/2015   CLINICAL DATA:  Status post fall today. Dizziness, headache and hematoma on the head. Initial encounter.  EXAM: CT HEAD WITHOUT CONTRAST  CT CERVICAL SPINE WITHOUT CONTRAST  TECHNIQUE: Multidetector CT imaging of the head and cervical spine was performed following the standard protocol without intravenous contrast. Multiplanar CT image reconstructions of the cervical spine were also generated.  COMPARISON:  Head and cervical spine CT scans 02/11/2014. Head CT scan 12/02/2014.  FINDINGS: CT HEAD FINDINGS  Hypoattenuation in the subcortical and periventricular deep white matter consistent with chronic microvascular ischemic change is again seen. No evidence of acute abnormality including hemorrhage, infarct, mass lesion, mass effect, midline shift or abnormal extra-axial fluid collection is identified. Scalp hematoma over the right parietal bone is noted. There is no fracture. Imaged paranasal sinuses and mastoid air cells are clear.  CT CERVICAL SPINE FINDINGS  Vertebral body height and alignment are maintained. Subtle lucencies across the C4 pedicles are less conspicuous than on the prior examination and could be secondary to nutrient canal or remote fractures. Lucency through the right C4 lamina described on the prior report is not visible on this exam. The patient has advanced multilevel facet arthropathy. Loss of disc space height and endplate spurring appear worst at C5-6, C6-7 and C7-T1. Apical scarring is noted bilaterally.  IMPRESSION: Scalp hematoma over the right parietal bone without underlying fracture. No  other acute abnormality head or cervical spine.  Chronic microvascular ischemic change.  Subtle lucencies through the C4 pedicle seen on the prior examination are less conspicuous today. They may be nutrient canal or less likely remote fractures.  Chronic microvascular ischemic change.   Electronically Signed   By: Inge Rise M.D.   On: 05/08/2015 16:01   Dg Hip Unilat  With Pelvis 2-3 Views Right  05/08/2015   CLINICAL DATA:  81 year old female who fell from wheelchair. Right hip pain. Initial encounter.  EXAM: DG HIP (WITH OR WITHOUT PELVIS) 2-3V RIGHT  COMPARISON:  Intraoperative images of the right hip ORIF 02/11/2014.  FINDINGS: Right proximal femur intra medullary rod with interlocking dynamic hip screw  and distal interlocking cortical screw re- demonstrated. Hardware appears intact. Associated right hip osteoarthritis. Proximal right femur intact.  Mildly displaced and comminuted fractures of the right superior and inferior pubic rami appear acute. The pelvis elsewhere appears intact. Grossly intact proximal left femur.  IMPRESSION: 1. Acute right superior and inferior pubic rami fractures. 2. Stable proximal right femur ORIF.  Right hip osteoarthritis.   Electronically Signed   By: Genevie Ann M.D.   On: 05/08/2015 14:15   I have personally reviewed and evaluated these images and lab results as part of my medical decision-making.   EKG Interpretation None      MDM   Final diagnoses:  Fall, initial encounter  Scalp hematoma, initial encounter  Elbow abrasion, right, initial encounter  Pubic ramus fracture, right, closed, initial encounter    Found to have right pubic bone fractures.  She needed head and  Cspine CT's due to her fall, head trauma, age, and PE signs of head trauma.  These were negative.  IV fentanyl for pain.    Dispo pending pain control, attempt at ambulation.  Care transferred to Dr. Laneta Simmers.      Serita Grit, MD 05/08/15 [phone]

## 2015-05-08 NOTE — H&P (Signed)
[facility] History and Physical  Patient: Jenny Torres  MRN: [id number]  DOB: 04/12/1928  DOS: the patient was seen and examined on 05/08/2015 PCP: Warren Danes, MD  Referring physician: Dr. Rex Kras Chief Complaint: Fall  HPI: Jenny Torres is a 81 y.o. female with Past medical history of idiopathic peripheral neuropathy, Raynaud's phenomenon, essential hypertension, GERD, osteoporosis. The patient is presenting with a mechanical fall. The patient was at assisted living facility. while transferring from her bed to the walker she lost her balance and fell on the ground. She also had head injury. She denies having any loss of consciousness or any neck pain. She complained of pain on her right side of the hip and also some right elbow injury and was brought here for further workup. At the time of my evaluation the patient mentions the pain is tolerable when she is resting in the bed and it is significantly worse when she is trying to ambulate. She denies any complaints of dizziness and lightheadedness no vision changeschanges on choking episode no new focal deficit no chest pain no abdominal pain no nausea no shortness of breath no recent fever chills cough burning urination diarrhea or constipation. She also denies any recent change in her medications.  The patient is coming from home.  At her baseline ambulates with a walker And is independent for most of her ADL and manages her medication on her own.  Review of Systems: as mentioned in the history of present illness.  A comprehensive review of the other systems is negative.  Past Medical History  Diagnosis Date  . Anxiety   . Depression   . COPD (chronic obstructive pulmonary disease)   . HTN (hypertension)   . Gastritis   . Polyp of colon   . Hemorrhoids, external   . Diverticula, colon   . GERD (gastroesophageal reflux disease)   . Carotid artery occlusion   . Idiopathic peripheral neuropathy     chronic  .  Hyperlipidemia   . MVP (mitral valve prolapse)     stable  . Palpitations   . Osteoporosis   . Hyperlipidemia   . Raynaud's disease   . Anorexia    Past Surgical History  Procedure Laterality Date  . Cholecystectomy    . C-sections      x2  . Intramedullary (im) nail intertrochanteric Right 02/11/2014    Procedure: INTRAMEDULLARY (IM) NAIL INTERTROCHANTRIC;  Surgeon: [name], MD;  Location: [place];  Service: Orthopedics;  Laterality: Right;   Social History:  reports that she quit smoking about 31 years ago. Her smoking use included Cigarettes. She has a 25 pack-year smoking history. She has never used smokeless tobacco. She reports that she drinks alcohol. She reports that she does not use illicit drugs.  Allergies  Allergen Reactions  . Lovastatin Other (See Comments)    unknown  . Sulfamethoxazole Swelling    Swelling of the throat, mouth, tongue, nose  . Darvocet [Propoxyphene N-Acetaminophen] Itching  . Erythromycin Nausea And Vomiting    Upset stomach  . Hydrocod Polst-Cpm Polst Er Nausea Only    nausea  . Penicillins Other (See Comments)    unknown  . Theophyllines Other (See Comments)    Unknown reaction  . Zovirax [Acyclovir] Other (See Comments)    Per patient lots of side effects  . Ceftin Diarrhea    GI problems  . Paroxetine Hcl Other (See Comments)    jittery    Family History  Problem Relation Age of  Onset  . Breast cancer Mother   . Liver disease Brother   . Cirrhosis Brother     Prior to Admission medications   Medication Sig Start Date End Date Taking? Authorizing Provider  acetaminophen (TYLENOL) 325 MG tablet Take 2 tablets (650 mg total) by mouth every 6 (six) hours as needed for mild pain (or Fever >/= 101). 02/13/14  Yes [name], MD  albuterol (PROVENTIL HFA;VENTOLIN HFA) 108 (90 BASE) MCG/ACT inhaler Inhale 1-2 puffs into the lungs every 6 (six) hours as needed for wheezing or shortness of breath.   Yes Historical Provider, MD   amLODipine (NORVASC) 2.5 MG tablet Take 1 tablet (2.5 mg total) by mouth daily. 02/07/15  Yes Darlin Coco, MD  aspirin 81 MG tablet Take 81 mg by mouth daily.     Yes Historical Provider, MD  Cholecalciferol (VITAMIN D3) 1000 UNITS CAPS Take 1,000 Units by mouth daily.    Yes Historical Provider, MD  gabapentin (NEURONTIN) 800 MG tablet Take 1 tablet (800 mg total) by mouth 2 (two) times daily. 12/03/14  Yes Modena Jansky, MD  metoprolol tartrate (LOPRESSOR) 25 MG tablet TAKE ONE-HALF (1/2) TABLET TWICE A DAY Patient taking differently: TAKE 12.5 MG BY MOUTH TWICE DAILY. 04/19/15  Yes Darlin Coco, MD  sertraline (ZOLOFT) 25 MG tablet TAKE 1 TABLET DAILY Patient taking differently: TAKE 25 MG BY MOUTH DAILY 04/19/15  Yes Darlin Coco, MD  zolpidem (AMBIEN) 5 MG tablet Take 1 tablet (5 mg total) by mouth at bedtime as needed for sleep. 05/02/14  Yes Darlin Coco, MD  feeding supplement, ENSURE COMPLETE, (ENSURE COMPLETE) LIQD Take 237 mLs by mouth 2 (two) times daily between meals. Patient not taking: Reported on 05/08/2015 12/03/14   Modena Jansky, MD    Physical Exam: Filed Vitals:   05/08/15 1201 05/08/15 1419 05/08/15 1702  BP: 171/93 177/69 141/69  Pulse: 70 76 77  Temp: 97.7 F (36.5 C)    TempSrc: Oral    Resp: 18 18 18   SpO2: 95% 92% 91%    General: Alert, Awake and Oriented to Time, Place and Person. Appear in mild distress Eyes: PERRL ENT: Oral Mucosa clear moist. Neck: no JVD Cardiovascular: S1 and S2 Present, no Murmur, Peripheral Pulses Present Respiratory: Bilateral Air entry equal and Decreased,  Clear to Auscultation, no Crackles, no wheezes Abdomen: Bowel Sound present, Soft and no tenderness Skin: no Rash Extremities: no Pedal edema, no calf tenderness Neurologic: Grossly no focal neuro deficit.  Labs on Admission:  CBC:  Recent Labs Lab 05/08/15 1604  WBC 15.7*  NEUTROABS 13.8*  HGB 13.8  HCT 39.5  MCV 87.4  PLT 212    CMP      Component Value Date/Time   NA 130* 05/08/2015 1604   K 4.2 05/08/2015 1604   CL 93* 05/08/2015 1604   CO2 26 05/08/2015 1604   GLUCOSE 113* 05/08/2015 1604   BUN 10 05/08/2015 1604   CREATININE 0.46 05/08/2015 1604   CALCIUM 9.9 05/08/2015 1604   PROT 6.7 03/11/2015 1107   ALBUMIN 4.0 03/11/2015 1107   AST 24 03/11/2015 1107   ALT 12 03/11/2015 1107   ALKPHOS 112 03/11/2015 1107   BILITOT 0.7 03/11/2015 1107   GFRNONAA >60 05/08/2015 1604   GFRAA >60 05/08/2015 1604    No results for input(s): LIPASE, AMYLASE in the last 168 hours.  No results for input(s): CKTOTAL, CKMB, CKMBINDEX, TROPONINI in the last 168 hours. BNP (last 3 results) No results for input(s):  BNP in the last 8760 hours.  ProBNP (last 3 results) No results for input(s): PROBNP in the last 8760 hours.   Radiological Exams on Admission: Ct Head Wo Contrast  05/08/2015   CLINICAL DATA:  Status post fall today. Dizziness, headache and hematoma on the head. Initial encounter.  EXAM: CT HEAD WITHOUT CONTRAST  CT CERVICAL SPINE WITHOUT CONTRAST  TECHNIQUE: Multidetector CT imaging of the head and cervical spine was performed following the standard protocol without intravenous contrast. Multiplanar CT image reconstructions of the cervical spine were also generated.  COMPARISON:  Head and cervical spine CT scans 02/11/2014. Head CT scan 12/02/2014.  FINDINGS: CT HEAD FINDINGS  Hypoattenuation in the subcortical and periventricular deep white matter consistent with chronic microvascular ischemic change is again seen. No evidence of acute abnormality including hemorrhage, infarct, mass lesion, mass effect, midline shift or abnormal extra-axial fluid collection is identified. Scalp hematoma over the right parietal bone is noted. There is no fracture. Imaged paranasal sinuses and mastoid air cells are clear.  CT CERVICAL SPINE FINDINGS  Vertebral body height and alignment are maintained. Subtle lucencies across the C4 pedicles  are less conspicuous than on the prior examination and could be secondary to nutrient canal or remote fractures. Lucency through the right C4 lamina described on the prior report is not visible on this exam. The patient has advanced multilevel facet arthropathy. Loss of disc space height and endplate spurring appear worst at C5-6, C6-7 and C7-T1. Apical scarring is noted bilaterally.  IMPRESSION: Scalp hematoma over the right parietal bone without underlying fracture. No other acute abnormality head or cervical spine.  Chronic microvascular ischemic change.  Subtle lucencies through the C4 pedicle seen on the prior examination are less conspicuous today. They may be nutrient canal or less likely remote fractures.  Chronic microvascular ischemic change.   Electronically Signed   By: Inge Rise M.D.   On: 05/08/2015 16:01   Ct Cervical Spine Wo Contrast  05/08/2015   CLINICAL DATA:  Status post fall today. Dizziness, headache and hematoma on the head. Initial encounter.  EXAM: CT HEAD WITHOUT CONTRAST  CT CERVICAL SPINE WITHOUT CONTRAST  TECHNIQUE: Multidetector CT imaging of the head and cervical spine was performed following the standard protocol without intravenous contrast. Multiplanar CT image reconstructions of the cervical spine were also generated.  COMPARISON:  Head and cervical spine CT scans 02/11/2014. Head CT scan 12/02/2014.  FINDINGS: CT HEAD FINDINGS  Hypoattenuation in the subcortical and periventricular deep white matter consistent with chronic microvascular ischemic change is again seen. No evidence of acute abnormality including hemorrhage, infarct, mass lesion, mass effect, midline shift or abnormal extra-axial fluid collection is identified. Scalp hematoma over the right parietal bone is noted. There is no fracture. Imaged paranasal sinuses and mastoid air cells are clear.  CT CERVICAL SPINE FINDINGS  Vertebral body height and alignment are maintained. Subtle lucencies across the C4  pedicles are less conspicuous than on the prior examination and could be secondary to nutrient canal or remote fractures. Lucency through the right C4 lamina described on the prior report is not visible on this exam. The patient has advanced multilevel facet arthropathy. Loss of disc space height and endplate spurring appear worst at C5-6, C6-7 and C7-T1. Apical scarring is noted bilaterally.  IMPRESSION: Scalp hematoma over the right parietal bone without underlying fracture. No other acute abnormality head or cervical spine.  Chronic microvascular ischemic change.  Subtle lucencies through the C4 pedicle seen on the prior examination are  less conspicuous today. They may be nutrient canal or less likely remote fractures.  Chronic microvascular ischemic change.   Electronically Signed   By: Inge Rise M.D.   On: 05/08/2015 16:01   Dg Hip Unilat  With Pelvis 2-3 Views Right  05/08/2015   CLINICAL DATA:  81 year old female who fell from wheelchair. Right hip pain. Initial encounter.  EXAM: DG HIP (WITH OR WITHOUT PELVIS) 2-3V RIGHT  COMPARISON:  Intraoperative images of the right hip ORIF 02/11/2014.  FINDINGS: Right proximal femur intra medullary rod with interlocking dynamic hip screw and distal interlocking cortical screw re- demonstrated. Hardware appears intact. Associated right hip osteoarthritis. Proximal right femur intact.  Mildly displaced and comminuted fractures of the right superior and inferior pubic rami appear acute. The pelvis elsewhere appears intact. Grossly intact proximal left femur.  IMPRESSION: 1. Acute right superior and inferior pubic rami fractures. 2. Stable proximal right femur ORIF.  Right hip osteoarthritis.   Electronically Signed   By: Genevie Ann M.D.   On: 05/08/2015 14:15   Assessment/Plan Principal Problem:   Pubic bone fracture Active Problems:   Raynaud's disease   Idiopathic peripheral neuropathy   HTN (hypertension)   1. Pubic bone fracture The patient is  presenting with a mechanical fall. She did have some head injury but CT of the head as well as C-spine is negative for any acute abnormality. She does not have any focal deficit as well. She complains of right-sided hip pain and x-ray of the hip is showing acute right superior and inferior pubic rami fracture without any new acute femur or hip fracture. Prior stable right femur ORIF. With this the patient will be admitted in the hospital for pain management. PTOT consultation. Case manager is already working on her. We will use when necessary OxyIR as well as morphine for pain management.  2. Raynaud's disease and idiopathic peripheral neuropathy. As per the patient this is the cause of her frequent falls and gait disturbances. As per the patient she has extensive workup and has been diagnosed with idiopathic neuropathy. Currently I will continue gabapentin.  3. Essential hypertension. Blood pressure stable continue home medications.  4. Mild hyponatremia. IV hydration. Recheck in morning.  5. Constipation. Colace for bowel regimen.  Advance goals of care discussion: DNR/DNI as per my discussion with patient   DVT Prophylaxis: subcutaneous Heparin Nutrition: Regular cardiac diet  Family Communication: family was present at bedside, opportunity was given to ask question and all questions were answered satisfactorily at the time of interview. Disposition: Admitted as observation, med-surge unit.  Author: Berle Mull, MD [facility] Pager: [phone] 05/08/2015  If 7PM-7AM, please contact night-coverage [email or web address] Password [email or web address]

## 2015-05-09 DIAGNOSIS — S32501A Unspecified fracture of right pubis, initial encounter for closed fracture: Secondary | ICD-10-CM | POA: Diagnosis not present

## 2015-05-09 DIAGNOSIS — I7300 Raynaud's syndrome without gangrene: Secondary | ICD-10-CM | POA: Diagnosis not present

## 2015-05-09 DIAGNOSIS — G609 Hereditary and idiopathic neuropathy, unspecified: Secondary | ICD-10-CM

## 2015-05-09 DIAGNOSIS — E871 Hypo-osmolality and hyponatremia: Secondary | ICD-10-CM

## 2015-05-09 DIAGNOSIS — I10 Essential (primary) hypertension: Secondary | ICD-10-CM | POA: Diagnosis not present

## 2015-05-09 MED ORDER — PROMETHAZINE HCL 25 MG/ML IJ SOLN
12.5000 mg | Freq: Three times a day (TID) | INTRAMUSCULAR | Status: DC | PRN
  Administered 2015-05-09: 12.5 mg via INTRAVENOUS
  Filled 2015-05-09: qty 1

## 2015-05-09 NOTE — Evaluation (Signed)
Occupational Therapy Evaluation Patient Details Name: Jenny Torres MRN: [id number] DOB: 07-10-1928 Today's Date: 05/09/2015    History of Present Illness Found to have right pubic bone fractures s/p fall at [facility]   Clinical Impression   Pt admitted with pelvic fracture. Pt currently with functional limitations due to the deficits listed below (see OT Problem List).  Pt will benefit from skilled OT to increase their safety and independence with ADL and functional mobility for ADL to facilitate discharge to venue listed below.      Follow Up Recommendations  Home health OT;SNF;Supervision/Assistance - 24 hour (ALF with increased A versus SNF ([facility]))    Equipment Recommendations  None recommended by OT    Recommendations for Other Services       Precautions / Restrictions Precautions Precautions: Fall Restrictions Weight Bearing Restrictions: No      Mobility Bed Mobility Overal bed mobility: Needs Assistance Bed Mobility: Supine to Sit     Supine to sit: Min assist        Transfers Overall transfer level: Needs assistance Equipment used: Rolling walker (2 wheeled) Transfers: Sit to/from [facility] Sit to Stand: Mod assist Stand pivot transfers: Mod assist       General transfer comment: VC to keep feet apart and for hand placement on walker    Balance                                            ADL Overall ADL's : Needs assistance/impaired     Grooming: Sitting;Set up               Lower Body Dressing: Maximal assistance;Sit to/from stand   Toilet Transfer: Moderate assistance;RW;Ambulation;Cueing for safety;Cueing for sequencing   Toileting- Clothing Manipulation and Hygiene: Moderate assistance;Sit to/from stand;Cueing for sequencing;Cueing for safety       Functional mobility during ADLs: Moderate assistance General ADL Comments: pt may need STN SNF before return to ILF                Pertinent Vitals/Pain Pain Assessment: 0-10 Pain Score: 7  (sitting- 0/ 7 with movement) Pain Location: pelvic area Pain Descriptors / Indicators: Sore;Sharp Pain Intervention(s): Limited activity within patient's tolerance;Monitored during session;Patient requesting pain meds-RN notified     Hand Dominance     Extremity/Trunk Assessment Upper Extremity Assessment Upper Extremity Assessment: Generalized weakness           Communication Communication Communication: No difficulties   Cognition Arousal/Alertness: Awake/alert Behavior During Therapy: WFL for tasks assessed/performed Overall Cognitive Status: Within Functional Limits for tasks assessed                                Home Living Family/patient expects to be discharged to:: Other (Comment) ([facility])     Type of Home: Apartment Home Access: Ramped entrance     Home Layout: One level               Home Equipment: Walker - 2 wheels;Grab bars - toilet;Grab bars - tub/shower;Shower seat   Additional Comments: husband with dementia/Alz lives at [facility]      Prior Functioning/Environment Level of Independence: Independent with assistive device(s)             OT Diagnosis: Generalized weakness;Acute pain   OT Problem  List: Decreased strength;Decreased activity tolerance;Impaired balance (sitting and/or standing);Decreased knowledge of use of DME or AE   OT Treatment/Interventions: Self-care/ADL training;DME and/or AE instruction;Patient/family education    OT Goals(Current goals can be found in the care plan section) Acute Rehab OT Goals Patient Stated Goal: back to [facility] OT Goal Formulation: With patient Time For Goal Achievement: 05/23/15 Potential to Achieve Goals: Good ADL Goals Pt Will Perform Grooming: with supervision;standing Pt Will Perform Lower Body Dressing: with supervision;sit to/from stand Pt Will Transfer to Toilet: with  supervision;regular height toilet Pt Will Perform Toileting - Clothing Manipulation and hygiene: with supervision;sit to/from stand  OT Frequency: Min 2X/week   Barriers to D/C:               End of Session Equipment Utilized During Treatment: Surveyor, mining Communication: Mobility status  Activity Tolerance: Patient tolerated treatment well Patient left: in chair;with call bell/phone within reach   Time:  -    Charges:  OT General Charges $OT Visit: 1 Procedure OT Evaluation $Initial OT Evaluation Tier I: 1 Procedure OT Treatments $Self Care/Home Management : 8-22 mins G-Codes:    Payton Mccallum D 2015/06/05, 11:39 AM

## 2015-05-09 NOTE — Progress Notes (Addendum)
PROGRESS NOTE  Jenny Torres EXN:[id number] DOB: [birth year] DOA: 05/07/78 PCP: Warren Danes, MD  HPI/Recap of past 24 hours:  Sitting in chair, reported less pain, reported vomited several times yesterday, no vomiting today Reported sugar ant bites a few days ago, now better, no itching  Assessment/Plan: Principal Problem:   Pubic bone fracture Active Problems:   Raynaud's disease   Idiopathic peripheral neuropathy   HTN (hypertension)  1. Pubic bone fracture S/p a mechanical fall.  CT of the head as well as C-spine is negative for any acute abnormality.  right-sided hip pain and x-ray of the hip is showing acute right superior and inferior pubic rami fracture without any new acute femur or hip fracture. Prior stable right femur ORIF.  pain management/ PTOT consultation/discharge planning   2. Raynaud's disease and idiopathic peripheral neuropathy. As per the patient this is the cause of her frequent falls and gait disturbances. As per the patient she has extensive workup and has been diagnosed with idiopathic neuropathy.  continue gabapentin,ambulating with a walker at baseline  3. Essential hypertension. Blood pressure stable continue home medications.  4. Mild hyponatremia. Reported this is a chronic problem known to her, she was told to fluids restrict, but she reported she likes to drink water. IV hydration.  Consider salt tabs  5. Constipation. Colace for bowel regimen.  Code Status: DNR/DNI, verified by admitting MD  Family Communication: patient   Disposition Plan:  home with home health vs rehab, pending official pt/ot recommendations   Consultants:  none  Procedures:  none  Antibiotics:  none   Objective: BP 145/70 mmHg  Pulse 75  Temp(Src) 98.6 F (37 C) (Oral)  Resp 16  Ht 5\' 2"  (1.575 m)  Wt 95 lb (43.092 kg)  BMI 17.37 kg/m2  SpO2 94%  Intake/Output Summary (Last 24 hours) at 05/09/15 1215 Last data filed at 05/09/15 1028  Gross per 24 hour  Intake    300 ml  Output    250 ml  Net     50 ml   Filed Weights   05/08/15 2059  Weight: 95 lb (43.092 kg)    Exam:   General:  Frail, NAD  Cardiovascular: RRR  Respiratory: CTABL  Abdomen: Soft/ND/NT, positive BS  Musculoskeletal: mild pubic bone tenderness to the right , No Edema  Neuro: aaox3  Skin: scattered mild erythema from "sugar ant bite" nontender, not itchy, no open wounds. . Data Reviewed: Basic Metabolic Panel:  Recent Labs Lab 05/08/15 1604  NA 130*  K 4.2  CL 93*  CO2 26  GLUCOSE 113*  BUN 10  CREATININE 0.46  CALCIUM 9.9   Liver Function Tests: No results for input(s): AST, ALT, ALKPHOS, BILITOT, PROT, ALBUMIN in the last 168 hours. No results for input(s): LIPASE, AMYLASE in the last 168 hours. No results for input(s): AMMONIA in the last 168 hours. CBC:  Recent Labs Lab 05/08/15 1604  WBC 15.7*  NEUTROABS 13.8*  HGB 13.8  HCT 39.5  MCV 87.4  PLT 212   Cardiac Enzymes:   No results for input(s): CKTOTAL, CKMB, CKMBINDEX, TROPONINI in the last 168 hours. BNP (last 3 results) No results for input(s): BNP in the last 8760 hours.  ProBNP (last 3 results) No results for input(s): PROBNP in the last 8760 hours.  CBG: No results for input(s): GLUCAP in the last 168 hours.  No results found for this or any previous visit (from the past 240 hour(s)).   Studies: Ct Head Wo  Contrast  05/08/2015   CLINICAL DATA:  Status post fall today. Dizziness, headache and hematoma on the head. Initial encounter.  EXAM: CT HEAD WITHOUT CONTRAST  CT CERVICAL SPINE WITHOUT CONTRAST  TECHNIQUE: Multidetector CT imaging of the head and cervical spine was performed following the standard protocol without intravenous contrast. Multiplanar CT image reconstructions of the cervical spine were also generated.  COMPARISON:  Head and cervical spine CT scans 02/11/2014. Head CT scan 12/02/2014.  FINDINGS: CT HEAD FINDINGS  Hypoattenuation in  the subcortical and periventricular deep white matter consistent with chronic microvascular ischemic change is again seen. No evidence of acute abnormality including hemorrhage, infarct, mass lesion, mass effect, midline shift or abnormal extra-axial fluid collection is identified. Scalp hematoma over the right parietal bone is noted. There is no fracture. Imaged paranasal sinuses and mastoid air cells are clear.  CT CERVICAL SPINE FINDINGS  Vertebral body height and alignment are maintained. Subtle lucencies across the C4 pedicles are less conspicuous than on the prior examination and could be secondary to nutrient canal or remote fractures. Lucency through the right C4 lamina described on the prior report is not visible on this exam. The patient has advanced multilevel facet arthropathy. Loss of disc space height and endplate spurring appear worst at C5-6, C6-7 and C7-T1. Apical scarring is noted bilaterally.  IMPRESSION: Scalp hematoma over the right parietal bone without underlying fracture. No other acute abnormality head or cervical spine.  Chronic microvascular ischemic change.  Subtle lucencies through the C4 pedicle seen on the prior examination are less conspicuous today. They may be nutrient canal or less likely remote fractures.  Chronic microvascular ischemic change.   Electronically Signed   By: Inge Rise M.D.   On: 05/08/2015 16:01   Ct Cervical Spine Wo Contrast  05/08/2015   CLINICAL DATA:  Status post fall today. Dizziness, headache and hematoma on the head. Initial encounter.  EXAM: CT HEAD WITHOUT CONTRAST  CT CERVICAL SPINE WITHOUT CONTRAST  TECHNIQUE: Multidetector CT imaging of the head and cervical spine was performed following the standard protocol without intravenous contrast. Multiplanar CT image reconstructions of the cervical spine were also generated.  COMPARISON:  Head and cervical spine CT scans 02/11/2014. Head CT scan 12/02/2014.  FINDINGS: CT HEAD FINDINGS   Hypoattenuation in the subcortical and periventricular deep white matter consistent with chronic microvascular ischemic change is again seen. No evidence of acute abnormality including hemorrhage, infarct, mass lesion, mass effect, midline shift or abnormal extra-axial fluid collection is identified. Scalp hematoma over the right parietal bone is noted. There is no fracture. Imaged paranasal sinuses and mastoid air cells are clear.  CT CERVICAL SPINE FINDINGS  Vertebral body height and alignment are maintained. Subtle lucencies across the C4 pedicles are less conspicuous than on the prior examination and could be secondary to nutrient canal or remote fractures. Lucency through the right C4 lamina described on the prior report is not visible on this exam. The patient has advanced multilevel facet arthropathy. Loss of disc space height and endplate spurring appear worst at C5-6, C6-7 and C7-T1. Apical scarring is noted bilaterally.  IMPRESSION: Scalp hematoma over the right parietal bone without underlying fracture. No other acute abnormality head or cervical spine.  Chronic microvascular ischemic change.  Subtle lucencies through the C4 pedicle seen on the prior examination are less conspicuous today. They may be nutrient canal or less likely remote fractures.  Chronic microvascular ischemic change.   Electronically Signed   By: Inge Rise M.D.  On: 05/08/2015 16:01   Dg Hip Unilat  With Pelvis 2-3 Views Right  05/08/2015   CLINICAL DATA:  81 year old female who fell from wheelchair. Right hip pain. Initial encounter.  EXAM: DG HIP (WITH OR WITHOUT PELVIS) 2-3V RIGHT  COMPARISON:  Intraoperative images of the right hip ORIF 02/11/2014.  FINDINGS: Right proximal femur intra medullary rod with interlocking dynamic hip screw and distal interlocking cortical screw re- demonstrated. Hardware appears intact. Associated right hip osteoarthritis. Proximal right femur intact.  Mildly displaced and comminuted  fractures of the right superior and inferior pubic rami appear acute. The pelvis elsewhere appears intact. Grossly intact proximal left femur.  IMPRESSION: 1. Acute right superior and inferior pubic rami fractures. 2. Stable proximal right femur ORIF.  Right hip osteoarthritis.   Electronically Signed   By: Genevie Ann M.D.   On: 05/08/2015 14:15    Scheduled Meds: . amLODipine  2.5 mg Oral Daily  . aspirin  81 mg Oral Daily  . docusate sodium  100 mg Oral BID  . enoxaparin (LOVENOX) injection  40 mg Subcutaneous Q24H  . gabapentin  800 mg Oral BID  . metoprolol tartrate  12.5 mg Oral BID  . sertraline  25 mg Oral Daily    Continuous Infusions: . sodium chloride 50 mL/hr at 05/08/15 2328     Time spent: 55mins  Zuri Bradway MD, PhD  [facility] Pager [phone]. If 7PM-7AM, please contact night-coverage at [email or web address], password [facility] 05/09/2015, 12:15 PM

## 2015-05-09 NOTE — Clinical Social Work Note (Signed)
Clinical Social Work Assessment  Patient Details  Name: Jenny Torres MRN: [id number] Date of Birth: December 21, 1927  Date of referral:  05/09/15               Reason for consult:  Discharge Planning, Facility Placement                Permission sought to share information with:  Facility Art therapist granted to share information::  Yes, Verbal Permission Granted  Name::        Agency::     Relationship::     Contact Information:     Housing/Transportation Living arrangements for the past 2 months:  [place] of Information:  Patient, Adult Children Patient Interpreter Needed:  None Criminal Activity/Legal Involvement Pertinent to Current Situation/Hospitalization:  No - Comment as needed Significant Relationships:  Adult Children Lives with:  Self Do you feel safe going back to the place where you live?   (ST Rehab needed.) Need for family participation in patient care:  Yes (Comment)  Care giving concerns:  Pt's care cannot be managed at home following hospitalization.    Social Worker assessment / plan: Pt hospitalized on 05/08/15 with a Pubic Bone Fx. Pt fell at her apt [facility] CSW met with pt / daughter to assist with d/c planning. PT has recommended ST Rehab prior to d/c home. Pt / daughter feel this is a good plan and have requested placement at [facility]. Clinical info has been sent to [facility] and a decision is pending. CSW will continue to follow to assist with d/c planning to SNF.  Employment status:  Retired Nurse, adult PT Recommendations:  [place] / Referral to community resources:  [place]  Patient/Family's Response to care: Pt / daughter feel ST Rehab is needed.  Patient/Family's Understanding of and Emotional Response to Diagnosis, Current Treatment, and Prognosis:  Pt / daughter have a good understanding of pt's medical status.  Pt is motivated to work with therapy and is hopeful that [facility] will have an opening for her. " [facility] when I fx my hip last May. It was a good experience. "  Emotional Assessment Appearance:  Appears stated age Attitude/Demeanor/Rapport:  Other (cooperative) Affect (typically observed):  Appropriate, Pleasant Orientation:  Oriented to Self, Oriented to Place, Oriented to  Time, Oriented to Situation Alcohol / Substance use:  Not Applicable Psych involvement (Current and /or in the community):  No (Comment)  Discharge Needs  Concerns to be addressed:  Discharge Planning Concerns Readmission within the last 30 days:  No Current discharge risk:  None Barriers to Discharge:  No Barriers Identified   Jenny Torres  [phone] 05/09/2015, 12:57 PM

## 2015-05-09 NOTE — Progress Notes (Signed)
CSW assisting with d/c planning. Pt has chosen [facility] for [employer] following hospital d/c. SNF bed available on 05/10/15 for pt if stable for d/c.  Werner Lean LCSW [phone]

## 2015-05-09 NOTE — Progress Notes (Signed)
Occupational Therapy Treatment Patient Details Name: Jenny Torres MRN: [id number] DOB: 09-15-1928 Today's Date: 05/09/2015    History of present illness 81 yo female adm 05/08/15 after fall at [facility]; xrays = right superior and inferior pubic rami fractures; PMHx:  HTN COPD, anxiety, depression, ORIF R femur   OT comments  Pt wants rehab at [facility] as she has been there before  Follow Up Recommendations  SNF    Equipment Recommendations  None recommended by OT    Recommendations for Other Services      Precautions / Restrictions Precautions Precautions: Fall Restrictions Weight Bearing Restrictions: No Other Position/Activity Restrictions: WBAT       Mobility Bed Mobility   Bed Mobility: Sit to Supine       Sit to supine: Mod assist   General bed mobility comments: OOB with OT (min assist )  Transfers Overall transfer level: Needs assistance Equipment used: Rolling walker (2 wheeled) Transfers: Sit to/from [facility] Sit to Stand: Mod assist Stand pivot transfers: Mod assist       General transfer comment: verbal cues for hand placement adn control of descent    Balance Overall balance assessment: History of Falls;Needs assistance         Standing balance support: During functional activity;Bilateral upper extremity supported Standing balance-Leahy Scale: Poor                     ADL Overall ADL's : Needs assistance/impaired     Grooming: Sitting;Set up               Lower Body Dressing: Maximal assistance;Sit to/from stand   Toilet Transfer: Moderate assistance;RW;Ambulation;Cueing for safety;Cueing for sequencing   Toileting- Clothing Manipulation and Hygiene: Moderate assistance;Sit to/from stand;Cueing for sequencing;Cueing for safety       Functional mobility during ADLs: Moderate assistance;Maximal assistance;Rolling walker;Cueing for safety General ADL Comments: pt will need ST SNF- pt is  agreeable                Cognition   Behavior During Therapy: WFL for tasks assessed/performed Overall Cognitive Status: Within Functional Limits for tasks assessed                       Extremity/Trunk Assessment  Upper Extremity Assessment Upper Extremity Assessment: Defer to OT evaluation;Generalized weakness   Lower Extremity Assessment Lower Extremity Assessment: Generalized weakness                   Pertinent Vitals/ Pain       Pain Assessment: 0-10 Pain Score: 5  Pain Location: R pelvic/groin area Pain Descriptors / Indicators: Sore Pain Intervention(s): Monitored during session;Limited activity within patient's tolerance;Repositioned;Relaxation  Home Living Family/patient expects to be discharged to:: Skilled nursing facility Living Arrangements: Alone ([facility] I-living)   Type of Home: Apartment Home Access: Ramped entrance     Home Layout: One level               Home Equipment: Walker - 2 wheels;Grab bars - toilet;Grab bars - tub/shower;Shower seat   Additional Comments: husband with dementia/Alz lives at [facility]      Prior Functioning/Environment Level of Independence: Independent with assistive device(s)            Frequency       Progress Toward Goals  OT Goals(current goals can now be found in the care plan section)  Progress towards OT goals: Progressing toward goals  Acute  Rehab OT Goals Patient Stated Goal: [facility] to [facility], after rehab at [facility]  Plan Discharge plan needs to be updated       End of Session Equipment Utilized During Treatment: Rolling walker   Activity Tolerance Patient tolerated treatment well   Patient Left in bed;with call bell/phone within reach   Nurse Communication Mobility status    Functional Assessment Tool Used: clinical observation Functional Limitation: Self care Self Care Current Status (H7026): At least 40 percent but less than 60 percent impaired, limited or  restricted Self Care Goal Status (V7858): At least 1 percent but less than 20 percent impaired, limited or restricted   Time: 1400-1422 OT Time Calculation (min): 22 min  Charges: OT G-codes **NOT FOR INPATIENT CLASS** Functional Assessment Tool Used: clinical observation Functional Limitation: Self care Self Care Current Status (I5027): At least 40 percent but less than 60 percent impaired, limited or restricted Self Care Goal Status (X4128): At least 1 percent but less than 20 percent impaired, limited or restricted OT General Charges $OT Visit: 1 Procedure OT Evaluation $Initial OT Evaluation Tier I: 1 Procedure OT Treatments $Self Care/Home Management : 8-22 mins  Amabel Stmarie, Thereasa Parkin 05/09/2015, 2:29 PM

## 2015-05-09 NOTE — Clinical Social Work Placement (Signed)
   CLINICAL SOCIAL WORK PLACEMENT  NOTE  Date:  05/09/2015  Patient Details  Name: Jenny Torres MRN: [id number] Date of Birth: 1928-06-07  Clinical Social Work is seeking post-discharge placement for this patient at the [place] level of care (*CSW will initial, date and re-position this form in  chart as items are completed):  No   Patient/family provided with [facility] Work Department's list of facilities offering this level of care within the geographic area requested by the patient (or if unable, by the patient's family).  Yes   Patient/family informed of their freedom to choose among providers that offer the needed level of care, that participate in Medicare, Medicaid or managed care program needed by the patient, have an available bed and are willing to accept the patient.  Yes   Patient/family informed of [facility]'s ownership interest in [facility] and [facility], as well as of the fact that they are under no obligation to receive care at these facilities.  PASRR submitted to EDS on       PASRR number received on       Existing PASRR number confirmed on 05/09/15     FL2 transmitted to all facilities in geographic area requested by pt/family on 05/09/15     FL2 transmitted to all facilities within larger geographic area on       Patient informed that his/her managed care company has contracts with or will negotiate with certain facilities, including the following:            Patient/family informed of bed offers received.  Patient chooses bed at       Physician recommends and patient chooses bed at      Patient to be transferred to   on  .  Patient to be transferred to facility by       Patient family notified on   of transfer.  Name of family member notified:        PHYSICIAN       Additional Comment:    _______________________________________________ Luretha Rued, [place] 05/09/2015, 1:07  PM

## 2015-05-09 NOTE — Care Management Note (Signed)
Case Management Note  Patient Details  Name: Jenny Torres MRN: [id number] Date of Birth: [birth year]  Subjective/Objective:                   Pubic bone fx Action/Plan:  Discharge planning Expected Discharge Date:  05/09/15              Expected Discharge Plan:  [place]  In-House Referral:     Discharge planning Services  CM Consult  Post Acute Care Choice:  Home Health Choice offered to:  Patient  DME Arranged:    DME Agency:     HH Arranged:  RN, PT, Nurse's Aide [place] Agency:  [place]  Status of Service:  Completed, signed off  Medicare Important Message Given:    Date Medicare IM Given:    Medicare IM give by:    Date Additional Medicare IM Given:    Additional Medicare Important Message give by:     If discussed at [place] of Stay Meetings, dates discussed:    Additional Comments: CM received call from [place], Tim confirming previous CM note; pt will receive HHPT/RN/Aide/SW from [facility].  No other CM needs were communicated. Dellie Catholic, RN 05/09/2015, 11:26 AM

## 2015-05-09 NOTE — Evaluation (Signed)
Physical Therapy Evaluation Patient Details Name: Jenny Torres MRN: [id number] DOB: 02-Feb-1928 Today's Date: 05/09/2015   History of Present Illness  81 yo female adm 05/08/15 after fall at [facility]; xrays = right superior and inferior pubic rami fractures; PMHx:  HTN COPD, anxiety, depression, ORIF R femur  Clinical Impression  Patient evaluated by Physical Therapy with no further acute PT needs identified. All education has been completed and the patient has no further questions.  See below for any follow-up Physial Therapy or equipment needs. PT is signing off. Thank you for this referral. Pt will benefit form SNF level therapies     Follow Up Recommendations SNF    Equipment Recommendations  None recommended by PT    Recommendations for Other Services       Precautions / Restrictions Precautions Precautions: Fall Restrictions Weight Bearing Restrictions: No Other Position/Activity Restrictions: WBAT      Mobility  Bed Mobility Overal bed mobility: Needs Assistance Bed Mobility: Supine to Sit     Supine to sit: Min assist     General bed mobility comments: OOB with OT (min assist )  Transfers Overall transfer level: Needs assistance Equipment used: Rolling walker (2 wheeled) Transfers: Sit to/from Stand Sit to Stand: Min assist Stand pivot transfers: Mod assist       General transfer comment: verbal cues for hand placement adn control of descent  Ambulation/Gait Ambulation/Gait assistance: Mod assist;Min assist Ambulation Distance (Feet): 20 Feet Assistive device: Rolling walker (2 wheeled) Gait Pattern/deviations: Step-to pattern;Narrow base of support;Shuffle;Decreased step length - right;Decreased step length - left;Antalgic;Trunk flexed     General Gait Details: multi-modal cues for RW position, step length, posture, safety  Stairs            Wheelchair Mobility    Modified Rankin (Stroke Patients Only)       Balance Overall  balance assessment: History of Falls;Needs assistance         Standing balance support: During functional activity;Bilateral upper extremity supported Standing balance-Leahy Scale: Poor                               Pertinent Vitals/Pain Pain Assessment: 0-10 Pain Score: 5  Pain Location: R pelvic/groin area Pain Descriptors / Indicators: Sore;Grimacing;Sharp Pain Intervention(s): Limited activity within patient's tolerance;Monitored during session;Premedicated before session    Home Living Family/patient expects to be discharged to:: Skilled nursing facility Living Arrangements: Alone ([facility] I-living)   Type of Home: Apartment Home Access: Ramped entrance     Home Layout: One level Home Equipment: Walker - 2 wheels;Grab bars - toilet;Grab bars - tub/shower;Shower seat Additional Comments: husband with dementia/Alz lives at [facility]    Prior Function Level of Independence: Independent with assistive device(s)               Hand Dominance        Extremity/Trunk Assessment   Upper Extremity Assessment: Defer to OT evaluation;Generalized weakness           Lower Extremity Assessment: Generalized weakness         Communication   Communication: No difficulties  Cognition Arousal/Alertness: Awake/alert Behavior During Therapy: WFL for tasks assessed/performed Overall Cognitive Status: Within Functional Limits for tasks assessed                      General Comments      Exercises        Assessment/Plan  PT Assessment Patient needs continued PT services  PT Diagnosis Difficulty walking   PT Problem List Decreased strength;Decreased range of motion;Decreased activity tolerance;Decreased mobility;Decreased balance;Pain  PT Treatment Interventions DME instruction;Gait training;Functional mobility training;Therapeutic activities;Therapeutic exercise;Patient/family education   PT Goals (Current goals can be found in the Care  Plan section) Acute Rehab PT Goals Patient Stated Goal: back to [facility], after rehab at [facility] PT Goal Formulation: With patient Time For Goal Achievement: 05/16/15 Potential to Achieve Goals: Good    Frequency Min 3X/week   Barriers to discharge        Co-evaluation               End of Session Equipment Utilized During Treatment: Gait belt   Patient left: with call bell/phone within reach;in chair;with family/visitor present Nurse Communication: Mobility status    Functional Assessment Tool Used: clincal judgement Functional Limitation: Mobility: Walking and moving around Mobility: Walking and Moving Around Current Status [phone]): At least 20 percent but less than 40 percent impaired, limited or restricted Mobility: Walking and Moving Around Goal Status [phone]): At least 1 percent but less than 20 percent impaired, limited or restricted    Time: 1148-1208 PT Time Calculation (min) (ACUTE ONLY): 20 min   Charges:   PT Evaluation $Initial PT Evaluation Tier I: 1 Procedure     PT G Codes:   PT G-Codes **NOT FOR INPATIENT CLASS** Functional Assessment Tool Used: clincal judgement Functional Limitation: Mobility: Walking and moving around Mobility: Walking and Moving Around Current Status ([id number]): At least 20 percent but less than 40 percent impaired, limited or restricted Mobility: Walking and Moving Around Goal Status [phone]): At least 1 percent but less than 20 percent impaired, limited or restricted    [facility] 05/09/2015, 12:31 PM

## 2015-05-10 DIAGNOSIS — I7300 Raynaud's syndrome without gangrene: Secondary | ICD-10-CM | POA: Diagnosis not present

## 2015-05-10 DIAGNOSIS — S32501A Unspecified fracture of right pubis, initial encounter for closed fracture: Secondary | ICD-10-CM | POA: Diagnosis not present

## 2015-05-10 DIAGNOSIS — G609 Hereditary and idiopathic neuropathy, unspecified: Secondary | ICD-10-CM | POA: Diagnosis not present

## 2015-05-10 DIAGNOSIS — I10 Essential (primary) hypertension: Secondary | ICD-10-CM | POA: Diagnosis not present

## 2015-05-10 LAB — CBC
HEMATOCRIT: 30.5 % — AB (ref 36.0–46.0)
Hemoglobin: 10.5 g/dL — ABNORMAL LOW (ref 12.0–15.0)
MCH: 30 pg (ref 26.0–34.0)
MCHC: 34.4 g/dL (ref 30.0–36.0)
MCV: 87.1 fL (ref 78.0–100.0)
Platelets: 162 10*3/uL (ref 150–400)
RBC: 3.5 MIL/uL — ABNORMAL LOW (ref 3.87–5.11)
RDW: 12.6 % (ref 11.5–15.5)
WBC: 8.8 10*3/uL (ref 4.0–10.5)

## 2015-05-10 LAB — BASIC METABOLIC PANEL
ANION GAP: 6 (ref 5–15)
BUN: 15 mg/dL (ref 6–20)
CALCIUM: 8.7 mg/dL — AB (ref 8.9–10.3)
CO2: 26 mmol/L (ref 22–32)
Chloride: 97 mmol/L — ABNORMAL LOW (ref 101–111)
Creatinine, Ser: 0.53 mg/dL (ref 0.44–1.00)
Glucose, Bld: 110 mg/dL — ABNORMAL HIGH (ref 65–99)
Potassium: 4.8 mmol/L (ref 3.5–5.1)
SODIUM: 129 mmol/L — AB (ref 135–145)

## 2015-05-10 MED ORDER — OXYCODONE HCL 5 MG PO TABS: 5.0000 mg | ORAL_TABLET | Freq: Four times a day (QID) | ORAL | Status: DC | PRN

## 2015-05-10 MED ORDER — PROMETHAZINE HCL 12.5 MG PO TABS: 12.5000 mg | ORAL_TABLET | Freq: Four times a day (QID) | ORAL | Status: DC | PRN

## 2015-05-10 MED ORDER — SENNOSIDES-DOCUSATE SODIUM 8.6-50 MG PO TABS: 1.0000 | ORAL_TABLET | Freq: Every day | ORAL | Status: DC

## 2015-05-10 MED ORDER — ACETAMINOPHEN 325 MG PO TABS: 325.0000 mg | ORAL_TABLET | Freq: Four times a day (QID) | ORAL | Status: DC | PRN

## 2015-05-10 MED ORDER — IBUPROFEN 400 MG PO TABS: 400.0000 mg | ORAL_TABLET | Freq: Three times a day (TID) | ORAL | Status: DC | PRN

## 2015-05-10 MED ORDER — SODIUM CHLORIDE 1 G PO TABS: 1.0000 g | ORAL_TABLET | Freq: Two times a day (BID) | ORAL | Status: DC

## 2015-05-10 NOTE — Clinical Social Work Placement (Signed)
   CLINICAL SOCIAL WORK PLACEMENT  NOTE  Date:  05/10/2015  Patient Details  Name: Jenny Torres MRN: [id number] Date of Birth: Jul 21, 1928  Clinical Social Work is seeking post-discharge placement for this patient at the [place] level of care (*CSW will initial, date and re-position this form in  chart as items are completed):  No   Patient/family provided with [facility] Work Department's list of facilities offering this level of care within the geographic area requested by the patient (or if unable, by the patient's family).  Yes   Patient/family informed of their freedom to choose among providers that offer the needed level of care, that participate in Medicare, Medicaid or managed care program needed by the patient, have an available bed and are willing to accept the patient.  Yes   Patient/family informed of [facility]'s ownership interest in [facility] and [facility], as well as of the fact that they are under no obligation to receive care at these facilities.  PASRR submitted to EDS on       PASRR number received on       Existing PASRR number confirmed on 05/09/15     FL2 transmitted to all facilities in geographic area requested by pt/family on 05/09/15     FL2 transmitted to all facilities within larger geographic area on       Patient informed that his/her managed care company has contracts with or will negotiate with certain facilities, including the following:        Yes   Patient/family informed of bed offers received.  Patient chooses bed at [facility]     Physician recommends and patient chooses bed at      Patient to be transferred to [facility] on 05/10/15.  Patient to be transferred to facility by [facility]     Patient family notified on 05/10/15 of transfer.  Name of family member notified:  DAUGHTER     PHYSICIAN       Additional Comment: Pt / daughter are in agreement with d/c to [facility] today.  [facility] transport is required. Pt / daughter are aware that out of pocket costs may be associated with [facility] transport. NSG reviewed d/c summary, scripts, avs. Scripts included in d/c packet. D/C Summary sent to SNF for review prior to d/c.    _______________________________________________ Luretha Rued, [place] 05/10/2015, 1:57 PM

## 2015-05-10 NOTE — Discharge Summary (Signed)
Discharge Summary  Jenny Torres GYK:[id number] DOB: 13-Sep-1928  PCP: Warren Danes, MD  Admit date: 05/08/2015 Discharge date: 05/10/2015  Time spent: >65mins  Recommendations for Outpatient Follow-up:  1. F/u with PMD , patient prefer Dr. Mare Ferrari to be her primary care doctor.  Discharge Diagnoses:  Active Hospital Problems   Diagnosis Date Noted  . Pubic bone fracture 05/08/2015  . HTN (hypertension) 12/02/2014  . Idiopathic peripheral neuropathy   . Raynaud's disease 04/24/2011    Resolved Hospital Problems   Diagnosis Date Noted Date Resolved  No resolved problems to display.    Discharge Condition: stable  Diet recommendation: regular diet  Filed Weights   05/08/15 2059  Weight: 95 lb (43.092 kg)    History of present illness:  Jenny Torres is a 81 y.o. female with Past medical history of idiopathic peripheral neuropathy, Raynaud's phenomenon, essential hypertension, GERD, osteoporosis. The patient is presenting with a mechanical fall. The patient was at assisted living facility. while transferring from her bed to the walker she lost her balance and fell on the ground. She also had head injury. She denies having any loss of consciousness or any neck pain. She complained of pain on her right side of the hip and also some right elbow injury and was brought here for further workup. At the time of my evaluation the patient mentions the pain is tolerable when she is resting in the bed and it is significantly worse when she is trying to ambulate. She denies any complaints of dizziness and lightheadedness no vision changeschanges on choking episode no new focal deficit no chest pain no abdominal pain no nausea no shortness of breath no recent fever chills cough burning urination diarrhea or constipation. She also denies any recent change in her medications.  The patient is coming from home.  At her baseline ambulates with a walker And is independent for most of  her ADL and manages her medication on her own.  Hospital Course:  Principal Problem:   Pubic bone fracture Active Problems:   Raynaud's disease   Idiopathic peripheral neuropathy   HTN (hypertension)  1. Pubic bone fracture S/p a mechanical fall. CT of the head as well as C-spine is negative for any acute abnormality. right-sided hip pain and x-ray of the hip is showing acute right superior and inferior pubic rami fracture without any new acute femur or hip fracture. Prior stable right femur ORIF. pain management/ PTOT /SNF   2. Raynaud's disease and idiopathic peripheral neuropathy. As per the patient this is the cause of her frequent falls and gait disturbances. As per the patient she has extensive workup and has been diagnosed with idiopathic neuropathy. continue gabapentin,ambulating with a walker at baseline  3. Essential hypertension. Blood pressure stable continue home medications.  4. Mild hyponatremia. Reported this is a chronic problem known to her, she was told to fluids restrict, but she reported she likes to drink water. Received brief IV hydration.   trial of salt tabs   5. Constipation. Senna-s  for bowel regimen.  Code Status: DNR/DNI, verified by admitting MD  Family Communication: patient   Disposition Plan: rehab  Consultants:  none  Procedures:  none  Antibiotics:  none   Discharge Exam: BP 145/69 mmHg  Pulse 76  Temp(Src) 98.3 F (36.8 C) (Oral)  Resp 16  Ht 5\' 2"  (1.575 m)  Wt 95 lb (43.092 kg)  BMI 17.37 kg/m2  SpO2 97%   General: Frail, NAD  Cardiovascular: RRR  Respiratory:  CTABL  Abdomen: Soft/ND/NT, positive BS  Musculoskeletal: mild pubic bone tenderness to the right , No Edema  Neuro: aaox3  Skin: scattered mild erythema from "sugar ant bite" nontender, not itchy, no open wounds.   Discharge Instructions You were cared for by a hospitalist during your hospital stay. If you have any questions about  your discharge medications or the care you received while you were in the hospital after you are discharged, you can call the unit and asked to speak with the hospitalist on call if the hospitalist that took care of you is not available. Once you are discharged, your primary care physician will handle any further medical issues. Please note that NO REFILLS for any discharge medications will be authorized once you are discharged, as it is imperative that you return to your primary care physician (or establish a relationship with a primary care physician if you do not have one) for your aftercare needs so that they can reassess your need for medications and monitor your lab values.      Discharge Instructions    Diet - low sodium heart healthy    Complete by:  As directed      Increase activity slowly    Complete by:  As directed             Medication List    TAKE these medications        acetaminophen 325 MG tablet  Commonly known as:  TYLENOL  Take 1 tablet (325 mg total) by mouth every 6 (six) hours as needed for mild pain (or Fever >/= 101).     albuterol 108 (90 BASE) MCG/ACT inhaler  Commonly known as:  PROVENTIL HFA;VENTOLIN HFA  Inhale 1-2 puffs into the lungs every 6 (six) hours as needed for wheezing or shortness of breath.     amLODipine 2.5 MG tablet  Commonly known as:  NORVASC  Take 1 tablet (2.5 mg total) by mouth daily.     aspirin 81 MG tablet  Take 81 mg by mouth daily.     feeding supplement (ENSURE COMPLETE) Liqd  Take 237 mLs by mouth 2 (two) times daily between meals.     gabapentin 800 MG tablet  Commonly known as:  NEURONTIN  Take 1 tablet (800 mg total) by mouth 2 (two) times daily.     ibuprofen 400 MG tablet  Commonly known as:  ADVIL,MOTRIN  Take 1 tablet (400 mg total) by mouth every 8 (eight) hours as needed for mild pain or moderate pain.     metoprolol tartrate 25 MG tablet  Commonly known as:  LOPRESSOR  TAKE ONE-HALF (1/2) TABLET TWICE A  DAY     oxyCODONE 5 MG immediate release tablet  Commonly known as:  ROXICODONE  Take 1 tablet (5 mg total) by mouth every 6 (six) hours as needed for severe pain.     promethazine 12.5 MG tablet  Commonly known as:  PHENERGAN  Take 1 tablet (12.5 mg total) by mouth every 6 (six) hours as needed for nausea or vomiting.     senna-docusate 8.6-50 MG per tablet  Commonly known as:  Senokot-S  Take 1 tablet by mouth at bedtime.     sertraline 25 MG tablet  Commonly known as:  ZOLOFT  TAKE 1 TABLET DAILY     sodium chloride 1 G tablet  Take 1 tablet (1 g total) by mouth 2 (two) times daily with a meal.     Vitamin D3 1000 UNITS  Caps  Take 1,000 Units by mouth daily.     zolpidem 5 MG tablet  Commonly known as:  AMBIEN  Take 1 tablet (5 mg total) by mouth at bedtime as needed for sleep.       Allergies  Allergen Reactions  . Lovastatin Other (See Comments)    unknown  . Sulfamethoxazole Swelling    Swelling of the throat, mouth, tongue, nose  . Darvocet [Propoxyphene N-Acetaminophen] Itching  . Erythromycin Nausea And Vomiting    Upset stomach  . Hydrocod Polst-Cpm Polst Er Nausea Only    nausea  . Penicillins Other (See Comments)    unknown  . Theophyllines Other (See Comments)    Unknown reaction  . Zovirax [Acyclovir] Other (See Comments)    Per patient lots of side effects  . Ceftin Diarrhea    GI problems  . Paroxetine Hcl Other (See Comments)    jittery   Follow-up Information    Follow up with [facility]   Why:  home health physical therapy, nurse, aide, social worker   Contact information:   [address] [place] [address] [phone]       Follow up with Warren Danes, MD In 2 weeks.   Specialty:  Cardiology   Why:  hospital discharge follow up   Contact information:   [address] [place] [address] [phone]        The results of significant diagnostics from this hospitalization (including  imaging, microbiology, ancillary and laboratory) are listed below for reference.    Significant Diagnostic Studies: Ct Head Wo Contrast  05/08/2015   CLINICAL DATA:  Status post fall today. Dizziness, headache and hematoma on the head. Initial encounter.  EXAM: CT HEAD WITHOUT CONTRAST  CT CERVICAL SPINE WITHOUT CONTRAST  TECHNIQUE: Multidetector CT imaging of the head and cervical spine was performed following the standard protocol without intravenous contrast. Multiplanar CT image reconstructions of the cervical spine were also generated.  COMPARISON:  Head and cervical spine CT scans 02/11/2014. Head CT scan 12/02/2014.  FINDINGS: CT HEAD FINDINGS  Hypoattenuation in the subcortical and periventricular deep white matter consistent with chronic microvascular ischemic change is again seen. No evidence of acute abnormality including hemorrhage, infarct, mass lesion, mass effect, midline shift or abnormal extra-axial fluid collection is identified. Scalp hematoma over the right parietal bone is noted. There is no fracture. Imaged paranasal sinuses and mastoid air cells are clear.  CT CERVICAL SPINE FINDINGS  Vertebral body height and alignment are maintained. Subtle lucencies across the C4 pedicles are less conspicuous than on the prior examination and could be secondary to nutrient canal or remote fractures. Lucency through the right C4 lamina described on the prior report is not visible on this exam. The patient has advanced multilevel facet arthropathy. Loss of disc space height and endplate spurring appear worst at C5-6, C6-7 and C7-T1. Apical scarring is noted bilaterally.  IMPRESSION: Scalp hematoma over the right parietal bone without underlying fracture. No other acute abnormality head or cervical spine.  Chronic microvascular ischemic change.  Subtle lucencies through the C4 pedicle seen on the prior examination are less conspicuous today. They may be nutrient canal or less likely remote fractures.   Chronic microvascular ischemic change.   Electronically Signed   By: Inge Rise M.D.   On: 05/08/2015 16:01   Ct Cervical Spine Wo Contrast  05/08/2015   CLINICAL DATA:  Status post fall today. Dizziness, headache and hematoma on the  head. Initial encounter.  EXAM: CT HEAD WITHOUT CONTRAST  CT CERVICAL SPINE WITHOUT CONTRAST  TECHNIQUE: Multidetector CT imaging of the head and cervical spine was performed following the standard protocol without intravenous contrast. Multiplanar CT image reconstructions of the cervical spine were also generated.  COMPARISON:  Head and cervical spine CT scans 02/11/2014. Head CT scan 12/02/2014.  FINDINGS: CT HEAD FINDINGS  Hypoattenuation in the subcortical and periventricular deep white matter consistent with chronic microvascular ischemic change is again seen. No evidence of acute abnormality including hemorrhage, infarct, mass lesion, mass effect, midline shift or abnormal extra-axial fluid collection is identified. Scalp hematoma over the right parietal bone is noted. There is no fracture. Imaged paranasal sinuses and mastoid air cells are clear.  CT CERVICAL SPINE FINDINGS  Vertebral body height and alignment are maintained. Subtle lucencies across the C4 pedicles are less conspicuous than on the prior examination and could be secondary to nutrient canal or remote fractures. Lucency through the right C4 lamina described on the prior report is not visible on this exam. The patient has advanced multilevel facet arthropathy. Loss of disc space height and endplate spurring appear worst at C5-6, C6-7 and C7-T1. Apical scarring is noted bilaterally.  IMPRESSION: Scalp hematoma over the right parietal bone without underlying fracture. No other acute abnormality head or cervical spine.  Chronic microvascular ischemic change.  Subtle lucencies through the C4 pedicle seen on the prior examination are less conspicuous today. They may be nutrient canal or less likely remote  fractures.  Chronic microvascular ischemic change.   Electronically Signed   By: Inge Rise M.D.   On: 05/08/2015 16:01   Dg Hip Unilat  With Pelvis 2-3 Views Right  05/08/2015   CLINICAL DATA:  81 year old female who fell from wheelchair. Right hip pain. Initial encounter.  EXAM: DG HIP (WITH OR WITHOUT PELVIS) 2-3V RIGHT  COMPARISON:  Intraoperative images of the right hip ORIF 02/11/2014.  FINDINGS: Right proximal femur intra medullary rod with interlocking dynamic hip screw and distal interlocking cortical screw re- demonstrated. Hardware appears intact. Associated right hip osteoarthritis. Proximal right femur intact.  Mildly displaced and comminuted fractures of the right superior and inferior pubic rami appear acute. The pelvis elsewhere appears intact. Grossly intact proximal left femur.  IMPRESSION: 1. Acute right superior and inferior pubic rami fractures. 2. Stable proximal right femur ORIF.  Right hip osteoarthritis.   Electronically Signed   By: Genevie Ann M.D.   On: 05/08/2015 14:15    Microbiology: No results found for this or any previous visit (from the past 240 hour(s)).   Labs: Basic Metabolic Panel:  Recent Labs Lab 05/08/15 1604 05/10/15 0542  NA 130* 129*  K 4.2 4.8  CL 93* 97*  CO2 26 26  GLUCOSE 113* 110*  BUN 10 15  CREATININE 0.46 0.53  CALCIUM 9.9 8.7*   Liver Function Tests: No results for input(s): AST, ALT, ALKPHOS, BILITOT, PROT, ALBUMIN in the last 168 hours. No results for input(s): LIPASE, AMYLASE in the last 168 hours. No results for input(s): AMMONIA in the last 168 hours. CBC:  Recent Labs Lab 05/08/15 1604 05/10/15 0542  WBC 15.7* 8.8  NEUTROABS 13.8*  --   HGB 13.8 10.5*  HCT 39.5 30.5*  MCV 87.4 87.1  PLT 212 162   Cardiac Enzymes: No results for input(s): CKTOTAL, CKMB, CKMBINDEX, TROPONINI in the last 168 hours. BNP: BNP (last 3 results) No results for input(s): BNP in the last 8760 hours.  ProBNP (last  3 results) No  results for input(s): PROBNP in the last 8760 hours.  CBG: No results for input(s): GLUCAP in the last 168 hours.     SignedFlorencia Reasons MD, PhD  [facility] 05/10/2015, 9:43 AM

## 2015-05-10 NOTE — Progress Notes (Signed)
Physical Therapy Treatment Patient Details Name: Jenny Torres MRN: [id number] DOB: [birth year] Today's Date: 05/10/2015    History of Present Illness 81 yo female adm 05/08/15 after fall at [facility]; xrays = right superior and inferior pubic rami fractures; PMHx:  HTN COPD, anxiety, depression, ORIF R femur    PT Comments    Patient progressing well, sits and guards  With UE's. Plans for SNF today.  Follow Up Recommendations  SNF     Equipment Recommendations       Recommendations for Other Services       Precautions / Restrictions Precautions Precautions: Fall Restrictions Other Position/Activity Restrictions: WBAT    Mobility  Bed Mobility   Bed Mobility: Supine to Sit     Supine to sit: Mod assist     General bed mobility comments: assist with legs to edge , assist with trunk.  Transfers Overall transfer level: Needs assistance Equipment used: Rolling walker (2 wheeled)   Sit to Stand: Mod assist Stand pivot transfers: Mod assist       General transfer comment: verbal cues for hand placement adn control of descent, assisted with pivot to recliner from bed with a semi stand pivot. then with RW, small pivot steps around to the recliner. antalgic on R, decreased weight placed on R  Ambulation/Gait                 Stairs            Wheelchair Mobility    Modified Rankin (Stroke Patients Only)       Balance                                    Cognition Arousal/Alertness: Awake/alert                          Exercises      General Comments        Pertinent Vitals/Pain Pain Score: 5  Pain Location: R pelvic/groin Pain Descriptors / Indicators: Sore;Jabbing;Stabbing Pain Intervention(s): Limited activity within patient's tolerance;Monitored during session;Premedicated before session;Repositioned    Home Living                      Prior Function            PT Goals (current goals  can now be found in the care plan section) Progress towards PT goals: Progressing toward goals    Frequency  Min 3X/week    PT Plan Current plan remains appropriate    Co-evaluation             End of Session   Activity Tolerance: Patient tolerated treatment well Patient left: in chair;with call bell/phone within reach     Time: 0932-1012 PT Time Calculation (min) (ACUTE ONLY): 40 min  Charges:  $Therapeutic Activity: 23-37 mins $Self Care/Home Management: 8-22                    G Codes:      Claretha Cooper 05/10/2015, 4:16 PM

## 2015-05-13 ENCOUNTER — Non-Acute Institutional Stay (SKILLED_NURSING_FACILITY): Payer: Medicare Other | Admitting: Adult Health

## 2015-05-13 ENCOUNTER — Encounter: Payer: Self-pay | Admitting: Adult Health

## 2015-05-13 DIAGNOSIS — E871 Hypo-osmolality and hyponatremia: Secondary | ICD-10-CM | POA: Diagnosis not present

## 2015-05-13 DIAGNOSIS — D62 Acute posthemorrhagic anemia: Secondary | ICD-10-CM | POA: Diagnosis not present

## 2015-05-13 DIAGNOSIS — I10 Essential (primary) hypertension: Secondary | ICD-10-CM

## 2015-05-13 DIAGNOSIS — G629 Polyneuropathy, unspecified: Secondary | ICD-10-CM | POA: Diagnosis not present

## 2015-05-13 DIAGNOSIS — S32501S Unspecified fracture of right pubis, sequela: Secondary | ICD-10-CM | POA: Diagnosis not present

## 2015-05-13 DIAGNOSIS — F32A Depression, unspecified: Secondary | ICD-10-CM

## 2015-05-13 DIAGNOSIS — K5900 Constipation, unspecified: Secondary | ICD-10-CM | POA: Diagnosis not present

## 2015-05-13 DIAGNOSIS — G4700 Insomnia, unspecified: Secondary | ICD-10-CM | POA: Diagnosis not present

## 2015-05-13 DIAGNOSIS — F329 Major depressive disorder, single episode, unspecified: Secondary | ICD-10-CM | POA: Diagnosis not present

## 2015-05-14 LAB — CBC AND DIFFERENTIAL
HEMATOCRIT: 29 % — AB (ref 36–46)
HEMOGLOBIN: 10.3 g/dL — AB (ref 12.0–16.0)
Platelets: 189 10*3/uL (ref 150–399)
WBC: 8.2 10^3/mL

## 2015-05-14 LAB — BASIC METABOLIC PANEL
BUN: 10 mg/dL (ref 4–21)
Creatinine: 0.3 mg/dL — AB (ref 0.5–1.1)
Glucose: 77 mg/dL
POTASSIUM: 3.9 mmol/L (ref 3.4–5.3)
SODIUM: 131 mmol/L — AB (ref 137–147)

## 2015-05-15 ENCOUNTER — Telehealth: Payer: Self-pay | Admitting: Cardiology

## 2015-05-15 NOTE — Telephone Encounter (Signed)
New Message       [facility] calling stating this is the Final attempt to fill request for pt's Zolpidem. They state that the written order is incomplete and they are needing a verbal order. Please call back within 24 hours.

## 2015-05-15 NOTE — Telephone Encounter (Signed)
Verbal ok given to pharmacy since they did not receive fax

## 2015-05-16 ENCOUNTER — Non-Acute Institutional Stay (SKILLED_NURSING_FACILITY): Payer: Medicare Other | Admitting: Internal Medicine

## 2015-05-16 DIAGNOSIS — S32501S Unspecified fracture of right pubis, sequela: Secondary | ICD-10-CM

## 2015-05-16 DIAGNOSIS — E871 Hypo-osmolality and hyponatremia: Secondary | ICD-10-CM | POA: Diagnosis not present

## 2015-05-16 DIAGNOSIS — R2681 Unsteadiness on feet: Secondary | ICD-10-CM

## 2015-05-16 DIAGNOSIS — I10 Essential (primary) hypertension: Secondary | ICD-10-CM | POA: Diagnosis not present

## 2015-05-16 DIAGNOSIS — D62 Acute posthemorrhagic anemia: Secondary | ICD-10-CM

## 2015-05-16 DIAGNOSIS — S32591S Other specified fracture of right pubis, sequela: Secondary | ICD-10-CM

## 2015-05-16 DIAGNOSIS — E46 Unspecified protein-calorie malnutrition: Secondary | ICD-10-CM

## 2015-05-16 DIAGNOSIS — K5900 Constipation, unspecified: Secondary | ICD-10-CM | POA: Diagnosis not present

## 2015-05-16 DIAGNOSIS — G609 Hereditary and idiopathic neuropathy, unspecified: Secondary | ICD-10-CM | POA: Diagnosis not present

## 2015-05-16 NOTE — Progress Notes (Signed)
Patient ID: CHENA CHOHAN, female   DOB: [birth year], 81 y.o.   MRN: [id number]     [facility]   PCP: Warren Danes, MD  Code Status: DNR/DNI  Allergies  Allergen Reactions  . Lovastatin Other (See Comments)    unknown  . Sulfamethoxazole Swelling    Swelling of the throat, mouth, tongue, nose  . Darvocet [Propoxyphene N-Acetaminophen] Itching  . Erythromycin Nausea And Vomiting    Upset stomach  . Hydrocod Polst-Cpm Polst Er Nausea Only    nausea  . Penicillins Other (See Comments)    unknown  . Theophyllines Other (See Comments)    Unknown reaction  . Zovirax [Acyclovir] Other (See Comments)    Per patient lots of side effects  . Ceftin Diarrhea    GI problems  . Paroxetine Hcl Other (See Comments)    jittery    Chief Complaint  Patient presents with  . New Admit To SNF     HPI:  81 y.o. patient is here for short term rehabilitation post hospital admission from 05/08/15-05/10/15 with mechanical fall. She was diagnosed to have pelvic bone fracture- right superior and inferior pubic rami. She was seen by orthopedic and conservative pain management was recommended. She has PMH of HTN, GERD, osteoporosis, Raynaud's phenomenon, idiopathic peripheral neuropathy among others. She is seen in her room today. Her pain is under control with current regimen.   Review of Systems:  Constitutional: Negative for fever, chills, diaphoresis.  HENT: Negative for headache, congestion, nasal discharge  Eyes: Negative for eye pain, blurred vision, double vision and discharge.  Respiratory: Negative for cough, shortness of breath and wheezing.   Cardiovascular: Negative for chest pain, palpitations, leg swelling.  Gastrointestinal: Negative for heartburn, nausea, vomiting, abdominal pain. Had a bowel movement today Genitourinary: Negative for dysuria Musculoskeletal: Negative for back pain, falls Skin: Negative for itching, rash.  Neurological:  Negative for dizziness, tingling, focal weakness Psychiatric/Behavioral: Negative for depression   Past Medical History  Diagnosis Date  . Anxiety   . Depression   . COPD (chronic obstructive pulmonary disease)   . HTN (hypertension)   . Gastritis   . Polyp of colon   . Hemorrhoids, external   . Diverticula, colon   . GERD (gastroesophageal reflux disease)   . Carotid artery occlusion   . Idiopathic peripheral neuropathy     chronic  . Hyperlipidemia   . MVP (mitral valve prolapse)     stable  . Palpitations   . Osteoporosis   . Hyperlipidemia   . Raynaud's disease   . Anorexia    Past Surgical History  Procedure Laterality Date  . Cholecystectomy    . C-sections      x2  . Intramedullary (im) nail intertrochanteric Right 02/11/2014    Procedure: INTRAMEDULLARY (IM) NAIL INTERTROCHANTRIC;  Surgeon: [name], MD;  Location: [place];  Service: Orthopedics;  Laterality: Right;   Social History:   reports that she quit smoking about 31 years ago. Her smoking use included Cigarettes. She has a 25 pack-year smoking history. She has never used smokeless tobacco. She reports that she drinks alcohol. She reports that she does not use illicit drugs.  Family History  Problem Relation Age of Onset  . Breast cancer Mother   . Liver disease Brother   . Cirrhosis Brother     Medications:   Medication List       This list is accurate as of: 05/16/15 12:43 PM.  Always use your  most recent med list.               acetaminophen 325 MG tablet  Commonly known as:  TYLENOL  Take 1 tablet (325 mg total) by mouth every 6 (six) hours as needed for mild pain (or Fever >/= 101).     albuterol 108 (90 BASE) MCG/ACT inhaler  Commonly known as:  PROVENTIL HFA;VENTOLIN HFA  Inhale 1-2 puffs into the lungs every 6 (six) hours as needed for wheezing or shortness of breath.     amLODipine 2.5 MG tablet  Commonly known as:  NORVASC  Take 1 tablet (2.5 mg total) by mouth daily.      aspirin 81 MG tablet  Take 81 mg by mouth daily.     feeding supplement (ENSURE COMPLETE) Liqd  Take 237 mLs by mouth 2 (two) times daily between meals.     gabapentin 800 MG tablet  Commonly known as:  NEURONTIN  Take 1 tablet (800 mg total) by mouth 2 (two) times daily.     ibuprofen 400 MG tablet  Commonly known as:  ADVIL,MOTRIN  Take 1 tablet (400 mg total) by mouth every 8 (eight) hours as needed for mild pain or moderate pain.     metoprolol tartrate 25 MG tablet  Commonly known as:  LOPRESSOR  TAKE ONE-HALF (1/2) TABLET TWICE A DAY     oxyCODONE 5 MG immediate release tablet  Commonly known as:  ROXICODONE  Take 1 tablet (5 mg total) by mouth every 6 (six) hours as needed for severe pain.     promethazine 12.5 MG tablet  Commonly known as:  PHENERGAN  Take 1 tablet (12.5 mg total) by mouth every 6 (six) hours as needed for nausea or vomiting.     senna-docusate 8.6-50 MG per tablet  Commonly known as:  Senokot-S  Take 1 tablet by mouth at bedtime.     sertraline 25 MG tablet  Commonly known as:  ZOLOFT  TAKE 1 TABLET DAILY     sodium chloride 1 G tablet  Take 1 tablet (1 g total) by mouth 2 (two) times daily with a meal.     Vitamin D3 1000 UNITS Caps  Take 1,000 Units by mouth daily.     zolpidem 5 MG tablet  Commonly known as:  AMBIEN  Take 1 tablet (5 mg total) by mouth at bedtime as needed for sleep.         Physical Exam: Filed Vitals:   05/16/15 1242  BP: 139/77  Pulse: 93  Temp: 97 F (36.1 C)  Resp: 16  SpO2: 93%    General- elderly female, thin built, in no acute distress Head- normocephalic, atraumatic Nose- normal nasal mucosa, no maxillary or frontal sinus tenderness, no nasal discharge Throat- moist mucus membrane  Eyes- PERRLA, EOMI, no pallor, no icterus, no discharge, normal conjunctiva, normal sclera Neck- no cervical lymphadenopathy Cardiovascular- normal s1,s2, no murmurs, palpable dorsalis pedis and radial pulses, trace leg  edema Respiratory- bilateral clear to auscultation, no wheeze, no rhonchi, no crackles, no use of accessory muscles Abdomen- bowel sounds present, soft, non tender Musculoskeletal- able to move all 4 extremities, right leg movement ilicits pain Neurological- no focal deficit, alert and oriented  Skin- warm and dry Psychiatry- normal mood and affect    Labs reviewed: Basic Metabolic Panel:  Recent Labs  03/11/15 1107 05/08/15 1604 05/10/15 0542  NA 126* 130* 129*  K 4.2 4.2 4.8  CL 92* 93* 97*  CO2 29 26  26  GLUCOSE 75 113* 110*  BUN 11 10 15   CREATININE 0.57 0.46 0.53  CALCIUM 9.2 9.9 8.7*   Liver Function Tests:  Recent Labs  11/20/14 1014 12/02/14 0030 03/11/15 1107  AST 22 27 24   ALT 11 15 12   ALKPHOS 125* 135* 112  BILITOT 0.5 0.8 0.7  PROT 6.5 7.1 6.7  ALBUMIN 3.8 3.9 4.0   No results for input(s): LIPASE, AMYLASE in the last 8760 hours. No results for input(s): AMMONIA in the last 8760 hours. CBC:  Recent Labs  12/02/14 0030  12/28/14 1240 05/08/15 1604 05/10/15 0542  WBC 9.9  < > 8.1 15.7* 8.8  NEUTROABS 7.9*  --  5.1 13.8*  --   HGB 12.9  < > 12.6 13.8 10.5*  HCT 36.1  < > 36.7 39.5 30.5*  MCV 85.1  < > 90.1 87.4 87.1  PLT 240  < > 268.0 212 162  < > = values in this interval not displayed. Cardiac Enzymes:  Recent Labs  12/02/14 0827 12/02/14 1240 12/02/14 1913  TROPONINI <0.03 <0.03 <0.03   BNP: Invalid input(s): POCBNP CBG:  Recent Labs  12/02/14 0145  GLUCAP 110*   05/14/15 wbc 8.2, hb 10.3, hct28.6, plt 189, na 131, k 3.9, bun 10, cr 0.32, ca 8.6   Radiological Exams: Ct Head Wo Contrast  05/08/2015   CLINICAL DATA:  Status post fall today. Dizziness, headache and hematoma on the head. Initial encounter.  EXAM: CT HEAD WITHOUT CONTRAST  CT CERVICAL SPINE WITHOUT CONTRAST  TECHNIQUE: Multidetector CT imaging of the head and cervical spine was performed following the standard protocol without intravenous contrast. Multiplanar  CT image reconstructions of the cervical spine were also generated.  COMPARISON:  Head and cervical spine CT scans 02/11/2014. Head CT scan 12/02/2014.  FINDINGS: CT HEAD FINDINGS  Hypoattenuation in the subcortical and periventricular deep white matter consistent with chronic microvascular ischemic change is again seen. No evidence of acute abnormality including hemorrhage, infarct, mass lesion, mass effect, midline shift or abnormal extra-axial fluid collection is identified. Scalp hematoma over the right parietal bone is noted. There is no fracture. Imaged paranasal sinuses and mastoid air cells are clear.  CT CERVICAL SPINE FINDINGS  Vertebral body height and alignment are maintained. Subtle lucencies across the C4 pedicles are less conspicuous than on the prior examination and could be secondary to nutrient canal or remote fractures. Lucency through the right C4 lamina described on the prior report is not visible on this exam. The patient has advanced multilevel facet arthropathy. Loss of disc space height and endplate spurring appear worst at C5-6, C6-7 and C7-T1. Apical scarring is noted bilaterally.  IMPRESSION: Scalp hematoma over the right parietal bone without underlying fracture. No other acute abnormality head or cervical spine.  Chronic microvascular ischemic change.  Subtle lucencies through the C4 pedicle seen on the prior examination are less conspicuous today. They may be nutrient canal or less likely remote fractures.  Chronic microvascular ischemic change.   Electronically Signed   By: Inge Rise M.D.   On: 05/08/2015 16:01   Ct Cervical Spine Wo Contrast  05/08/2015   CLINICAL DATA:  Status post fall today. Dizziness, headache and hematoma on the head. Initial encounter.  EXAM: CT HEAD WITHOUT CONTRAST  CT CERVICAL SPINE WITHOUT CONTRAST  TECHNIQUE: Multidetector CT imaging of the head and cervical spine was performed following the standard protocol without intravenous contrast.  Multiplanar CT image reconstructions of the cervical spine were also generated.  COMPARISON:  Head and cervical spine CT scans 02/11/2014. Head CT scan 12/02/2014.  FINDINGS: CT HEAD FINDINGS  Hypoattenuation in the subcortical and periventricular deep white matter consistent with chronic microvascular ischemic change is again seen. No evidence of acute abnormality including hemorrhage, infarct, mass lesion, mass effect, midline shift or abnormal extra-axial fluid collection is identified. Scalp hematoma over the right parietal bone is noted. There is no fracture. Imaged paranasal sinuses and mastoid air cells are clear.  CT CERVICAL SPINE FINDINGS  Vertebral body height and alignment are maintained. Subtle lucencies across the C4 pedicles are less conspicuous than on the prior examination and could be secondary to nutrient canal or remote fractures. Lucency through the right C4 lamina described on the prior report is not visible on this exam. The patient has advanced multilevel facet arthropathy. Loss of disc space height and endplate spurring appear worst at C5-6, C6-7 and C7-T1. Apical scarring is noted bilaterally.  IMPRESSION: Scalp hematoma over the right parietal bone without underlying fracture. No other acute abnormality head or cervical spine.  Chronic microvascular ischemic change.  Subtle lucencies through the C4 pedicle seen on the prior examination are less conspicuous today. They may be nutrient canal or less likely remote fractures.  Chronic microvascular ischemic change.   Electronically Signed   By: Inge Rise M.D.   On: 05/08/2015 16:01   Dg Hip Unilat  With Pelvis 2-3 Views Right  05/08/2015   CLINICAL DATA:  81 year old female who fell from wheelchair. Right hip pain. Initial encounter.  EXAM: DG HIP (WITH OR WITHOUT PELVIS) 2-3V RIGHT  COMPARISON:  Intraoperative images of the right hip ORIF 02/11/2014.  FINDINGS: Right proximal femur intra medullary rod with interlocking dynamic hip  screw and distal interlocking cortical screw re- demonstrated. Hardware appears intact. Associated right hip osteoarthritis. Proximal right femur intact.  Mildly displaced and comminuted fractures of the right superior and inferior pubic rami appear acute. The pelvis elsewhere appears intact. Grossly intact proximal left femur.  IMPRESSION: 1. Acute right superior and inferior pubic rami fractures. 2. Stable proximal right femur ORIF.  Right hip osteoarthritis.   Electronically Signed   By: Genevie Ann M.D.   On: 05/08/2015 14:15    Assessment/Plan  Unsteady gait Will have her work with physical therapy and occupational therapy team to help with gait training and muscle strengthening exercises.fall precautions. Skin care. Encourage to be out of bed. Blood loss anemia  Right pubic rami fracture Pain management for now. Currently on oxycodone 5 mg q6h prn pain. Continue vitamin d supplement. No changes made. Monitor clinically. To work with therapy team  Constipation On senna s 2 tab bid with miralax bid, had bowel movement today, continue this, hydration encouraged  Hyponatremia improved, continue salt tablet 1 gm bid  Insomnia Continue ambien 5 mg qhs prn  Acute blood loss anemia Monitor h&h, stable on recheck  HTN Stable, continue norvasc 2.5 mg daily, lopressor 12.5 mg bid with baby aspirin, monitor bp  Protein calorie malnutrition Encourage po intake, monitor weight, continue medpass  Idiopathic neuropathy Continue neurontin 800 mg bid  Goals of care: short term rehabilitation   Labs/tests ordered: cbc, bmp in 1 week  Family/ staff Communication: reviewed care plan with patient and nursing supervisor    Blanchie Serve, MD  [facility] [phone] (Monday-Friday 8 am - 5 pm) [phone] (afterhours)

## 2015-05-23 ENCOUNTER — Telehealth: Payer: Self-pay | Admitting: Cardiology

## 2015-05-23 NOTE — Telephone Encounter (Signed)
Spoke with daughter and advised patient should keep her ov next week with NP

## 2015-05-23 NOTE — Telephone Encounter (Signed)
New message      Pt has had a recent fall---talk to [facility]

## 2015-05-24 ENCOUNTER — Encounter: Payer: Self-pay | Admitting: Adult Health

## 2015-05-24 ENCOUNTER — Non-Acute Institutional Stay (SKILLED_NURSING_FACILITY): Payer: Medicare Other | Admitting: Adult Health

## 2015-05-24 DIAGNOSIS — K5900 Constipation, unspecified: Secondary | ICD-10-CM | POA: Diagnosis not present

## 2015-05-24 DIAGNOSIS — G629 Polyneuropathy, unspecified: Secondary | ICD-10-CM | POA: Diagnosis not present

## 2015-05-24 DIAGNOSIS — S32501S Unspecified fracture of right pubis, sequela: Secondary | ICD-10-CM

## 2015-05-24 DIAGNOSIS — G4700 Insomnia, unspecified: Secondary | ICD-10-CM

## 2015-05-24 DIAGNOSIS — F329 Major depressive disorder, single episode, unspecified: Secondary | ICD-10-CM

## 2015-05-24 DIAGNOSIS — E871 Hypo-osmolality and hyponatremia: Secondary | ICD-10-CM

## 2015-05-24 DIAGNOSIS — S32591S Other specified fracture of right pubis, sequela: Secondary | ICD-10-CM

## 2015-05-24 DIAGNOSIS — I10 Essential (primary) hypertension: Secondary | ICD-10-CM

## 2015-05-24 DIAGNOSIS — D62 Acute posthemorrhagic anemia: Secondary | ICD-10-CM | POA: Diagnosis not present

## 2015-05-24 DIAGNOSIS — F32A Depression, unspecified: Secondary | ICD-10-CM

## 2015-05-27 NOTE — Progress Notes (Signed)
Patient ID: Jenny Torres, female   DOB: [birth year], 81 y.o.   MRN: [id number]    DATE:  05/13/15 MRN:  [id number]  BIRTHDAY: [birth year]  Facility:  Nursing Home Location:  [place] Room Number: 1206-P  LEVEL OF CARE:  SNF (31)  Contact Information    Name Relation Home Work [place] Daughter   [phone]   [place] Son          Chief Complaint  Patient presents with  . Hospitalization Follow-up    Pubic bone fracture, hyponatremia, constipation, neuropathy, depression, insomnia and anemia    HISTORY OF PRESENT ILLNESS:  This is an 81 year old female who has been admitted to [facility] on 05/10/15 from [facility]. She has PMH of hypertension, neuropathy and Raynaud's disease she had a fall. CT of head and C-spine is negative for any acute abnormality. Right hip x-ray showed acute right superior and inferior pubic rami fracture.  She has been admitted for a short-term rehabilitation.  PAST MEDICAL HISTORY:  Past Medical History  Diagnosis Date  . Anxiety   . Depression   . COPD (chronic obstructive pulmonary disease)   . HTN (hypertension)   . Gastritis   . Polyp of colon   . Hemorrhoids, external   . Diverticula, colon   . GERD (gastroesophageal reflux disease)   . Carotid artery occlusion   . Idiopathic peripheral neuropathy     chronic  . Hyperlipidemia   . MVP (mitral valve prolapse)     stable  . Palpitations   . Osteoporosis   . Hyperlipidemia   . Raynaud's disease   . Anorexia      CURRENT MEDICATIONS: Reviewed  Patient's Medications  New Prescriptions   No medications on file  Previous Medications   ACETAMINOPHEN (TYLENOL) 325 MG TABLET    Take 1 tablet (325 mg total) by mouth every 6 (six) hours as needed for mild pain (or Fever >/= 101).   ALBUTEROL (PROVENTIL HFA;VENTOLIN HFA) 108 (90 BASE) MCG/ACT INHALER    Inhale 1-2 puffs into the lungs every 6 (six) hours as needed for wheezing  or shortness of breath.   AMLODIPINE (NORVASC) 2.5 MG TABLET    Take 1 tablet (2.5 mg total) by mouth daily.   ASPIRIN 81 MG TABLET    Take 81 mg by mouth daily.     CHOLECALCIFEROL (VITAMIN D3) 1000 UNITS CAPS    Take 1,000 Units by mouth daily.    GABAPENTIN (NEURONTIN) 800 MG TABLET    Take 1 tablet (800 mg total) by mouth 2 (two) times daily.   METOPROLOL TARTRATE (LOPRESSOR) 25 MG TABLET    TAKE ONE-HALF (1/2) TABLET TWICE A DAY   OXYCODONE (ROXICODONE) 5 MG IMMEDIATE RELEASE TABLET    Take 1 tablet (5 mg total) by mouth every 6 (six) hours as needed for severe pain.   POLYETHYLENE GLYCOL (MIRALAX / GLYCOLAX) PACKET    Take 17 g by mouth 2 (two) times daily.   PROMETHAZINE (PHENERGAN) 12.5 MG TABLET    Take 1 tablet (12.5 mg total) by mouth every 6 (six) hours as needed for nausea or vomiting.   SERTRALINE (ZOLOFT) 25 MG TABLET    TAKE 1 TABLET DAILY   SODIUM CHLORIDE 1 G TABLET    Take 1 tablet (1 g total) by mouth 2 (two) times daily with a meal.   UNABLE TO FIND    Med Name: Medpass 120 mL by mouth  twice daily between meals   ZOLPIDEM (AMBIEN) 5 MG TABLET    Take 1 tablet (5 mg total) by mouth at bedtime as needed for sleep.  Modified Medications   Modified Medication Previous Medication   SENNA-DOCUSATE (SENOKOT-S) 8.6-50 MG PER TABLET senna-docusate (SENOKOT-S) 8.6-50 MG per tablet      Take 2 tablets by mouth 2 (two) times daily.    Take 1 tablet by mouth at bedtime.  Discontinued Medications   FEEDING SUPPLEMENT, ENSURE COMPLETE, (ENSURE COMPLETE) LIQD    Take 237 mLs by mouth 2 (two) times daily between meals.   IBUPROFEN (ADVIL,MOTRIN) 400 MG TABLET    Take 1 tablet (400 mg total) by mouth every 8 (eight) hours as needed for mild pain or moderate pain.    Allergies  Allergen Reactions  . Lovastatin Other (See Comments)    unknown  . Sulfamethoxazole Swelling    Swelling of the throat, mouth, tongue, nose  . Darvocet [Propoxyphene N-Acetaminophen] Itching  . Erythromycin  Nausea And Vomiting    Upset stomach  . Hydrocod Polst-Cpm Polst Er Nausea Only    nausea  . Penicillins Other (See Comments)    unknown  . Theophyllines Other (See Comments)    Unknown reaction  . Zovirax [Acyclovir] Other (See Comments)    Per patient lots of side effects  . Ceftin Diarrhea    GI problems  . Paroxetine Hcl Other (See Comments)    jittery     REVIEW OF SYSTEMS:  GENERAL: no change in appetite, no fatigue, no weight changes, no fever, chills or weakness EYES: Denies change in vision, dry eyes, eye pain, itching or discharge EARS: Denies change in hearing, ringing in ears, or earache NOSE: Denies nasal congestion or epistaxis MOUTH and THROAT: Denies oral discomfort, gingival pain or bleeding, pain from teeth or hoarseness   RESPIRATORY: no cough, SOB, DOE, wheezing, hemoptysis CARDIAC: no chest pain, edema or palpitations GI: no abdominal pain, diarrhea, heart burn, nausea or vomiting, +constipation GU: Denies dysuria, frequency, hematuria, incontinence, or discharge PSYCHIATRIC: Denies feeling of depression or anxiety. No report of hallucinations, insomnia, paranoia, or agitation   PHYSICAL EXAMINATION  GENERAL APPEARANCE: Well nourished. In no acute distress. Normal body habitus HEAD: Normal in size and contour. No evidence of trauma EYES: Lids open and close normally. No blepharitis, entropion or ectropion. PERRL. Conjunctivae are clear and sclerae are white. Lenses are without opacity EARS: Pinnae are normal. Patient hears normal voice tunes of the examiner MOUTH and THROAT: Lips are without lesions. Oral mucosa is moist and without lesions. Tongue is normal in shape, size, and color and without lesions NECK: supple, trachea midline, no neck masses, no thyroid tenderness, no thyromegaly LYMPHATICS: no LAN in the neck, no supraclavicular LAN RESPIRATORY: breathing is even & unlabored, BS CTAB CARDIAC: RRR, no murmur,no extra heart sounds, no edema GI:  abdomen soft, normal BS, no masses, no tenderness, no hepatomegaly, no splenomegaly EXTREMITIES:  Able to move 4 extremities PSYCHIATRIC: Alert and oriented X 3. Affect and behavior are appropriate  LABS/RADIOLOGY: Labs reviewed: Basic Metabolic Panel:  Recent Labs  03/11/15 1107 05/08/15 1604 05/10/15 0542 05/14/15  NA 126* 130* 129* 131*  K 4.2 4.2 4.8 3.9  CL 92* 93* 97*  --   CO2 29 26 26   --   GLUCOSE 75 113* 110*  --   BUN 11 10 15 10   CREATININE 0.57 0.46 0.53 0.3*  CALCIUM 9.2 9.9 8.7*  --    Liver Function Tests:  Recent Labs  11/20/14 1014 12/02/14 0030 03/11/15 1107  AST 22 27 24   ALT 11 15 12   ALKPHOS 125* 135* 112  BILITOT 0.5 0.8 0.7  PROT 6.5 7.1 6.7  ALBUMIN 3.8 3.9 4.0   CBC:  Recent Labs  12/02/14 0030  12/28/14 1240 05/08/15 1604 05/10/15 0542 05/14/15  WBC 9.9  < > 8.1 15.7* 8.8 8.2  NEUTROABS 7.9*  --  5.1 13.8*  --   --   HGB 12.9  < > 12.6 13.8 10.5* 10.3*  HCT 36.1  < > 36.7 39.5 30.5* 29*  MCV 85.1  < > 90.1 87.4 87.1  --   PLT 240  < > 268.0 212 162 189  < > = values in this interval not displayed.  Lipid Panel:  Recent Labs  11/20/14 1014 03/11/15 1107  HDL 71.50 74.50   Cardiac Enzymes:  Recent Labs  12/02/14 0827 12/02/14 1240 12/02/14 1913  TROPONINI <0.03 <0.03 <0.03   CBG:  Recent Labs  12/02/14 0145  GLUCAP 110*     Ct Head Wo Contrast  05/08/2015   CLINICAL DATA:  Status post fall today. Dizziness, headache and hematoma on the head. Initial encounter.  EXAM: CT HEAD WITHOUT CONTRAST  CT CERVICAL SPINE WITHOUT CONTRAST  TECHNIQUE: Multidetector CT imaging of the head and cervical spine was performed following the standard protocol without intravenous contrast. Multiplanar CT image reconstructions of the cervical spine were also generated.  COMPARISON:  Head and cervical spine CT scans 02/11/2014. Head CT scan 12/02/2014.  FINDINGS: CT HEAD FINDINGS  Hypoattenuation in the subcortical and periventricular  deep white matter consistent with chronic microvascular ischemic change is again seen. No evidence of acute abnormality including hemorrhage, infarct, mass lesion, mass effect, midline shift or abnormal extra-axial fluid collection is identified. Scalp hematoma over the right parietal bone is noted. There is no fracture. Imaged paranasal sinuses and mastoid air cells are clear.  CT CERVICAL SPINE FINDINGS  Vertebral body height and alignment are maintained. Subtle lucencies across the C4 pedicles are less conspicuous than on the prior examination and could be secondary to nutrient canal or remote fractures. Lucency through the right C4 lamina described on the prior report is not visible on this exam. The patient has advanced multilevel facet arthropathy. Loss of disc space height and endplate spurring appear worst at C5-6, C6-7 and C7-T1. Apical scarring is noted bilaterally.  IMPRESSION: Scalp hematoma over the right parietal bone without underlying fracture. No other acute abnormality head or cervical spine.  Chronic microvascular ischemic change.  Subtle lucencies through the C4 pedicle seen on the prior examination are less conspicuous today. They may be nutrient canal or less likely remote fractures.  Chronic microvascular ischemic change.   Electronically Signed   By: Inge Rise M.D.   On: 05/08/2015 16:01   Ct Cervical Spine Wo Contrast  05/08/2015   CLINICAL DATA:  Status post fall today. Dizziness, headache and hematoma on the head. Initial encounter.  EXAM: CT HEAD WITHOUT CONTRAST  CT CERVICAL SPINE WITHOUT CONTRAST  TECHNIQUE: Multidetector CT imaging of the head and cervical spine was performed following the standard protocol without intravenous contrast. Multiplanar CT image reconstructions of the cervical spine were also generated.  COMPARISON:  Head and cervical spine CT scans 02/11/2014. Head CT scan 12/02/2014.  FINDINGS: CT HEAD FINDINGS  Hypoattenuation in the subcortical and  periventricular deep white matter consistent with chronic microvascular ischemic change is again seen. No evidence of acute abnormality including hemorrhage,  infarct, mass lesion, mass effect, midline shift or abnormal extra-axial fluid collection is identified. Scalp hematoma over the right parietal bone is noted. There is no fracture. Imaged paranasal sinuses and mastoid air cells are clear.  CT CERVICAL SPINE FINDINGS  Vertebral body height and alignment are maintained. Subtle lucencies across the C4 pedicles are less conspicuous than on the prior examination and could be secondary to nutrient canal or remote fractures. Lucency through the right C4 lamina described on the prior report is not visible on this exam. The patient has advanced multilevel facet arthropathy. Loss of disc space height and endplate spurring appear worst at C5-6, C6-7 and C7-T1. Apical scarring is noted bilaterally.  IMPRESSION: Scalp hematoma over the right parietal bone without underlying fracture. No other acute abnormality head or cervical spine.  Chronic microvascular ischemic change.  Subtle lucencies through the C4 pedicle seen on the prior examination are less conspicuous today. They may be nutrient canal or less likely remote fractures.  Chronic microvascular ischemic change.   Electronically Signed   By: Inge Rise M.D.   On: 05/08/2015 16:01   Dg Hip Unilat  With Pelvis 2-3 Views Right  05/08/2015   CLINICAL DATA:  81 year old female who fell from wheelchair. Right hip pain. Initial encounter.  EXAM: DG HIP (WITH OR WITHOUT PELVIS) 2-3V RIGHT  COMPARISON:  Intraoperative images of the right hip ORIF 02/11/2014.  FINDINGS: Right proximal femur intra medullary rod with interlocking dynamic hip screw and distal interlocking cortical screw re- demonstrated. Hardware appears intact. Associated right hip osteoarthritis. Proximal right femur intact.  Mildly displaced and comminuted fractures of the right superior and inferior  pubic rami appear acute. The pelvis elsewhere appears intact. Grossly intact proximal left femur.  IMPRESSION: 1. Acute right superior and inferior pubic rami fractures. 2. Stable proximal right femur ORIF.  Right hip osteoarthritis.   Electronically Signed   By: Genevie Ann M.D.   On: 05/08/2015 14:15    ASSESSMENT/PLAN:  Pubic rami fracture - for rehabilitation; continue oxycodone 5 mg 1 tab by mouth every 6 hours when necessary for pain  Hypertension - well controlled; continue Norvasc 2.5 mg 1 tab by mouth daily and metoprolol 25 mg 1/2 tab = 12.5 mg by mouth twice a day; check BMP  Hyponatremia -  sodium 129; continue NaCl 1 g by mouth BIDac  Constipation - increase senna S 2 tabs by mouth BID and start MiraLAX 17 g by mouth twice a day  Neuropathy - continue Neurontin 800 mg 1 tab by mouth twice a day  Depression - mood this is stable; continue Zoloft 25 mg 1 tab by mouth daily  Insomnia - continue Ambien 5 mg 1 tab by mouth daily at bedtime when necessary  Anemia, acute blood loss - hemoglobin 10.5; check CBC     Goals of care:  Short-term rehabilitation    [facility] [place], NP [facility] [phone]

## 2015-05-27 NOTE — Progress Notes (Signed)
Patient ID: Jenny Torres, female   DOB: 01-Sep-1928, 81 y.o.   MRN: [id number]    DATE:  05/24/15 MRN:  [id number]  BIRTHDAY: 1927/10/12  Facility:  [facility]ursing Home Location:  [place] Room Number: 1206-P  LEVEL OF CARE:  SNF (31)  Contact Information    Name Relation Home Work [place] Daughter   [phone]   [place] Son          Chief Complaint  Patient presents with  . Discharge Note    Pubic bone fracture, hyponatremia, constipation, neuropathy, depression, insomnia and anemia    HISTORY OF PRESENT ILLNESS:  This is an 81 year old female who is for discharge home with Home health PT and OT. She has been admitted to [facility] on 05/10/15 from [facility]. She has PMH of hypertension, neuropathy and Raynaud's disease. She had a fall. CT of head and C-spine is negative for any acute abnormality. Right hip x-ray showed acute right superior and inferior pubic rami fracture.  She has been admitted for a short-term rehabilitation.  PAST MEDICAL HISTORY:  Past Medical History  Diagnosis Date  . Anxiety   . Depression   . COPD (chronic obstructive pulmonary disease)   . HTN (hypertension)   . Gastritis   . Polyp of colon   . Hemorrhoids, external   . Diverticula, colon   . GERD (gastroesophageal reflux disease)   . Carotid artery occlusion   . Idiopathic peripheral neuropathy     chronic  . Hyperlipidemia   . MVP (mitral valve prolapse)     stable  . Palpitations   . Osteoporosis   . Hyperlipidemia   . Raynaud's disease   . Anorexia      CURRENT MEDICATIONS: Reviewed  Patient's Medications  New Prescriptions   No medications on file  Previous Medications   ACETAMINOPHEN (TYLENOL) 325 MG TABLET    Take 1 tablet (325 mg total) by mouth every 6 (six) hours as needed for mild pain (or Fever >/= 101).   ALBUTEROL (PROVENTIL HFA;VENTOLIN HFA) 108 (90 BASE) MCG/ACT INHALER    Inhale 1-2 puffs into the  lungs every 6 (six) hours as needed for wheezing or shortness of breath.   AMLODIPINE (NORVASC) 2.5 MG TABLET    Take 1 tablet (2.5 mg total) by mouth daily.   ASPIRIN 81 MG TABLET    Take 81 mg by mouth daily.     CHOLECALCIFEROL (VITAMIN D3) 1000 UNITS CAPS    Take 1,000 Units by mouth daily.    GABAPENTIN (NEURONTIN) 800 MG TABLET    Take 1 tablet (800 mg total) by mouth 2 (two) times daily.   METOPROLOL TARTRATE (LOPRESSOR) 25 MG TABLET    TAKE ONE-HALF (1/2) TABLET TWICE A DAY   OXYCODONE (ROXICODONE) 5 MG IMMEDIATE RELEASE TABLET    Take 1 tablet (5 mg total) by mouth every 6 (six) hours as needed for severe pain.   POLYETHYLENE GLYCOL (MIRALAX / GLYCOLAX) PACKET    Take 17 g by mouth 2 (two) times daily.   PROMETHAZINE (PHENERGAN) 12.5 MG TABLET    Take 1 tablet (12.5 mg total) by mouth every 6 (six) hours as needed for nausea or vomiting.   SENNA-DOCUSATE (SENOKOT-S) 8.6-50 MG PER TABLET    Take 2 tablets by mouth 2 (two) times daily.   SERTRALINE (ZOLOFT) 25 MG TABLET    TAKE 1 TABLET DAILY   SODIUM CHLORIDE 1 G TABLET  Take 1 tablet (1 g total) by mouth 2 (two) times daily with a meal.   UNABLE TO FIND    Med Name: Medpass 120 mL by mouth twice daily between meals   ZOLPIDEM (AMBIEN) 5 MG TABLET    Take 1 tablet (5 mg total) by mouth at bedtime as needed for sleep.  Modified Medications   No medications on file  Discontinued Medications   No medications on file    Allergies  Allergen Reactions  . Lovastatin Other (See Comments)    unknown  . Sulfamethoxazole Swelling    Swelling of the throat, mouth, tongue, nose  . Darvocet [Propoxyphene N-Acetaminophen] Itching  . Erythromycin Nausea And Vomiting    Upset stomach  . Hydrocod Polst-Cpm Polst Er Nausea Only    nausea  . Penicillins Other (See Comments)    unknown  . Theophyllines Other (See Comments)    Unknown reaction  . Zovirax [Acyclovir] Other (See Comments)    Per patient lots of side effects  . Ceftin Diarrhea      GI problems  . Paroxetine Hcl Other (See Comments)    jittery     REVIEW OF SYSTEMS:  GENERAL: no change in appetite, no fatigue, no weight changes, no fever, chills or weakness EYES: Denies change in vision, dry eyes, eye pain, itching or discharge EARS: Denies change in hearing, ringing in ears, or earache NOSE: Denies nasal congestion or epistaxis MOUTH and THROAT: Denies oral discomfort, gingival pain or bleeding, pain from teeth or hoarseness   RESPIRATORY: no cough, SOB, DOE, wheezing, hemoptysis CARDIAC: no chest pain, edema or palpitations GI: no abdominal pain, diarrhea, heart burn, nausea or vomiting GU: Denies dysuria, frequency, hematuria, incontinence, or discharge PSYCHIATRIC: Denies feeling of depression or anxiety. No report of hallucinations, insomnia, paranoia, or agitation   PHYSICAL EXAMINATION  GENERAL APPEARANCE: Well nourished. In no acute distress. Normal body habitus HEAD: Normal in size and contour. No evidence of trauma EYES: Lids open and close normally. No blepharitis, entropion or ectropion. PERRL. Conjunctivae are clear and sclerae are white. Lenses are without opacity EARS: Pinnae are normal. Patient hears normal voice tunes of the examiner MOUTH and THROAT: Lips are without lesions. Oral mucosa is moist and without lesions. Tongue is normal in shape, size, and color and without lesions NECK: supple, trachea midline, no neck masses, no thyroid tenderness, no thyromegaly LYMPHATICS: no LAN in the neck, no supraclavicular LAN RESPIRATORY: breathing is even & unlabored, BS CTAB CARDIAC: RRR, no murmur,no extra heart sounds, no edema GI: abdomen soft, normal BS, no masses, no tenderness, no hepatomegaly, no splenomegaly EXTREMITIES:  Able to move 4 extremities PSYCHIATRIC: Alert and oriented X 3. Affect and behavior are appropriate  LABS/RADIOLOGY: Labs reviewed: Basic Metabolic Panel:  Recent Labs  03/11/15 1107 05/08/15 1604 05/10/15 0542  05/14/15  NA 126* 130* 129* 131*  K 4.2 4.2 4.8 3.9  CL 92* 93* 97*  --   CO2 29 26 26   --   GLUCOSE 75 113* 110*  --   BUN 11 10 15 10   CREATININE 0.57 0.46 0.53 0.3*  CALCIUM 9.2 9.9 8.7*  --    Liver Function Tests:  Recent Labs  11/20/14 1014 12/02/14 0030 03/11/15 1107  AST 22 27 24   ALT 11 15 12   ALKPHOS 125* 135* 112  BILITOT 0.5 0.8 0.7  PROT 6.5 7.1 6.7  ALBUMIN 3.8 3.9 4.0   CBC:  Recent Labs  12/02/14 0030  12/28/14 1240 05/08/15 1604 05/10/15  0542 05/14/15  WBC 9.9  < > 8.1 15.7* 8.8 8.2  NEUTROABS 7.9*  --  5.1 13.8*  --   --   HGB 12.9  < > 12.6 13.8 10.5* 10.3*  HCT 36.1  < > 36.7 39.5 30.5* 29*  MCV 85.1  < > 90.1 87.4 87.1  --   PLT 240  < > 268.0 212 162 189  < > = values in this interval not displayed.  Lipid Panel:  Recent Labs  11/20/14 1014 03/11/15 1107  HDL 71.50 74.50   Cardiac Enzymes:  Recent Labs  12/02/14 0827 12/02/14 1240 12/02/14 1913  TROPONINI <0.03 <0.03 <0.03   CBG:  Recent Labs  12/02/14 0145  GLUCAP 110*     Ct Head Wo Contrast  05/08/2015   CLINICAL DATA:  Status post fall today. Dizziness, headache and hematoma on the head. Initial encounter.  EXAM: CT HEAD WITHOUT CONTRAST  CT CERVICAL SPINE WITHOUT CONTRAST  TECHNIQUE: Multidetector CT imaging of the head and cervical spine was performed following the standard protocol without intravenous contrast. Multiplanar CT image reconstructions of the cervical spine were also generated.  COMPARISON:  Head and cervical spine CT scans 02/11/2014. Head CT scan 12/02/2014.  FINDINGS: CT HEAD FINDINGS  Hypoattenuation in the subcortical and periventricular deep white matter consistent with chronic microvascular ischemic change is again seen. No evidence of acute abnormality including hemorrhage, infarct, mass lesion, mass effect, midline shift or abnormal extra-axial fluid collection is identified. Scalp hematoma over the right parietal bone is noted. There is no fracture.  Imaged paranasal sinuses and mastoid air cells are clear.  CT CERVICAL SPINE FINDINGS  Vertebral body height and alignment are maintained. Subtle lucencies across the C4 pedicles are less conspicuous than on the prior examination and could be secondary to nutrient canal or remote fractures. Lucency through the right C4 lamina described on the prior report is not visible on this exam. The patient has advanced multilevel facet arthropathy. Loss of disc space height and endplate spurring appear worst at C5-6, C6-7 and C7-T1. Apical scarring is noted bilaterally.  IMPRESSION: Scalp hematoma over the right parietal bone without underlying fracture. No other acute abnormality head or cervical spine.  Chronic microvascular ischemic change.  Subtle lucencies through the C4 pedicle seen on the prior examination are less conspicuous today. They may be nutrient canal or less likely remote fractures.  Chronic microvascular ischemic change.   Electronically Signed   By: Inge Rise M.D.   On: 05/08/2015 16:01   Ct Cervical Spine Wo Contrast  05/08/2015   CLINICAL DATA:  Status post fall today. Dizziness, headache and hematoma on the head. Initial encounter.  EXAM: CT HEAD WITHOUT CONTRAST  CT CERVICAL SPINE WITHOUT CONTRAST  TECHNIQUE: Multidetector CT imaging of the head and cervical spine was performed following the standard protocol without intravenous contrast. Multiplanar CT image reconstructions of the cervical spine were also generated.  COMPARISON:  Head and cervical spine CT scans 02/11/2014. Head CT scan 12/02/2014.  FINDINGS: CT HEAD FINDINGS  Hypoattenuation in the subcortical and periventricular deep white matter consistent with chronic microvascular ischemic change is again seen. No evidence of acute abnormality including hemorrhage, infarct, mass lesion, mass effect, midline shift or abnormal extra-axial fluid collection is identified. Scalp hematoma over the right parietal bone is noted. There is no  fracture. Imaged paranasal sinuses and mastoid air cells are clear.  CT CERVICAL SPINE FINDINGS  Vertebral body height and alignment are maintained. Subtle lucencies across the C4  pedicles are less conspicuous than on the prior examination and could be secondary to nutrient canal or remote fractures. Lucency through the right C4 lamina described on the prior report is not visible on this exam. The patient has advanced multilevel facet arthropathy. Loss of disc space height and endplate spurring appear worst at C5-6, C6-7 and C7-T1. Apical scarring is noted bilaterally.  IMPRESSION: Scalp hematoma over the right parietal bone without underlying fracture. No other acute abnormality head or cervical spine.  Chronic microvascular ischemic change.  Subtle lucencies through the C4 pedicle seen on the prior examination are less conspicuous today. They may be nutrient canal or less likely remote fractures.  Chronic microvascular ischemic change.   Electronically Signed   By: Inge Rise M.D.   On: 05/08/2015 16:01   Dg Hip Unilat  With Pelvis 2-3 Views Right  05/08/2015   CLINICAL DATA:  81 year old female who fell from wheelchair. Right hip pain. Initial encounter.  EXAM: DG HIP (WITH OR WITHOUT PELVIS) 2-3V RIGHT  COMPARISON:  Intraoperative images of the right hip ORIF 02/11/2014.  FINDINGS: Right proximal femur intra medullary rod with interlocking dynamic hip screw and distal interlocking cortical screw re- demonstrated. Hardware appears intact. Associated right hip osteoarthritis. Proximal right femur intact.  Mildly displaced and comminuted fractures of the right superior and inferior pubic rami appear acute. The pelvis elsewhere appears intact. Grossly intact proximal left femur.  IMPRESSION: 1. Acute right superior and inferior pubic rami fractures. 2. Stable proximal right femur ORIF.  Right hip osteoarthritis.   Electronically Signed   By: Genevie Ann M.D.   On: 05/08/2015 14:15     ASSESSMENT/PLAN:  Pubic rami fracture - for Home health PT and OT; continue oxycodone 5 mg 1 tab by mouth every 6 hours when necessary for pain  Hypertension - well controlled; continue Norvasc 2.5 mg 1 tab by mouth daily and metoprolol 25 mg 1/2 tab = 12.5 mg by mouth twice a day  Hyponatremia -  sodium 131; continue NaCl 1 g by mouth BIDac  Constipation -continue Senna S 2 tabs by mouth BID and  MiraLAX 17 g by mouth twice a day  Neuropathy - continue Neurontin 800 mg 1 tab by mouth twice a day  Depression - mood this is stable; continue Zoloft 25 mg 1 tab by mouth daily  Insomnia - continue Ambien 5 mg 1 tab by mouth daily at bedtime when necessary  Anemia, acute blood loss - hemoglobin 10.3 stable      I have filled out patient's discharge paperwork and written prescriptions.  Patient will receive home health PTand OT.  Total discharge time: Less than 30 minutes  Discharge time involved coordination of the discharge process with Education officer, museum, nursing staff and therapy department. Medical justification for home health services verified.    [facility], NP [employer] [phone]

## 2015-05-28 ENCOUNTER — Ambulatory Visit: Payer: Medicare Other | Admitting: Podiatry

## 2015-05-28 ENCOUNTER — Encounter: Payer: Self-pay | Admitting: *Deleted

## 2015-05-29 ENCOUNTER — Ambulatory Visit: Payer: Medicare Other | Admitting: Cardiology

## 2015-05-29 ENCOUNTER — Telehealth: Payer: Self-pay | Admitting: Cardiology

## 2015-05-29 NOTE — Telephone Encounter (Signed)
Follow up      Calling to get verbal orders to start PT

## 2015-05-29 NOTE — Telephone Encounter (Signed)
Spoke with patient and ok to talk with daughter Damaris Schooner with patients daughter and patient was sent home back to [facility] Saturday 9/3 from [facility] Per daughter at that point patient had quit walking or getting up without assistance Patient now acting different than she has been, doing things that she had not been doing before (per daughter odd/peculiar behavior) Daughter states that she asks for help to go to the bathroom and then unable to urinate. Then will ask again 10 minutes later. She does this frequently. Patient did have recent fall where she hit her head and had pelvic fracture Advised daughter with the multiple issues her mother needed to go to ED to be reevaluated Discussed with  Dr. Mare Ferrari and he agreed with plan

## 2015-05-29 NOTE — Telephone Encounter (Signed)
New Message  Pt daughter calling to speak w/ Rip Harbour- pt daughter did not go into detail. Please call back and discuss.

## 2015-05-30 NOTE — Telephone Encounter (Signed)
Left message for Danielle to call back.

## 2015-05-31 ENCOUNTER — Encounter (HOSPITAL_COMMUNITY): Payer: Self-pay | Admitting: Emergency Medicine

## 2015-05-31 ENCOUNTER — Telehealth: Payer: Self-pay | Admitting: Cardiology

## 2015-05-31 ENCOUNTER — Emergency Department (HOSPITAL_COMMUNITY): Payer: Medicare Other

## 2015-05-31 ENCOUNTER — Telehealth: Payer: Self-pay

## 2015-05-31 ENCOUNTER — Emergency Department (HOSPITAL_COMMUNITY)
Admission: EM | Admit: 2015-05-31 | Discharge: 2015-05-31 | Disposition: A | Discharge status: Skilled Nursing Facility | Payer: Medicare Other | Attending: Emergency Medicine | Admitting: Emergency Medicine

## 2015-05-31 DIAGNOSIS — F329 Major depressive disorder, single episode, unspecified: Secondary | ICD-10-CM | POA: Insufficient documentation

## 2015-05-31 DIAGNOSIS — F419 Anxiety disorder, unspecified: Secondary | ICD-10-CM | POA: Diagnosis not present

## 2015-05-31 DIAGNOSIS — S329XXD Fracture of unspecified parts of lumbosacral spine and pelvis, subsequent encounter for fracture with routine healing: Secondary | ICD-10-CM

## 2015-05-31 DIAGNOSIS — R269 Unspecified abnormalities of gait and mobility: Secondary | ICD-10-CM | POA: Insufficient documentation

## 2015-05-31 DIAGNOSIS — Z8719 Personal history of other diseases of the digestive system: Secondary | ICD-10-CM | POA: Diagnosis not present

## 2015-05-31 DIAGNOSIS — M84454D Pathological fracture, pelvis, subsequent encounter for fracture with routine healing: Secondary | ICD-10-CM | POA: Diagnosis not present

## 2015-05-31 DIAGNOSIS — Z7982 Long term (current) use of aspirin: Secondary | ICD-10-CM | POA: Diagnosis not present

## 2015-05-31 DIAGNOSIS — I341 Nonrheumatic mitral (valve) prolapse: Secondary | ICD-10-CM | POA: Insufficient documentation

## 2015-05-31 DIAGNOSIS — Z87891 Personal history of nicotine dependence: Secondary | ICD-10-CM | POA: Insufficient documentation

## 2015-05-31 DIAGNOSIS — Z79899 Other long term (current) drug therapy: Secondary | ICD-10-CM | POA: Diagnosis not present

## 2015-05-31 DIAGNOSIS — I10 Essential (primary) hypertension: Secondary | ICD-10-CM | POA: Insufficient documentation

## 2015-05-31 DIAGNOSIS — J449 Chronic obstructive pulmonary disease, unspecified: Secondary | ICD-10-CM | POA: Insufficient documentation

## 2015-05-31 DIAGNOSIS — M810 Age-related osteoporosis without current pathological fracture: Secondary | ICD-10-CM | POA: Insufficient documentation

## 2015-05-31 DIAGNOSIS — Z86010 Personal history of colonic polyps: Secondary | ICD-10-CM | POA: Diagnosis not present

## 2015-05-31 DIAGNOSIS — Z880 Allergy status to penicillin: Secondary | ICD-10-CM | POA: Insufficient documentation

## 2015-05-31 DIAGNOSIS — R339 Retention of urine, unspecified: Secondary | ICD-10-CM | POA: Diagnosis present

## 2015-05-31 DIAGNOSIS — R34 Anuria and oliguria: Secondary | ICD-10-CM | POA: Diagnosis not present

## 2015-05-31 LAB — BASIC METABOLIC PANEL
Anion gap: 6 (ref 5–15)
BUN: 9 mg/dL (ref 6–20)
CHLORIDE: 95 mmol/L — AB (ref 101–111)
CO2: 29 mmol/L (ref 22–32)
CREATININE: 0.51 mg/dL (ref 0.44–1.00)
Calcium: 8.9 mg/dL (ref 8.9–10.3)
GFR calc Af Amer: >60 mL/min (ref >60)
GFR calc non Af Amer: >60 mL/min (ref >60)
GLUCOSE: 69 mg/dL (ref 65–99)
POTASSIUM: 4.3 mmol/L (ref 3.5–5.1)
SODIUM: 130 mmol/L — AB (ref 135–145)

## 2015-05-31 LAB — URINALYSIS, ROUTINE W REFLEX MICROSCOPIC
BILIRUBIN URINE: NEGATIVE
GLUCOSE, UA: NEGATIVE mg/dL
HGB URINE DIPSTICK: NEGATIVE
KETONES UR: NEGATIVE mg/dL
Leukocytes, UA: NEGATIVE
NITRITE: NEGATIVE
PH: 7 (ref 5.0–8.0)
Protein, ur: NEGATIVE mg/dL
SPECIFIC GRAVITY, URINE: 1.01 (ref 1.005–1.030)
Urobilinogen, UA: 1 mg/dL (ref 0.0–1.0)

## 2015-05-31 LAB — CBC
HEMATOCRIT: 33.6 % — AB (ref 36.0–46.0)
Hemoglobin: 11.2 g/dL — ABNORMAL LOW (ref 12.0–15.0)
MCH: 29.9 pg (ref 26.0–34.0)
MCHC: 33.3 g/dL (ref 30.0–36.0)
MCV: 89.6 fL (ref 78.0–100.0)
PLATELETS: 381 10*3/uL (ref 150–400)
RBC: 3.75 MIL/uL — ABNORMAL LOW (ref 3.87–5.11)
RDW: 13.2 % (ref 11.5–15.5)
WBC: 6.6 10*3/uL (ref 4.0–10.5)

## 2015-05-31 MED ORDER — TRAMADOL HCL 50 MG PO TABS
50.0000 mg | ORAL_TABLET | Freq: Once | ORAL | Status: AC
  Administered 2015-05-31: 50 mg via ORAL
  Filled 2015-05-31: qty 1

## 2015-05-31 MED ORDER — TRAMADOL HCL 50 MG PO TABS: 100.0000 mg | ORAL_TABLET | Freq: Four times a day (QID) | ORAL | Status: DC | PRN

## 2015-05-31 NOTE — Discharge Instructions (Signed)
Stable Pelvic Fracture °You have one or more fractures (this means there is a break in the bones) of the pelvis. The pelvis is the ring of bones that make up your hipbones. These are the bones you sit on and the lower part of the spine. It is like a boney ring where your legs attach and which supports your upper body. You have an undisplaced fracture. This means the bones are in good position. The pelvic fracture you have is a simple (uncomplicated) fracture. °DIAGNOSIS  °X-rays usually diagnose these fractures. °TREATMENT  °The goal of treating pelvic fractures is to get the bones to heal in a good position. The patient should return to normal activities as soon as possible. Such fractures are often treated with normal bed rest and conservative measures.  °HOME CARE INSTRUCTIONS  °· You should be on bed rest for as long as directed by your caregiver. Change positions of your legs every 1-2 hours to maintain good blood flow. You may sit as long as is tolerable. Following this, you may do usual activities, but avoid strenuous activities for as long as directed by your caregiver. °· Only take over-the-counter or prescription medicines for pain, discomfort, or fever as directed by your caregiver. °· Bed rest may also be used for discomfort. °· Resume your activities when you are able. Use a cane or crutch on the injured side to reduce pain while walking, as needed. °· If you develop increased pain or discomfort not relieved with medications, contact your caregiver. °· Warning: Do not drive a car or operate a motor vehicle until your caregiver specifically tells you it is safe to do so. °SEEK IMMEDIATE MEDICAL CARE IF:  °· You feel light-headed or faint, develop chest pain or shortness of breath. °· An unexplained oral temperature above 102° F (38.9° C) develops. °· You develop blood in the urine or in the stools. °· There is difficulty urinating, and/or having a bowel movement, or pain with these efforts. °· There is a  difficulty or increased pain with walking. °· There is swelling in one or both legs that is not normal. °Document Released: 11/16/2001 Document Revised: 01/22/2014 Document Reviewed: 04/20/2008 °[employer]® Patient Information ©2015 [employer]. This information is not intended to replace advice given to you by your health care provider. Make sure you discuss any questions you have with your health care provider. ° °

## 2015-05-31 NOTE — Telephone Encounter (Signed)
Verbal order for [facility] given and when I called the daughter she stated she was taking her mother to the ED, no progress made converstation 05/29/15 Donzetta Sprung with [facility]

## 2015-05-31 NOTE — Telephone Encounter (Signed)
Left message to call back  

## 2015-05-31 NOTE — Telephone Encounter (Signed)
New message     Returning your call regarding home health order

## 2015-05-31 NOTE — ED Notes (Signed)
Placed patient on bedpan

## 2015-05-31 NOTE — ED Provider Notes (Signed)
CSN: [id number]     Arrival date & time 05/31/15  1109 History   First MD Initiated Contact with Patient 05/31/15 1311     Chief Complaint  Patient presents with  . Gait Problem  . Urinary Retention  . Constipation     (Consider location/radiation/quality/duration/timing/severity/associated sxs/prior Treatment) HPI Comments: 81 y.o. Female with history of HTN, hyperlipidemia, depression, COPD, recent fall with right pubic rami fractures presents with her daughter for uncontrolled pain and difficulty urinating.  The patient was discharged from a rehab facility on 9/3 and she and her daughter state that since the day before discharge the patient has not been able to walk although before this she could walk up to 100 feet with assistance.  The patient and daughter deny new injury.  The patient does have wounds on her buttocks from laying so much over this time.  She denies nausea, vomiting, fever, chills, chest pain, shortness of breath.  No weakness or numbness.  The patient's daughter also reports that the patient has been asking to urinate more frequently and then not urinating when she wants to.  The daughter spoke to the primary care doctor's office who instructed them to present to the ER and were told she probably needed new xrays and a head CT.  The patient's daughter also reports the patient has not wanted to take pain medication as she thinks the side effects are making it difficult to urinate but that the pain is inhibiting the patient from doing therapy.   Past Medical History  Diagnosis Date  . Anxiety   . Depression   . COPD (chronic obstructive pulmonary disease)   . HTN (hypertension)   . Gastritis   . Polyp of colon   . Hemorrhoids, external   . Diverticula, colon   . GERD (gastroesophageal reflux disease)   . Carotid artery occlusion   . Idiopathic peripheral neuropathy     chronic  . Hyperlipidemia   . MVP (mitral valve prolapse)     stable  . Palpitations   .  Osteoporosis   . Hyperlipidemia   . Raynaud's disease   . Anorexia    Past Surgical History  Procedure Laterality Date  . Cholecystectomy    . Cesarean section      x2  . Intramedullary (im) nail intertrochanteric Right 02/11/2014   Family History  Problem Relation Age of Onset  . Breast cancer Mother   . Liver disease Brother   . Cirrhosis Brother    Social History  Substance Use Topics  . Smoking status: Former Smoker -- 1.00 packs/day for 25 years    Types: Cigarettes    Quit date: 09/22/1983  . Smokeless tobacco: Never Used  . Alcohol Use: Yes     Comment: rarely   OB History    No data available     Review of Systems  Constitutional: Negative for fever, chills, appetite change and fatigue.  HENT: Negative for congestion, postnasal drip and rhinorrhea.   Eyes: Negative for pain and redness.  Respiratory: Negative for cough, chest tightness and shortness of breath.   Cardiovascular: Negative for chest pain, palpitations and leg swelling.  Gastrointestinal: Negative for nausea, vomiting, abdominal pain and diarrhea.  Genitourinary: Positive for decreased urine volume. Negative for dysuria, urgency, frequency and hematuria.  Musculoskeletal: Positive for back pain (lower back) and gait problem. Negative for myalgias.  Neurological: Negative for dizziness, weakness, numbness and headaches.  Hematological: Does not bruise/bleed easily.      Allergies  Lovastatin; Sulfamethoxazole; Darvocet; Erythromycin; Hydrocod polst-cpm polst er; Penicillins; Theophyllines; Zovirax; Ceftin; and Paroxetine hcl  Home Medications   Prior to Admission medications   Medication Sig Start Date End Date Taking? Authorizing Provider  acetaminophen (TYLENOL) 325 MG tablet Take 1 tablet (325 mg total) by mouth every 6 (six) hours as needed for mild pain (or Fever >/= 101). 05/10/15  Yes Florencia Reasons, MD  albuterol (PROVENTIL HFA;VENTOLIN HFA) 108 (90 BASE) MCG/ACT inhaler Inhale 1-2 puffs into  the lungs every 6 (six) hours as needed for wheezing or shortness of breath.   Yes Historical Provider, MD  amLODipine (NORVASC) 2.5 MG tablet Take 1 tablet (2.5 mg total) by mouth daily. 02/07/15  Yes Darlin Coco, MD  aspirin 81 MG tablet Take 81 mg by mouth daily.     Yes Historical Provider, MD  Cholecalciferol (VITAMIN D3) 1000 UNITS CAPS Take 1,000 Units by mouth daily.    Yes Historical Provider, MD  docusate sodium (COLACE) 100 MG capsule Take 100 mg by mouth 2 (two) times daily as needed for mild constipation.   Yes Historical Provider, MD  gabapentin (NEURONTIN) 800 MG tablet Take 1 tablet (800 mg total) by mouth 2 (two) times daily. 12/03/14  Yes Modena Jansky, MD  metoprolol tartrate (LOPRESSOR) 25 MG tablet TAKE ONE-HALF (1/2) TABLET TWICE A DAY Patient taking differently: TAKE 12.5 MG BY MOUTH TWICE DAILY. 04/19/15  Yes Darlin Coco, MD  oxyCODONE (ROXICODONE) 5 MG immediate release tablet Take 1 tablet (5 mg total) by mouth every 6 (six) hours as needed for severe pain. 05/10/15  Yes Florencia Reasons, MD  polyethylene glycol (MIRALAX / GLYCOLAX) packet Take 17 g by mouth daily as needed for moderate constipation.    Yes Historical Provider, MD  promethazine (PHENERGAN) 12.5 MG tablet Take 1 tablet (12.5 mg total) by mouth every 6 (six) hours as needed for nausea or vomiting. 05/10/15  Yes Florencia Reasons, MD  senna-docusate (SENOKOT-S) 8.6-50 MG per tablet Take 1-2 tablets by mouth 2 (two) times daily as needed for moderate constipation.  05/23/15  Yes Monina C Medina-Vargas, NP  sertraline (ZOLOFT) 25 MG tablet TAKE 1 TABLET DAILY 04/19/15  Yes Darlin Coco, MD  traMADol (ULTRAM) 50 MG tablet Take 50 mg by mouth every 6 (six) hours as needed. For pain 05/25/15  Yes Historical Provider, MD  zolpidem (AMBIEN) 5 MG tablet Take 1 tablet (5 mg total) by mouth at bedtime as needed for sleep. 05/02/14  Yes Darlin Coco, MD  sodium chloride 1 G tablet Take 1 tablet (1 g total) by mouth 2 (two) times  daily with a meal. Patient not taking: Reported on 05/31/2015 05/10/15   Florencia Reasons, MD   BP 153/70 mmHg  Pulse 76  Temp(Src) 97.8 F (36.6 C) (Oral)  Resp 17  SpO2 96% Physical Exam  Constitutional: She is oriented to person, place, and time. No distress.  HENT:  Head: Normocephalic and atraumatic.  Right Ear: External ear normal.  Left Ear: External ear normal.  Eyes: EOM are normal. Pupils are equal, round, and reactive to light.  Neck: Normal range of motion. Neck supple. No spinous process tenderness and no muscular tenderness present.  Cardiovascular: Normal rate, regular rhythm and intact distal pulses.   Pulses:      Dorsalis pedis pulses are 2+ on the right side, and 2+ on the left side.  Pulmonary/Chest: Effort normal. No respiratory distress. She has no wheezes. She has no rales.  Abdominal: Soft. She exhibits no distension.  There is no tenderness. There is no rebound.  Musculoskeletal:       Right hip: She exhibits decreased range of motion (pain with flexion and range of motion of the right hip that increases pain in the right lower back as well). She exhibits no crepitus and no deformity.       Left hip: She exhibits normal range of motion, normal strength, no tenderness and no bony tenderness.       Right knee: Normal.       Left knee: Normal.  Neurological: She is alert and oriented to person, place, and time. She has normal strength. No cranial nerve deficit or sensory deficit. She exhibits normal muscle tone. Coordination normal.  No saddle anesthesia on examination  Skin: Skin is warm and dry. She is not diaphoretic.  Vitals reviewed.   ED Course  Procedures (including critical care time) Labs Review Labs Reviewed  BASIC METABOLIC PANEL - Abnormal; Notable for the following:    Sodium 130 (*)    Chloride 95 (*)    All other components within normal limits  CBC - Abnormal; Notable for the following:    RBC 3.75 (*)    Hemoglobin 11.2 (*)    HCT 33.6 (*)    All  other components within normal limits  URINALYSIS, ROUTINE W REFLEX MICROSCOPIC (NOT AT [facility]) - Abnormal; Notable for the following:    APPearance CLOUDY (*)    All other components within normal limits    Imaging Review Ct Head Wo Contrast  05/31/2015   CLINICAL DATA:  [place] in august , weakness unable to get up and walk  EXAM: CT HEAD WITHOUT CONTRAST  TECHNIQUE: Contiguous axial images were obtained from the base of the skull through the vertex without intravenous contrast.  COMPARISON:  05/08/15  FINDINGS: Atrophy with low attenuation in the deep white matter. No change from prior study. No evidence of mass infarct hemorrhage or extra-axial fluid. No skull fracture.  IMPRESSION: Age-related involutional change with no acute findings   Electronically Signed   By: Skipper Cliche M.D.   On: 05/31/2015 14:43   Dg Hip Unilat With Pelvis 2-3 Views Right  05/31/2015   CLINICAL DATA:  Acute right hip pain after fall today. Initial encounter.  EXAM: DG HIP (WITH OR WITHOUT PELVIS) 2-3V RIGHT  COMPARISON:  May 08, 2015.  FINDINGS: Status post surgical fixation of old proximal femoral neck fracture. Severe degenerative joint disease of the right hip is noted. Mild degenerative joint disease of the left hip is noted. Continued presence of acute to subacute fractures involving the right superior and inferior pubic rami. No new fracture is noted since prior exam.  IMPRESSION: Continued presence of acute to subacute fractures involving the right inferior and superior pubic rami. Status post surgical repair of old proximal right femoral neck fracture. Severe degenerative joint disease of right hip is noted.   Electronically Signed   By: Marijo Conception, M.D.   On: 05/31/2015 14:58   I have personally reviewed and evaluated these images and lab results as part of my medical decision-making.   EKG Interpretation None      MDM  Patient seen and evaluated in stable condition.  Bladder scan with minimal to no  urine.  Small amount of urine obtained after oral hydration.  Imaging consistent with previous pubic rami fractures without acute finding.  Laboratory results unremarkable.  Care management was consulted for patient.  Discussed at length with patient, daughter and care  management and at request of patient she was discharged back to her independent living where arrangements will be attempted at increasing physical therapy (script given) and increasing nursing care - care management discussed with independent living.  Patient given increased dose of tramadol for increased pain control.  Patient was discharged home in stable condition. Final diagnoses:  None    1. Stable pelvic fracture  2. Difficult ambulating secondary to pain    Harvel Quale, MD 05/31/15 1746

## 2015-05-31 NOTE — ED Notes (Signed)
[facility] here to take pt back to [facility].

## 2015-05-31 NOTE — Telephone Encounter (Signed)
Discussed with  Dr. Mare Ferrari and verbal ok given for PT

## 2015-05-31 NOTE — ED Notes (Signed)
Unable to void. Bladder Scan shows 0 ML. EDP to be notified

## 2015-05-31 NOTE — Care Management Note (Signed)
Case Management Note  Patient Details  Name: Jenny [facility] MRN: [id number] Date of Birth: 07/26/1928  Subjective/Objective:   Patient recently discharged from [facility].  Now complaining of difficulty ambulating and urinary frequency or incontinence.                 Action/Plan: Discussed increasing services at [facility] with patient and her daughterJennifer   Expected Discharge Date:   05/31/2015               Expected Discharge Plan:  [facility] (Patient from [facility] living at [facility])  In-House Referral:     Discharge planning Services  CM Consult  Post Acute Care Choice:  Durable Medical Equipment Choice offered to:  Patient, [facility] POA / Guardian  DME Arranged:  3-N-1 DME Agency:  [place]:   (none required receiving services with [facility] and has Physical therapy already arranged twice a week.) HH Agency:     Status of Service:  Completed, signed off  Medicare Important Message Given:    Date Medicare IM Given:    Medicare IM give by:    Date Additional Medicare IM Given:    Additional Medicare Important Message give by:     If discussed at [place] of Stay Meetings, dates discussed:    Additional Comments:  Patient is resident at [facility].  Patient's daughter Torres patient was at [facility] for a week and was discharged because she was able to ambulate one hundred feet.  Patient's daughter Torres, "[facility] wouldn't pay for rehab anymore so she was kicked out." Patient's daughter Torres she currently has services with [facility] from within the facility.  Someone comes in the morning helps with breakfast and ADL's, then someone comes in at lunch around 12 noon, someone comes in at dinner time, and at night before she goes to bed.  [facility] spends an hour and a half with patient in the morning, about 15 min  during mid day visits and about 30-45 minutes in the evening.  [facility] "There is no one there from 21md to 7am." [facility] patient can call [facility] if she needs them at any time, "and she has." [facility] also Torres patient has physical therapy two times a week but she does not feel it is enough.  EDCM discussed with patient's daughter about increasing hours with [facility] and PT.  [facility] she has spoken with Actor at [facility].  [facility] patient was unable to stand on Saturday when she returned from [facility] and was only able to walk seven feet with PT at [facility] with [facility].  EDCM assessed for dme at this time.  Patient Torres she has a walker,and a wheelchair at the facility.  Patient requesting a bedside commode.  Patient's daughter is agreeable to this.  [facility] chosen for bedside commode.  [facility] faxed order for 3 in 1 to [facility] with confirmation of receipt.  Patient's daughter Torres if patient continues to decline she will no longer be able to stay in independent living.  EDCM encouraged patient's daughter to speak to social worker at facility if patient requires high level of care.  Patient's daughter Torres she has asked for Assisted Living bed but they do not have any beds open at this time.  Patient prefers to go home, she does not want to go to [facility] or  other facility.  Patient is awake alert and answering questions appropriately.  EDCM called [facility] and spoke to [place] at 1800pm.  [facility] asked to speak to social worker [facility] but she had left for the day.  [facility] Torres he will send email to [facility] with patient's daughter's request for increased hours with [facility] and physical therapy at least five times a week.  Patient's daughter Torres she is aware it will be an out of pocket expense.  EDCM questioned how patient could have declined that quickly after rehab? Patient's daughter smirked and said,"Ha! You don't know my  mother."  EDCM offered support to patient and patient's daughter.  EDCM informed [facility] at [facility] that [place] [facility] will be delivered for patient and RX for PT for five times a week will be on patient's chart when she returns.  [facility] Torres he will be there when patient returns.  RX for PT placed on patient's paperwork to go back to nursing facility.  EDP at bedside with [facility] during conversation with patient and patient's daughter.  No further EDCM needs at this time. [facility], Jenny Ahlgren, RN 05/31/2015, 10:52 PM

## 2015-05-31 NOTE — ED Notes (Signed)
Pt was discharged from the hospital recently. Daughter states she had been improving while here, was discharged over the holiday weekend and hasn't been able to get up and walk at home. Now complaining of pain to back of upper left thigh, sores on sacral area. Daughter states her pain medication was changed when she was here and thinks that a lack in control of the patient's pain has been making it more difficult for her to walk. Also complaining of difficulty with urinary hesitancy/retention and constipation.

## 2015-05-31 NOTE — ED Notes (Signed)
PTAR called to take pt back to [facility].

## 2015-05-31 NOTE — ED Notes (Signed)
Patient transported to X-ray 

## 2015-05-31 NOTE — ED Notes (Signed)
MD at bedside. 

## 2015-05-31 NOTE — Telephone Encounter (Signed)
Follow up    Pt is in the ER  Pt needs to have [place] filled when she goes home  Filled before monday

## 2015-05-31 NOTE — ED Notes (Signed)
[facility] informed that pt is coming back to facility.

## 2015-05-31 NOTE — ED Notes (Signed)
Patient transported to CT 

## 2015-06-07 ENCOUNTER — Other Ambulatory Visit: Payer: Self-pay | Admitting: Cardiology

## 2015-06-07 ENCOUNTER — Other Ambulatory Visit: Payer: Self-pay | Admitting: Internal Medicine

## 2015-06-07 DIAGNOSIS — R52 Pain, unspecified: Secondary | ICD-10-CM

## 2015-06-07 NOTE — Telephone Encounter (Signed)
Follow up     Needing verbal order for occupational therapy 1 time a week for 1 week; 2 times a week for 6 weeks and 1 time a week for 1 week

## 2015-06-07 NOTE — Telephone Encounter (Signed)
New message      Talk to [place].  She would not tell me what she wanted.

## 2015-06-07 NOTE — Telephone Encounter (Signed)
Verbal ok given for OT   Refilled Tramadol 50 mg 1 every 6 hours prn as requested

## 2015-06-07 NOTE — Telephone Encounter (Signed)
Spoke with patient and advised  Dr. Mare Ferrari did not recommend her taking the Lorazepam secondary to her frequent falls

## 2015-06-10 ENCOUNTER — Telehealth: Payer: Self-pay | Admitting: *Deleted

## 2015-06-10 NOTE — Telephone Encounter (Signed)
Did advise patient she needed to get PCP, verbalized understanding

## 2015-06-10 NOTE — Telephone Encounter (Signed)
Left message to call back   Santo Held at 06/10/2015 12:17 PM     Status: Signed       Expand All Collapse All   New Message  [facility] called asked the labs for UAC and S is still needed  Please call [phone]

## 2015-06-10 NOTE — Telephone Encounter (Signed)
[facility] that U/A done in the ED no need to collect

## 2015-06-10 NOTE — Telephone Encounter (Signed)
New Message  [facility] called asked the labs for UAC and S is still needed  Please call [phone]

## 2015-06-11 NOTE — Telephone Encounter (Signed)
Did speak with [facility] yesterday advised U/A done while patient in the ED recently

## 2015-06-14 ENCOUNTER — Telehealth: Payer: Self-pay | Admitting: Cardiology

## 2015-06-14 ENCOUNTER — Ambulatory Visit (INDEPENDENT_AMBULATORY_CARE_PROVIDER_SITE_OTHER): Payer: Medicare Other | Admitting: Family Medicine

## 2015-06-14 VITALS — BP 170/77 | HR 77 | Temp 97.4°F | Resp 16 | Ht 62.0 in | Wt 84.0 lb

## 2015-06-14 DIAGNOSIS — R350 Frequency of micturition: Secondary | ICD-10-CM | POA: Diagnosis not present

## 2015-06-14 DIAGNOSIS — R300 Dysuria: Secondary | ICD-10-CM | POA: Diagnosis not present

## 2015-06-14 LAB — POCT CBC
Granulocyte percent: 58.7 %G (ref 37–80)
HCT, POC: 39 % (ref 37.7–47.9)
HEMOGLOBIN: 12.3 g/dL (ref 12.2–16.2)
LYMPH, POC: 1.8 (ref 0.6–3.4)
MCH, POC: 27.6 pg (ref 27–31.2)
MCHC: 31.6 g/dL — AB (ref 31.8–35.4)
MCV: 87.1 fL (ref 80–97)
MID (cbc): 0.5 (ref 0–0.9)
MPV: 5.6 fL (ref 0–99.8)
POC Granulocyte: 3.3 (ref 2–6.9)
POC LYMPH PERCENT: 33 %L (ref 10–50)
POC MID %: 8.3 % (ref 0–12)
Platelet Count, POC: 389 10*3/uL (ref 142–424)
RBC: 4.47 M/uL (ref 4.04–5.48)
RDW, POC: 13.4 %
WBC: 5.6 10*3/uL (ref 4.6–10.2)

## 2015-06-14 LAB — POCT URINALYSIS DIP (MANUAL ENTRY)
BILIRUBIN UA: NEGATIVE
Blood, UA: NEGATIVE
Glucose, UA: NEGATIVE
Ketones, POC UA: NEGATIVE
NITRITE UA: NEGATIVE
PH UA: 7
Protein Ur, POC: NEGATIVE
Spec Grav, UA: 1.01
Urobilinogen, UA: 0.2

## 2015-06-14 LAB — POC MICROSCOPIC URINALYSIS (UMFC): MUCUS RE: ABSENT

## 2015-06-14 NOTE — Telephone Encounter (Signed)
New Prob    Pt states she has some questions. Did not wish to leave any details. Please call.

## 2015-06-14 NOTE — Progress Notes (Signed)
06/14/2015 at 7:04 PM  Jenny [facility] / DOB: 12-30-1927 / MRN: [id number]  The patient has POLYP, COLON; Dyslipidemia; ANXIETY; Depression; UNSPECIFIED RETINAL VASCULAR OCCLUSION; Essential hypertension; EXTERNAL HEMORRHOIDS; COPD; GASTROESOPHAGEAL REFLUX DISEASE; DIVERTICULOSIS, COLON; Raynaud's disease; Idiopathic peripheral neuropathy; MVP (mitral valve prolapse); Osteoporosis; Pulmonary nodule; Low vitamin B12 level; Femur fracture, right; Intertrochanteric fracture of right hip; Protein-calorie malnutrition, severe; Acute blood loss anemia; Insomnia; Unspecified constipation; Blood loss anemia; Syncope and collapse; Traumatic hematoma of scalp; Hyponatremia; Hyperlipidemia; COPD (chronic obstructive pulmonary disease); HTN (hypertension); Closed head injury; Pelvic fracture; and Pubic bone fracture on her problem list.  SUBJECTIVE  Jenny [facility] is a 81 y.o. cachetic appearing female presenting for the chief complaint of dysuria and urgency that started today.  Denies frequency, hematuria, and she denies flank pain.  She suffered a hip fracture roughly 1 month ago and reports that her urination "has not been right" since that time, and complains of some incontinence and hesitancy since the fracture.  She has not seen a urologist as of yet, however pt's daughter would like her to go given the duration of symptoms.    She has a history of HTN and has not missed her medication today.  Denies HA, nausea, and changes in vision at this time, and overall feels her normal self aside from dysuria and frequency.      She  has a past medical history of Anxiety; Depression; COPD (chronic obstructive pulmonary disease); HTN (hypertension); Gastritis; Polyp of colon; Hemorrhoids, external; Diverticula, colon; GERD (gastroesophageal reflux disease); Carotid artery occlusion; Idiopathic peripheral neuropathy; Hyperlipidemia; MVP (mitral valve prolapse); Palpitations; Osteoporosis; Hyperlipidemia; Raynaud's  disease; and Anorexia.    Medications reviewed and updated by myself where necessary, and exist elsewhere in the encounter.   Ms. Cordner is allergic to lovastatin; sulfamethoxazole; darvocet; erythromycin; hydrocod polst-cpm polst er; penicillins; theophyllines; zovirax; ceftin; and paroxetine hcl. She  reports that she quit smoking about 31 years ago. Her smoking use included Cigarettes. She has a 25 pack-year smoking history. She has never used smokeless tobacco. She reports that she drinks alcohol. She reports that she does not use illicit drugs. She  reports that she does not engage in sexual activity. The patient  has past surgical history that includes Cholecystectomy; Cesarean section; and Intramedullary (im) nail intertrochanteric (Right, 02/11/2014).  Her family history includes Breast cancer in her mother; Cirrhosis in her brother; Liver disease in her brother.  Review of Systems  Constitutional: Negative for fever and chills.  Respiratory: Negative for shortness of breath.   Cardiovascular: Negative for chest pain.  Gastrointestinal: Negative for nausea and abdominal pain.  Genitourinary: Positive for dysuria and urgency. Negative for frequency, hematuria and flank pain.  Skin: Negative for rash.  Neurological: Negative for dizziness and headaches.    OBJECTIVE  Her  height is 5\' 2"  (1.575 m) and weight is 84 lb (38.102 kg). Her oral temperature is 97.4 F (36.3 C). Her blood pressure is 176/63 and her pulse is 77. Her respiration is 16 and oxygen saturation is 98%.  The patient's body mass index is 15.36 kg/(m^2).  Physical Exam  Vitals reviewed. Constitutional: She is oriented to person, place, and time. She appears well-developed and well-nourished. No distress.  Eyes: Pupils are equal, round, and reactive to light.  Cardiovascular: Normal rate and regular rhythm.   Respiratory: Effort normal and breath sounds normal. No respiratory distress.  GI: Soft. Bowel sounds are normal.  She exhibits no distension.  Neurological: She is alert and  oriented to person, place, and time.  Skin: Skin is warm and dry. She is not diaphoretic.  Psychiatric: She has a normal mood and affect. Her behavior is normal. Judgment and thought content normal.    Results for orders placed or performed in visit on 06/14/15 (from the past 24 hour(s))  POCT urinalysis dipstick     Status: Abnormal   Collection Time: 06/14/15  7:03 PM  Result Value Ref Range   Color, UA yellow yellow   Clarity, UA clear clear   Glucose, UA negative negative   Bilirubin, UA negative negative   Ketones, POC UA negative negative   Spec Grav, UA 1.010    Blood, UA negative negative   pH, UA 7.0    Protein Ur, POC negative negative   Urobilinogen, UA 0.2    Nitrite, UA Negative Negative   Leukocytes, UA Trace (A) Negative  POCT Microscopic Urinalysis ([facility])     Status: Abnormal   Collection Time: 06/14/15  7:04 PM  Result Value Ref Range   WBC,UR,HPF,POC Few (A) None WBC/hpf   RBC,UR,HPF,POC None None RBC/hpf   Bacteria None None   Mucus Absent Absent   Epithelial Cells, UR Per Microscopy Few (A) None cells/hpf    ASSESSMENT & [place] was seen today for dysuria.  Diagnoses and all orders for this visit:  Dysuria -     POCT urinalysis dipstick -     POCT Microscopic Urinalysis ([facility])  Urination frequency -     POCT urinalysis dipstick -     POCT Microscopic Urinalysis ([facility])  Urinary tract infection without hematuria, site unspecified -     Urine culture    The patient was advised to call or come back to clinic if she does not see an improvement in symptoms, or worsens with the above plan.   Philis Fendt, MHS, PA-C [facility] 06/14/2015 7:04 PM

## 2015-06-14 NOTE — Telephone Encounter (Signed)
Patient c/o urgency and burning when urinates Stated feels like when she has UTI Advised patient needed to go to Urgent Care for evaluation, verbalized understanding

## 2015-06-16 LAB — URINE CULTURE

## 2015-06-17 NOTE — Telephone Encounter (Signed)
Okay to refill? 

## 2015-06-18 MED ORDER — TRAMADOL HCL 50 MG PO TABS: 50.0000 mg | ORAL_TABLET | Freq: Four times a day (QID) | ORAL | Status: DC | PRN

## 2015-06-30 ENCOUNTER — Other Ambulatory Visit: Payer: Self-pay | Admitting: Cardiology

## 2015-09-03 ENCOUNTER — Other Ambulatory Visit: Payer: Self-pay | Admitting: Cardiology

## 2015-09-03 DIAGNOSIS — M792 Neuralgia and neuritis, unspecified: Secondary | ICD-10-CM

## 2015-10-31 ENCOUNTER — Encounter: Payer: Self-pay | Admitting: Cardiology

## 2015-10-31 ENCOUNTER — Ambulatory Visit (INDEPENDENT_AMBULATORY_CARE_PROVIDER_SITE_OTHER): Payer: Medicare Other | Admitting: Cardiology

## 2015-10-31 VITALS — BP 150/80 | HR 64 | Ht 62.0 in | Wt 91.8 lb

## 2015-10-31 DIAGNOSIS — E7800 Pure hypercholesterolemia, unspecified: Secondary | ICD-10-CM

## 2015-10-31 DIAGNOSIS — I10 Essential (primary) hypertension: Secondary | ICD-10-CM | POA: Diagnosis not present

## 2015-10-31 DIAGNOSIS — R55 Syncope and collapse: Secondary | ICD-10-CM | POA: Diagnosis not present

## 2015-10-31 DIAGNOSIS — I341 Nonrheumatic mitral (valve) prolapse: Secondary | ICD-10-CM | POA: Diagnosis not present

## 2015-10-31 NOTE — Patient Instructions (Signed)
Medication Instructions:  Your physician recommends that you continue on your current medications as directed. Please refer to the Current Medication list given to you today.  Labwork: NONE  Testing/Procedures: NONE  Follow-Up: AT [facility]   If you need a refill on your cardiac medications before your next appointment, please call your pharmacy.

## 2015-10-31 NOTE — Progress Notes (Signed)
Cardiology Office Note   Date:  10/31/2015   ID:  Keeyana, Dial [birth year], MRN [id number]  PCP:  Warren Danes, MD  Cardiologist: Darlin Coco MD  No chief complaint on file.     History of Present Illness: Jenny Torres is a 82 y.o. female who presents for Scheduled follow-up visit  She has a history of MVP, palpitations, COPD, HTN, anxiety & depression, HLD, carotid artery occlusion, B-12 deficiency, peripheral neuropathy and Raynaud's phenomenon. She has had situational depression related to ongoing stress regarding her husband's dementia. Also her son was recently diagnosed with ALS. She has a history of allergies and past history of asthma. She has a past history of low vitamin B12 levels first noted by her neurologist at [facility]. She has vitamin B12 injections on hand at home but has not taken any for the past 6 months or more. She has had difficulty in gaining weight. She is now living at [facility] in her own apartment.  She is gaining weight now.. The patient fell on 02/11/2014 and broke her right hip. She underwent pinning of her hip by Dr. Doran Durand. She spent time in [place] and then has moved to [place] in an apartment. Her children insisted that she move out of her home. Her husband who has dementia is also at [facility] in a different building. Presented to [facility] on 12/01/14 when she got up to either put some food or take out from microwave and abruptly passed out without warning symptoms and remembers waking up on the floor. She had hit her head on the floor. In the ED, sodium 129 and CT head without acute findings. Unclear etiology. Echo was unremarkable.  The carotid Dopplers done on 12/03/14 showed a 60-79% right carotid artery stenosis. Less than 40% on the left. She has been having problems with dry skin. She had a TSH on 12/28/14 which was normal at 2.17 She wonders about getting some immunizations at the nursing home which are being  offered.  They are offering the Prevnar 13 and also the shingles vaccine.  I recommended that she get both of these. The nursing home was also requested a prescription for physical therapy for limb strengthening and balance.  We agree with that and have signed for it.  Past Medical History  Diagnosis Date  . Anxiety   . Depression   . COPD (chronic obstructive pulmonary disease) ([place])   . HTN (hypertension)   . Gastritis   . Polyp of colon   . Hemorrhoids, external   . Diverticula, colon   . GERD (gastroesophageal reflux disease)   . Carotid artery occlusion   . Idiopathic peripheral neuropathy (HCC)     chronic  . Hyperlipidemia   . MVP (mitral valve prolapse)     stable  . Palpitations   . Osteoporosis   . Hyperlipidemia   . Raynaud's disease   . Anorexia     Past Surgical History  Procedure Laterality Date  . Cholecystectomy    . Cesarean section      x2  . Intramedullary (im) nail intertrochanteric Right 02/11/2014     Current Outpatient Prescriptions  Medication Sig Dispense Refill  . albuterol (PROVENTIL HFA;VENTOLIN HFA) 108 (90 BASE) MCG/ACT inhaler Inhale 1-2 puffs into the lungs every 6 (six) hours as needed for wheezing or shortness of breath.    Marland Kitchen amLODipine (NORVASC) 2.5 MG tablet Take 2.5 mg by mouth daily.    Marland Kitchen aspirin 81 MG tablet  Take 81 mg by mouth daily.      . Cholecalciferol (VITAMIN D3) 1000 UNITS CAPS Take 1,000 Units by mouth daily.     Marland Kitchen docusate sodium (COLACE) 100 MG capsule Take 100 mg by mouth 2 (two) times daily as needed for mild constipation.    . gabapentin (NEURONTIN) 800 MG tablet Take 1 tablet (800 mg total) by mouth 2 (two) times daily.    Marland Kitchen ibuprofen (ADVIL,MOTRIN) 800 MG tablet Take 800 mg by mouth every 8 (eight) hours as needed for headache or moderate pain.    . metoprolol tartrate (LOPRESSOR) 25 MG tablet Take 12.5 mg by mouth 2 (two) times daily.    . polyethylene glycol (MIRALAX / GLYCOLAX) packet Take 17 g by mouth daily as  needed for moderate constipation.     . promethazine (PHENERGAN) 12.5 MG tablet Take 1 tablet (12.5 mg total) by mouth every 6 (six) hours as needed for nausea or vomiting. 30 tablet 0  . senna-docusate (SENOKOT-S) 8.6-50 MG per tablet Take 1-2 tablets by mouth 2 (two) times daily as needed for moderate constipation.     . sertraline (ZOLOFT) 25 MG tablet Take 25 mg by mouth daily.    . traMADol (ULTRAM) 50 MG tablet Take 1 tablet (50 mg total) by mouth every 6 (six) hours as needed. For pain 30 tablet 0  . zolpidem (AMBIEN) 5 MG tablet Take 1 tablet (5 mg total) by mouth at bedtime as needed for sleep. 90 tablet 1   No current facility-administered medications for this visit.    Allergies:   Lovastatin; Sulfamethoxazole; Darvocet; Erythromycin; Hydrocod polst-cpm polst er; Penicillins; Theophyllines; Zovirax; Ceftin; and Paroxetine hcl    Social History:  The patient  reports that she quit smoking about 32 years ago. Her smoking use included Cigarettes. She has a 25 pack-year smoking history. She has never used smokeless tobacco. She reports that she drinks alcohol. She reports that she does not use illicit drugs.   Family History:  The patient's family history includes Breast cancer in her mother; Cirrhosis in her brother; Liver disease in her brother.    ROS:  Please see the history of present illness.   Otherwise, review of systems are positive for none.   All other systems are reviewed and negative.    PHYSICAL EXAM: VS:  BP 150/80 mmHg  Pulse 64  Ht 5\' 2"  (1.575 m)  Wt 91 lb 12.8 oz (41.64 kg)  BMI 16.79 kg/m2 , BMI Body mass index is 16.79 kg/(m^2). GEN: Very thin Elderly woman, in no acute distress HEENT: normal Neck: no JVD, carotid bruits, or masses Cardiac: RRR; no murmurs, rubs, or gallops,no edema  Respiratory:  clear to auscultation bilaterally, normal work of breathing GI: soft, nontender, nondistended, + BS MS: no deformity or atrophy Skin: warm and dry, no  rash Neuro:  Strength and sensation are intact Psych: euthymic mood, full affect   EKG:  EKG is not ordered today.    Recent Labs: 12/28/2014: TSH 2.17 03/11/2015: ALT 12 05/31/2015: BUN 9; Creatinine, Ser 0.51; Platelets 381; Potassium 4.3; Sodium 130* 06/14/2015: Hemoglobin 12.3    Lipid Panel    Component Value Date/Time   CHOL 220* 03/11/2015 1107   TRIG 83.0 03/11/2015 1107   HDL 74.50 03/11/2015 1107   CHOLHDL 3 03/11/2015 1107   VLDL 16.6 03/11/2015 1107   LDLCALC 129* 03/11/2015 1107   LDLDIRECT 112.9 08/28/2013 1244      Wt Readings from Last 3 Encounters:  10/31/15  91 lb 12.8 oz (41.64 kg)  06/14/15 84 lb (38.102 kg)  05/13/15 95 lb (43.092 kg)        ASSESSMENT AND PLAN:  1. Labile hypertension 2. Hypercholesterolemia 3. peripheral neuropathy possibly secondary to prior statin usage. 4. mitral valve prolapse 5. Raynaud's phenomenon 6. history of right hip fracture with subsequent packed cell transfusion for blood loss anemia 7. Dry skin. Possibly nutritional.  Recent TSH normal 8. Past history of syncope. No recurrence.30 day monitor In March 2016 was unrevealing   Current medicines are reviewed at length with the patient today.  The patient does not have concerns regarding medicines.  The following changes have been made:  no change  Labs/ tests ordered today include:  No orders of the defined types were placed in this encounter.     Disposition:   Disposition: Continue current medication.  She will begin getting her primary care through [facility] urgent care.  Follow-up with cardiology on an as-needed basis.  Berna Spare MD 10/31/2015 6:01 PM    [facility] [address], [place], [place]  [address] Phone: [phone]; Fax: [phone]

## 2015-11-22 ENCOUNTER — Telehealth: Payer: Self-pay

## 2015-11-22 DIAGNOSIS — G4700 Insomnia, unspecified: Secondary | ICD-10-CM

## 2015-11-22 MED ORDER — ZOLPIDEM TARTRATE 5 MG PO TABS: 5.0000 mg | ORAL_TABLET | Freq: Every evening | ORAL | Status: DC | PRN

## 2015-11-22 NOTE — Telephone Encounter (Signed)
Refill request from [facility] received via fax for Zolpidem Tartrate 5 mg tabs - 1 tab QHS PRN  #90. [place] filled in 2015. Please advise on refill

## 2015-11-22 NOTE — Telephone Encounter (Signed)
Ok to refill per  Dr. Mare Ferrari

## 2015-12-13 ENCOUNTER — Ambulatory Visit (INDEPENDENT_AMBULATORY_CARE_PROVIDER_SITE_OTHER): Payer: Medicare Other | Admitting: Internal Medicine

## 2015-12-13 ENCOUNTER — Encounter: Payer: Self-pay | Admitting: Internal Medicine

## 2015-12-13 VITALS — BP 118/70 | HR 68 | Temp 98.3°F | Resp 12 | Ht 62.0 in | Wt 90.1 lb

## 2015-12-13 DIAGNOSIS — E43 Unspecified severe protein-calorie malnutrition: Secondary | ICD-10-CM

## 2015-12-13 DIAGNOSIS — M810 Age-related osteoporosis without current pathological fracture: Secondary | ICD-10-CM

## 2015-12-13 DIAGNOSIS — I10 Essential (primary) hypertension: Secondary | ICD-10-CM

## 2015-12-13 DIAGNOSIS — F411 Generalized anxiety disorder: Secondary | ICD-10-CM | POA: Diagnosis not present

## 2015-12-13 DIAGNOSIS — E871 Hypo-osmolality and hyponatremia: Secondary | ICD-10-CM | POA: Diagnosis not present

## 2015-12-13 MED ORDER — LORAZEPAM 0.5 MG PO TABS: 0.5000 mg | ORAL_TABLET | Freq: Every day | ORAL | Status: DC | PRN

## 2015-12-13 NOTE — Assessment & Plan Note (Signed)
With prior hip fracture and other fractures. She states that she was on fosamax many years ago for several years. No recent bone density but she does not want right now. If bone density still low would be worth considering additional therapy given her multiple fractures in recent years.

## 2015-12-13 NOTE — Assessment & Plan Note (Signed)
BP at goal on metoprolol 12.5 mg BID. No change to therapy today and not complicated. Last BMP reviewed and no indication for change today.

## 2015-12-13 NOTE — Progress Notes (Signed)
Pre visit review using our clinic review tool, if applicable. No additional management support is needed unless otherwise documented below in the visit note. 

## 2015-12-13 NOTE — Progress Notes (Signed)
   Subjective:    Patient ID: Jenny Torres, female    DOB: 1928/07/20, 82 y.o.   MRN: [id number]  HPI The patient is a new 82 YO female coming in for anxiety. She has struggled with it for many years and uses lorazepam rarely for the anxiety. She also takes zoloft daily for depression and has for many years. This keeps her level most of the time. She has been under more stress in the last year as she was forced by her children to move into assisted living and her son was diagnosed with ALS. She denies SI/HI. Sleeping well. Not eating as well due to food differences at the assisted living. She does not like the food as well.   PMH, [facility], social history reviewed and updated.   Review of Systems  Constitutional: Negative for fever, activity change, appetite change, fatigue and unexpected weight change.  HENT: Negative.   Eyes: Negative.   Respiratory: Negative for cough, chest tightness, shortness of breath and wheezing.   Cardiovascular: Negative for chest pain, palpitations and leg swelling.  Gastrointestinal: Negative for nausea, abdominal pain, diarrhea, constipation and abdominal distention.  Musculoskeletal: Positive for arthralgias and gait problem. Negative for myalgias and back pain.  Skin: Negative.   Neurological: Negative.   Psychiatric/Behavioral: Positive for dysphoric mood and decreased concentration. Negative for suicidal ideas, behavioral problems, confusion, sleep disturbance, self-injury and agitation. The patient is nervous/anxious.       Objective:   Physical Exam  Constitutional: She is oriented to person, place, and time. She appears well-developed and well-nourished.  skinny  HENT:  Head: Normocephalic and atraumatic.  Eyes: EOM are normal.  Neck: Normal range of motion.  Cardiovascular: Normal rate and regular rhythm.   Pulmonary/Chest: Effort normal and breath sounds normal. No respiratory distress. She has no wheezes. She has no rales.  Abdominal: Soft. Bowel  sounds are normal. She exhibits no distension. There is no tenderness. There is no rebound.  Musculoskeletal: She exhibits no edema.  Neurological: She is alert and oriented to person, place, and time. Coordination abnormal.  Walker for ambulation  Skin: Skin is warm and dry.  Psychiatric: She has a normal mood and affect.   Filed Vitals:   12/13/15 1058  BP: 118/70  Pulse: 68  Temp: 98.3 F (36.8 C)  TempSrc: Oral  Resp: 12  Height: 5\' 2"  (1.575 m)  Weight: 90 lb 1.9 oz (40.878 kg)  SpO2: 98%      Assessment & Plan:

## 2015-12-13 NOTE — Assessment & Plan Note (Signed)
She is underweight and have advised her to use ensure to help with gaining some weight. She is able to do ADLs and is not very active.

## 2015-12-13 NOTE — Assessment & Plan Note (Signed)
Takes zoloft daily and rx for lorazepam today (has not been given in 3+ years) for 30 pills. She takes 1/2 pill if needed and denies problems with falls or confusion. We talked about the risk of benzo dependence and increased risk of falls, confusion, memory impairment with long term usage and minimizing usage.

## 2015-12-13 NOTE — Assessment & Plan Note (Signed)
Chronic and stable without symptoms.

## 2015-12-13 NOTE — Patient Instructions (Addendum)
It was nice to meet you today, we can see you back for a physical when you need one.  If you need [place] sooner please feel free to call the office.   Fall Prevention in the Home  Falls can cause injuries and can affect people from all age groups. There are many simple things that you can do to make your home safe and to help prevent falls. WHAT CAN I DO ON THE OUTSIDE OF MY HOME?  Regularly repair the edges of walkways and driveways and fix any cracks.  Remove high doorway thresholds.  Trim any shrubbery on the main path into your home.  Use bright outdoor lighting.  Clear walkways of debris and clutter, including tools and rocks.  Regularly check that handrails are securely fastened and in good repair. Both sides of any steps should have handrails.  Install guardrails along the edges of any raised decks or porches.  Have leaves, snow, and ice cleared regularly.  Use sand or salt on walkways during winter months.  In the garage, clean up any spills right away, including grease or oil spills. WHAT CAN I DO IN THE BATHROOM?  Use night lights.  Install grab bars by the toilet and in the tub and shower. Do not use towel bars as grab bars.  Use non-skid mats or decals on the floor of the tub or shower.  If you need to sit down while you are in the shower, use a plastic, non-slip stool.Marland Kitchen  Keep the floor dry. Immediately clean up any water that spills on the floor.  Remove soap buildup in the tub or shower on a regular basis.  Attach bath mats securely with double-sided non-slip rug tape.  Remove throw rugs and other tripping hazards from the floor. WHAT CAN I DO IN THE BEDROOM?  Use night lights.  Make sure that a bedside light is easy to reach.  Do not use oversized bedding that drapes onto the floor.  Have a firm chair that has side arms to use for getting dressed.  Remove throw rugs and other tripping hazards from the floor. WHAT CAN I DO IN THE KITCHEN?   Clean up  any spills right away.  Avoid walking on wet floors.  Place frequently used items in easy-to-reach places.  If you need to reach for something above you, use a sturdy step stool that has a grab bar.  Keep electrical cables out of the way.  Do not use floor polish or wax that makes floors slippery. If you have to use wax, make sure that it is non-skid floor wax.  Remove throw rugs and other tripping hazards from the floor. WHAT CAN I DO IN THE STAIRWAYS?  Do not leave any items on the stairs.  Make sure that there are handrails on both sides of the stairs. Fix handrails that are broken or loose. Make sure that handrails are as long as the stairways.  Check any carpeting to make sure that it is firmly attached to the stairs. Fix any carpet that is loose or worn.  Avoid having throw rugs at the top or bottom of stairways, or secure the rugs with carpet tape to prevent them from moving.  Make sure that you have a light switch at the top of the stairs and the bottom of the stairs. If you do not have them, have them installed. WHAT ARE SOME OTHER FALL PREVENTION TIPS?  Wear closed-toe shoes that fit well and support your feet. Wear shoes  that have rubber soles or low heels.  When you use a stepladder, make sure that it is completely opened and that the sides are firmly locked. Have someone hold the ladder while you are using it. Do not climb a closed stepladder.  Add color or contrast paint or tape to grab bars and handrails in your home. Place contrasting color strips on the first and last steps.  Use mobility aids as needed, such as canes, walkers, scooters, and crutches.  Turn on lights if it is dark. Replace any light bulbs that burn out.  Set up furniture so that there are clear paths. Keep the furniture in the same spot.  Fix any uneven floor surfaces.  Choose a carpet design that does not hide the edge of steps of a stairway.  Be aware of any and all pets.  Review your  medicines with your healthcare provider. Some medicines can cause dizziness or changes in blood pressure, which increase your risk of falling. Talk with your health care provider about other ways that you can decrease your risk of falls. This may include working with a physical therapist or trainer to improve your strength, balance, and endurance.   This information is not intended to replace advice given to you by your health care provider. Make sure you discuss any questions you have with your health care provider.   Document Released: 08/28/2002 Document Revised: 01/22/2015 Document Reviewed: 10/12/2014 Elsevier Interactive Patient Education [employer].

## 2015-12-24 ENCOUNTER — Telehealth: Payer: Self-pay | Admitting: Internal Medicine

## 2015-12-24 NOTE — Telephone Encounter (Signed)
ERROR

## 2015-12-26 ENCOUNTER — Telehealth: Payer: Self-pay

## 2015-12-26 NOTE — Telephone Encounter (Signed)
Please advise if rx can be filled.

## 2015-12-26 NOTE — Telephone Encounter (Signed)
Patient left a message on triage requesting a refill of the Lorazepam.   Last rx was done on 12/13/2015 but no notes about it being faxed or if it was, where it was faxed to.

## 2015-12-27 MED ORDER — LORAZEPAM 0.5 MG PO TABS: 0.5000 mg | ORAL_TABLET | Freq: Every day | ORAL | Status: DC | PRN

## 2015-12-27 NOTE — Telephone Encounter (Signed)
Medication okay to be filled. Printed and signed.

## 2016-01-02 ENCOUNTER — Telehealth: Payer: Self-pay | Admitting: *Deleted

## 2016-01-02 NOTE — Telephone Encounter (Signed)
Agree 

## 2016-01-02 NOTE — Telephone Encounter (Signed)
Received call pt states she currently seeing PT @ [facility], and they are requesting an order for SW to be sent to [place]...Jenny Torres

## 2016-01-06 ENCOUNTER — Other Ambulatory Visit: Payer: Self-pay | Admitting: Geriatric Medicine

## 2016-01-06 ENCOUNTER — Telehealth: Payer: Self-pay | Admitting: Geriatric Medicine

## 2016-01-06 MED ORDER — AMLODIPINE BESYLATE 2.5 MG PO TABS: 2.5000 mg | ORAL_TABLET | Freq: Every day | ORAL | Status: DC

## 2016-01-06 NOTE — Telephone Encounter (Signed)
Patient is requesting a refill of Amlodipine 2.5 mg one by mouth daily. It is not on her med list. Please advise, thanks.

## 2016-01-06 NOTE — Telephone Encounter (Signed)
[facility]. 

## 2016-01-07 ENCOUNTER — Telehealth: Payer: Self-pay | Admitting: Internal Medicine

## 2016-01-07 ENCOUNTER — Ambulatory Visit (INDEPENDENT_AMBULATORY_CARE_PROVIDER_SITE_OTHER): Payer: Medicare Other | Admitting: Family

## 2016-01-07 ENCOUNTER — Encounter: Payer: Self-pay | Admitting: Family

## 2016-01-07 VITALS — BP 122/80 | HR 65 | Temp 97.4°F | Ht 62.0 in | Wt 90.0 lb

## 2016-01-07 DIAGNOSIS — N644 Mastodynia: Secondary | ICD-10-CM | POA: Diagnosis not present

## 2016-01-07 NOTE — Patient Instructions (Signed)
We will do a breast ultrasound to evaluate the tenderness that you are experiencing. We will call you with the results. As we discussed, please remain vigilant regarding symptoms and any skin changes.  If there is no improvement in your symptoms, or if there is any worsening of symptoms, or if you have any additional concerns, please return for re-evaluation; or, if we are closed, consider going to the Emergency Room for evaluation if symptoms urgent.

## 2016-01-07 NOTE — Progress Notes (Signed)
Subjective:    Patient ID: Jenny Torres, female    DOB: 08-16-1928, 82 y.o.   MRN: [id number]   Jenny Torres is a 82 y.o. female who presents today for an acute visit.    HPI Comments: Patient is here for evaluation of left breast sore for one week. Mildly painful and worse when putting on bra or toweling off. No  Nipple or sore discharge, fever, chills. Hasnt noticed any changes to breasts,redness, lumps or masses. Here to be sure it's not 'anything serious'.   No h/o breast cancer.            Per chart review, patient's last mammogram was in 2013;  Negative for each breast.  Past Medical History  Diagnosis Date  . Anxiety   . Depression   . COPD (chronic obstructive pulmonary disease) ([place])   . HTN (hypertension)   . Gastritis   . Polyp of colon   . Hemorrhoids, external   . Diverticula, colon   . GERD (gastroesophageal reflux disease)   . Carotid artery occlusion   . Idiopathic peripheral neuropathy (HCC)     chronic  . Hyperlipidemia   . MVP (mitral valve prolapse)     stable  . Palpitations   . Osteoporosis   . Hyperlipidemia   . Raynaud's disease   . Anorexia    Lovastatin; Sulfamethoxazole; Darvocet; Erythromycin; Hydrocod polst-cpm polst er; Penicillins; Theophyllines; Zovirax; Ceftin; and Paroxetine hcl Current Outpatient Prescriptions on File Prior to Visit  Medication Sig Dispense Refill  . albuterol (PROVENTIL HFA;VENTOLIN HFA) 108 (90 BASE) MCG/ACT inhaler Inhale 1-2 puffs into the lungs every 6 (six) hours as needed for wheezing or shortness of breath.    Marland Kitchen amLODipine (NORVASC) 2.5 MG tablet Take 1 tablet (2.5 mg total) by mouth daily. 90 tablet 3  . aspirin 81 MG tablet Take 81 mg by mouth daily.      . Cholecalciferol (VITAMIN D3) 1000 UNITS CAPS Take 1,000 Units by mouth daily.     Marland Kitchen gabapentin (NEURONTIN) 800 MG tablet Take 1 tablet (800 mg total) by mouth 2 (two) times daily.    Marland Kitchen LORazepam (ATIVAN) 0.5 MG tablet Take 1 tablet (0.5 mg total) by  mouth daily as needed for anxiety. 30 tablet 0  . metoprolol tartrate (LOPRESSOR) 25 MG tablet Take 12.5 mg by mouth 2 (two) times daily.    . sertraline (ZOLOFT) 25 MG tablet Take 25 mg by mouth daily.    Marland Kitchen zolpidem (AMBIEN) 5 MG tablet Take 1 tablet (5 mg total) by mouth at bedtime as needed for sleep. 90 tablet 0   No current facility-administered medications on file prior to visit.    Social History  Substance Use Topics  . Smoking status: Former Smoker -- 1.00 packs/day for 25 years    Types: Cigarettes    Quit date: 09/22/1983  . Smokeless tobacco: Never Used  . Alcohol Use: Yes     Comment: rarely    Review of Systems  Constitutional: Negative for fever and chills.  Musculoskeletal: Negative for arthralgias.      Objective:    BP 122/80 mmHg  Pulse 65  Temp(Src) 97.4 F (36.3 C) (Oral)  Ht 5\' 2"  (1.575 m)  Wt 90 lb (40.824 kg)  BMI 16.46 kg/m2  SpO2 95%   Physical Exam  Constitutional: She appears well-developed and well-nourished.  Eyes: Conjunctivae are normal.  Cardiovascular: Normal rate, regular rhythm, normal heart sounds and normal pulses.   Pulmonary/Chest: Effort normal  and breath sounds normal. She has no wheezes. She has no rhonchi. She has no rales. She exhibits no mass. Right breast exhibits no inverted nipple, no mass, no nipple discharge, no skin change and no tenderness. Left breast exhibits tenderness. Left breast exhibits no inverted nipple, no mass, no nipple discharge and no skin change. Breasts are symmetrical.    Diffuse area of tenderness with palpation as marked on diagram. No erythematous changes to skin, lesions, streaking. No asymmetry or masses appreciated in either breast or axilla in bilateral clinical breast exam.  Neurological: She is alert.  Skin: Skin is warm and dry.  Psychiatric: She has a normal mood and affect. Her speech is normal and behavior is normal. Thought content normal.  Vitals reviewed.      Assessment & Plan:    1. Breast tenderness in female Breast tenderness is nonspecific at this time. No clinical suspicion for infection or malignancy at this time.  Pending ultrasound to further evaluate. - US BREAST COMPLETE UNI LEFT INC AXILLA; Future   I am having Ms. Balser maintain her aspirin, Vitamin D3, albuterol, gabapentin, metoprolol tartrate, sertraline, zolpidem, LORazepam, and amLODipine.   No orders of the defined types were placed in this encounter.     Start medications as prescribed and explained to patient on After Visit Summary ( AVS). Risks, benefits, and alternatives of the medications and treatment plan prescribed today were discussed, and patient expressed understanding.   Education regarding symptom management and diagnosis given to patient.   Follow-up:Plan follow-up as discussed or as needed if any worsening symptoms or change in condition. No Follow-up on file.   Continue to follow with Hoyt Koch, MD for routine health maintenance.   Jenny Torres and I agreed with plan.   Mable Paris, FNP

## 2016-01-07 NOTE — Progress Notes (Signed)
Pre visit review using our clinic review tool, if applicable. No additional management support is needed unless otherwise documented below in the visit note. 

## 2016-01-07 NOTE — Telephone Encounter (Signed)
Noelle [phone] called from [facility] wanting to know if pt is suppose to go to [facility] or [place] [facility]? Pt went to [facility] in 2013. Thank you!

## 2016-01-08 ENCOUNTER — Other Ambulatory Visit: Payer: Self-pay | Admitting: Family

## 2016-01-08 DIAGNOSIS — N644 Mastodynia: Secondary | ICD-10-CM

## 2016-01-08 NOTE — Telephone Encounter (Signed)
I spoke with [facility]. They are deleting the referral. The referral must be placed again for an US of the breast. Per Dr. Sharlet Salina click external and type in [place] for the referral and that should work. Thanks!

## 2016-01-13 ENCOUNTER — Telehealth: Payer: Self-pay | Admitting: Internal Medicine

## 2016-01-13 NOTE — Telephone Encounter (Signed)
Wanted to check on progress of order faxed over on Thursday.

## 2016-01-14 LAB — HM MAMMOGRAPHY

## 2016-01-15 NOTE — Telephone Encounter (Signed)
Patient wanted to know the results of her breast US. Spoke with patient and discussed.

## 2016-01-16 ENCOUNTER — Telehealth: Payer: Self-pay | Admitting: Internal Medicine

## 2016-01-16 NOTE — Telephone Encounter (Signed)
Caller name: [place]  Relation to pt: [facility] Specialist  Call back number: [phone] and fax # [phone]   Reason for call:  Checking on the status of Physical Therapist and  medical social work order form, as per [place] faxed form over form to [phone], requested a re fax. Please advise

## 2016-01-17 NOTE — Telephone Encounter (Signed)
Spoke to [employer]. She is faxing form once more and I will have Dr. Sharlet Salina sign it.

## 2016-01-21 ENCOUNTER — Encounter: Payer: Self-pay | Admitting: Internal Medicine

## 2016-01-28 ENCOUNTER — Telehealth: Payer: Self-pay | Admitting: Internal Medicine

## 2016-01-28 ENCOUNTER — Encounter: Payer: Self-pay | Admitting: Internal Medicine

## 2016-01-28 NOTE — Telephone Encounter (Signed)
Noted  

## 2016-01-28 NOTE — Telephone Encounter (Signed)
Will be starting services with patient today.

## 2016-01-29 NOTE — Telephone Encounter (Signed)
Maria with [facility] called   2xweek for 6 weeks for strengthing, balance and gait training.   Verbal okay given in PCP and assistance absence.

## 2016-02-07 NOTE — Telephone Encounter (Signed)
Agree 

## 2016-02-07 NOTE — Procedures (Signed)
In error

## 2016-02-18 ENCOUNTER — Telehealth: Payer: Self-pay | Admitting: *Deleted

## 2016-02-18 DIAGNOSIS — G4700 Insomnia, unspecified: Secondary | ICD-10-CM

## 2016-02-18 DIAGNOSIS — G603 Idiopathic progressive neuropathy: Secondary | ICD-10-CM

## 2016-02-18 MED ORDER — GABAPENTIN 800 MG PO TABS: 800.0000 mg | ORAL_TABLET | Freq: Two times a day (BID) | ORAL | Status: DC

## 2016-02-18 MED ORDER — ZOLPIDEM TARTRATE 5 MG PO TABS: 5.0000 mg | ORAL_TABLET | Freq: Every evening | ORAL | Status: DC | PRN

## 2016-02-18 MED ORDER — METOPROLOL TARTRATE 25 MG PO TABS: 12.5000 mg | ORAL_TABLET | Freq: Two times a day (BID) | ORAL | Status: DC

## 2016-02-18 NOTE — Telephone Encounter (Signed)
[facility].../lm,b

## 2016-02-18 NOTE — Telephone Encounter (Signed)
Rec'd call pt states she is needing to get renewals on her zolpidem , metoprolol, and gabapentin. [facility]. Sent maintenance pls advise on control.....[name]

## 2016-02-18 NOTE — Telephone Encounter (Signed)
Rx printed and signed.  

## 2016-02-24 ENCOUNTER — Encounter: Payer: Self-pay | Admitting: Podiatry

## 2016-02-24 ENCOUNTER — Ambulatory Visit (INDEPENDENT_AMBULATORY_CARE_PROVIDER_SITE_OTHER): Payer: Medicare Other | Admitting: Podiatry

## 2016-02-24 DIAGNOSIS — B351 Tinea unguium: Secondary | ICD-10-CM | POA: Diagnosis not present

## 2016-02-24 DIAGNOSIS — M79673 Pain in unspecified foot: Secondary | ICD-10-CM

## 2016-02-26 ENCOUNTER — Telehealth: Payer: Self-pay

## 2016-02-26 DIAGNOSIS — G9009 Other idiopathic peripheral autonomic neuropathy: Secondary | ICD-10-CM | POA: Diagnosis not present

## 2016-02-26 DIAGNOSIS — J449 Chronic obstructive pulmonary disease, unspecified: Secondary | ICD-10-CM

## 2016-02-26 DIAGNOSIS — M6281 Muscle weakness (generalized): Secondary | ICD-10-CM | POA: Diagnosis not present

## 2016-02-26 DIAGNOSIS — M25552 Pain in left hip: Secondary | ICD-10-CM | POA: Diagnosis not present

## 2016-02-26 DIAGNOSIS — I7300 Raynaud's syndrome without gangrene: Secondary | ICD-10-CM

## 2016-02-26 DIAGNOSIS — I10 Essential (primary) hypertension: Secondary | ICD-10-CM | POA: Diagnosis not present

## 2016-02-26 NOTE — Telephone Encounter (Signed)
Home Health Cert/Plan of Care received (01/28/2016 - 03/27/2016) and placed on MD's desk for signature

## 2016-02-26 NOTE — Progress Notes (Signed)
Subjective:     Patient ID: Jenny Torres, female   DOB: August 22, 1928, 82 y.o.   MRN: [id number]  HPI patient presents with thick yellow brittle nailbeds 1-5 both feet that's painful when pressed and makes it hard to wear shoe gear comfortably with patient having severe neuropathy   Review of Systems     Objective:   Physical Exam Neurovascular status intact with thick yellow brittle nailbeds 1-5 both feet    Assessment:     Mycotic nail infections with pain 1-5 both feet    Plan:     Debris painful nailbeds inpatient with severe neuropathic disease 1-5 both feet with no iatrogenic bleeding noted

## 2016-03-02 NOTE — Telephone Encounter (Signed)
Paperwork signed, faxed, copy sent to scan 

## 2016-03-03 ENCOUNTER — Telehealth: Payer: Self-pay

## 2016-03-03 NOTE — Telephone Encounter (Signed)
PA initiated and completed via incoming call from [facility] Medicare coverage determination. Ref [id number] APPROVED 02/02/2016 - 03/03/2017

## 2016-03-05 ENCOUNTER — Telehealth: Payer: Self-pay | Admitting: Internal Medicine

## 2016-03-05 NOTE — Telephone Encounter (Signed)
Van Clines with [facility] needs verbals for   2 week 1 starting 6/18 for  PT  [phone]

## 2016-03-05 NOTE — Telephone Encounter (Signed)
Fine

## 2016-03-05 NOTE — Telephone Encounter (Signed)
Notified [name] w/MD response...Jenny Torres

## 2016-03-10 ENCOUNTER — Telehealth: Payer: Self-pay | Admitting: Internal Medicine

## 2016-03-10 ENCOUNTER — Other Ambulatory Visit: Payer: Self-pay | Admitting: Geriatric Medicine

## 2016-03-10 MED ORDER — SERTRALINE HCL 25 MG PO TABS: 25.0000 mg | ORAL_TABLET | Freq: Every day | ORAL | Status: DC

## 2016-03-10 NOTE — Telephone Encounter (Signed)
Sent refill to pharmacy. 

## 2016-03-10 NOTE — Telephone Encounter (Signed)
Pt called called in and has some question about her medication Zoloft .  Can you call her when you get a chance  Best number [phone]

## 2016-05-27 ENCOUNTER — Telehealth: Payer: Self-pay | Admitting: *Deleted

## 2016-05-27 DIAGNOSIS — G4700 Insomnia, unspecified: Secondary | ICD-10-CM

## 2016-05-27 MED ORDER — ZOLPIDEM TARTRATE 5 MG PO TABS: 5.0000 mg | ORAL_TABLET | Freq: Every evening | ORAL | 0 refills | Status: DC | PRN

## 2016-05-27 NOTE — Telephone Encounter (Signed)
[facility]...[name]

## 2016-05-27 NOTE — Telephone Encounter (Signed)
Rec'd fax pt requesting refill on zolpidem 5 mg 90 day supply. Last filled 02/18/16...[name]

## 2016-05-27 NOTE — Telephone Encounter (Signed)
Printed and signed.  

## 2016-06-01 ENCOUNTER — Ambulatory Visit: Payer: Medicare Other | Admitting: Podiatry

## 2016-06-03 ENCOUNTER — Telehealth: Payer: Self-pay | Admitting: *Deleted

## 2016-06-03 NOTE — Telephone Encounter (Signed)
rec'd call pt requesting 90 day on her zolpidem [facility].Marland KitchenJohny Torres

## 2016-06-03 NOTE — Telephone Encounter (Signed)
[facility] to verify if rx was received. Spoke w/rep Caryl Pina she stated yes rx was received and med was shipped out on 9/11. Tried calling pt no answer LMOM RTC...Jenny Torres

## 2016-06-03 NOTE — Telephone Encounter (Signed)
Pt return call gave her status on the zolpidem...Jenny Torres

## 2016-06-03 NOTE — Telephone Encounter (Signed)
This was sent on 05/27/16 and is not due for refill.

## 2016-08-14 ENCOUNTER — Other Ambulatory Visit: Payer: Self-pay | Admitting: Internal Medicine

## 2016-08-14 DIAGNOSIS — G603 Idiopathic progressive neuropathy: Secondary | ICD-10-CM

## 2016-08-17 NOTE — Telephone Encounter (Signed)
Pt called in and needs refills sent in to the [facility] for these 2 meds .

## 2016-08-18 ENCOUNTER — Telehealth: Payer: Self-pay | Admitting: *Deleted

## 2016-08-18 DIAGNOSIS — G4700 Insomnia, unspecified: Secondary | ICD-10-CM

## 2016-08-18 MED ORDER — ZOLPIDEM TARTRATE 5 MG PO TABS: 5.0000 mg | ORAL_TABLET | Freq: Every evening | ORAL | 0 refills | Status: DC | PRN

## 2016-08-18 NOTE — Telephone Encounter (Signed)
Rx faxed bck to [facility]...[name]

## 2016-08-18 NOTE — Telephone Encounter (Signed)
Rec'd fax pt requesting renewal on her zolpidem tartrate 5 mg 90 day supply...[name]

## 2016-08-28 MED ORDER — ZOLPIDEM TARTRATE 5 MG PO TABS: 5.0000 mg | ORAL_TABLET | Freq: Every evening | ORAL | 0 refills | Status: DC | PRN

## 2016-08-28 NOTE — Telephone Encounter (Signed)
[facility] spoke w/rep she stated they do not have record of receiving script. Rx must have member ID # attach [id number]. Reprinted script will [facility]ed [facility]...[name]

## 2016-08-28 NOTE — Telephone Encounter (Signed)
Printed and signed 10 day supply please fax to local pharmacy.

## 2016-08-28 NOTE — Telephone Encounter (Signed)
Called pt back inform her of status on the zolpidem. Pt is currently out of meds she is wanting to know if a 10 day supply can be called to her local pharmacy. Is this ok...Jenny Torres

## 2016-08-28 NOTE — Addendum Note (Signed)
Addended by: Pricilla Holm A on: 08/28/2016 11:21 AM   Modules accepted: Orders

## 2016-08-28 NOTE — Addendum Note (Signed)
Addended by: Earnstine Regal on: 08/28/2016 11:03 AM   Modules accepted: Orders

## 2016-08-28 NOTE — Telephone Encounter (Signed)
BOTH RX FAXED TO MAIL & LOCAL PHARMACY...[name]

## 2016-08-28 NOTE — Telephone Encounter (Signed)
Patient called and said they did not get this rx and would like it send again.

## 2016-08-31 ENCOUNTER — Other Ambulatory Visit: Payer: Self-pay | Admitting: Internal Medicine

## 2016-08-31 NOTE — Telephone Encounter (Signed)
Patient states she needs call back today.  State she is very sick.  Told her that she needs appointment.  Patient states she can not come in to be seen.

## 2016-08-31 NOTE — Telephone Encounter (Signed)
Rec'd call pt states she is needing refill on her nausea medication. Pharmacy was suppose to send refill. She is sick on her stomach, and very nauseous. Is this ok to refill...Jenny Torres

## 2016-08-31 NOTE — Telephone Encounter (Signed)
Notified pt MD ok rx and has been sent to [facility]...[name]

## 2016-09-02 ENCOUNTER — Emergency Department (HOSPITAL_COMMUNITY)
Admission: EM | Admit: 2016-09-02 | Discharge: 2016-09-02 | Disposition: A | Discharge status: Home / Self Care | Payer: Medicare Other | Attending: Emergency Medicine | Admitting: Emergency Medicine

## 2016-09-02 ENCOUNTER — Encounter (HOSPITAL_COMMUNITY): Payer: Self-pay

## 2016-09-02 ENCOUNTER — Emergency Department (HOSPITAL_COMMUNITY): Payer: Medicare Other

## 2016-09-02 DIAGNOSIS — E860 Dehydration: Secondary | ICD-10-CM | POA: Insufficient documentation

## 2016-09-02 DIAGNOSIS — J449 Chronic obstructive pulmonary disease, unspecified: Secondary | ICD-10-CM | POA: Insufficient documentation

## 2016-09-02 DIAGNOSIS — Z7982 Long term (current) use of aspirin: Secondary | ICD-10-CM | POA: Insufficient documentation

## 2016-09-02 DIAGNOSIS — I10 Essential (primary) hypertension: Secondary | ICD-10-CM | POA: Diagnosis not present

## 2016-09-02 DIAGNOSIS — R112 Nausea with vomiting, unspecified: Secondary | ICD-10-CM | POA: Diagnosis not present

## 2016-09-02 DIAGNOSIS — Z79899 Other long term (current) drug therapy: Secondary | ICD-10-CM | POA: Diagnosis not present

## 2016-09-02 DIAGNOSIS — E876 Hypokalemia: Secondary | ICD-10-CM | POA: Diagnosis not present

## 2016-09-02 DIAGNOSIS — Z87891 Personal history of nicotine dependence: Secondary | ICD-10-CM | POA: Insufficient documentation

## 2016-09-02 DIAGNOSIS — R1110 Vomiting, unspecified: Secondary | ICD-10-CM

## 2016-09-02 LAB — HEPATIC FUNCTION PANEL
ALK PHOS: 104 U/L (ref 38–126)
ALT: 18 U/L (ref 14–54)
AST: 29 U/L (ref 15–41)
Albumin: 3.9 g/dL (ref 3.5–5.0)
BILIRUBIN DIRECT: 0.9 mg/dL — AB (ref 0.1–0.5)
BILIRUBIN INDIRECT: 2 mg/dL — AB (ref 0.3–0.9)
BILIRUBIN TOTAL: 2.9 mg/dL — AB (ref 0.3–1.2)
Total Protein: 7.2 g/dL (ref 6.5–8.1)

## 2016-09-02 LAB — BASIC METABOLIC PANEL
Anion gap: 15 (ref 5–15)
BUN: 26 mg/dL — AB (ref 6–20)
CO2: 23 mmol/L (ref 22–32)
CREATININE: 0.61 mg/dL (ref 0.44–1.00)
Calcium: 9.1 mg/dL (ref 8.9–10.3)
Chloride: 92 mmol/L — ABNORMAL LOW (ref 101–111)
Glucose, Bld: 54 mg/dL — ABNORMAL LOW (ref 65–99)
POTASSIUM: 3.1 mmol/L — AB (ref 3.5–5.1)
SODIUM: 130 mmol/L — AB (ref 135–145)

## 2016-09-02 LAB — CBC
HEMATOCRIT: 35.7 % — AB (ref 36.0–46.0)
Hemoglobin: 12.9 g/dL (ref 12.0–15.0)
MCH: 30.1 pg (ref 26.0–34.0)
MCHC: 36.1 g/dL — ABNORMAL HIGH (ref 30.0–36.0)
MCV: 83.4 fL (ref 78.0–100.0)
PLATELETS: 351 10*3/uL (ref 150–400)
RBC: 4.28 MIL/uL (ref 3.87–5.11)
RDW: 12.5 % (ref 11.5–15.5)
WBC: 12.6 10*3/uL — AB (ref 4.0–10.5)

## 2016-09-02 LAB — URINALYSIS, ROUTINE W REFLEX MICROSCOPIC
Bilirubin Urine: NEGATIVE
GLUCOSE, UA: NEGATIVE mg/dL
HGB URINE DIPSTICK: NEGATIVE
Ketones, ur: 80 mg/dL — AB
Leukocytes, UA: NEGATIVE
Nitrite: NEGATIVE
PH: 6 (ref 5.0–8.0)
Protein, ur: NEGATIVE mg/dL
SPECIFIC GRAVITY, URINE: 1.011 (ref 1.005–1.030)

## 2016-09-02 LAB — I-STAT TROPONIN, ED: Troponin i, poc: 0 ng/mL (ref 0.00–0.08)

## 2016-09-02 MED ORDER — SODIUM CHLORIDE 0.9 % IV BOLUS (SEPSIS)
1000.0000 mL | Freq: Once | INTRAVENOUS | Status: AC
  Administered 2016-09-02: 1000 mL via INTRAVENOUS

## 2016-09-02 MED ORDER — ONDANSETRON 8 MG PO TBDP: ORAL_TABLET | ORAL | 0 refills | Status: DC

## 2016-09-02 MED ORDER — POTASSIUM CHLORIDE CRYS ER 20 MEQ PO TBCR
40.0000 meq | EXTENDED_RELEASE_TABLET | Freq: Once | ORAL | Status: AC
  Administered 2016-09-02: 40 meq via ORAL
  Filled 2016-09-02: qty 2

## 2016-09-02 NOTE — ED Triage Notes (Signed)
Per EMS- Called to Jenny Torres by facility nurse. Pt c/o weakness, persistant nausea, intermittent vomiting. Poor appetite x 4 days. Pt is AOx 4. Denies pain. Pt uses walker. Ambulatory in room.

## 2016-09-02 NOTE — ED Notes (Signed)
Notified [facility] for transportation back to [facility].  

## 2016-09-02 NOTE — ED Provider Notes (Addendum)
[facility] DEPT Provider Note   CSN: [id number] Arrival date & time: 09/02/16  1349     History   Chief Complaint Chief Complaint  Patient presents with  . Weakness  . Nausea  . Dehydration    poor oral intake x 4 days    HPI Jenny Torres is a 82 y.o. female.History is obtained from patient and from patient's daughter, [name] via telephone with patient's permission. Patient had been vomiting for 2 days. She can't recall how many times. She is presently not nauseated and her daughter reports she looks much improved today over 2 days ago. No fever. Patient has vomiting from time to time. Etiology unclear for which she takes Zofran. Patient denies abdominal pain denies chest pain denies urinary symptoms she does admit to diminished appetite. Her daughter reports that she's had anorexia for many years. No treatment prior to coming here. No other associated symptoms. No headache  HPI  Past Medical History:  Diagnosis Date  . Anorexia   . Anxiety   . Carotid artery occlusion   . COPD (chronic obstructive pulmonary disease) ([place])   . Depression   . Diverticula, colon   . Gastritis   . GERD (gastroesophageal reflux disease)   . Hemorrhoids, external   . HTN (hypertension)   . Hyperlipidemia   . Hyperlipidemia   . Idiopathic peripheral neuropathy    chronic  . MVP (mitral valve prolapse)    stable  . Osteoporosis   . Palpitations   . Polyp of colon   . Raynaud's disease     Patient Active Problem List   Diagnosis Date Noted  . Syncope and collapse 12/02/2014  . Hyponatremia 12/02/2014  . Hyperlipidemia 12/02/2014  . COPD (chronic obstructive pulmonary disease) ([place]) 12/02/2014  . Blood loss anemia 07/16/2014  . Insomnia 02/18/2014  . Protein-calorie malnutrition, severe ([place]) 02/12/2014  . Low vitamin B12 level 12/29/2012  . Pulmonary nodule 10/21/2012  . Idiopathic peripheral neuropathy   . MVP (mitral valve prolapse)   . Osteoporosis   .  Raynaud's disease 04/24/2011  . Anxiety state 11/10/2007  . Depression 11/10/2007  . Essential hypertension 11/10/2007    Past Surgical History:  Procedure Laterality Date  . CESAREAN SECTION     x2  . CHOLECYSTECTOMY    . INTRAMEDULLARY (IM) NAIL INTERTROCHANTERIC Right 02/11/2014    OB History    No data available       Home Medications    Prior to Admission medications   Medication Sig Start Date End Date Taking? Authorizing Provider  amLODipine (NORVASC) 2.5 MG tablet Take 1 tablet (2.5 mg total) by mouth daily. 01/06/16  Yes Hoyt Koch, MD  aspirin 325 MG tablet Take 325 mg by mouth daily.    Yes Historical Provider, MD  FIBER PO Take 1 capsule by mouth daily.   Yes Historical Provider, MD  gabapentin (NEURONTIN) 800 MG tablet TAKE 1 TABLET TWICE A DAY 08/17/16  Yes Hoyt Koch, MD  LORazepam (ATIVAN) 0.5 MG tablet Take 1 tablet (0.5 mg total) by mouth daily as needed for anxiety. 12/27/15  Yes Golden Circle, FNP  metoprolol tartrate (LOPRESSOR) 25 MG tablet TAKE ONE-HALF (1/2) TABLET TWICE A DAY 08/17/16  Yes Hoyt Koch, MD  sertraline (ZOLOFT) 25 MG tablet Take 1 tablet (25 mg total) by mouth daily. 03/10/16  Yes Hoyt Koch, MD  zolpidem (AMBIEN) 5 MG tablet Take 1 tablet (5 mg total) by mouth at bedtime as needed for  sleep. 08/28/16  Yes Hoyt Koch, MD  ondansetron (ZOFRAN-ODT) 8 MG disintegrating tablet TAKE ONE TABLET AS NEEDED Patient not taking: Reported on 09/02/2016 08/31/16   Hoyt Koch, MD    Family History Family History  Problem Relation Age of Onset  . Breast cancer Mother   . Liver disease Brother   . Cirrhosis Brother     Social History Social History  Substance Use Topics  . Smoking status: Former Smoker    Packs/day: 1.00    Years: 25.00    Types: Cigarettes    Quit date: 09/22/1983  . Smokeless tobacco: Never Used  . Alcohol use Yes     Comment: rarely     Allergies   Lovastatin;  Sulfamethoxazole; Darvocet [propoxyphene n-acetaminophen]; Erythromycin; Hydrocod polst-cpm polst er; Penicillins; Theophyllines; Zovirax [acyclovir]; Ceftin; and Paroxetine hcl   Review of Systems Review of Systems  Constitutional: Positive for appetite change.  HENT: Negative.   Respiratory: Negative.   Cardiovascular: Negative.   Gastrointestinal: Positive for nausea and vomiting.       No nausea presently  Musculoskeletal: Negative.   Skin: Negative.   Neurological: Negative.   Psychiatric/Behavioral: Negative.   All other systems reviewed and are negative.    Physical Exam Updated Vital Signs BP 128/71 (BP Location: Left Arm)   Pulse 85   Temp 97.3 F (36.3 C) (Oral)   Resp 18   Ht 5\' 2"  (1.575 m)   Wt 85 lb (38.6 kg)   SpO2 98%   BMI 15.55 kg/m   Physical Exam  Constitutional: She appears well-developed and well-nourished.  HENT:  Head: Normocephalic and atraumatic.  Eyes: Conjunctivae are normal. Pupils are equal, round, and reactive to light.  Neck: Neck supple. No tracheal deviation present. No thyromegaly present.  Cardiovascular: Normal rate and regular rhythm.   No murmur heard. Pulmonary/Chest: Effort normal and breath sounds normal.  Abdominal: Soft. Bowel sounds are normal. She exhibits no distension. There is no tenderness.  Musculoskeletal: Normal range of motion. She exhibits no edema or tenderness.  Neurological: She is alert. Coordination normal.  Skin: Skin is warm and dry. No rash noted.  Psychiatric: She has a normal mood and affect.  Nursing note and vitals reviewed.    ED Treatments / Results  Labs (all labs ordered are listed, but only abnormal results are displayed) Labs Reviewed  BASIC METABOLIC PANEL - Abnormal; Notable for the following:       Result Value   Sodium 130 (*)    Potassium 3.1 (*)    Chloride 92 (*)    Glucose, Bld 54 (*)    BUN 26 (*)    All other components within normal limits  CBC - Abnormal; Notable for the  following:    WBC 12.6 (*)    HCT 35.7 (*)    MCHC 36.1 (*)    All other components within normal limits  URINALYSIS, ROUTINE W REFLEX MICROSCOPIC  HEPATIC FUNCTION PANEL  I-STAT TROPOININ, ED    EKG  EKG Interpretation  Date/Time:  Wednesday September 02 2016 14:30:48 EST Ventricular Rate:  72 PR Interval:    QRS Duration: 92 QT Interval:  433 QTC Calculation: 474 R Axis:   64 Text Interpretation:  Sinus rhythm Borderline short PR interval RSR' in V1 or V2, probably normal variant Nonspecific T abnormalities, lateral leads No significant change since last tracing Confirmed by Winfred Leeds  MD, Lourene Hoston [phone]) on 09/02/2016 2:33:55 PM       Radiology No results  found.  Procedures Procedures (including critical care time)  Medications Ordered in ED Medications  potassium chloride SA (K-DUR,KLOR-CON) CR tablet 40 mEq (not administered)  sodium chloride 0.9 % bolus 1,000 mL (not administered)    X-rays viewed by me Results for orders placed or performed during the hospital encounter of [id number]  Basic metabolic panel  Result Value Ref Range   Sodium 130 (L) 135 - 145 mmol/L   Potassium 3.1 (L) 3.5 - 5.1 mmol/L   Chloride 92 (L) 101 - 111 mmol/L   CO2 23 22 - 32 mmol/L   Glucose, Bld 54 (L) 65 - 99 mg/dL   BUN 26 (H) 6 - 20 mg/dL   Creatinine, Ser 0.61 0.44 - 1.00 mg/dL   Calcium 9.1 8.9 - 10.3 mg/dL   GFR calc non Af Amer >60 >60 mL/min   GFR calc Af Amer >60 >60 mL/min   Anion gap 15 5 - 15  CBC  Result Value Ref Range   WBC 12.6 (H) 4.0 - 10.5 K/uL   RBC 4.28 3.87 - 5.11 MIL/uL   Hemoglobin 12.9 12.0 - 15.0 g/dL   HCT 35.7 (L) 36.0 - 46.0 %   MCV 83.4 78.0 - 100.0 fL   MCH 30.1 26.0 - 34.0 pg   MCHC 36.1 (H) 30.0 - 36.0 g/dL   RDW 12.5 11.5 - 15.5 %   Platelets 351 150 - 400 K/uL  Urinalysis, Routine w reflex microscopic  Result Value Ref Range   Color, Urine YELLOW YELLOW   APPearance CLEAR CLEAR   Specific Gravity, Urine 1.011 1.005 - 1.030   pH 6.0 5.0 -  8.0   Glucose, UA NEGATIVE NEGATIVE mg/dL   Hgb urine dipstick NEGATIVE NEGATIVE   Bilirubin Urine NEGATIVE NEGATIVE   Ketones, ur 80 (A) NEGATIVE mg/dL   Protein, ur NEGATIVE NEGATIVE mg/dL   Nitrite NEGATIVE NEGATIVE   Leukocytes, UA NEGATIVE NEGATIVE  Hepatic function panel  Result Value Ref Range   Total Protein 7.2 6.5 - 8.1 g/dL   Albumin 3.9 3.5 - 5.0 g/dL   AST 29 15 - 41 U/L   ALT 18 14 - 54 U/L   Alkaline Phosphatase 104 38 - 126 U/L   Total Bilirubin 2.9 (H) 0.3 - 1.2 mg/dL   Bilirubin, Direct 0.9 (H) 0.1 - 0.5 mg/dL   Indirect Bilirubin 2.0 (H) 0.3 - 0.9 mg/dL  I-stat troponin, ED  Result Value Ref Range   Troponin i, poc 0.00 0.00 - 0.08 ng/mL   Comment 3           Dg Abd Acute W/chest  Result Date: 09/02/2016 CLINICAL DATA:  Weakness, nausea EXAM: DG ABDOMEN ACUTE W/ 1V CHEST COMPARISON:  None. FINDINGS: The lungs are hyperinflated likely secondary to COPD. There is no focal consolidation, pleural effusion or pneumothorax. The heart and mediastinal contours are unremarkable. The osseous structures are unremarkable. There is no bowel dilatation to suggest obstruction. There are no air-fluid levels. There is no evidence of pneumoperitoneum, portal venous gas, or pneumatosis. There is dextroscoliosis of the thoracolumbar spine. There is moderate osteoarthritis of the right hip. There is mild osteoarthritis of the left hip. There is old posttraumatic deformity of the right superior and inferior pubic rami. IMPRESSION: No acute abdominal pathology. No active cardiopulmonary disease. Electronically Signed   By: Kathreen Devoid   On: 09/02/2016 18:59   Initial Impression / Assessment and Plan / ED Course  I have reviewed the triage vital signs and the nursing notes.  Pertinent labs & imaging results that were available during my care of the patient were reviewed by me and considered in my medical decision making (see chart for details).  Clinical Course     9:50 PM patient  feels well. Asymptomatic. She is able to drink without difficulty. She walks with minimal assistance and not lightheaded on standing after treatment with intravenous hydration and oral potassium. I will write prescription for Zofran which patient has had in the past. Her daughter states that she may have run out Plan return or follow-up with Dr. Sharlet Salina as needed. Blood pressure recheck one to 2 weeks Final Clinical Impressions(s) / ED Diagnoses  Diagnosis #1 nausea and vomiting #2 mild dehydration #3 hypokalemia Final diagnoses:  None  #4 elevated blood pressure  New Prescriptions New Prescriptions   No medications on file     Orlie Dakin, MD 09/02/16 2159    Orlie Dakin, MD 09/02/16 2201

## 2016-09-02 NOTE — ED Notes (Signed)
[facility] transported back to facility.

## 2016-09-02 NOTE — Discharge Instructions (Signed)
Make sure that you drink at least five 8 ounce glasses of water each day in order to stay well-hydrated. Take the medication prescribed as needed for nausea. Return if you feel worse for any reason or are unable to hold down fluids without vomiting or see Dr. Sharlet Salina. Your blood pressure should be rechecked with Dr. Nathanial Millman office within the next one or 2 weeks. Today's was elevated at 174/83

## 2016-09-02 NOTE — ED Notes (Signed)
In and Out Cath was unsuccessful. No urine return in the tube. Nurse aware.

## 2016-09-07 ENCOUNTER — Telehealth: Payer: Self-pay | Admitting: Emergency Medicine

## 2016-09-07 NOTE — Telephone Encounter (Signed)
Pt called and stated she was seen in the ER, then started having problems wit h a UTI after being discharged. She didn't want to make an appt. She wanted you to give her a call back so she could ask you some question. Please advise thanks.

## 2016-09-07 NOTE — Telephone Encounter (Signed)
Jenny Torres scheduled an office visit. Patient needed an ER follow up visit.

## 2016-09-08 ENCOUNTER — Encounter: Payer: Self-pay | Admitting: Family

## 2016-09-08 ENCOUNTER — Other Ambulatory Visit (INDEPENDENT_AMBULATORY_CARE_PROVIDER_SITE_OTHER): Payer: Medicare Other

## 2016-09-08 ENCOUNTER — Ambulatory Visit (INDEPENDENT_AMBULATORY_CARE_PROVIDER_SITE_OTHER): Payer: Medicare Other | Admitting: Family

## 2016-09-08 VITALS — BP 144/80 | HR 63 | Temp 97.6°F | Resp 16 | Ht 62.0 in | Wt 87.0 lb

## 2016-09-08 DIAGNOSIS — E876 Hypokalemia: Secondary | ICD-10-CM | POA: Diagnosis not present

## 2016-09-08 DIAGNOSIS — R300 Dysuria: Secondary | ICD-10-CM

## 2016-09-08 LAB — POCT URINALYSIS DIPSTICK
Bilirubin, UA: NEGATIVE
Glucose, UA: NEGATIVE
Ketones, UA: NEGATIVE
NITRITE UA: POSITIVE
Spec Grav, UA: 1.02
UROBILINOGEN UA: NEGATIVE
pH, UA: 7.5

## 2016-09-08 LAB — BASIC METABOLIC PANEL
BUN: 8 mg/dL (ref 6–23)
CHLORIDE: 95 meq/L — AB (ref 96–112)
CO2: 32 meq/L (ref 19–32)
Calcium: 9 mg/dL (ref 8.4–10.5)
Creatinine, Ser: 0.51 mg/dL (ref 0.40–1.20)
GFR: 121.37 mL/min (ref >60.00)
Glucose, Bld: 76 mg/dL (ref 70–99)
Potassium: 4 mEq/L (ref 3.5–5.1)
SODIUM: 133 meq/L — AB (ref 135–145)

## 2016-09-08 MED ORDER — CIPROFLOXACIN HCL 500 MG PO TABS: 500.0000 mg | ORAL_TABLET | Freq: Two times a day (BID) | ORAL | 0 refills | Status: DC

## 2016-09-08 MED ORDER — PHENAZOPYRIDINE HCL 100 MG PO TABS: 100.0000 mg | ORAL_TABLET | Freq: Three times a day (TID) | ORAL | 0 refills | Status: DC | PRN

## 2016-09-08 NOTE — Assessment & Plan Note (Signed)
Noted to have mild hypokalemia in the emergency department most likely related to vomiting. Obtain basic metabolic panel to recheck potassium. Continue to monitor pending blood work results.

## 2016-09-08 NOTE — Patient Instructions (Addendum)
Thank you for choosing [facility].  SUMMARY AND INSTRUCTIONS:  Please stop at the lab to recheck your potassium.   Medication:  Please start the ciprofloxacin and complete antibiotic.  Start the pyridium as needed for burning.   Your prescription(s) have been submitted to your pharmacy or been printed and provided for you. Please take as directed and contact our office if you believe you are having problem(s) with the medication(s) or have any questions.  Follow up:  If your symptoms worsen or fail to improve, please contact our office for further instruction, or in case of emergency go directly to the emergency room at the closest medical facility.    Urinary Tract Infection, Adult A urinary tract infection (UTI) is an infection of any part of the urinary tract, which includes the kidneys, ureters, bladder, and urethra. These organs make, store, and get rid of urine in the body. UTI can be a bladder infection (cystitis) or kidney infection (pyelonephritis). What are the causes? This infection may be caused by fungi, viruses, or bacteria. Bacteria are the most common cause of UTIs. This condition can also be caused by repeated incomplete emptying of the bladder during urination. What increases the risk? This condition is more likely to develop if:  You ignore your need to urinate or hold urine for long periods of time.  You do not empty your bladder completely during urination.  You wipe back to front after urinating or having a bowel movement, if you are female.  You are uncircumcised, if you are female.  You are constipated.  You have a urinary catheter that stays in place (indwelling).  You have a weak defense (immune) system.  You have a medical condition that affects your bowels, kidneys, or bladder.  You have diabetes.  You take antibiotic medicines frequently or for long periods of time, and the antibiotics no longer work well against certain types of  infections (antibiotic resistance).  You take medicines that irritate your urinary tract.  You are exposed to chemicals that irritate your urinary tract.  You are female. What are the signs or symptoms? Symptoms of this condition include:  Fever.  Frequent urination or passing small amounts of urine frequently.  Needing to urinate urgently.  Pain or burning with urination.  Urine that smells bad or unusual.  Cloudy urine.  Pain in the lower abdomen or back.  Trouble urinating.  Blood in the urine.  Vomiting or being less hungry than normal.  Diarrhea or abdominal pain.  Vaginal discharge, if you are female. How is this diagnosed? This condition is diagnosed with a medical history and physical exam. You will also need to provide a urine sample to test your urine. Other tests may be done, including:  Blood tests.  Sexually transmitted disease (STD) testing. If you have had more than one UTI, a cystoscopy or imaging studies may be done to determine the cause of the infections. How is this treated? Treatment for this condition often includes a combination of two or more of the following:  Antibiotic medicine.  Other medicines to treat less common causes of UTI.  Over-the-counter medicines to treat pain.  Drinking enough water to stay hydrated. Follow these instructions at home:  Take over-the-counter and prescription medicines only as told by your health care provider.  If you were prescribed an antibiotic, take it as told by your health care provider. Do not stop taking the antibiotic even if you start to feel better.  Avoid alcohol, caffeine, tea,  and carbonated beverages. They can irritate your bladder.  Drink enough fluid to keep your urine clear or pale yellow.  Keep all follow-up visits as told by your health care provider. This is important.  Make sure to:  Empty your bladder often and completely. Do not hold urine for long periods of time.  Empty  your bladder before and after sex.  Wipe from front to back after a bowel movement if you are female. Use each tissue one time when you wipe. Contact a health care provider if:  You have back pain.  You have a fever.  You feel nauseous or vomit.  Your symptoms do not get better after 3 days.  Your symptoms go away and then return. Get help right away if:  You have severe back pain or lower abdominal pain.  You are vomiting and cannot keep down any medicines or water. This information is not intended to replace advice given to you by your health care provider. Make sure you discuss any questions you have with your health care provider. Document Released: 06/17/2005 Document Revised: 02/19/2016 Document Reviewed: 07/29/2015 Elsevier Interactive Patient Education  2017 [employer].

## 2016-09-08 NOTE — Progress Notes (Signed)
Subjective:    Patient ID: Jenny Torres, female    DOB: April 26, 1928, 82 y.o.   MRN: [id number]  Chief Complaint  Patient presents with  . Hospitalization Follow-up    has some pain when urination, had a catheter in the hospital last week    HPI:  Jenny Torres is a 82 y.o. female who  has a past medical history of Anorexia; Anxiety; Carotid artery occlusion; COPD (chronic obstructive pulmonary disease) ([place]); Depression; Diverticula, colon; Gastritis; GERD (gastroesophageal reflux disease); Hemorrhoids, external; HTN (hypertension); Hyperlipidemia; Hyperlipidemia; Idiopathic peripheral neuropathy; MVP (mitral valve prolapse); Osteoporosis; Palpitations; Polyp of colon; and Raynaud's disease. and presents today for an acute office visit.   This is a new problem. Associated symptom of dysuria, urinary frequency and urgency have been going on for about 4 days since leaving the hospital. Notes that she did receive and in and out catheter while she was in the ED. No fevers or flank/back pain. Modifying factors include cranberry juice which may help with the symptoms. She was also noted to have hypokalemia during her emergency room visit. Not currently experiencing chest pain, shortness of breath, or muscle spasms.  Allergies  Allergen Reactions  . Lovastatin Other (See Comments)    unknown  . Sulfamethoxazole Swelling    Swelling of the throat, mouth, tongue, nose  . Darvocet [Propoxyphene N-Acetaminophen] Itching  . Erythromycin Nausea And Vomiting    Upset stomach  . Hydrocod Polst-Cpm Polst Er Nausea Only    nausea  . Penicillins Other (See Comments)    Has patient had a PCN reaction causing immediate rash, facial/tongue/throat swelling, SOB or lightheadedness with hypotension: No Has patient had a PCN reaction causing severe rash involving mucus membranes or skin necrosis: No Has patient had a PCN reaction that required hospitalization No Has patient had a PCN reaction occurring  within the last 10 years: No If all of the above answers are "NO", then may proceed with Cephalosporin use.   . Theophyllines Other (See Comments)    Unknown reaction  . Zovirax [Acyclovir] Other (See Comments)    Per patient lots of side effects  . Ceftin Diarrhea    GI problems  . Paroxetine Hcl Other (See Comments)    jittery      Outpatient Medications Prior to Visit  Medication Sig Dispense Refill  . amLODipine (NORVASC) 2.5 MG tablet Take 1 tablet (2.5 mg total) by mouth daily. 90 tablet 3  . aspirin 325 MG tablet Take 325 mg by mouth daily.     Marland Kitchen FIBER PO Take 1 capsule by mouth daily.    Marland Kitchen gabapentin (NEURONTIN) 800 MG tablet TAKE 1 TABLET TWICE A DAY 180 tablet 1  . LORazepam (ATIVAN) 0.5 MG tablet Take 1 tablet (0.5 mg total) by mouth daily as needed for anxiety. 30 tablet 0  . metoprolol tartrate (LOPRESSOR) 25 MG tablet TAKE ONE-HALF (1/2) TABLET TWICE A DAY 90 tablet 1  . ondansetron (ZOFRAN ODT) 8 MG disintegrating tablet 8mg  ODT q8 hours prn nausea 12 tablet 0  . sertraline (ZOLOFT) 25 MG tablet Take 1 tablet (25 mg total) by mouth daily. 90 tablet 1  . zolpidem (AMBIEN) 5 MG tablet Take 1 tablet (5 mg total) by mouth at bedtime as needed for sleep. 10 tablet 0   No facility-administered medications prior to visit.      Review of Systems  Constitutional: Negative for chills and fever.  Genitourinary: Positive for dysuria, frequency and urgency. Negative for flank pain  and hematuria.      Objective:    BP (!) 144/80 (BP Location: Left Arm, Patient Position: Sitting, Cuff Size: Normal)   Pulse 63   Temp 97.6 F (36.4 C) (Oral)   Resp 16   Ht 5\' 2"  (1.575 m)   Wt 87 lb (39.5 kg)   SpO2 98%   BMI 15.91 kg/m  Nursing note and vital signs reviewed.  Physical Exam  Constitutional: She is oriented to person, place, and time. She appears well-developed and well-nourished.  Non-toxic appearance. She does not have a sickly appearance. She does not appear ill. No  distress.  Cardiovascular: Normal rate, regular rhythm, normal heart sounds and intact distal pulses.   Pulmonary/Chest: Effort normal and breath sounds normal.  Abdominal: There is no CVA tenderness.  Neurological: She is alert and oriented to person, place, and time.  Skin: Skin is warm and dry.  Psychiatric: She has a normal mood and affect. Her behavior is normal. Judgment and thought content normal.       Assessment & Plan:   Problem List Items Addressed This Visit      Other   Hypokalemia    Noted to have mild hypokalemia in the emergency department most likely related to vomiting. Obtain basic metabolic panel to recheck potassium. Continue to monitor pending blood work results.      Relevant Orders   Basic Metabolic Panel (BMET) (Completed)   Dysuria - Primary    In office urinalysis positive for leukocytes, nitrites, and hematuria. Symptoms and exam are consistent with urinary tract infection. Start ciprofloxacin. Urine will be sent for culture for confirmation. Start Pyridium as needed for dysuria. Follow-up if symptoms worsen or fail to improve.      Relevant Medications   ciprofloxacin (CIPRO) 500 MG tablet   phenazopyridine (PYRIDIUM) 100 MG tablet   Other Relevant Orders   POCT urinalysis dipstick (Completed)   Urine culture   Basic Metabolic Panel (BMET) (Completed)       I am having Jenny Torres start on ciprofloxacin and phenazopyridine. I am also having her maintain her aspirin, LORazepam, amLODipine, sertraline, gabapentin, metoprolol tartrate, zolpidem, FIBER PO, and ondansetron.   Meds ordered this encounter  Medications  . ciprofloxacin (CIPRO) 500 MG tablet    Sig: Take 1 tablet (500 mg total) by mouth 2 (two) times daily.    Dispense:  10 tablet    Refill:  0    Please deliver medication per patient request.    Order Specific Question:   Supervising Provider    Answer:   Pricilla Holm A [id number]  . phenazopyridine (PYRIDIUM) 100 MG tablet     Sig: Take 1 tablet (100 mg total) by mouth 3 (three) times daily as needed for pain.    Dispense:  20 tablet    Refill:  0    Please deliver medication per patient request.    Order Specific Question:   Supervising Provider    Answer:   Pricilla Holm A [id number]     Follow-up: Return if symptoms worsen or fail to improve.  Mauricio Po, FNP

## 2016-09-08 NOTE — Assessment & Plan Note (Signed)
In office urinalysis positive for leukocytes, nitrites, and hematuria. Symptoms and exam are consistent with urinary tract infection. Start ciprofloxacin. Urine will be sent for culture for confirmation. Start Pyridium as needed for dysuria. Follow-up if symptoms worsen or fail to improve.

## 2016-09-10 ENCOUNTER — Other Ambulatory Visit: Payer: Self-pay | Admitting: Family

## 2016-09-10 LAB — URINE CULTURE

## 2016-09-10 MED ORDER — NITROFURANTOIN MONOHYD MACRO 100 MG PO CAPS: 100.0000 mg | ORAL_CAPSULE | Freq: Two times a day (BID) | ORAL | 0 refills | Status: DC

## 2016-09-14 ENCOUNTER — Emergency Department (HOSPITAL_COMMUNITY): Payer: Medicare Other

## 2016-09-14 ENCOUNTER — Inpatient Hospital Stay (HOSPITAL_COMMUNITY)
Admission: EM | Admit: 2016-09-14 | Discharge: 2016-09-17 | DRG: 689 | Disposition: A | Discharge status: Home Health | Payer: Medicare Other | Attending: Internal Medicine | Admitting: Internal Medicine

## 2016-09-14 ENCOUNTER — Encounter (HOSPITAL_COMMUNITY): Payer: Self-pay | Admitting: Emergency Medicine

## 2016-09-14 DIAGNOSIS — I6529 Occlusion and stenosis of unspecified carotid artery: Secondary | ICD-10-CM | POA: Diagnosis present

## 2016-09-14 DIAGNOSIS — Z8379 Family history of other diseases of the digestive system: Secondary | ICD-10-CM

## 2016-09-14 DIAGNOSIS — Z885 Allergy status to narcotic agent status: Secondary | ICD-10-CM

## 2016-09-14 DIAGNOSIS — I341 Nonrheumatic mitral (valve) prolapse: Secondary | ICD-10-CM | POA: Diagnosis present

## 2016-09-14 DIAGNOSIS — J439 Emphysema, unspecified: Secondary | ICD-10-CM | POA: Diagnosis present

## 2016-09-14 DIAGNOSIS — R112 Nausea with vomiting, unspecified: Secondary | ICD-10-CM | POA: Diagnosis not present

## 2016-09-14 DIAGNOSIS — R000 Tachycardia, unspecified: Secondary | ICD-10-CM

## 2016-09-14 DIAGNOSIS — E860 Dehydration: Secondary | ICD-10-CM | POA: Diagnosis present

## 2016-09-14 DIAGNOSIS — F4322 Adjustment disorder with anxiety: Secondary | ICD-10-CM | POA: Diagnosis present

## 2016-09-14 DIAGNOSIS — F329 Major depressive disorder, single episode, unspecified: Secondary | ICD-10-CM | POA: Diagnosis present

## 2016-09-14 DIAGNOSIS — Z7982 Long term (current) use of aspirin: Secondary | ICD-10-CM | POA: Diagnosis not present

## 2016-09-14 DIAGNOSIS — I10 Essential (primary) hypertension: Secondary | ICD-10-CM | POA: Diagnosis present

## 2016-09-14 DIAGNOSIS — I160 Hypertensive urgency: Secondary | ICD-10-CM | POA: Diagnosis present

## 2016-09-14 DIAGNOSIS — Z86010 Personal history of colonic polyps: Secondary | ICD-10-CM

## 2016-09-14 DIAGNOSIS — Z681 Body mass index (BMI) 19 or less, adult: Secondary | ICD-10-CM

## 2016-09-14 DIAGNOSIS — Z888 Allergy status to other drugs, medicaments and biological substances status: Secondary | ICD-10-CM

## 2016-09-14 DIAGNOSIS — M6281 Muscle weakness (generalized): Secondary | ICD-10-CM

## 2016-09-14 DIAGNOSIS — R6510 Systemic inflammatory response syndrome (SIRS) of non-infectious origin without acute organ dysfunction: Secondary | ICD-10-CM | POA: Diagnosis present

## 2016-09-14 DIAGNOSIS — N390 Urinary tract infection, site not specified: Secondary | ICD-10-CM

## 2016-09-14 DIAGNOSIS — Z66 Do not resuscitate: Secondary | ICD-10-CM | POA: Diagnosis present

## 2016-09-14 DIAGNOSIS — M810 Age-related osteoporosis without current pathological fracture: Secondary | ICD-10-CM | POA: Diagnosis present

## 2016-09-14 DIAGNOSIS — K219 Gastro-esophageal reflux disease without esophagitis: Secondary | ICD-10-CM | POA: Diagnosis present

## 2016-09-14 DIAGNOSIS — G43A1 Cyclical vomiting, intractable: Secondary | ICD-10-CM | POA: Diagnosis not present

## 2016-09-14 DIAGNOSIS — L89159 Pressure ulcer of sacral region, unspecified stage: Secondary | ICD-10-CM | POA: Diagnosis not present

## 2016-09-14 DIAGNOSIS — E785 Hyperlipidemia, unspecified: Secondary | ICD-10-CM | POA: Diagnosis present

## 2016-09-14 DIAGNOSIS — R5381 Other malaise: Secondary | ICD-10-CM

## 2016-09-14 DIAGNOSIS — Z881 Allergy status to other antibiotic agents status: Secondary | ICD-10-CM

## 2016-09-14 DIAGNOSIS — R54 Age-related physical debility: Secondary | ICD-10-CM | POA: Diagnosis present

## 2016-09-14 DIAGNOSIS — G609 Hereditary and idiopathic neuropathy, unspecified: Secondary | ICD-10-CM | POA: Diagnosis present

## 2016-09-14 DIAGNOSIS — K2970 Gastritis, unspecified, without bleeding: Secondary | ICD-10-CM | POA: Diagnosis present

## 2016-09-14 DIAGNOSIS — Z87891 Personal history of nicotine dependence: Secondary | ICD-10-CM

## 2016-09-14 DIAGNOSIS — E876 Hypokalemia: Secondary | ICD-10-CM | POA: Diagnosis present

## 2016-09-14 DIAGNOSIS — Z22322 Carrier or suspected carrier of Methicillin resistant Staphylococcus aureus: Secondary | ICD-10-CM

## 2016-09-14 DIAGNOSIS — Z882 Allergy status to sulfonamides status: Secondary | ICD-10-CM

## 2016-09-14 DIAGNOSIS — F419 Anxiety disorder, unspecified: Secondary | ICD-10-CM | POA: Diagnosis present

## 2016-09-14 DIAGNOSIS — R0682 Tachypnea, not elsewhere classified: Secondary | ICD-10-CM | POA: Diagnosis present

## 2016-09-14 DIAGNOSIS — I7300 Raynaud's syndrome without gangrene: Secondary | ICD-10-CM | POA: Diagnosis present

## 2016-09-14 DIAGNOSIS — Z803 Family history of malignant neoplasm of breast: Secondary | ICD-10-CM

## 2016-09-14 DIAGNOSIS — E43 Unspecified severe protein-calorie malnutrition: Secondary | ICD-10-CM | POA: Diagnosis present

## 2016-09-14 DIAGNOSIS — L8990 Pressure ulcer of unspecified site, unspecified stage: Secondary | ICD-10-CM | POA: Insufficient documentation

## 2016-09-14 DIAGNOSIS — Z880 Allergy status to penicillin: Secondary | ICD-10-CM

## 2016-09-14 DIAGNOSIS — F5000 Anorexia nervosa, unspecified: Secondary | ICD-10-CM | POA: Diagnosis not present

## 2016-09-14 LAB — CBC WITH DIFFERENTIAL/PLATELET
BASOS ABS: 0 10*3/uL (ref 0.0–0.1)
Basophils Relative: 0 %
EOS PCT: 0 %
Eosinophils Absolute: 0 10*3/uL (ref 0.0–0.7)
HCT: 41.5 % (ref 36.0–46.0)
HEMOGLOBIN: 14.5 g/dL (ref 12.0–15.0)
LYMPHS ABS: 1 10*3/uL (ref 0.7–4.0)
LYMPHS PCT: 6 %
MCH: 30 pg (ref 26.0–34.0)
MCHC: 34.9 g/dL (ref 30.0–36.0)
MCV: 85.7 fL (ref 78.0–100.0)
Monocytes Absolute: 1.5 10*3/uL — ABNORMAL HIGH (ref 0.1–1.0)
Monocytes Relative: 9 %
NEUTROS ABS: 15.4 10*3/uL — AB (ref 1.7–7.7)
NEUTROS PCT: 85 %
Platelets: 455 10*3/uL — ABNORMAL HIGH (ref 150–400)
RBC: 4.84 MIL/uL (ref 3.87–5.11)
RDW: 13.8 % (ref 11.5–15.5)
WBC: 18 10*3/uL — AB (ref 4.0–10.5)

## 2016-09-14 LAB — URINALYSIS, ROUTINE W REFLEX MICROSCOPIC
BACTERIA UA: NONE SEEN
Bilirubin Urine: NEGATIVE
Glucose, UA: 50 mg/dL — AB
KETONES UR: 20 mg/dL — AB
Leukocytes, UA: NEGATIVE
Nitrite: NEGATIVE
PROTEIN: 30 mg/dL — AB
Specific Gravity, Urine: 1.024 (ref 1.005–1.030)
Squamous Epithelial / LPF: NONE SEEN
pH: 5 (ref 5.0–8.0)

## 2016-09-14 LAB — TROPONIN I: Troponin I: <0.03 ng/mL (ref <0.03)

## 2016-09-14 LAB — ETHANOL

## 2016-09-14 LAB — RAPID URINE DRUG SCREEN, HOSP PERFORMED
AMPHETAMINES: NOT DETECTED
BENZODIAZEPINES: NOT DETECTED
Barbiturates: NOT DETECTED
Cocaine: NOT DETECTED
Opiates: NOT DETECTED
TETRAHYDROCANNABINOL: NOT DETECTED

## 2016-09-14 LAB — COMPREHENSIVE METABOLIC PANEL
ALBUMIN: 4 g/dL (ref 3.5–5.0)
ALT: 19 U/L (ref 14–54)
AST: 27 U/L (ref 15–41)
Alkaline Phosphatase: 100 U/L (ref 38–126)
Anion gap: 16 — ABNORMAL HIGH (ref 5–15)
BUN: 49 mg/dL — AB (ref 6–20)
CHLORIDE: 100 mmol/L — AB (ref 101–111)
CO2: 22 mmol/L (ref 22–32)
CREATININE: 0.82 mg/dL (ref 0.44–1.00)
Calcium: 9.1 mg/dL (ref 8.9–10.3)
GFR calc Af Amer: >60 mL/min (ref >60)
GLUCOSE: 141 mg/dL — AB (ref 65–99)
POTASSIUM: 3.1 mmol/L — AB (ref 3.5–5.1)
Sodium: 138 mmol/L (ref 135–145)
Total Bilirubin: 1.1 mg/dL (ref 0.3–1.2)
Total Protein: 7.9 g/dL (ref 6.5–8.1)

## 2016-09-14 LAB — TSH: TSH: 1.201 u[IU]/mL (ref 0.350–4.500)

## 2016-09-14 LAB — LACTIC ACID, PLASMA
LACTIC ACID, VENOUS: 2.2 mmol/L — AB (ref 0.5–1.9)
LACTIC ACID, VENOUS: 2.3 mmol/L — AB (ref 0.5–1.9)
Lactic Acid, Venous: 2.6 mmol/L (ref 0.5–1.9)

## 2016-09-14 LAB — LIPASE, BLOOD: LIPASE: 37 U/L (ref 11–51)

## 2016-09-14 MED ORDER — ONDANSETRON HCL 4 MG/2ML IJ SOLN
4.0000 mg | INTRAMUSCULAR | Status: DC | PRN
  Administered 2016-09-14: 4 mg via INTRAVENOUS
  Filled 2016-09-14: qty 2

## 2016-09-14 MED ORDER — POTASSIUM CHLORIDE CRYS ER 20 MEQ PO TBCR: 40.0000 meq | EXTENDED_RELEASE_TABLET | Freq: Once | ORAL | Status: DC

## 2016-09-14 MED ORDER — KCL IN DEXTROSE-NACL 20-5-0.45 MEQ/L-%-% IV SOLN
INTRAVENOUS | Status: DC
  Administered 2016-09-14 – 2016-09-17 (×7): via INTRAVENOUS
  Filled 2016-09-14 (×9): qty 1000

## 2016-09-14 MED ORDER — SERTRALINE HCL 50 MG PO TABS
25.0000 mg | ORAL_TABLET | Freq: Every day | ORAL | Status: DC
  Administered 2016-09-14 – 2016-09-17 (×4): 25 mg via ORAL
  Filled 2016-09-14 (×4): qty 1

## 2016-09-14 MED ORDER — SODIUM CHLORIDE 0.9 % IV BOLUS (SEPSIS)
500.0000 mL | Freq: Once | INTRAVENOUS | Status: AC
  Administered 2016-09-14: 500 mL via INTRAVENOUS

## 2016-09-14 MED ORDER — GI COCKTAIL ~~LOC~~
30.0000 mL | Freq: Once | ORAL | Status: AC
  Administered 2016-09-14: 30 mL via ORAL
  Filled 2016-09-14: qty 30

## 2016-09-14 MED ORDER — GABAPENTIN 400 MG PO CAPS
800.0000 mg | ORAL_CAPSULE | Freq: Two times a day (BID) | ORAL | Status: DC
  Administered 2016-09-14: 800 mg via ORAL
  Filled 2016-09-14 (×2): qty 2

## 2016-09-14 MED ORDER — SODIUM CHLORIDE 0.9% FLUSH
3.0000 mL | Freq: Two times a day (BID) | INTRAVENOUS | Status: DC
  Administered 2016-09-14 – 2016-09-17 (×3): 3 mL via INTRAVENOUS

## 2016-09-14 MED ORDER — ACETAMINOPHEN 325 MG PO TABS: 650.0000 mg | ORAL_TABLET | Freq: Four times a day (QID) | ORAL | Status: DC | PRN

## 2016-09-14 MED ORDER — PANTOPRAZOLE SODIUM 40 MG IV SOLR
40.0000 mg | Freq: Two times a day (BID) | INTRAVENOUS | Status: DC
  Administered 2016-09-14 – 2016-09-15 (×3): 40 mg via INTRAVENOUS
  Filled 2016-09-14 (×3): qty 40

## 2016-09-14 MED ORDER — AMLODIPINE BESYLATE 5 MG PO TABS
2.5000 mg | ORAL_TABLET | Freq: Every day | ORAL | Status: DC
  Administered 2016-09-14 – 2016-09-17 (×4): 2.5 mg via ORAL
  Filled 2016-09-14 (×4): qty 1

## 2016-09-14 MED ORDER — DEXTROSE 5 % IV SOLN
1.0000 g | INTRAVENOUS | Status: DC
  Administered 2016-09-15 – 2016-09-16 (×2): 1 g via INTRAVENOUS
  Filled 2016-09-14 (×2): qty 10

## 2016-09-14 MED ORDER — PHENAZOPYRIDINE HCL 100 MG PO TABS
100.0000 mg | ORAL_TABLET | Freq: Three times a day (TID) | ORAL | Status: DC
  Administered 2016-09-15 – 2016-09-17 (×8): 100 mg via ORAL
  Filled 2016-09-14 (×9): qty 1

## 2016-09-14 MED ORDER — POTASSIUM CHLORIDE 10 MEQ/100ML IV SOLN
10.0000 meq | INTRAVENOUS | Status: AC
  Administered 2016-09-14 (×3): 10 meq via INTRAVENOUS
  Filled 2016-09-14 (×3): qty 100

## 2016-09-14 MED ORDER — DEXTROSE 5 % IV SOLN
1.0000 g | Freq: Once | INTRAVENOUS | Status: AC
  Administered 2016-09-14: 1 g via INTRAVENOUS
  Filled 2016-09-14: qty 10

## 2016-09-14 MED ORDER — SODIUM CHLORIDE 0.9 % IV SOLN
INTRAVENOUS | Status: DC
  Administered 2016-09-14: 15:00:00 via INTRAVENOUS

## 2016-09-14 MED ORDER — METOPROLOL TARTRATE 5 MG/5ML IV SOLN: 2.5000 mg | Freq: Four times a day (QID) | INTRAVENOUS | Status: DC | PRN

## 2016-09-14 MED ORDER — ENSURE ENLIVE PO LIQD
237.0000 mL | Freq: Two times a day (BID) | ORAL | Status: DC
  Administered 2016-09-15 (×2): 237 mL via ORAL

## 2016-09-14 MED ORDER — METOPROLOL TARTRATE 25 MG PO TABS
12.5000 mg | ORAL_TABLET | Freq: Two times a day (BID) | ORAL | Status: DC
  Administered 2016-09-14 – 2016-09-17 (×6): 12.5 mg via ORAL
  Filled 2016-09-14 (×6): qty 1

## 2016-09-14 MED ORDER — ACETAMINOPHEN 650 MG RE SUPP: 650.0000 mg | Freq: Four times a day (QID) | RECTAL | Status: DC | PRN

## 2016-09-14 MED ORDER — SODIUM CHLORIDE 0.9 % IV SOLN
8.0000 mg | Freq: Three times a day (TID) | INTRAVENOUS | Status: DC
  Administered 2016-09-14 – 2016-09-17 (×9): 8 mg via INTRAVENOUS
  Filled 2016-09-14 (×10): qty 4

## 2016-09-14 MED ORDER — SODIUM CHLORIDE 0.9 % IV SOLN: INTRAVENOUS | Status: DC

## 2016-09-14 MED ORDER — METOPROLOL TARTRATE 5 MG/5ML IV SOLN
2.5000 mg | Freq: Once | INTRAVENOUS | Status: AC
  Administered 2016-09-14: 2.5 mg via INTRAVENOUS
  Filled 2016-09-14: qty 5

## 2016-09-14 MED ORDER — ENOXAPARIN SODIUM 30 MG/0.3ML ~~LOC~~ SOLN
30.0000 mg | SUBCUTANEOUS | Status: DC
  Administered 2016-09-14 – 2016-09-16 (×3): 30 mg via SUBCUTANEOUS
  Filled 2016-09-14 (×3): qty 0.3

## 2016-09-14 MED ORDER — SODIUM CHLORIDE 0.9 % IV SOLN: 30.0000 meq | Freq: Once | INTRAVENOUS | Status: DC

## 2016-09-14 MED ORDER — PROMETHAZINE HCL 25 MG/ML IJ SOLN: 12.5000 mg | Freq: Four times a day (QID) | INTRAMUSCULAR | Status: DC | PRN

## 2016-09-14 MED ORDER — ZOLPIDEM TARTRATE 5 MG PO TABS
5.0000 mg | ORAL_TABLET | Freq: Every evening | ORAL | Status: DC | PRN
  Administered 2016-09-14 – 2016-09-15 (×2): 5 mg via ORAL
  Filled 2016-09-14 (×2): qty 1

## 2016-09-14 NOTE — ED Provider Notes (Signed)
[facility] DEPT Provider Note   CSN: [id number] Arrival date & time: 09/14/16  1131     History   Chief Complaint Chief Complaint  Patient presents with  . N/V    HPI Jenny Torres is a 82 y.o. female.  HPI  Pt was seen at 1225. Per pt's family and pt: c/o gradual onset and worsening of persistent N/V for the past 1 week. Pt states she was evaluated 2 weeks ago for same complaint, but improved. Pt's family states pt has called EMS "4 times" this past week, as well as been evaluated by her PMD. Pt was dx with UTI by her PMD and was rx abx. Pt's family does not believe pt has been taking the antibiotics. Pt lives in independent living with twice/week nursing care. Pt's family states pt has not allowed them to visit her the past few times. Denies CP/palpitations, no SOB/cough, no abd pain, no diarrhea, no black or blood in stools or emesis, no fevers.    Past Medical History:  Diagnosis Date  . Anorexia   . Anxiety   . Carotid artery occlusion   . COPD (chronic obstructive pulmonary disease) ([place])   . Depression   . Diverticula, colon   . Gastritis   . GERD (gastroesophageal reflux disease)   . Hemorrhoids, external   . HTN (hypertension)   . Hyperlipidemia   . Hyperlipidemia   . Idiopathic peripheral neuropathy    chronic  . MVP (mitral valve prolapse)    stable  . Osteoporosis   . Palpitations   . Polyp of colon   . Raynaud's disease     Patient Active Problem List   Diagnosis Date Noted  . Hypokalemia 09/08/2016  . Dysuria 09/08/2016  . Syncope and collapse 12/02/2014  . Hyponatremia 12/02/2014  . Hyperlipidemia 12/02/2014  . COPD (chronic obstructive pulmonary disease) ([place]) 12/02/2014  . Blood loss anemia 07/16/2014  . Insomnia 02/18/2014  . Protein-calorie malnutrition, severe ([place]) 02/12/2014  . Low vitamin B12 level 12/29/2012  . Pulmonary nodule 10/21/2012  . Idiopathic peripheral neuropathy   . MVP (mitral valve prolapse)   . Osteoporosis     . Raynaud's disease 04/24/2011  . Anxiety state 11/10/2007  . Depression 11/10/2007  . Essential hypertension 11/10/2007    Past Surgical History:  Procedure Laterality Date  . CESAREAN SECTION     x2  . CHOLECYSTECTOMY    . INTRAMEDULLARY (IM) NAIL INTERTROCHANTERIC Right 02/11/2014    OB History    No data available       Home Medications    Prior to Admission medications   Medication Sig Start Date End Date Taking? Authorizing Provider  amLODipine (NORVASC) 2.5 MG tablet Take 1 tablet (2.5 mg total) by mouth daily. 01/06/16  Yes Hoyt Koch, MD  aspirin 325 MG tablet Take 325 mg by mouth daily.    Yes Historical Provider, MD  ciprofloxacin (CIPRO) 500 MG tablet Take 1 tablet (500 mg total) by mouth 2 (two) times daily. 09/08/16  Yes Golden Circle, FNP  FIBER PO Take 1 capsule by mouth daily.   Yes Historical Provider, MD  gabapentin (NEURONTIN) 800 MG tablet TAKE 1 TABLET TWICE A DAY 08/17/16  Yes Hoyt Koch, MD  metoprolol tartrate (LOPRESSOR) 25 MG tablet TAKE ONE-HALF (1/2) TABLET TWICE A DAY 08/17/16  Yes Hoyt Koch, MD  sertraline (ZOLOFT) 25 MG tablet Take 1 tablet (25 mg total) by mouth daily. 03/10/16  Yes Hoyt Koch, MD  zolpidem (AMBIEN) 5 MG tablet Take 1 tablet (5 mg total) by mouth at bedtime as needed for sleep. 08/28/16  Yes Hoyt Koch, MD  LORazepam (ATIVAN) 0.5 MG tablet Take 1 tablet (0.5 mg total) by mouth daily as needed for anxiety. 12/27/15   Golden Circle, FNP  nitrofurantoin, macrocrystal-monohydrate, (MACROBID) 100 MG capsule Take 1 capsule (100 mg total) by mouth 2 (two) times daily. 09/10/16   Golden Circle, FNP  ondansetron (ZOFRAN ODT) 8 MG disintegrating tablet 8mg  ODT q8 hours prn nausea 09/02/16   Orlie Dakin, MD  phenazopyridine (PYRIDIUM) 100 MG tablet Take 1 tablet (100 mg total) by mouth 3 (three) times daily as needed for pain. 09/08/16   Golden Circle, FNP    Family  History Family History  Problem Relation Age of Onset  . Breast cancer Mother   . Liver disease Brother   . Cirrhosis Brother     Social History Social History  Substance Use Topics  . Smoking status: Former Smoker    Packs/day: 1.00    Years: 25.00    Types: Cigarettes    Quit date: 09/22/1983  . Smokeless tobacco: Never Used  . Alcohol use Yes     Comment: rarely     Allergies   Lovastatin; Sulfamethoxazole; Darvocet [propoxyphene n-acetaminophen]; Erythromycin; Hydrocod polst-cpm polst er; Penicillins; Theophyllines; Zovirax [acyclovir]; Ceftin; and Paroxetine hcl   Review of Systems Review of Systems ROS: Statement: All systems negative except as marked or noted in the HPI; Constitutional: Negative for fever and chills. ; ; Eyes: Negative for eye pain, redness and discharge. ; ; ENMT: Negative for ear pain, hoarseness, nasal congestion, sinus pressure and sore throat. ; ; Cardiovascular: Negative for chest pain, palpitations, diaphoresis, dyspnea and peripheral edema. ; ; Respiratory: Negative for cough, wheezing and stridor. ; ; Gastrointestinal: +N/V. Negative for diarrhea, abdominal pain, blood in stool, hematemesis, jaundice and rectal bleeding. . ; ; Genitourinary: Negative for dysuria, flank pain and hematuria. ; ; Musculoskeletal: Negative for back pain and neck pain. Negative for swelling and trauma.; ; Skin: Negative for pruritus, rash, abrasions, blisters, bruising and skin lesion.; ; Neuro: Negative for headache, lightheadedness and neck stiffness. Negative for weakness, altered level of consciousness, altered mental status, extremity weakness, paresthesias, involuntary movement, seizure and syncope.      Physical Exam Updated Vital Signs BP (!) 196/102 (BP Location: Left Arm)   Pulse 117   Temp 100 F (37.8 C) (Rectal)   Resp 22   SpO2 93%   14:31 Orthostatic Vital Signs KB  Orthostatic Lying   BP- Lying:  196/102  Pulse- Lying: 120      Orthostatic  Sitting  BP- Sitting:  196/104  Pulse- Sitting: 108      Orthostatic Standing at 0 minutes  BP- Standing at 0 minutes: 179/88  Pulse- Standing at 0 minutes: 104    Patient Vitals for the past 24 hrs:  BP Temp Temp src Pulse Resp SpO2  09/14/16 1500 (!) 203/93 - - 118 (!) 33 92 %  09/14/16 1433 (!) 196/102 - - 117 22 93 %  09/14/16 1325 192/94 - - 105 14 99 %  09/14/16 1218 - 100 F (37.8 C) Rectal - - -  09/14/16 1154 171/98 - - 109 - -  09/14/16 1138 (!) 196/103 - - (!) 59 18 96 %     Physical Exam 1230: Physical examination:  Nursing notes reviewed; Vital signs and O2 SAT reviewed;  Constitutional: Thin, frail. In  no acute distress; Head:  Normocephalic, atraumatic; Eyes: EOMI, PERRL, No scleral icterus; ENMT: Mouth and pharynx normal, Mucous membranes dry; Neck: Supple, Full range of motion, No lymphadenopathy; Cardiovascular: Tachycardic rate and rhythm, No gallop; Respiratory: Breath sounds clear & equal bilaterally, No wheezes.  Speaking full sentences with ease, Normal respiratory effort/excursion; Chest: Nontender, Movement normal; Abdomen: Soft, Nontender, Nondistended, Normal bowel sounds; Genitourinary: No CVA tenderness; Extremities: Pulses normal, No tenderness, No edema, No calf edema or asymmetry.; Neuro: AA&Ox3, Major CN grossly intact. No facial droop. Speech clear. No gross focal motor or sensory deficits in extremities.; Skin: Color normal, Warm, Dry.   ED Treatments / Results  Labs (all labs ordered are listed, but only abnormal results are displayed)   EKG  EKG Interpretation None       Radiology   Procedures Procedures (including critical care time)  Medications Ordered in ED Medications  ondansetron (ZOFRAN) injection 4 mg (4 mg Intravenous Given 09/14/16 1413)  0.9 %  sodium chloride infusion ( Intravenous New Bag/Given 09/14/16 1447)  potassium chloride SA (K-DUR,KLOR-CON) CR tablet 40 mEq (not administered)  sodium chloride 0.9 % bolus 500  mL (0 mLs Intravenous Stopped 09/14/16 1448)     Initial Impression / Assessment and Plan / ED Course  I have reviewed the triage vital signs and the nursing notes.  Pertinent labs & imaging results that were available during my care of the patient were reviewed by me and considered in my medical decision making (see chart for details).  MDM Reviewed: previous chart, nursing note and vitals Reviewed previous: labs and ECG Interpretation: labs, x-ray, CT scan and ECG   ED ECG REPORT   Date: 09/14/2016  Rate: 105  Rhythm: sinus tachycardia  QRS Axis: normal  Intervals: QT prolonged  ST/T Wave abnormalities: normal  Conduction Disutrbances:none  Narrative Interpretation:   Old EKG Reviewed: changes noted; compared to EKG dated 09/14/2016, QT has lengthened.   Results for orders placed or performed during the hospital encounter of 09/14/16  Urinalysis, Routine w reflex microscopic  Result Value Ref Range   Color, Urine YELLOW YELLOW   APPearance HAZY (A) CLEAR   Specific Gravity, Urine 1.024 1.005 - 1.030   pH 5.0 5.0 - 8.0   Glucose, UA 50 (A) NEGATIVE mg/dL   Hgb urine dipstick SMALL (A) NEGATIVE   Bilirubin Urine NEGATIVE NEGATIVE   Ketones, ur 20 (A) NEGATIVE mg/dL   Protein, ur 30 (A) NEGATIVE mg/dL   Nitrite NEGATIVE NEGATIVE   Leukocytes, UA NEGATIVE NEGATIVE   RBC / HPF 0-5 0 - 5 RBC/hpf   WBC, UA 0-5 0 - 5 WBC/hpf   Bacteria, UA NONE SEEN NONE SEEN   Squamous Epithelial / LPF NONE SEEN NONE SEEN   Mucous PRESENT    Hyaline Casts, UA PRESENT   Comprehensive metabolic panel  Result Value Ref Range   Sodium 138 135 - 145 mmol/L   Potassium 3.1 (L) 3.5 - 5.1 mmol/L   Chloride 100 (L) 101 - 111 mmol/L   CO2 22 22 - 32 mmol/L   Glucose, Bld 141 (H) 65 - 99 mg/dL   BUN 49 (H) 6 - 20 mg/dL   Creatinine, Ser 0.82 0.44 - 1.00 mg/dL   Calcium 9.1 8.9 - 10.3 mg/dL   Total Protein 7.9 6.5 - 8.1 g/dL   Albumin 4.0 3.5 - 5.0 g/dL   AST 27 15 - 41 U/L   ALT 19 14 - 54  U/L   Alkaline Phosphatase 100 38 -  126 U/L   Total Bilirubin 1.1 0.3 - 1.2 mg/dL   GFR calc non Af Amer >60 >60 mL/min   GFR calc Af Amer >60 >60 mL/min   Anion gap 16 (H) 5 - 15  Lipase, blood  Result Value Ref Range   Lipase 37 11 - 51 U/L  Troponin I  Result Value Ref Range   Troponin I <0.03 <0.03 ng/mL  Lactic acid, plasma  Result Value Ref Range   Lactic Acid, Venous 2.6 (HH) 0.5 - 1.9 mmol/L  CBC with Differential  Result Value Ref Range   WBC 18.0 (H) 4.0 - 10.5 K/uL   RBC 4.84 3.87 - 5.11 MIL/uL   Hemoglobin 14.5 12.0 - 15.0 g/dL   HCT 41.5 36.0 - 46.0 %   MCV 85.7 78.0 - 100.0 fL   MCH 30.0 26.0 - 34.0 pg   MCHC 34.9 30.0 - 36.0 g/dL   RDW 13.8 11.5 - 15.5 %   Platelets 455 (H) 150 - 400 K/uL   Neutrophils Relative % 85 %   Neutro Abs 15.4 (H) 1.7 - 7.7 K/uL   Lymphocytes Relative 6 %   Lymphs Abs 1.0 0.7 - 4.0 K/uL   Monocytes Relative 9 %   Monocytes Absolute 1.5 (H) 0.1 - 1.0 K/uL   Eosinophils Relative 0 %   Eosinophils Absolute 0.0 0.0 - 0.7 K/uL   Basophils Relative 0 %   Basophils Absolute 0.0 0.0 - 0.1 K/uL  Urine rapid drug screen (hosp performed)  Result Value Ref Range   Opiates NONE DETECTED NONE DETECTED   Cocaine NONE DETECTED NONE DETECTED   Benzodiazepines NONE DETECTED NONE DETECTED   Amphetamines NONE DETECTED NONE DETECTED   Tetrahydrocannabinol NONE DETECTED NONE DETECTED   Barbiturates NONE DETECTED NONE DETECTED  Ethanol  Result Value Ref Range   Alcohol, Ethyl (B) <5 <5 mg/dL   Ct Head Wo Contrast Result Date: 09/14/2016 CLINICAL DATA:  Altered mental status EXAM: CT HEAD WITHOUT CONTRAST TECHNIQUE: Contiguous axial images were obtained from the base of the skull through the vertex without intravenous contrast. COMPARISON:  05/31/2015 FINDINGS: Brain: Global atrophy. Chronic ischemic changes in the periventricular white matter. There is no mass effect, midline shift, or acute hemorrhage. Vascular: No hyperdense vessel or unexpected  calcification. Skull: Normal. Negative for fracture or focal lesion. Sinuses/Orbits: No acute finding. Other: None. IMPRESSION: Global atrophy and chronic ischemic changes. No acute intracranial pathology. Electronically Signed   By: Marybelle Killings M.D.   On: 09/14/2016 13:20   Dg Abd Acute W/chest Result Date: 09/14/2016 CLINICAL DATA:  Nausea, vomiting, confusion, cough EXAM: DG ABDOMEN ACUTE W/ 1V CHEST COMPARISON:  09/02/2016 FINDINGS: Lungs are hyperinflated compatible with background COPD/ emphysema. Chronic apical scarring with calcification. Normal heart size and vascularity. No focal pneumonia, collapse or consolidation. Negative for edema, effusion or pneumothorax. Scoliosis of the spine evident. Thoracic aortic atherosclerosis noted. No free air evident. Unremarkable bowel gas pattern without significant dilatation, ileus or obstruction. Remote cholecystectomy. Air in stool in the rectum. Remote cholecystectomy and previous right hip ORIF noted. Bones are osteopenic. Degenerative changes of the spine and hips. Healed right rami fractures also evident. IMPRESSION: Chronic COPD/ emphysema with mild hyperinflation. No superimposed acute chest process Thoracic aortic atherosclerosis Negative bowel obstruction or free air Electronically Signed   By: Jerilynn Mages.  Shick M.D.   On: 09/14/2016 13:17    1530:  Pt unable to stand for VS without significant assist. Pt continues to have N/V despite IV zofran. Pt's  family states independent living is concerned regarding pt's ability to stay at current level of care and may need a higher level of care.  Question if pt has been taking her PO meds, specifically lopressor, given pt's HR/BP; will dose IV. EPIC chart reviewed: UC with +Ecoli resistant to cipro which was rx by PMD offce last week; will dose IV rocephin after BC obtained. Dx and testing d/w pt and family.  Questions answered.  Verb understanding, agreeable to observation admit. T/C to [facility]. Sheran Fava, case  discussed, including:  HPI, pertinent PM/SHx, VS/PE, dx testing, ED course and treatment:  Agreeable to admit, requests to write temporary orders, obtain stepdown bed to team [facility].   Final Clinical Impressions(s) / ED Diagnoses   Final diagnoses:  None    New Prescriptions New Prescriptions   No medications on file     Francine Graven, DO 09/18/16 1812

## 2016-09-14 NOTE — ED Notes (Signed)
Bed: WA21 Expected date:  Expected time:  Means of arrival:  Comments: EMS-N/V 

## 2016-09-14 NOTE — ED Notes (Signed)
Pt's daughter states that pt often has low potassium and sodium levels and isn't surprised her potassium is low today.

## 2016-09-14 NOTE — ED Triage Notes (Signed)
Per EMS-N/V since Friday of last week-states she has been seen for same on 13 th and by PCP on the 19 th-states GI bug going around nursing facility-no pain, no diarrhea

## 2016-09-14 NOTE — ED Notes (Signed)
Pt placed on bedpan

## 2016-09-14 NOTE — ED Notes (Signed)
Pt was unable to sit and stand without assistance for the orthostatic vitals.  While standing, she was not able to stay standing very long d/t getting tired.  She has also been consistently "spitting up" and it seems like it is d/t coughing but pt states it's d/t being nauseated.

## 2016-09-14 NOTE — ED Notes (Signed)
Unsuccessful on bedpan. Will inform RN and assess another option.

## 2016-09-14 NOTE — ED Notes (Signed)
While getting pt undressed into a gown, there were many little brown ants on pt's body.  She said she has ants in her apt.  She also smelled strongly of old urine and had a urine soaked brief on.  She has been changed and cleaned up.  She has some scratch marks to the back of her RT leg that she doesn't remember doing.  She has a weak cough but consistently cough up clear, thick sputum into the emesis bag.  Pt is axox4.

## 2016-09-14 NOTE — ED Notes (Signed)
Pt's daughter [name] [phone] is going home to rest but would like to be called for questions or updates.

## 2016-09-14 NOTE — ED Notes (Signed)
Pt in radiology 

## 2016-09-14 NOTE — ED Notes (Signed)
Called 4th floor to give report but the bed needs to be approved by their charge RN and will call back when that is done.

## 2016-09-14 NOTE — H&P (Addendum)
History and Physical    Jenny Torres [id number] DOB: Feb 08, 1928 DOA: 09/14/2016  Referring MD/NP/PA:  Francine Graven PCP: Hoyt Koch, MD   Patient coming from: Independent  Chief Complaint:  Nausea and vomiting  HPI: [place] is a 82 y.o. female with Raynaud's disease, mitral valve prolapse, idiopathic peripheral neuropathy, hypertension, hyperlipidemia, GERD, gastritis, COPD, anxiety and depression, and anorexia presents with nausea and vomiting.  The patient states that she has had nausea with intermittent vomiting and heaves for the last 2-3 weeks. She denies diarrhea and states that her stools have been soft brown, possibly mildly darker than usual, and slightly less frequent than usual. Her last bowel movement was yesterday morning. She denies abdominal pains. She denies fevers and chills. She was seen in the emergency department and again by her primary care doctor referred her PCP saw her on 12/19 and diagnosed her with urinary tract infection. He initially placed her on ciprofloxacin but urine culture grew Escherichia coli that was resistant to fluoroquinolones. He changed her antibiotic to [place]. She picked up a new prescription and states she only was able to take 1 or 2 doses but then had such severe nausea and vomiting that for the last few days she has not been able to take any of her medications.  She states that her weight was stable until about 2-3 weeks prior to admission and over the last several weeks she has had worsening nausea leading to anorexia. She states she has lost considerable weight in the last couple of weeks.  Today she had an episode of emesis that was dark brown and was unable to tolerate anything by mouth since she came to the emergency department.  ED Course: Temperature 100 Fahrenheit, pulse 118, respirations 33, blood pressure 203/93, 95% on room air. Labs notable for white blood cell count 18, platelets 455, potassium 3.1. BUN 49 with  a creatinine of 0.82.  Lactic acid 2.6.  Chest x-ray was unremarkable. Head CT negative for acute change. Urinalysis negative.  Blood cultures and urine culture were sent. She was started on ceftriaxone for undertreated urinary tract infection and given IV fluids and is being admitted to the stepdown unit for hypertensive urgency, SIRS.    Review of Systems:  General:  Denies fevers, chills, positive weight loss HEENT:  Denies changes to hearing and vision, rhinorrhea, sinus congestion, sore throat CV:  Denies chest pain and palpitations, lower extremity edema.  PULM:  Denies SOB, wheezing, positive cough.   GI:  positive nausea, vomiting, constipation, diarrhea.   GU:  Positive dysuria, frequency, urgency ENDO:  Denies polyuria, polydipsia.   HEME:  Denies hematemesis, blood in stools, melena, abnormal bruising or bleeding.  LYMPH:  Denies lymphadenopathy.   MSK:  Denies arthralgias, myalgias.   DERM:  Denies skin rash or ulcer.   NEURO:  Denies focal numbness, weakness, slurred speech, confusion, facial droop.  PSYCH:  Denies anxiety and depression.  Denies SI  Past Medical History:  Diagnosis Date  . Anorexia   . Anxiety   . Carotid artery occlusion   . COPD (chronic obstructive pulmonary disease) ([place])   . Depression   . Diverticula, colon   . Gastritis   . GERD (gastroesophageal reflux disease)   . Hemorrhoids, external   . HTN (hypertension)   . Hyperlipidemia   . Hyperlipidemia   . Idiopathic peripheral neuropathy    chronic  . MVP (mitral valve prolapse)    stable  . Osteoporosis   .  Palpitations   . Polyp of colon   . Raynaud's disease     Past Surgical History:  Procedure Laterality Date  . CESAREAN SECTION     x2  . CHOLECYSTECTOMY    . INTRAMEDULLARY (IM) NAIL INTERTROCHANTERIC Right 02/11/2014     reports that she quit smoking about 33 years ago. Her smoking use included Cigarettes. She has a 25.00 pack-year smoking history. She has never used smokeless  tobacco. She reports that she drinks alcohol. She reports that she does not use drugs.  Allergies  Allergen Reactions  . Lovastatin Other (See Comments)    unknown  . Sulfamethoxazole Swelling    Swelling of the throat, mouth, tongue, nose  . Darvocet [Propoxyphene N-Acetaminophen] Itching  . Erythromycin Nausea And Vomiting    Upset stomach  . Hydrocod Polst-Cpm Polst Er Nausea Only    nausea  . Penicillins Other (See Comments)    Has patient had a PCN reaction causing immediate rash, facial/tongue/throat swelling, SOB or lightheadedness with hypotension: No Has patient had a PCN reaction causing severe rash involving mucus membranes or skin necrosis: No Has patient had a PCN reaction that required hospitalization No Has patient had a PCN reaction occurring within the last 10 years: No If all of the above answers are "NO", then may proceed with Cephalosporin use.   . Theophyllines Other (See Comments)    Unknown reaction  . Zovirax [Acyclovir] Other (See Comments)    Per patient lots of side effects  . Ceftin Diarrhea    GI problems  . Paroxetine Hcl Other (See Comments)    jittery    Family History  Problem Relation Age of Onset  . Breast cancer Mother   . Liver disease Brother   . Cirrhosis Brother     Prior to Admission medications   Medication Sig Start Date End Date Taking? Authorizing Provider  amLODipine (NORVASC) 2.5 MG tablet Take 1 tablet (2.5 mg total) by mouth daily. 01/06/16  Yes Hoyt Koch, MD  aspirin 325 MG tablet Take 325 mg by mouth daily.    Yes Historical Provider, MD  ciprofloxacin (CIPRO) 500 MG tablet Take 1 tablet (500 mg total) by mouth 2 (two) times daily. 09/08/16  Yes Golden Circle, FNP  FIBER PO Take 1 capsule by mouth daily.   Yes Historical Provider, MD  gabapentin (NEURONTIN) 800 MG tablet TAKE 1 TABLET TWICE A DAY 08/17/16  Yes Hoyt Koch, MD  metoprolol tartrate (LOPRESSOR) 25 MG tablet TAKE ONE-HALF (1/2) TABLET  TWICE A DAY 08/17/16  Yes Hoyt Koch, MD  sertraline (ZOLOFT) 25 MG tablet Take 1 tablet (25 mg total) by mouth daily. 03/10/16  Yes Hoyt Koch, MD  zolpidem (AMBIEN) 5 MG tablet Take 1 tablet (5 mg total) by mouth at bedtime as needed for sleep. 08/28/16  Yes Hoyt Koch, MD  LORazepam (ATIVAN) 0.5 MG tablet Take 1 tablet (0.5 mg total) by mouth daily as needed for anxiety. 12/27/15   Golden Circle, FNP  nitrofurantoin, macrocrystal-monohydrate, (MACROBID) 100 MG capsule Take 1 capsule (100 mg total) by mouth 2 (two) times daily. 09/10/16   Golden Circle, FNP  ondansetron (ZOFRAN ODT) 8 MG disintegrating tablet 8mg  ODT q8 hours prn nausea 09/02/16   Orlie Dakin, MD  phenazopyridine (PYRIDIUM) 100 MG tablet Take 1 tablet (100 mg total) by mouth 3 (three) times daily as needed for pain. 09/08/16   Golden Circle, FNP    Physical Exam: Vitals:  09/14/16 1325 09/14/16 1433 09/14/16 1500 09/14/16 1600  BP: 192/94 (!) 196/102 (!) 203/93 168/97  Pulse: 105 117 118 88  Resp: 14 22 (!) 33 24  Temp:      TempSrc:      SpO2: 99% 93% 92% 95%   Constitutional: cachectic female, NAD, calm, comfortable.  Coughing and spitting into an emesis bag.  Frothy sputum Eyes: PERRL, lids and conjunctivae normal ENMT: Mucous membranes are moist. Posterior pharynx clear of any exudate or lesions. Poor dentition.  Neck: normal, supple, no masses, no thyromegaly Respiratory: clear to auscultation bilaterally, no wheezing, no crackles. Normal respiratory effort. No accessory muscle use.  Cardiovascular: tachycardic, regular rhythm, no murmurs / rubs / gallops. No extremity edema. 2+ pedal pulses. No carotid bruits.  Abdomen: no tenderness, no masses palpated. No hepatosplenomegaly. Bowel sounds positive.  Musculoskeletal: no clubbing / cyanosis. No joint deformity upper and lower extremities. Good ROM, no contractures. Normal muscle tone.  signficant muscle wasting Skin: no rashes,  lesions, ulcers. No induration Neurologic: CN 2-12 grossly intact. Sensation intact, DTR normal. Strength 5/5 in all 4.  Psychiatric: Normal judgment and insight. Alert and oriented x 3. Normal mood.   Labs on Admission: I have personally reviewed following labs and imaging studies  CBC:  Recent Labs Lab 09/14/16 1258  WBC 18.0*  NEUTROABS 15.4*  HGB 14.5  HCT 41.5  MCV 85.7  PLT [id number]*   Basic Metabolic Panel:  Recent Labs Lab 09/08/16 1111 09/14/16 1258  NA 133* 138  K 4.0 3.1*  CL 95* 100*  CO2 32 22  GLUCOSE 76 141*  BUN 8 49*  CREATININE 0.51 0.82  CALCIUM 9.0 9.1   GFR: Estimated Creatinine Clearance: 30.7 mL/min (by C-G formula based on SCr of 0.82 mg/dL). Liver Function Tests:  Recent Labs Lab 09/14/16 1258  AST 27  ALT 19  ALKPHOS 100  BILITOT 1.1  PROT 7.9  ALBUMIN 4.0    Recent Labs Lab 09/14/16 1258  LIPASE 37   No results for input(s): AMMONIA in the last 168 hours. Coagulation Profile: No results for input(s): INR, PROTIME in the last 168 hours. Cardiac Enzymes:  Recent Labs Lab 09/14/16 1258  TROPONINI <0.03   BNP (last 3 results) No results for input(s): PROBNP in the last 8760 hours. HbA1C: No results for input(s): HGBA1C in the last 72 hours. CBG: No results for input(s): GLUCAP in the last 168 hours. Lipid Profile: No results for input(s): CHOL, HDL, LDLCALC, TRIG, CHOLHDL, LDLDIRECT in the last 72 hours. Thyroid Function Tests: No results for input(s): TSH, T4TOTAL, FREET4, T3FREE, THYROIDAB in the last 72 hours. Anemia Panel: No results for input(s): VITAMINB12, FOLATE, FERRITIN, TIBC, IRON, RETICCTPCT in the last 72 hours. Urine analysis:    Component Value Date/Time   COLORURINE YELLOW 09/14/2016 1420   APPEARANCEUR HAZY (A) 09/14/2016 1420   LABSPEC 1.024 09/14/2016 1420   PHURINE 5.0 09/14/2016 1420   GLUCOSEU 50 (A) 09/14/2016 1420   HGBUR SMALL (A) 09/14/2016 1420   BILIRUBINUR NEGATIVE 09/14/2016 1420    BILIRUBINUR negative 09/08/2016 1044   KETONESUR 20 (A) 09/14/2016 1420   PROTEINUR 30 (A) 09/14/2016 1420   UROBILINOGEN negative 09/08/2016 1044   UROBILINOGEN 1.0 05/31/2015 1600   NITRITE NEGATIVE 09/14/2016 1420   LEUKOCYTESUR NEGATIVE 09/14/2016 1420   Sepsis Labs: @LABRCNTIP (procalcitonin:4,lacticidven:4) ) Recent Results (from the past 240 hour(s))  Urine culture     Status: None   Collection Time: 09/08/16 11:11 AM  Result Value Ref Range Status  Culture ESCHERICHIA COLI  Final   Colony Count Greater than 100,000 CFU/mL  Final   Organism ID, Bacteria ESCHERICHIA COLI  Final      Susceptibility   Escherichia coli -  (no method available)    AMPICILLIN >=32 Resistant     AMOX/CLAVULANIC 16 Intermediate     AMPICILLIN/SULBACTAM 16 Intermediate     PIP/TAZO 8 Sensitive     IMIPENEM <=0.25 Sensitive     CEFAZOLIN <=4 Not Reportable     CEFTRIAXONE <=1 Sensitive     CEFTAZIDIME <=1 Sensitive     CEFEPIME <=1 Sensitive     GENTAMICIN <=1 Sensitive     TOBRAMYCIN <=1 Sensitive     CIPROFLOXACIN >=4 Resistant     LEVOFLOXACIN >=8 Resistant     NITROFURANTOIN <=16 Sensitive     TRIMETH/SULFA* <=20 Sensitive      * NR=NOT REPORTABLE,SEE COMMENTORAL therapy:A cefazolin MIC of <32 predicts susceptibility to the oral agents cefaclor,cefdinir,cefpodoxime,cefprozil,cefuroxime,cephalexin,and loracarbef when used for therapy of uncomplicated UTIs due to E.coli,K.pneumomiae,and P.mirabilis. PARENTERAL therapy: A cefazolinMIC of >8 indicates resistance to parenteralcefazolin. An alternate test method must beperformed to confirm susceptibility to parenteralcefazolin.     Radiological Exams on Admission: Ct Head Wo Contrast  Result Date: 09/14/2016 CLINICAL DATA:  Altered mental status EXAM: CT HEAD WITHOUT CONTRAST TECHNIQUE: Contiguous axial images were obtained from the base of the skull through the vertex without intravenous contrast. COMPARISON:  05/31/2015 FINDINGS: Brain: Global  atrophy. Chronic ischemic changes in the periventricular white matter. There is no mass effect, midline shift, or acute hemorrhage. Vascular: No hyperdense vessel or unexpected calcification. Skull: Normal. Negative for fracture or focal lesion. Sinuses/Orbits: No acute finding. Other: None. IMPRESSION: Global atrophy and chronic ischemic changes. No acute intracranial pathology. Electronically Signed   By: Marybelle Killings M.D.   On: 09/14/2016 13:20   Dg Abd Acute W/chest  Result Date: 09/14/2016 CLINICAL DATA:  Nausea, vomiting, confusion, cough EXAM: DG ABDOMEN ACUTE W/ 1V CHEST COMPARISON:  09/02/2016 FINDINGS: Lungs are hyperinflated compatible with background COPD/ emphysema. Chronic apical scarring with calcification. Normal heart size and vascularity. No focal pneumonia, collapse or consolidation. Negative for edema, effusion or pneumothorax. Scoliosis of the spine evident. Thoracic aortic atherosclerosis noted. No free air evident. Unremarkable bowel gas pattern without significant dilatation, ileus or obstruction. Remote cholecystectomy. Air in stool in the rectum. Remote cholecystectomy and previous right hip ORIF noted. Bones are osteopenic. Degenerative changes of the spine and hips. Healed right rami fractures also evident. IMPRESSION: Chronic COPD/ emphysema with mild hyperinflation. No superimposed acute chest process Thoracic aortic atherosclerosis Negative bowel obstruction or free air Electronically Signed   By: Jerilynn Mages.  Shick M.D.   On: 09/14/2016 13:17    EKG: Independently reviewed. NSR with subtle ST segment depressions in the anterolateral leads  Wt Readings from Last 20 Encounters:  09/08/16 39.5 kg (87 lb)  09/02/16 38.6 kg (85 lb)  01/07/16 40.8 kg (90 lb)  12/13/15 40.9 kg (90 lb 1.9 oz)  10/31/15 41.6 kg (91 lb 12.8 oz)  06/14/15 38.1 kg (84 lb)  05/13/15 43.1 kg (95 lb)  05/08/15 43.1 kg (95 lb)  03/15/15 43.1 kg (95 lb)  12/28/14 41.6 kg (91 lb 12.8 oz)  12/10/14 39.9 kg  (88 lb)  12/02/14 39 kg (86 lb)  11/19/14 40.3 kg (88 lb 12.8 oz)  07/16/14 38.1 kg (84 lb)  03/09/14 38.1 kg (84 lb)  02/18/14 39 kg (86 lb)  02/11/14 39 kg (86 lb)  01/15/14 39.9  kg (88 lb)  08/28/13 39.5 kg (87 lb)  08/07/13 40.8 kg (90 lb)     Assessment/Plan Active Problems:   Intractable nausea and vomiting   SIRS with tachycardia, tachypnea, leukocytosis, likely due to undertreated UTI and dehydrated -  F/u blood culture -  F/u urine culture -  Ceftriaxone  -  Repeat Lactic acid   Nausea and vomiting with possible hematemesis due to possible infection vs. Gastritis/GERD.  Elevated BUN suggestive of upper GIB but so far none documented and history uncertain -  IVF -  Antiemetic -  PPI -  GI cocktail once  HTN/HLD with hypertensive urgency -  IV metoprolol q6h prn until tolerating PO -  Resume home medications -  Admit to stepdown until blood pressures stable  Severe protein calorie malnutrition with underlying lifelong anorexia nervosa.  Daughter states that the anorexia has been a lifelong problem, but worse since the patient's husband died a month ago.  She has had several health care encounters since then for dehydration, nausea/vomiting and has been refusing her home health RN assistance.  Daughter describes mother as "manipulative" and "borderline personality" who seeks help but then refuses assistance or to follow instructions.  Per our records, her weight appears similar to prior.   -  Nutrition consult -  Advance diet to regular as tolerated -  Ensure -  Psychiatry consultation placed -  Check TSH  Neuropathy, stable -  Continue Gabapentin   Depression/anxiety -  Continue ambien and zoloft  Hypokalemia -  Check phos and magnesium -  IV potassium repletion  DVT prophylaxis: lovenox  Code Status: DNR Family Communication: patient and her daughter [name]  Disposition Plan: to stepdown for blood pressure control overnight Consults called: none   Admission status: admission to inpatient.  Due to frailty, anorexia, inability to eat or drink  [name] MD [facility] Pager [phone]  If 7PM-7AM, please contact night-coverage [email or web address] Password [facility]  09/14/2016, 4:29 PM

## 2016-09-14 NOTE — ED Notes (Addendum)
Patient states able to tell when she has to use restroom,instructed patient to inform us when she needs to go. Patient states "I have a UTI." Patient's daughter states that her mother lives in a nice living facility alone, but has nurses that come twice a week. Patient's daughter continues that patient "did not open the door for the nurses" the past few visits, per the nursing staff. Patient's daughter states that patient fell "a few days ago, EMS was called, but she didn't want to be taken anywhere." Daughter states no memory deficits of patient, but has a "psychiatric history" and can be "manupilative"

## 2016-09-15 DIAGNOSIS — F5000 Anorexia nervosa, unspecified: Secondary | ICD-10-CM

## 2016-09-15 DIAGNOSIS — L8990 Pressure ulcer of unspecified site, unspecified stage: Secondary | ICD-10-CM | POA: Insufficient documentation

## 2016-09-15 LAB — TROPONIN I
Troponin I: <0.03 ng/mL (ref <0.03)
Troponin I: <0.03 ng/mL (ref <0.03)

## 2016-09-15 LAB — PHOSPHORUS: PHOSPHORUS: 1.6 mg/dL — AB (ref 2.5–4.6)

## 2016-09-15 LAB — COMPREHENSIVE METABOLIC PANEL
ALK PHOS: 72 U/L (ref 38–126)
ALT: 15 U/L (ref 14–54)
AST: 23 U/L (ref 15–41)
Albumin: 3.2 g/dL — ABNORMAL LOW (ref 3.5–5.0)
Anion gap: 8 (ref 5–15)
BILIRUBIN TOTAL: 0.7 mg/dL (ref 0.3–1.2)
BUN: 25 mg/dL — AB (ref 6–20)
CALCIUM: 8.2 mg/dL — AB (ref 8.9–10.3)
CO2: 23 mmol/L (ref 22–32)
Chloride: 106 mmol/L (ref 101–111)
Creatinine, Ser: 0.5 mg/dL (ref 0.44–1.00)
GFR calc Af Amer: >60 mL/min (ref >60)
Glucose, Bld: 82 mg/dL (ref 65–99)
POTASSIUM: 3.7 mmol/L (ref 3.5–5.1)
Sodium: 137 mmol/L (ref 135–145)
TOTAL PROTEIN: 6.1 g/dL — AB (ref 6.5–8.1)

## 2016-09-15 LAB — CBC
HEMATOCRIT: 35.6 % — AB (ref 36.0–46.0)
Hemoglobin: 12 g/dL (ref 12.0–15.0)
MCH: 29.7 pg (ref 26.0–34.0)
MCHC: 33.7 g/dL (ref 30.0–36.0)
MCV: 88.1 fL (ref 78.0–100.0)
PLATELETS: 286 10*3/uL (ref 150–400)
RBC: 4.04 MIL/uL (ref 3.87–5.11)
RDW: 14.2 % (ref 11.5–15.5)
WBC: 14.2 10*3/uL — AB (ref 4.0–10.5)

## 2016-09-15 LAB — URINE CULTURE: Culture: NO GROWTH

## 2016-09-15 LAB — MAGNESIUM: MAGNESIUM: 2.3 mg/dL (ref 1.7–2.4)

## 2016-09-15 LAB — MRSA PCR SCREENING: MRSA by PCR: POSITIVE — AB

## 2016-09-15 MED ORDER — MUPIROCIN 2 % EX OINT
1.0000 "application " | TOPICAL_OINTMENT | Freq: Two times a day (BID) | CUTANEOUS | Status: DC
  Administered 2016-09-15 – 2016-09-17 (×6): 1 via NASAL
  Filled 2016-09-15 (×2): qty 22

## 2016-09-15 MED ORDER — ENSURE ENLIVE PO LIQD
237.0000 mL | Freq: Three times a day (TID) | ORAL | Status: DC
  Administered 2016-09-16 – 2016-09-17 (×4): 237 mL via ORAL

## 2016-09-15 MED ORDER — GABAPENTIN 400 MG PO CAPS
800.0000 mg | ORAL_CAPSULE | Freq: Two times a day (BID) | ORAL | Status: DC
  Administered 2016-09-15 – 2016-09-17 (×5): 800 mg via ORAL
  Filled 2016-09-15 (×5): qty 2

## 2016-09-15 MED ORDER — K PHOS MONO-SOD PHOS DI & MONO 155-852-130 MG PO TABS
250.0000 mg | ORAL_TABLET | Freq: Three times a day (TID) | ORAL | Status: AC
  Administered 2016-09-15 (×3): 250 mg via ORAL
  Filled 2016-09-15 (×3): qty 1

## 2016-09-15 MED ORDER — ADULT MULTIVITAMIN W/MINERALS CH
1.0000 | ORAL_TABLET | Freq: Every day | ORAL | Status: DC
  Administered 2016-09-15 – 2016-09-17 (×3): 1 via ORAL
  Filled 2016-09-15 (×3): qty 1

## 2016-09-15 MED ORDER — CHLORHEXIDINE GLUCONATE CLOTH 2 % EX PADS
6.0000 | MEDICATED_PAD | Freq: Every day | CUTANEOUS | Status: DC
  Administered 2016-09-15 – 2016-09-16 (×2): 6 via TOPICAL

## 2016-09-15 NOTE — Progress Notes (Signed)
Patient's daughter, [name], spoke to RN about concerns for patient. She states that she feels that the patient is exhibiting attention seeking behaviors. She states that the patient has been treated for dehydration 2-3 times over the last few weeks. She states that EMS has been called 4 times for the patient over the last week. The daughter states that the patient called to the front desk at the [employer] and asked for a glass of water. Because of the facility's policy, they cannot help the residents with things when they live in the independent housing. So they have to call EMS to help them. The daughter states that during the same week, the patient called her to say that "EMS would be at her residence soon but that she was going to tell them that she wouldn't go with them. The daughter asked why EMS was coming and what was going on and the patient replied that she had called the front desk for a glass of water and so she knew that EMS would be coming soon." The patient's daughter states that the patient is not incontinent of urine, but that she arrived to the emergency room in a saturated depends and with ants crawling on her. The patient's daughter states that the patient does not get up and move around at home, but that she just sits around all the time. The patient's daughter is "concerned that her mother is not safely living in the independent living and that she is not actually independent with her needs. The patient's daughter states that the patient will call her and state that she is so sick and doesn't have the strength to do anything, but that the same day she will get dressed up and find a way to go to the doctor to get refills on her Ambien and other medications. She also states that she recently moved into the patient's old home and found a large amount of bottles of antiemetic medications from over the years and that the bottles are all still full." The daughter reports  that the patient "has been refusing to take her nausea medications from the [place] [facility] that she has hired, but then complains of nausea and vomiting." The daughter gave other examples of behaviors that she is concerned about and she is requesting help to better care for her mother. A social work consult was placed to see if they have any resources and the MD was made aware of the conversation. The daughter would like to be contacted regarding this.

## 2016-09-15 NOTE — Progress Notes (Signed)
PROGRESS NOTE  Jenny Torres  [id number] DOB: 1928-01-28 DOA: 09/14/2016 PCP: Hoyt Koch, MD   Brief Narrative: Jenny Torres is a 82 y.o. female with raynaud's disease, mitral valve prolapse, idiopathic peripheral neuropathy, HTN, HLD, GERD, gastritis, COPD, anxiety and depression, and anorexia presented 12/25 with nausea and vomiting.  The patient states that she has had nausea with intermittent vomiting and heaves for the last 2-3 weeks. She denied diarrhea, abdominal pains, fevers and chills. Her PCP saw her on 12/19 and diagnosed her with urinary tract infection. He initially placed her on ciprofloxacin but urine culture grew Escherichia coli that was resistant to fluoroquinolones. He changed her antibiotic to [place]. She picked up a new prescription but only took 1 or 2 doses due to severe nausea and vomiting. In the ED, temp 100F, pulse 118, RR 33, BP 203/93, 95% on room air. Labs notable for WBC 18, BUN 49 with a creatinine of 0.82, lactic acid 2.6. Chest x-ray was unremarkable. Head CT negative for acute change. Urinalysis was negative. Blood cultures and urine culture were sent. She was started on ceftriaxone for undertreated urinary tract infection and given IV fluids. With these interventions, lactate has improved but not returned to normal. Leukocytosis also improved but persistent at 14.2. She had not eaten anything the following morning.  Assessment & Plan: Active Problems:   Intractable nausea and vomiting   SIRS (systemic inflammatory response syndrome) (HCC)   Pressure injury of skin  UTI with SIRS: Undertreated as outpatient. Vital signs have returned to normal, leukocytosis improving.  - Continue ceftriaxone (previous urine culture showed sensitivity to cephalosporins).  - F/u blood cultures sent - F/u urine culture: NGTD - Repeat lactate  Nausea and vomiting: Due to UTI vs. concomitant gastroenteritis vs. gastritis/GERD. Pt endorsed some dark brown  emesis concerning for hematemesis, though no anemia and none since admission (?cause of elevated BUN).  - Continue IVF as diet is started - Continue antiemetic and PPI. - Continue CBC monitoring.   HTN: with hypertensive urgency at admission, since resolved. - Continue home metoprolol with additional IV prn doses if not able to tolerate po. - Stable for telemetry  Severe protein calorie malnutrition with underlying lifelong anorexia nervosa. Per our records, her weight appears similar to prior thus refeeding syndrome is not strongly suspected. TSH wnl. - Nutrition consult - Advance diet to regular as tolerated - Ensure  Neuropathy: Chronic, stable -  Continue gabapentin   Depression/anxiety and grief reaction: Due to husband's recent death. Daughter states that the anorexia has been a lifelong problem, but worse since the patient's husband died a month ago.  She has had several health care encounters since then for dehydration, nausea/vomiting and has been refusing her home health RN assistance.  Daughter describes mother as "manipulative" and "borderline personality" who seeks help but then refuses assistance or to follow instructions.  - Psychiatry initially consulted due to daughter's concern for manipulative behavior, though behavior thus far has remained within normal limits. Doubt inpatient psychiatry needs at this time. Psychiatry may be reconsulted if concern arises over pt's mood/behavior, otherwise will continue home medications. - Continue ambien and zoloft  - Consider adding / changing SSRI to mirtazepine for appetite stimulant properties.  Hypokalemia/hypophosphatemia: Resolved s/p repletion. Mg wnl. - Give Kphos since currently able to take po and recheck in AM - May require more frequent monitoring if levels are labile consistent with refeeding syndrome.  DVT prophylaxis: Lovenox Code Status: DNR Family Communication: None at bedside this  AM Disposition Plan: Return  to ILF once clinically improved. Home health RN and PT have been recommended and ordered.  Consultants:   Psychiatry by phone  Procedures:   None  Antimicrobials:  Ceftriaxone 12/25 >>    Subjective: Pt has not eaten overnight or this morning yet, denies current N/V/D or abdominal pain.  Objective: Vitals:   09/14/16 1730 09/14/16 1817 09/14/16 2031 09/15/16 0551  BP: 184/94 (!) 186/81 (!) 163/94 112/76  Pulse: 99 91 (!) 102 100  Resp: 24 20 20 18   Temp:  98.2 F (36.8 C) 97.9 F (36.6 C) 97.9 F (36.6 C)  TempSrc:  Oral Oral Oral  SpO2: 100% 99% 98% 98%  Weight:  37.2 kg (82 lb)    Height:  5\' 2"  (1.575 m)      Intake/Output Summary (Last 24 hours) at 09/15/16 1404 Last data filed at 09/15/16 [id number]  Gross per 24 hour  Intake          1274.83 ml  Output             1300 ml  Net           -25.17 ml   Filed Weights   09/14/16 1817  Weight: 37.2 kg (82 lb)    Examination: General exam: cachectic but calm elderly female in no distress Respiratory system: Non-labored breathing room air. Clear to auscultation bilaterally.  Cardiovascular system: Regular rate and rhythm. No murmur, rub, or gallop. No JVD, and no pedal edema. Gastrointestinal system: Abdomen soft, non-tender, non-distended, with normoactive bowel sounds. No organomegaly or masses felt. Central nervous system: Alert and oriented. No focal neurological deficits. Extremities: Warm, no deformities Skin: No rashes, lesions or ulcers Psychiatry: Judgement and insight appear normal. Mood & affect appropriate.   Data Reviewed: I have personally reviewed following labs and imaging studies  CBC:  Recent Labs Lab 09/14/16 1258 09/15/16 0645  WBC 18.0* 14.2*  NEUTROABS 15.4*  --   HGB 14.5 12.0  HCT 41.5 35.6*  MCV 85.7 88.1  PLT 455* [id number]   Basic Metabolic Panel:  Recent Labs Lab 09/14/16 1258 09/15/16 0645  NA 138 137  K 3.1* 3.7  CL 100* 106  CO2 22 23  GLUCOSE 141* 82  BUN 49* 25*    CREATININE 0.82 0.50  CALCIUM 9.1 8.2*  MG  --  2.3  PHOS  --  1.6*   GFR: Estimated Creatinine Clearance: 29.6 mL/min (by C-G formula based on SCr of 0.5 mg/dL). Liver Function Tests:  Recent Labs Lab 09/14/16 1258 09/15/16 0645  AST 27 23  ALT 19 15  ALKPHOS 100 72  BILITOT 1.1 0.7  PROT 7.9 6.1*  ALBUMIN 4.0 3.2*    Recent Labs Lab 09/14/16 1258  LIPASE 37   No results for input(s): AMMONIA in the last 168 hours. Coagulation Profile: No results for input(s): INR, PROTIME in the last 168 hours. Cardiac Enzymes:  Recent Labs Lab 09/14/16 1258 09/15/16 0001 09/15/16 0645  TROPONINI <0.03 <0.03 <0.03   BNP (last 3 results) No results for input(s): PROBNP in the last 8760 hours. HbA1C: No results for input(s): HGBA1C in the last 72 hours. CBG: No results for input(s): GLUCAP in the last 168 hours. Lipid Profile: No results for input(s): CHOL, HDL, LDLCALC, TRIG, CHOLHDL, LDLDIRECT in the last 72 hours. Thyroid Function Tests:  Recent Labs  09/14/16 1928  TSH 1.201   Anemia Panel: No results for input(s): VITAMINB12, FOLATE, FERRITIN, TIBC, IRON, RETICCTPCT in the last 72  hours. Urine analysis:    Component Value Date/Time   COLORURINE YELLOW 09/14/2016 1420   APPEARANCEUR HAZY (A) 09/14/2016 1420   LABSPEC 1.024 09/14/2016 1420   PHURINE 5.0 09/14/2016 1420   GLUCOSEU 50 (A) 09/14/2016 1420   HGBUR SMALL (A) 09/14/2016 1420   BILIRUBINUR NEGATIVE 09/14/2016 1420   BILIRUBINUR negative 09/08/2016 1044   KETONESUR 20 (A) 09/14/2016 1420   PROTEINUR 30 (A) 09/14/2016 1420   UROBILINOGEN negative 09/08/2016 1044   UROBILINOGEN 1.0 05/31/2015 1600   NITRITE NEGATIVE 09/14/2016 1420   LEUKOCYTESUR NEGATIVE 09/14/2016 1420   Sepsis Labs: @LABRCNTIP (procalcitonin:4,lacticidven:4)  ) Recent Results (from the past 240 hour(s))  Urine culture     Status: None   Collection Time: 09/08/16 11:11 AM  Result Value Ref Range Status   Culture ESCHERICHIA  COLI  Final   Colony Count Greater than 100,000 CFU/mL  Final   Organism ID, Bacteria ESCHERICHIA COLI  Final      Susceptibility   Escherichia coli -  (no method available)    AMPICILLIN >=32 Resistant     AMOX/CLAVULANIC 16 Intermediate     AMPICILLIN/SULBACTAM 16 Intermediate     PIP/TAZO 8 Sensitive     IMIPENEM <=0.25 Sensitive     CEFAZOLIN <=4 Not Reportable     CEFTRIAXONE <=1 Sensitive     CEFTAZIDIME <=1 Sensitive     CEFEPIME <=1 Sensitive     GENTAMICIN <=1 Sensitive     TOBRAMYCIN <=1 Sensitive     CIPROFLOXACIN >=4 Resistant     LEVOFLOXACIN >=8 Resistant     NITROFURANTOIN <=16 Sensitive     TRIMETH/SULFA* <=20 Sensitive      * NR=NOT REPORTABLE,SEE COMMENTORAL therapy:A cefazolin MIC of <32 predicts susceptibility to the oral agents cefaclor,cefdinir,cefpodoxime,cefprozil,cefuroxime,cephalexin,and loracarbef when used for therapy of uncomplicated UTIs due to E.coli,K.pneumomiae,and P.mirabilis. PARENTERAL therapy: A cefazolinMIC of >8 indicates resistance to parenteralcefazolin. An alternate test method must beperformed to confirm susceptibility to parenteralcefazolin.  MRSA PCR Screening     Status: Abnormal   Collection Time: 09/14/16  9:18 PM  Result Value Ref Range Status   MRSA by PCR POSITIVE (A) NEGATIVE Final    Comment:        The GeneXpert MRSA Assay (FDA approved for NASAL specimens only), is one component of a comprehensive MRSA colonization surveillance program. It is not intended to diagnose MRSA infection nor to guide or monitor treatment for MRSA infections. RESULT CALLED TO, READ BACK BY AND VERIFIED WITHLowry Bowl RN [phone] 09/15/16 A [facility], [place]      Radiology Studies: Ct Head Wo Contrast  Result Date: 09/14/2016 CLINICAL DATA:  Altered mental status EXAM: CT HEAD WITHOUT CONTRAST TECHNIQUE: Contiguous axial images were obtained from the base of the skull through the vertex without intravenous contrast. COMPARISON:  05/31/2015 FINDINGS: Brain:  Global atrophy. Chronic ischemic changes in the periventricular white matter. There is no mass effect, midline shift, or acute hemorrhage. Vascular: No hyperdense vessel or unexpected calcification. Skull: Normal. Negative for fracture or focal lesion. Sinuses/Orbits: No acute finding. Other: None. IMPRESSION: Global atrophy and chronic ischemic changes. No acute intracranial pathology. Electronically Signed   By: Marybelle Killings M.D.   On: 09/14/2016 13:20   Dg Abd Acute W/chest  Result Date: 09/14/2016 CLINICAL DATA:  Nausea, vomiting, confusion, cough EXAM: DG ABDOMEN ACUTE W/ 1V CHEST COMPARISON:  09/02/2016 FINDINGS: Lungs are hyperinflated compatible with background COPD/ emphysema. Chronic apical scarring with calcification. Normal heart size and vascularity. No focal pneumonia, collapse or consolidation.  Negative for edema, effusion or pneumothorax. Scoliosis of the spine evident. Thoracic aortic atherosclerosis noted. No free air evident. Unremarkable bowel gas pattern without significant dilatation, ileus or obstruction. Remote cholecystectomy. Air in stool in the rectum. Remote cholecystectomy and previous right hip ORIF noted. Bones are osteopenic. Degenerative changes of the spine and hips. Healed right rami fractures also evident. IMPRESSION: Chronic COPD/ emphysema with mild hyperinflation. No superimposed acute chest process Thoracic aortic atherosclerosis Negative bowel obstruction or free air Electronically Signed   By: Jerilynn Mages.  Shick M.D.   On: 09/14/2016 13:17    Scheduled Meds: . amLODipine  2.5 mg Oral Daily  . cefTRIAXone (ROCEPHIN)  IV  1 g Intravenous Q24H  . Chlorhexidine Gluconate Cloth  6 each Topical Q0600  . enoxaparin (LOVENOX) injection  30 mg Subcutaneous Q24H  . feeding supplement (ENSURE ENLIVE)  237 mL Oral BID BM  . gabapentin  800 mg Oral BID  . metoprolol tartrate  12.5 mg Oral BID  . mupirocin ointment  1 application Nasal BID  . ondansetron (ZOFRAN) IV  8 mg  Intravenous Q8H  . pantoprazole (PROTONIX) IV  40 mg Intravenous BID  . phenazopyridine  100 mg Oral TID WC  . phosphorus  250 mg Oral TID  . potassium chloride SA  40 mEq Oral Once  . sertraline  25 mg Oral Daily  . sodium chloride flush  3 mL Intravenous Q12H   Continuous Infusions: . dextrose 5 % and 0.45 % NaCl with KCl 20 mEq/L 125 mL/hr at 09/15/16 0609     LOS: 1 day   Time spent: 25 minutes.  Vance Gather, MD [facility] Pager [phone]  If 7PM-7AM, please contact night-coverage [email or web address] Password [email or web address] 09/15/2016, 2:04 PM

## 2016-09-15 NOTE — Progress Notes (Signed)
Initial Nutrition Assessment  DOCUMENTATION CODES:   Underweight, Severe malnutrition in context of social or environmental circumstances  INTERVENTION:   Patient with history of anorexia nervosa. Noted Psych consult.  Monitor magnesium, potassium, and phosphorus daily for at least 3 days, MD to replete as needed, as pt is at risk for refeeding syndrome given severe malnutrition, poor PO intake x 2-3 weeks, and Low Phos levels.  -Provide Ensure Enlive po TID, each supplement provides 350 kcal and 20 grams of protein -Encouraged PO intake of supplements.  -Provide Multivitamin with minerals daily -Recommend 100 mg Thiamine supplementation for at least 3 days given refeeding syndrome risk.  RD to continue to monitor  NUTRITION DIAGNOSIS:   Malnutrition related to social / environmental circumstances as evidenced by percent weight loss, energy intake < or equal to 50% for > or equal to 1 month, severe depletion of body fat, severe depletion of muscle mass.  GOAL:   Patient will meet greater than or equal to 90% of their needs  MONITOR:   PO intake, Supplement acceptance, Labs, Weight trends, Skin, I & O's  REASON FOR ASSESSMENT:   Consult Assessment of nutrition requirement/status  ASSESSMENT:   82 y.o. female with Raynaud's disease, mitral valve prolapse, idiopathic peripheral neuropathy, hypertension, hyperlipidemia, GERD, gastritis, COPD, anxiety and depression, and anorexia presents with nausea and vomiting.  The patient states that she has had nausea with intermittent vomiting and heaves for the last 2-3 weeks. She denies diarrhea and states that her stools have been soft brown, possibly mildly darker than usual, and slightly less frequent than usual. Her last bowel movement was yesterday morning.   Patient in room with daughter at bedside. Pt reports feeling well and attempting to eat her meal trays. Pt states she received oatmeal instead of cream of wheat this morning so  she did not eat anything. Pt's daughter presents with some frustration at the patient and states she "chooses" to not eat to deal with her husband's death. Pt became upset multiple times during RD visit. RD attempted to keep conversation on the topic of nutrition but was difficult when emotions were expressed. Pt reports drinking some Ensure this morning but pt's daughter denies this and states pt has stated in the past she does not like them. Encouraged pt to think of her Ensure as her medication. Pt states she will try.   Pt's daughter states psychiatry has not seen patient. RD notes psych note stating they have not seen patient. Recommend as patient with history of anorexia nervosa.  Patient's daughter states the patient has not eaten for 2 weeks straight. Pt is at risk of refeeding syndrome. Pt has lost 8 lb since 12/19 (6% wt loss x 1 week, significant for time frame). Nutrition-Focused physical exam completed. Findings are severe fat depletion, severe muscle depletion, and no edema.   Labs reviewed: Low Phos Mg/K WNL  Diet Order:  Diet full liquid Room service appropriate? Yes; Fluid consistency: Thin  Skin:  Wound (see comment) (Stage I buttocks pressure injury)  Last BM:  12/25  Height:   Ht Readings from Last 1 Encounters:  09/14/16 5\' 2"  (1.575 m)    Weight:   Wt Readings from Last 1 Encounters:  09/14/16 82 lb (37.2 kg)    Ideal Body Weight:  50 kg  BMI:  Body mass index is 15 kg/m.  Estimated Nutritional Needs:   Kcal:  1200-1400  Protein:  60-70g  Fluid:  1.4-1.5L/day  EDUCATION NEEDS:   Education needs  addressed  Clayton Bibles, MS, RD, LDN Pager: [phone] After Hours Pager: [phone]

## 2016-09-15 NOTE — Evaluation (Signed)
Physical Therapy Evaluation Patient Details Name: Jenny Torres MRN: [id number] DOB: Aug 22, 1928 Today's Date: 09/15/2016   History of Present Illness  This 82 y.o. femaled admitted with nausea and vomiting x 2-3 weeks - recently treated for UTI.  Dx:  SIRS likely due to undertreated UTI an dehydration, HTN with hypertensive urgency, severe protein calorie malnutrition.  PMH includes:  h/o anorexia nervosa, Raynaud's disease, MVP, idiopathic peripheral neuropathy, HTN, hyperlipidemia, GERD, COPD, anxiety and depression.   Clinical Impression  Pt with N & V with increased weakness due to illness which is limiting her ability with all functional mobility. Pt to benefit from continued PT while here, and recommend HHPT after she returns to her apartment in order to increase safety and mobility back to independent baseline.   Encouraged patient to have nursing staff walk with her as well when able.     Follow Up Recommendations      Equipment Recommendations       Recommendations for Other Services       Precautions / Restrictions Precautions Precautions: Fall Precaution Comments: Pt denies falls over the past 6 mos, but reports fall ~ 1 year ago.    Restrictions Weight Bearing Restrictions: No      Mobility  Bed Mobility Overal bed mobility: Independent                Transfers Overall transfer level: Needs assistance Equipment used: Rolling walker (2 wheeled) Transfers: Sit to/from [employer] Transfers Sit to Stand: Supervision Stand pivot transfers: Supervision       General transfer comment: supervision due to mild instability   Ambulation/Gait Ambulation/Gait assistance: Min guard Ambulation Distance (Feet): 80 Feet Assistive device: Rolling walker (2 wheeled) Gait Pattern/deviations: Step-through pattern Gait velocity: slow, steady , but not feeling very strong . Pt reports this is not back to her baseline yet and feels she will need to regain more  strength in order to make it to the dining hall walking       Stairs            Wheelchair Mobility    Modified Rankin (Stroke Patients Only)       Balance Overall balance assessment: Needs assistance Sitting-balance support: Feet supported Sitting balance-Leahy Scale: Good     Standing balance support: No upper extremity supported;During functional activity Standing balance-Leahy Scale: Fair                               Pertinent Vitals/Pain Pain Assessment: No/denies pain    Home Living Family/patient expects to be discharged to:: Other (Comment) (Independent living at [facility] ) Living Arrangements: Alone Available Help at Discharge: Available PRN/intermittently Type of Home: Apartment Home Access: Elevator     Home Layout: One level Home Equipment: Walker - 2 wheels;Shower seat;Grab bars - toilet;Grab bars - tub/shower Additional Comments: independent living at [facility] [employer]     Prior Function Level of Independence: Independent         Comments: Pt reports she ambulates with RW.  She ambulates to dining area ("down hallway, to elevator, then down another hallway"), she prepares bfast.  She reports she manages her own meds      Hand Dominance   Dominant Hand: Right    Extremity/Trunk Assessment   Upper Extremity Assessment Upper Extremity Assessment: Generalized weakness (mildly tremulous )    Lower Extremity Assessment Lower Extremity Assessment: Generalized weakness (weak and garded with standing and  ambulation activities )    Cervical / Trunk Assessment Cervical / Trunk Assessment: Kyphotic  Communication   Communication: No difficulties  Cognition Arousal/Alertness: Awake/alert Behavior During Therapy: WFL for tasks assessed/performed Overall Cognitive Status: Within Functional Limits for tasks assessed                      General Comments      Exercises     Assessment/Plan    PT Assessment    PT  Problem List            PT Treatment Interventions      PT Goals (Current goals can be found in the Care Plan section)  Acute Rehab PT Goals Patient Stated Goal: to go home to her independent living apratment and to never get this issue again!  PT Goal Formulation: With patient Time For Goal Achievement: 09/29/16 Potential to Achieve Goals: Good    Frequency     Barriers to discharge        Co-evaluation               End of Session                 Time: [id number] PT Time Calculation (min) (ACUTE ONLY): 15 min   Charges:   PT Evaluation $PT Eval Low Complexity: 1 Procedure     PT G Codes:        Jenny Torres Sep 28, 2016, 1:30 PM  Jenny Torres, PT Pager: [phone] 09/28/16

## 2016-09-15 NOTE — Evaluation (Signed)
Occupational Therapy Evaluation Patient Details Name: SRUTI DONAT MRN: [id number] DOB: 1928-07-16 Today's Date: 09/15/2016    History of Present Illness This 82 y.o. femaled admitted with nausea and vomiting x 2-3 weeks - recently treated for UTI.  Dx:  SIRS likely due to undertreated UTI an dehydration, HTN with hypertensive urgency, severe protein calorie malnutrition.  PMH includes:  h/o anorexia nervosa, Raynaud's disease, MVP, idiopathic peripheral neuropathy, HTN, hyperlipidemia, GERD, COPD, anxiety and depression.    Clinical Impression   Pt admitted with above. She demonstrates the below listed deficits and will benefit from continued OT to maximize safety and independence with BADLs.  Pt presents to OT with generalized weakness, impaired balance.  She is able to perform ADLs with supervision.  She lives in [place] at [facility], and ambulates with RW to dining area for meals.  Feel she will likely be able to return to independent living, but would recommend [facility] to ensure she is managing medications appropriately.   Will follow acutely.       Follow Up Recommendations  No OT follow up;Other (comment) (HHRN to assess pt's ability to self manage meds )    Equipment Recommendations  None recommended by OT    Recommendations for Other Services       Precautions / Restrictions Precautions Precautions: Fall Precaution Comments: Pt denies falls over the past 6 mos, but reports fall ~ 1 year ago.         Mobility Bed Mobility Overal bed mobility: Independent                Transfers Overall transfer level: Needs assistance   Transfers: Sit to/from Stand;Stand Pivot Transfers Sit to Stand: Supervision Stand pivot transfers: Supervision       General transfer comment: supervision due to mild instability     Balance Overall balance assessment: Needs assistance Sitting-balance support: Feet supported Sitting balance-Leahy Scale: Good      Standing balance support: No upper extremity supported;During functional activity Standing balance-Leahy Scale: Fair                              ADL Overall ADL's : Needs assistance/impaired Eating/Feeding: Independent   Grooming: Wash/dry hands;Wash/dry face;Oral care;Brushing hair;Supervision/safety;Standing   Upper Body Bathing: Set up;Sitting   Lower Body Bathing: Supervison/ safety;Sit to/from stand   Upper Body Dressing : Supervision/safety;Sitting   Lower Body Dressing: Supervision/safety;Sit to/from stand   Toilet Transfer: Ambulation;Comfort height toilet;Grab bars;RW;Min guard Armed forces technical officer Details (indicate cue type and reason): min guard due to low commode  Toileting- Clothing Manipulation and Hygiene: Supervision/safety;Sit to/from stand       Functional mobility during ADLs: Supervision/safety General ADL Comments: Pt mildly unsteady, but does not lose balance - requires increased time to complete tasks      Vision     Perception     Praxis      Pertinent Vitals/Pain Pain Assessment: No/denies pain     Hand Dominance Right   Extremity/Trunk Assessment Upper Extremity Assessment Upper Extremity Assessment: Generalized weakness (mildly tremulous )   Lower Extremity Assessment Lower Extremity Assessment: Defer to PT evaluation   Cervical / Trunk Assessment Cervical / Trunk Assessment: Kyphotic   Communication Communication Communication: No difficulties   Cognition Arousal/Alertness: Awake/alert Behavior During Therapy: WFL for tasks assessed/performed Overall Cognitive Status: Within Functional Limits for tasks assessed  General Comments       Exercises       Shoulder Instructions      Home Living Family/patient expects to be discharged to:: Other (Comment) (Independent living at [facility] ) Living Arrangements: Alone Available Help at Discharge: Available PRN/intermittently Type of  Home: Apartment Home Access: Elevator     Home Layout: One level     Bathroom Shower/Tub: Occupational psychologist: Handicapped height     Home Equipment: Environmental consultant - 2 wheels;Shower seat;Grab bars - toilet;Grab bars - tub/shower   Additional Comments: independent living at [facility]       Prior Functioning/Environment Level of Independence: Independent        Comments: Pt reports she ambulates with RW.  She ambulates to dining area ("down hallway, to elevator, then down another hallway"), she prepares bfast.  She reports she manages her own meds         OT Problem List: Decreased strength;Decreased activity tolerance;Impaired balance (sitting and/or standing);Decreased safety awareness;Decreased knowledge of use of DME or AE   OT Treatment/Interventions: Self-care/ADL training;DME and/or AE instruction;Therapeutic activities;Patient/family education;Balance training    OT Goals(Current goals can be found in the care plan section) Acute Rehab OT Goals Patient Stated Goal: to go home  OT Goal Formulation: With patient Time For Goal Achievement: 09/29/16 Potential to Achieve Goals: Good ADL Goals Pt Will Perform Grooming: with modified independence;standing Pt Will Perform Upper Body Bathing: with modified independence;sitting Pt Will Perform Lower Body Bathing: with modified independence;sit to/from stand Pt Will Perform Upper Body Dressing: with modified independence;sitting Pt Will Perform Lower Body Dressing: with modified independence;sit to/from stand Pt Will Transfer to Toilet: with modified independence;ambulating;regular height toilet;grab bars Pt Will Perform Toileting - Clothing Manipulation and hygiene: with modified independence;sit to/from stand Pt Will Perform Tub/Shower Transfer: Shower transfer;with modified independence;ambulating;shower seat;3 in 1;rolling walker  OT Frequency: Min 2X/week   Barriers to D/C:            Co-evaluation               End of Session Equipment Utilized During Treatment: Surveyor, mining Communication: Mobility status  Activity Tolerance: Patient tolerated treatment well Patient left: in chair;with call bell/phone within reach;with chair alarm set   Time: 1000-1036 OT Time Calculation (min): 36 min Charges:  OT General Charges $OT Visit: 1 Procedure OT Evaluation $OT Eval Low Complexity: 1 Procedure OT Treatments $Self Care/Home Management : 8-22 mins G-Codes:    Mayson Sterbenz M 2016/10/01, 10:56 AM

## 2016-09-15 NOTE — Progress Notes (Signed)
PT Note  Patient Details Name: Jenny Torres MRN: [id number] DOB: 01/06/1928   PT eval completed, full note to follow soon. Pt ambulated in hallway with min guard 80 feet with RW . Patient noticed she is not back to baseline and feeling  a little weak from her illness. Pt agreed with PT that she will be okay to transition back to her apartment but would benefit from HHPT to help her regain her strength in order to return to baseline.   Recommend HHPT.     Clide Dales 09/15/2016, 12:11 PM  Clide Dales, PT Pager: [phone] 09/15/2016

## 2016-09-15 NOTE — Consult Note (Signed)
Brief psychiatry consult note.   Psychiatry is consulted for recommendation regarding evaluation and appropriate disposition for anorexia. Patient continues to have nausea, vomiting and appetite loss since admission. Patient was appropriate this morning and no behavioral issues or safety concern addressed. Discussed with the team that given it is difficult to discern whether her symptoms are secondary to acute UTI, the team would contact psychiatry again if she continues to have appetite loss/concern for anorexia which is refractory to UTI treatment.

## 2016-09-16 DIAGNOSIS — E43 Unspecified severe protein-calorie malnutrition: Secondary | ICD-10-CM

## 2016-09-16 DIAGNOSIS — F3289 Other specified depressive episodes: Secondary | ICD-10-CM

## 2016-09-16 DIAGNOSIS — R6510 Systemic inflammatory response syndrome (SIRS) of non-infectious origin without acute organ dysfunction: Secondary | ICD-10-CM

## 2016-09-16 DIAGNOSIS — L89159 Pressure ulcer of sacral region, unspecified stage: Secondary | ICD-10-CM

## 2016-09-16 DIAGNOSIS — G43A1 Cyclical vomiting, intractable: Secondary | ICD-10-CM

## 2016-09-16 DIAGNOSIS — E872 Acidosis: Secondary | ICD-10-CM

## 2016-09-16 LAB — COMPREHENSIVE METABOLIC PANEL
ALBUMIN: 2.9 g/dL — AB (ref 3.5–5.0)
ALT: 13 U/L — ABNORMAL LOW (ref 14–54)
ANION GAP: 7 (ref 5–15)
AST: 25 U/L (ref 15–41)
Alkaline Phosphatase: 70 U/L (ref 38–126)
BILIRUBIN TOTAL: 0.5 mg/dL (ref 0.3–1.2)
BUN: 9 mg/dL (ref 6–20)
CO2: 24 mmol/L (ref 22–32)
Calcium: 8.1 mg/dL — ABNORMAL LOW (ref 8.9–10.3)
Chloride: 100 mmol/L — ABNORMAL LOW (ref 101–111)
Creatinine, Ser: 0.4 mg/dL — ABNORMAL LOW (ref 0.44–1.00)
GFR calc Af Amer: >60 mL/min (ref >60)
GFR calc non Af Amer: >60 mL/min (ref >60)
GLUCOSE: 81 mg/dL (ref 65–99)
POTASSIUM: 3.8 mmol/L (ref 3.5–5.1)
SODIUM: 131 mmol/L — AB (ref 135–145)
TOTAL PROTEIN: 5.5 g/dL — AB (ref 6.5–8.1)

## 2016-09-16 LAB — CBC
HEMATOCRIT: 34.3 % — AB (ref 36.0–46.0)
HEMOGLOBIN: 11.7 g/dL — AB (ref 12.0–15.0)
MCH: 29.9 pg (ref 26.0–34.0)
MCHC: 34.1 g/dL (ref 30.0–36.0)
MCV: 87.7 fL (ref 78.0–100.0)
Platelets: 256 10*3/uL (ref 150–400)
RBC: 3.91 MIL/uL (ref 3.87–5.11)
RDW: 13.7 % (ref 11.5–15.5)
WBC: 9 10*3/uL (ref 4.0–10.5)

## 2016-09-16 LAB — PHOSPHORUS: PHOSPHORUS: 2.2 mg/dL — AB (ref 2.5–4.6)

## 2016-09-16 LAB — MAGNESIUM: Magnesium: 2 mg/dL (ref 1.7–2.4)

## 2016-09-16 LAB — LACTIC ACID, PLASMA: Lactic Acid, Venous: 2.4 mmol/L (ref 0.5–1.9)

## 2016-09-16 MED ORDER — POTASSIUM PHOSPHATES 15 MMOLE/5ML IV SOLN
20.0000 meq | Freq: Once | INTRAVENOUS | Status: AC
  Administered 2016-09-16: 20 meq via INTRAVENOUS
  Filled 2016-09-16: qty 4.55

## 2016-09-16 MED ORDER — RISPERIDONE 0.5 MG PO TBDP
0.5000 mg | ORAL_TABLET | Freq: Every day | ORAL | Status: DC
  Administered 2016-09-16: 0.5 mg via ORAL
  Filled 2016-09-16: qty 1

## 2016-09-16 MED ORDER — VITAMIN B-1 100 MG PO TABS
100.0000 mg | ORAL_TABLET | Freq: Every day | ORAL | Status: DC
  Administered 2016-09-16 – 2016-09-17 (×2): 100 mg via ORAL
  Filled 2016-09-16: qty 1

## 2016-09-16 MED ORDER — PANTOPRAZOLE SODIUM 40 MG PO TBEC
40.0000 mg | DELAYED_RELEASE_TABLET | Freq: Two times a day (BID) | ORAL | Status: DC
  Administered 2016-09-16 – 2016-09-17 (×3): 40 mg via ORAL
  Filled 2016-09-16 (×3): qty 1

## 2016-09-16 MED ORDER — CEFUROXIME AXETIL 250 MG PO TABS
250.0000 mg | ORAL_TABLET | Freq: Two times a day (BID) | ORAL | Status: DC
  Administered 2016-09-16 – 2016-09-17 (×2): 250 mg via ORAL
  Filled 2016-09-16 (×3): qty 1

## 2016-09-16 MED ORDER — SACCHAROMYCES BOULARDII 250 MG PO CAPS
250.0000 mg | ORAL_CAPSULE | Freq: Two times a day (BID) | ORAL | Status: DC
  Administered 2016-09-16 – 2016-09-17 (×3): 250 mg via ORAL
  Filled 2016-09-16 (×3): qty 1

## 2016-09-16 NOTE — Plan of Care (Signed)
Problem: Education: Goal: Knowledge of [facility] Education information/materials will improve Outcome: Completed/Met Date Met: 09/16/16 PATIENT VERBALIZED UNDERSTANDING

## 2016-09-16 NOTE — Progress Notes (Signed)
PROGRESS NOTE  Jenny Torres  [id number] DOB: 10-16-1927 DOA: 09/14/2016 PCP: Hoyt Koch, MD   Brief Narrative: Jenny Torres is a 82 y.o. female with raynaud's disease, mitral valve prolapse, idiopathic peripheral neuropathy, HTN, HLD, GERD, gastritis, COPD, anxiety and depression, and anorexia presented 12/25 with nausea and vomiting.  The patient states that she has had nausea with intermittent vomiting and heaves for the last 2-3 weeks. She denied diarrhea, abdominal pains, fevers and chills. Her PCP saw her on 12/19 and diagnosed her with urinary tract infection. He initially placed her on ciprofloxacin but urine culture grew Escherichia coli that was resistant to fluoroquinolones. He changed her antibiotic to [place]. She picked up a new prescription but only took 1 or 2 doses due to severe nausea and vomiting. In the ED, temp 100F, pulse 118, RR 33, BP 203/93, 95% on room air. Labs notable for WBC 18, BUN 49 with a creatinine of 0.82, lactic acid 2.6. Chest x-ray was unremarkable. Head CT negative for acute change. Urinalysis was negative. Blood cultures and urine culture were sent. She was started on ceftriaxone for undertreated urinary tract infection and given IV fluids. With these interventions, lactate has improved but not returned to normal. Leukocytosis also improved but persistent at 14.2. She had not eaten anything the following morning.  Assessment & Plan: Active Problems:   Intractable nausea and vomiting   SIRS (systemic inflammatory response syndrome) (HCC)   Pressure injury of skin  UTI with SIRS: Undertreated as outpatient. Vital signs have returned to normal, leukocytosis improving.  - Continue ceftriaxone (previous urine culture showed sensitivity to cephalosporins).  - F/u blood cultures sent - F/u urine culture: NGTD - Repeat lactate elevated, will continue IVF's and follow again in am  Nausea and vomiting: Due to UTI vs. concomitant gastroenteritis  vs. gastritis/GERD. Pt endorsed some dark brown emesis, which will give concerns for hematemesis, though no anemia and no episdes since admission (?cause of elevated BUN).  - Continue IVF as diet is started - Continue antiemetic and PPI. - Continue CBC monitoring.   HTN: with hypertensive urgency at admission, since then resolved. - Continue home metoprolol with additional IV prn doses if not able to tolerate po. - Stable for telemetry  Severe protein calorie malnutrition with underlying lifelong anorexia nervosa. Per our records, her weight appears similar to prior thus refeeding syndrome is not strongly suspected. TSH wnl. - Nutrition consult appreciated - Advance diet to regular as tolerated - continue Ensure  Neuropathy: Chronic, stable -  Continue gabapentin   Depression/anxiety and grief reaction: Due to husband's recent death. Daughter states that the anorexia has been a lifelong problem, but worse since the patient's husband died 2 month ago.  She has had several health care encounters since then for dehydration, nausea/vomiting and has been refusing her home health RN assistance.  Daughter describes mother as "manipulative" and "borderline personality" who seeks help but then refuses assistance or to follow instructions.  - Psychiatry initially consulted due to daughter's concern for manipulative behavior, though behavior thus far has remained within normal limits. Doubt inpatient psychiatry needs at this time. -case discussed over the phone with Psychiatry -will continue Continue zoloft and add low dose risperdal  Hypokalemia/hypophosphatemia:  -will continue repletion as needed  and close monitoring   Pressure injury -sacral area, almost stage 1 -will continue preventions and constant repositioning   DVT prophylaxis: Lovenox Code Status: DNR Family Communication: None at bedside this AM Disposition Plan: Return to ILF once  clinically improved. Home health RN and PT have  been recommended and ordered.  Consultants:   Psychiatry by phone  Procedures:   None  Antimicrobials:  Ceftriaxone 12/25 >> 12/27  cefting 12/27  Subjective: Pt denies abd pain. Reports some nausea, but not vomiting. Per nursing staff positive diarrhea.   Objective: Vitals:   09/16/16 0525 09/16/16 1100 09/16/16 1400 09/16/16 2103  BP: (!) 166/71 (!) 160/65 139/65 (!) 161/75  Pulse: 70  75 74  Resp: 18  18 14   Temp: 97.5 F (36.4 C)  98 F (36.7 C) 97.5 F (36.4 C)  TempSrc: Oral  Oral Axillary  SpO2: 100%  100% 100%  Weight:      Height:        Intake/Output Summary (Last 24 hours) at 09/16/16 2158 Last data filed at 09/16/16 1800  Gross per 24 hour  Intake             3338 ml  Output             1600 ml  Net             1738 ml   Filed Weights   09/14/16 1817  Weight: 37.2 kg (82 lb)    Examination: General exam: cachectic, frail and chronically ill in appearance, in no acute distress. Reporting some nausea, but no vomiting. No eating much. Respiratory system: Non-labored breathing room air. Clear to auscultation bilaterally.  Cardiovascular system: Regular rate and rhythm. No murmur, rub, or gallop. No JVD, and no pedal edema. Gastrointestinal system: Abdomen soft, non-tender, non-distended, with normoactive bowel sounds. No organomegaly or masses felt. Central nervous system: Alert and oriented. No focal neurological deficits. Extremities: Warm, no deformities Skin: No rashes, lesions or ulcers Psychiatry: Judgement and insight appear normal. Mood & affect appropriate.   Data Reviewed: I have personally reviewed following labs and imaging studies  CBC:  Recent Labs Lab 09/14/16 1258 09/15/16 0645 09/16/16 0540  WBC 18.0* 14.2* 9.0  NEUTROABS 15.4*  --   --   HGB 14.5 12.0 11.7*  HCT 41.5 35.6* 34.3*  MCV 85.7 88.1 87.7  PLT 455* 286 [id number]   Basic Metabolic Panel:  Recent Labs Lab 09/14/16 1258 09/15/16 0645 09/16/16 0540  NA 138 137  131*  K 3.1* 3.7 3.8  CL 100* 106 100*  CO2 22 23 24   GLUCOSE 141* 82 81  BUN 49* 25* 9  CREATININE 0.82 0.50 0.40*  CALCIUM 9.1 8.2* 8.1*  MG  --  2.3 2.0  PHOS  --  1.6* 2.2*   GFR: Estimated Creatinine Clearance: 29.6 mL/min (by C-G formula based on SCr of 0.4 mg/dL (L)). Liver Function Tests:  Recent Labs Lab 09/14/16 1258 09/15/16 0645 09/16/16 0540  AST 27 23 25   ALT 19 15 13*  ALKPHOS 100 72 70  BILITOT 1.1 0.7 0.5  PROT 7.9 6.1* 5.5*  ALBUMIN 4.0 3.2* 2.9*    Recent Labs Lab 09/14/16 1258  LIPASE 37   No results for input(s): AMMONIA in the last 168 hours. Coagulation Profile: No results for input(s): INR, PROTIME in the last 168 hours. Cardiac Enzymes:  Recent Labs Lab 09/14/16 1258 09/15/16 0001 09/15/16 0645  TROPONINI <0.03 <0.03 <0.03   BNP (last 3 results) No results for input(s): PROBNP in the last 8760 hours. HbA1C: No results for input(s): HGBA1C in the last 72 hours. CBG: No results for input(s): GLUCAP in the last 168 hours. Lipid Profile: No results for input(s): CHOL, HDL, LDLCALC, TRIG,  CHOLHDL, LDLDIRECT in the last 72 hours. Thyroid Function Tests:  Recent Labs  09/14/16 1928  TSH 1.201   Anemia Panel: No results for input(s): VITAMINB12, FOLATE, FERRITIN, TIBC, IRON, RETICCTPCT in the last 72 hours. Urine analysis:    Component Value Date/Time   COLORURINE YELLOW 09/14/2016 1420   APPEARANCEUR HAZY (A) 09/14/2016 1420   LABSPEC 1.024 09/14/2016 1420   PHURINE 5.0 09/14/2016 1420   GLUCOSEU 50 (A) 09/14/2016 1420   HGBUR SMALL (A) 09/14/2016 1420   BILIRUBINUR NEGATIVE 09/14/2016 1420   BILIRUBINUR negative 09/08/2016 1044   KETONESUR 20 (A) 09/14/2016 1420   PROTEINUR 30 (A) 09/14/2016 1420   UROBILINOGEN negative 09/08/2016 1044   UROBILINOGEN 1.0 05/31/2015 1600   NITRITE NEGATIVE 09/14/2016 1420   LEUKOCYTESUR NEGATIVE 09/14/2016 1420   Sepsis Labs: @LABRCNTIP (procalcitonin:4,lacticidven:4)  ) Recent  Results (from the past 240 hour(s))  Urine culture     Status: None   Collection Time: 09/08/16 11:11 AM  Result Value Ref Range Status   Culture ESCHERICHIA COLI  Final   Colony Count Greater than 100,000 CFU/mL  Final   Organism ID, Bacteria ESCHERICHIA COLI  Final      Susceptibility   Escherichia coli -  (no method available)    AMPICILLIN >=32 Resistant     AMOX/CLAVULANIC 16 Intermediate     AMPICILLIN/SULBACTAM 16 Intermediate     PIP/TAZO 8 Sensitive     IMIPENEM <=0.25 Sensitive     CEFAZOLIN <=4 Not Reportable     CEFTRIAXONE <=1 Sensitive     CEFTAZIDIME <=1 Sensitive     CEFEPIME <=1 Sensitive     GENTAMICIN <=1 Sensitive     TOBRAMYCIN <=1 Sensitive     CIPROFLOXACIN >=4 Resistant     LEVOFLOXACIN >=8 Resistant     NITROFURANTOIN <=16 Sensitive     TRIMETH/SULFA* <=20 Sensitive      * NR=NOT REPORTABLE,SEE COMMENTORAL therapy:A cefazolin MIC of <32 predicts susceptibility to the oral agents cefaclor,cefdinir,cefpodoxime,cefprozil,cefuroxime,cephalexin,and loracarbef when used for therapy of uncomplicated UTIs due to E.coli,K.pneumomiae,and P.mirabilis. PARENTERAL therapy: A cefazolinMIC of >8 indicates resistance to parenteralcefazolin. An alternate test method must beperformed to confirm susceptibility to parenteralcefazolin.  Culture, blood (routine x 2)     Status: None (Preliminary result)   Collection Time: 09/14/16 12:55 PM  Result Value Ref Range Status   Specimen Description BLOOD BLOOD LEFT FOREARM  Final   Special Requests IN PEDIATRIC BOTTLE 3CC  Final   Culture   Final    NO GROWTH 2 DAYS Performed at [facility]    Report Status PENDING  Incomplete  Urine culture     Status: None   Collection Time: 09/14/16  2:20 PM  Result Value Ref Range Status   Specimen Description URINE, RANDOM  Final   Special Requests NONE  Final   Culture NO GROWTH Performed at [facility]   Final   Report Status 09/15/2016 FINAL  Final  Culture, blood  (routine x 2)     Status: None (Preliminary result)   Collection Time: 09/14/16  3:52 PM  Result Value Ref Range Status   Specimen Description BLOOD RIGHT ANTECUBITAL  Final   Special Requests BOTTLES DRAWN AEROBIC AND ANAEROBIC 5CC  Final   Culture   Final    NO GROWTH 2 DAYS Performed at [facility]    Report Status PENDING  Incomplete  MRSA PCR Screening     Status: Abnormal   Collection Time: 09/14/16  9:18 PM  Result  Value Ref Range Status   MRSA by PCR POSITIVE (A) NEGATIVE Final    Comment:        The GeneXpert MRSA Assay (FDA approved for NASAL specimens only), is one component of a comprehensive MRSA colonization surveillance program. It is not intended to diagnose MRSA infection nor to guide or monitor treatment for MRSA infections. RESULT CALLED TO, READ BACK BY AND VERIFIED WITHLowry Bowl RN [phone] 09/15/16 A [facility]      Radiology Studies: No results found.  Scheduled Meds: . amLODipine  2.5 mg Oral Daily  . cefUROXime  250 mg Oral BID WC  . Chlorhexidine Gluconate Cloth  6 each Topical Q0600  . enoxaparin (LOVENOX) injection  30 mg Subcutaneous Q24H  . feeding supplement (ENSURE ENLIVE)  237 mL Oral TID BM  . gabapentin  800 mg Oral BID  . metoprolol tartrate  12.5 mg Oral BID  . multivitamin with minerals  1 tablet Oral Daily  . mupirocin ointment  1 application Nasal BID  . ondansetron (ZOFRAN) IV  8 mg Intravenous Q8H  . pantoprazole  40 mg Oral BID  . phenazopyridine  100 mg Oral TID WC  . potassium chloride SA  40 mEq Oral Once  . saccharomyces boulardii  250 mg Oral BID  . sertraline  25 mg Oral Daily  . sodium chloride flush  3 mL Intravenous Q12H  . thiamine  100 mg Oral Daily   Continuous Infusions: . dextrose 5 % and 0.45 % NaCl with KCl 20 mEq/L 125 mL/hr at 09/16/16 1124     LOS: 2 days   Time spent: 25 minutes.  Barton Dubois, MD [facility] Pager [phone]  If 7PM-7AM, please contact  night-coverage [email or web address] Password [facility] 09/16/2016, 9:58 PM

## 2016-09-16 NOTE — Progress Notes (Signed)
Patients daughter and granddaughter expressed concerns regarding patients discharge plan. Both report pt has been unable to care for herself in independent living facility. Per family pt will not complete any adls. Will speak to SW regarding concerns.

## 2016-09-16 NOTE — Progress Notes (Signed)
Pharmacy IV to PO conversion  The patient is receiving Pantoprazole by the intravenous route.  Based on criteria approved by the Pharmacy and [place], the medication is being converted to the equivalent oral dose form.   No active GI bleeding or impaired absorption  Not s/p esophagectomy  Documented ability to take oral medications for > 24 hr  Plan to continue treatment for at least 1 day  If you have any questions about this conversion, please contact the Pharmacy Department (ext [phone]).  Thank you.  Reuel Boom, PharmD Pager: [phone] 09/16/2016, 8:00 AM

## 2016-09-16 NOTE — Progress Notes (Signed)
CRITICAL VALUE ALERT  Critical value received:  Lactic Acid 2.4  Date of notification:  09/16/16   Time of notification:  0625  Critical value read back:Yes.    Nurse who received alert:  Erling Conte, RN  MD notified (1st page):  Baltazar Najjar  Time of first page:  [phone]  MD notified (2nd page): N/A  Time of second page: N/A  Responding MD:  N/A  Time MD responded: N/A

## 2016-09-16 NOTE — Care Management Note (Addendum)
Case Management Note  Patient Details  Name: Jenny Torres MRN: [id number] Date of Birth: 1928-07-08  Subjective/Objective:                  Nausea and vomiting Action/Plan: Discharge planning Expected Discharge Date:                  Expected Discharge Plan:  [name]  In-House Referral:     Discharge planning Services  CM Consult  Post Acute Care Choice:  Home Health Choice offered to:  NA  DME Arranged:  N/A DME Agency:  NA  HH Arranged:  PT, RN [place] Agency:  Other - See comment  Status of Service:  In process, will continue to follow  If discussed at Long Length of Stay Meetings, dates discussed:    Additional Comments: CM notes pt is from independent living at [name] which uses [facility] for their home health services.  Cm met with pt to confirm [facility] and HHPT/RN services; pt agreed.  Pt states she does not need a 3n1 and has a rolling walker at home. CM called [facility] [phone] and spoke with Corey Skains who requests I fax orders, face to face, facesheet, and H&P to same number.  Jada requests the DC summary when/if available upon discharge.  CM faxed requested information and will follow to fax DC summary when/if available.   Dellie Catholic, RN 09/16/2016, 11:57 AM

## 2016-09-17 DIAGNOSIS — E860 Dehydration: Secondary | ICD-10-CM

## 2016-09-17 DIAGNOSIS — R112 Nausea with vomiting, unspecified: Secondary | ICD-10-CM

## 2016-09-17 DIAGNOSIS — F329 Major depressive disorder, single episode, unspecified: Secondary | ICD-10-CM

## 2016-09-17 DIAGNOSIS — I10 Essential (primary) hypertension: Secondary | ICD-10-CM

## 2016-09-17 DIAGNOSIS — R5381 Other malaise: Secondary | ICD-10-CM

## 2016-09-17 DIAGNOSIS — N390 Urinary tract infection, site not specified: Principal | ICD-10-CM

## 2016-09-17 LAB — COMPREHENSIVE METABOLIC PANEL
ALBUMIN: 2.7 g/dL — AB (ref 3.5–5.0)
ALT: 12 U/L — AB (ref 14–54)
AST: 19 U/L (ref 15–41)
Alkaline Phosphatase: 63 U/L (ref 38–126)
Anion gap: 8 (ref 5–15)
CHLORIDE: 103 mmol/L (ref 101–111)
CO2: 26 mmol/L (ref 22–32)
CREATININE: 0.48 mg/dL (ref 0.44–1.00)
Calcium: 8.4 mg/dL — ABNORMAL LOW (ref 8.9–10.3)
GFR calc Af Amer: >60 mL/min (ref >60)
GLUCOSE: 100 mg/dL — AB (ref 65–99)
Potassium: 4.5 mmol/L (ref 3.5–5.1)
SODIUM: 137 mmol/L (ref 135–145)
Total Bilirubin: 0.8 mg/dL (ref 0.3–1.2)
Total Protein: 4.9 g/dL — ABNORMAL LOW (ref 6.5–8.1)

## 2016-09-17 LAB — CBC
HEMATOCRIT: 30.2 % — AB (ref 36.0–46.0)
Hemoglobin: 10.3 g/dL — ABNORMAL LOW (ref 12.0–15.0)
MCH: 29.7 pg (ref 26.0–34.0)
MCHC: 34.1 g/dL (ref 30.0–36.0)
MCV: 87 fL (ref 78.0–100.0)
PLATELETS: 244 10*3/uL (ref 150–400)
RBC: 3.47 MIL/uL — ABNORMAL LOW (ref 3.87–5.11)
RDW: 13.6 % (ref 11.5–15.5)
WBC: 7.3 10*3/uL (ref 4.0–10.5)

## 2016-09-17 LAB — PHOSPHORUS: Phosphorus: 3.1 mg/dL (ref 2.5–4.6)

## 2016-09-17 LAB — LACTIC ACID, PLASMA: Lactic Acid, Venous: 1.4 mmol/L (ref 0.5–1.9)

## 2016-09-17 LAB — MAGNESIUM: Magnesium: 2 mg/dL (ref 1.7–2.4)

## 2016-09-17 MED ORDER — SACCHAROMYCES BOULARDII 250 MG PO CAPS: 250.0000 mg | ORAL_CAPSULE | Freq: Two times a day (BID) | ORAL | 0 refills | Status: DC

## 2016-09-17 MED ORDER — PANTOPRAZOLE SODIUM 40 MG PO TBEC: 40.0000 mg | DELAYED_RELEASE_TABLET | Freq: Two times a day (BID) | ORAL | 1 refills | Status: DC

## 2016-09-17 MED ORDER — RISPERIDONE 0.5 MG PO TBDP: 0.5000 mg | ORAL_TABLET | Freq: Every day | ORAL | 1 refills | Status: DC

## 2016-09-17 MED ORDER — CEFUROXIME AXETIL 250 MG PO TABS: 250.0000 mg | ORAL_TABLET | Freq: Two times a day (BID) | ORAL | 0 refills | Status: DC

## 2016-09-17 MED ORDER — ENSURE ENLIVE PO LIQD: 237.0000 mL | Freq: Three times a day (TID) | ORAL | 5 refills | Status: DC

## 2016-09-17 NOTE — Progress Notes (Signed)
Completed D/C teaching. Gave prescriptions. Answered questions. Patient will be transported back to [facility] via [place]. Patient is aware

## 2016-09-17 NOTE — Progress Notes (Signed)
Occupational Therapy Treatment Patient Details Name: Jenny Torres MRN: [id number] DOB: [birth year] Today's Date: 09/17/2016    History of present illness This 82 y.o. femaled admitted with nausea and vomiting x 2-3 weeks - recently treated for UTI.  Dx:  SIRS likely due to undertreated UTI an dehydration, HTN with hypertensive urgency, severe protein calorie malnutrition.  PMH includes:  h/o anorexia nervosa, Raynaud's disease, MVP, idiopathic peripheral neuropathy, HTN, hyperlipidemia, GERD, COPD, anxiety and depression.    OT comments  Pt performs ADLs with supervision standing at sink.   Continue to follow acutely.   Follow Up Recommendations  No OT follow up;Other (comment) (recommend [facility] )    Equipment Recommendations  None recommended by OT    Recommendations for Other Services      Precautions / Restrictions Precautions Precautions: Fall Restrictions Weight Bearing Restrictions: No       Mobility Bed Mobility Overal bed mobility: Independent                Transfers Overall transfer level: Needs assistance Equipment used: Rolling walker (2 wheeled) Transfers: Sit to/from [facility] Sit to Stand: Supervision Stand pivot transfers: Supervision       General transfer comment: supervision due to instability     Balance Overall balance assessment: Needs assistance Sitting-balance support: Feet supported Sitting balance-Leahy Scale: Good     Standing balance support: No upper extremity supported;During functional activity Standing balance-Leahy Scale: Fair                     ADL Overall ADL's : Needs assistance/impaired     Grooming: Wash/dry hands;Wash/dry face;Oral care;Supervision/safety;Standing   Upper Body Bathing: Supervision/ safety;Standing   Lower Body Bathing: Supervison/ safety;Sit to/from stand   Upper Body Dressing : Set up;Sitting   Lower Body Dressing: Supervision/safety;Sit to/from stand   Toilet  Transfer: Supervision/safety;Comfort height toilet;RW;Grab bars   Toileting- Clothing Manipulation and Hygiene: Supervision/safety;Sit to/from stand       Functional mobility during ADLs: Supervision/safety General ADL Comments: Pt stood at sink to perform ADLs       Vision                     Perception     Praxis      Cognition   Behavior During Therapy: [facility] for tasks assessed/performed Overall Cognitive Status: Within Functional Limits for tasks assessed                       Extremity/Trunk Assessment               Exercises     Shoulder Instructions       General Comments      Pertinent Vitals/ Pain       Pain Assessment: No/denies pain  Home Living                                          Prior Functioning/Environment              Frequency  Min 2X/week        Progress Toward Goals  OT Goals(current goals can now be found in the care plan section)  Progress towards OT goals: Progressing toward goals     Plan Discharge plan remains appropriate    Co-evaluation  End of Session Equipment Utilized During Treatment: Rolling walker   Activity Tolerance Patient tolerated treatment well   Patient Left in chair;with call bell/phone within reach;with chair alarm set   Nurse Communication Mobility status        Time: [id number] OT Time Calculation (min): 42 min  Charges:    Lucille Passy M 09/17/2016, 12:41 PM

## 2016-09-17 NOTE — Progress Notes (Signed)
Per [employer], Cm has faxed DC summary and latest add on of SW to [facility] services.  CM has arranged for [facility] for non-emergency transport home.  No other CM needs were communicated.

## 2016-09-17 NOTE — Progress Notes (Signed)
Physical Therapy Treatment Patient Details Name: Jenny Torres MRN: [id number] DOB: 01/16/1928 Today's Date: 09/17/2016    History of Present Illness This 82 y.o. femaled admitted with nausea and vomiting x 2-3 weeks - recently treated for UTI.  Dx:  SIRS likely due to undertreated UTI an dehydration, HTN with hypertensive urgency, severe protein calorie malnutrition.  PMH includes:  h/o anorexia nervosa, Raynaud's disease, MVP, idiopathic peripheral neuropathy, HTN, hyperlipidemia, GERD, COPD, anxiety and depression.     PT Comments    Progressing with mobility. Pt reports she may d/c home today.   Follow Up Recommendations  Home health PT     Equipment Recommendations  None recommended by PT    Recommendations for Other Services       Precautions / Restrictions Precautions Precautions: Fall Restrictions Weight Bearing Restrictions: No    Mobility  Bed Mobility Overal bed mobility: Independent             General bed mobility comments: oob in restroom  Transfers Overall transfer level: Needs assistance Equipment used: Rolling walker (2 wheeled) Transfers: Sit to/from [facility] Sit to Stand: Supervision Stand pivot transfers: Supervision       General transfer comment: for safety  Ambulation/Gait Ambulation/Gait assistance: Min guard Ambulation Distance (Feet): 100 Feet Assistive device: Rolling walker (2 wheeled) Gait Pattern/deviations: Step-through pattern;Trunk flexed;Decreased stride length     General Gait Details: close guard for safety. slow gait speed. Pt tolerated distance well.    Stairs            Wheelchair Mobility    Modified Rankin (Stroke Patients Only)       Balance Overall balance assessment: Needs assistance Sitting-balance support: Feet supported Sitting balance-Leahy Scale: Good     Standing balance support: Bilateral upper extremity supported Standing balance-Leahy Scale: Poor                      Cognition Arousal/Alertness: Awake/alert Behavior During Therapy: WFL for tasks assessed/performed Overall Cognitive Status: Within Functional Limits for tasks assessed                      Exercises      General Comments        Pertinent Vitals/Pain Pain Assessment: No/denies pain    Home Living                      Prior Function            PT Goals (current goals can now be found in the care plan section) Progress towards PT goals: Progressing toward goals    Frequency    Min 3X/week      PT Plan Current plan remains appropriate    Co-evaluation             End of Session Equipment Utilized During Treatment: Gait belt Activity Tolerance: Patient tolerated treatment well Patient left: in chair;with call bell/phone within reach;with chair alarm set     Time: [id number] PT Time Calculation (min) (ACUTE ONLY): 11 min  Charges:  $Gait Training: 8-22 mins                    G Codes:      Weston Anna, MPT Pager: [phone]

## 2016-09-17 NOTE — Discharge Summary (Signed)
Physician Discharge Summary  DESTENE BARTEN [id number] DOB: 08-15-1928 DOA: 09/14/2016  PCP: Hoyt Koch, MD  Admit date: 09/14/2016 Discharge date: 09/17/2016  Time spent: 35 minutes  Recommendations for Outpatient Follow-up:  1. Please repeat BMET, Mg and Phosphorus level to follow electrolytes trend  2. Repeat CBC to follow Hgb trend  3. Assure resolution of her UTI symptoms  4. Follow patient oral intake 5. Follow response to initiation of Risperdal and arrange outpatient follow up with psychiatry service    Discharge Diagnoses:  Active Problems:   Intractable vomiting with nausea   SIRS (systemic inflammatory response syndrome) (HCC)   Pressure injury of skin   Dehydration   Urinary tract infection without hematuria   Physical deconditioning Depression  Protein calorie malnutrition, severe   Discharge Condition: stable and improved. Discharge back to independent living facility with [facility] services (PT, RN and SW).  Diet recommendation: heart healthy diet   Filed Weights   09/14/16 1817  Weight: 37.2 kg (82 lb)    History of present illness:  As per Dr. Sheran Fava H&P written on 09/14/16 82 y.o. female with Raynaud's disease, mitral valve prolapse, idiopathic peripheral neuropathy, hypertension, hyperlipidemia, GERD, gastritis, COPD, anxiety and depression, and anorexia presents with nausea and vomiting.  The patient states that she has had nausea with intermittent vomiting and heaves for the last 2-3 weeks. She denies diarrhea and states that her stools have been soft brown, possibly mildly darker than usual, and slightly less frequent than usual. Her last bowel movement was yesterday morning. She denies abdominal pains. She denies fevers and chills. She was seen in the emergency department and again by her primary care doctor referred her PCP saw her on 12/19 and diagnosed her with urinary tract infection. He initially placed her on ciprofloxacin but urine culture  grew Escherichia coli that was resistant to fluoroquinolones. He changed her antibiotic to [place]. She picked up a new prescription and states she only was able to take 1 or 2 doses but then had such severe nausea and vomiting that for the last few days she has not been able to take any of her medications.  She states that her weight was stable until about 2-3 weeks prior to admission and over the last several weeks she has had worsening nausea leading to anorexia. She states she has lost considerable weight in the last couple of weeks.  Today she had an episode of emesis that was dark brown and was unable to tolerate anything by mouth since she came to the emergency department.  Hospital Course:  UTI with SIRS: Undertreated as outpatient.  -Vital signs have returned to normal, leukocytosis resolved.  -discharge on Ceftin to complete antibiotic therapy -Blood cultures sent and urine culture: NGTD -Repeat lactic acid prior to discharge WNL -patient advise to maintain good hydration   Nausea and vomiting: Due to UTI vs. concomitant gastroenteritis vs. gastritis/GERD. Pt endorsed some dark brown emesis, which will give concerns for hematemesis, though no anemia and no episdes since admission. -Continue antiemetic and PPI. -Continue CBC monitoring.   HTN: with hypertensive urgency at admission, since then resolved. -Continue home metoprolol  -Watch sodium intake   Severe protein calorie malnutrition with underlyinglifelong anorexia nervosa.Per our records, her weight appears similar to prior thus refeeding syndrome is not strongly suspected. TSH wnl. -Nutrition consult appreciated -Advance diet to regular as tolerated -Continue Ensure TID  Neuropathy: Chronic, stable -Continue gabapentin   Depression/anxiety and grief reaction: Due to husband's recent death. Daughter  states that the anorexia has been a lifelong problem, but worse since the patient's husband died 2 month ago. She has  had several health care encounters since then for dehydration, nausea/vomiting and has been refusing her home health RN assistance. Daughter describes mother as "manipulative" and "borderline personality" who seeks help but then refuses assistance or to follow instructions.  -Psychiatry initially consulted due to daughter's concern for manipulative behavior, though behavior thus far has remained within normal limits. Doubt inpatient psychiatry needs at this time.  -case discussed over the phone with Psychiatry -will continue Continue zoloft and add low dose risperdal -patient will benefit of outpatient psychiatry follow up  Hypokalemia/hypophosphatemia:  -will recommend repletion as needed  and close monitoring  -all electrolytes WNL at discharge -patient encourage to improve PO nutrition and use of feeding supplements   Pressure injury -sacral area, almost stage 1 -will continue preventions and constant repositioning   Physical deconditioning  -discharge with HHRN, HHPT and SW  Procedures:  See below to x-ray reports   Consultations:  Psych by Phone   Discharge Exam: Vitals:   09/16/16 2103 09/17/16 0455  BP: (!) 161/75 (!) 154/73  Pulse: 74 81  Resp: 14 13  Temp: 97.5 F (36.4 C) 98.4 F (36.9 C)   General exam: cachectic, frail and chronically ill in appearance, in no acute distress. Reporting improvement in PO intake and being hungry today. No further nausea or vomiting.  Respiratory system: Non-labored breathing room air. Clear to auscultation bilaterally.  Cardiovascular system: Regular rate and rhythm. No murmur, rub, or gallop. No JVD, and no pedal edema. Gastrointestinal system: Abdomen soft, non-tender, non-distended, with normoactive bowel sounds. No organomegaly or masses felt. Central nervous system: Alert and oriented. No focal neurological deficits. Extremities: Warm, no deformities Skin: No rashes, lesions or ulcers Psychiatry: Judgement and insight  appear normal. Mood & affect appropriate.     Discharge Instructions   Discharge Instructions    Discharge instructions    Complete by:  As directed    Take medications as prescribed Please maintain adequate hydration Assure good nutritional intake  Follow up with PCP in 10 days     Current Discharge Medication List    START taking these medications   Details  cefUROXime (CEFTIN) 250 MG tablet Take 1 tablet (250 mg total) by mouth 2 (two) times daily with a meal. Qty: 6 tablet, Refills: 0    feeding supplement, ENSURE ENLIVE, (ENSURE ENLIVE) LIQD Take 237 mLs by mouth 3 (three) times daily between meals. Qty: 14220 mL, Refills: 5    pantoprazole (PROTONIX) 40 MG tablet Take 1 tablet (40 mg total) by mouth 2 (two) times daily. Qty: 60 tablet, Refills: 1    risperiDONE (RISPERDAL M-TABS) 0.5 MG disintegrating tablet Take 1 tablet (0.5 mg total) by mouth at bedtime. Qty: 30 tablet, Refills: 1    saccharomyces boulardii (FLORASTOR) 250 MG capsule Take 1 capsule (250 mg total) by mouth 2 (two) times daily. Qty: 60 capsule, Refills: 0      CONTINUE these medications which have NOT CHANGED   Details  amLODipine (NORVASC) 2.5 MG tablet Take 1 tablet (2.5 mg total) by mouth daily. Qty: 90 tablet, Refills: 3    aspirin 325 MG tablet Take 325 mg by mouth daily.     FIBER PO Take 1 capsule by mouth daily.    gabapentin (NEURONTIN) 800 MG tablet TAKE 1 TABLET TWICE A DAY Qty: 180 tablet, Refills: 1   Associated Diagnoses: Idiopathic progressive neuropathy  metoprolol tartrate (LOPRESSOR) 25 MG tablet TAKE ONE-HALF (1/2) TABLET TWICE A DAY Qty: 90 tablet, Refills: 1    sertraline (ZOLOFT) 25 MG tablet Take 1 tablet (25 mg total) by mouth daily. Qty: 90 tablet, Refills: 1    ondansetron (ZOFRAN ODT) 8 MG disintegrating tablet 8mg  ODT q8 hours prn nausea Qty: 12 tablet, Refills: 0      STOP taking these medications     ciprofloxacin (CIPRO) 500 MG tablet       zolpidem (AMBIEN) 5 MG tablet      LORazepam (ATIVAN) 0.5 MG tablet      nitrofurantoin, macrocrystal-monohydrate, (MACROBID) 100 MG capsule      phenazopyridine (PYRIDIUM) 100 MG tablet        Allergies  Allergen Reactions  . Lovastatin Other (See Comments)    unknown  . Sulfamethoxazole Swelling    Swelling of the throat, mouth, tongue, nose  . Darvocet [Propoxyphene N-Acetaminophen] Itching  . Erythromycin Nausea And Vomiting    Upset stomach  . Hydrocod Polst-Cpm Polst Er Nausea Only    nausea  . Penicillins Other (See Comments)    Has patient had a PCN reaction causing immediate rash, facial/tongue/throat swelling, SOB or lightheadedness with hypotension: No Has patient had a PCN reaction causing severe rash involving mucus membranes or skin necrosis: No Has patient had a PCN reaction that required hospitalization No Has patient had a PCN reaction occurring within the last 10 years: No If all of the above answers are "NO", then may proceed with Cephalosporin use.   . Theophyllines Other (See Comments)    Unknown reaction  . Zovirax [Acyclovir] Other (See Comments)    Per patient lots of side effects  . Ceftin Diarrhea    GI problems  . Paroxetine Hcl Other (See Comments)    jittery   Follow-up Information    Hoyt Koch, MD. Schedule an appointment as soon as possible for a visit in 10 day(s).   Specialty:  Internal Medicine Contact information: [place] [address] [phone]            The results of significant diagnostics from this hospitalization (including imaging, microbiology, ancillary and laboratory) are listed below for reference.    Significant Diagnostic Studies: Ct Head Wo Contrast  Result Date: 09/14/2016 CLINICAL DATA:  Altered mental status EXAM: CT HEAD WITHOUT CONTRAST TECHNIQUE: Contiguous axial images were obtained from the base of the skull through the vertex without intravenous contrast. COMPARISON:   05/31/2015 FINDINGS: Brain: Global atrophy. Chronic ischemic changes in the periventricular white matter. There is no mass effect, midline shift, or acute hemorrhage. Vascular: No hyperdense vessel or unexpected calcification. Skull: Normal. Negative for fracture or focal lesion. Sinuses/Orbits: No acute finding. Other: None. IMPRESSION: Global atrophy and chronic ischemic changes. No acute intracranial pathology. Electronically Signed   By: Marybelle Killings M.D.   On: 09/14/2016 13:20   Dg Abd Acute W/chest  Result Date: 09/14/2016 CLINICAL DATA:  Nausea, vomiting, confusion, cough EXAM: DG ABDOMEN ACUTE W/ 1V CHEST COMPARISON:  09/02/2016 FINDINGS: Lungs are hyperinflated compatible with background COPD/ emphysema. Chronic apical scarring with calcification. Normal heart size and vascularity. No focal pneumonia, collapse or consolidation. Negative for edema, effusion or pneumothorax. Scoliosis of the spine evident. Thoracic aortic atherosclerosis noted. No free air evident. Unremarkable bowel gas pattern without significant dilatation, ileus or obstruction. Remote cholecystectomy. Air in stool in the rectum. Remote cholecystectomy and previous right hip ORIF noted. Bones are osteopenic. Degenerative changes of  the spine and hips. Healed right rami fractures also evident. IMPRESSION: Chronic COPD/ emphysema with mild hyperinflation. No superimposed acute chest process Thoracic aortic atherosclerosis Negative bowel obstruction or free air Electronically Signed   By: Jerilynn Mages.  Shick M.D.   On: 09/14/2016 13:17   Dg Abd Acute W/chest  Result Date: 09/02/2016 CLINICAL DATA:  Weakness, nausea EXAM: DG ABDOMEN ACUTE W/ 1V CHEST COMPARISON:  None. FINDINGS: The lungs are hyperinflated likely secondary to COPD. There is no focal consolidation, pleural effusion or pneumothorax. The heart and mediastinal contours are unremarkable. The osseous structures are unremarkable. There is no bowel dilatation to suggest obstruction.  There are no air-fluid levels. There is no evidence of pneumoperitoneum, portal venous gas, or pneumatosis. There is dextroscoliosis of the thoracolumbar spine. There is moderate osteoarthritis of the right hip. There is mild osteoarthritis of the left hip. There is old posttraumatic deformity of the right superior and inferior pubic rami. IMPRESSION: No acute abdominal pathology. No active cardiopulmonary disease. Electronically Signed   By: Kathreen Devoid   On: 09/02/2016 18:59    Microbiology: Recent Results (from the past 240 hour(s))  Urine culture     Status: None   Collection Time: 09/08/16 11:11 AM  Result Value Ref Range Status   Culture ESCHERICHIA COLI  Final   Colony Count Greater than 100,000 CFU/mL  Final   Organism ID, Bacteria ESCHERICHIA COLI  Final      Susceptibility   Escherichia coli -  (no method available)    AMPICILLIN >=32 Resistant     AMOX/CLAVULANIC 16 Intermediate     AMPICILLIN/SULBACTAM 16 Intermediate     PIP/TAZO 8 Sensitive     IMIPENEM <=0.25 Sensitive     CEFAZOLIN <=4 Not Reportable     CEFTRIAXONE <=1 Sensitive     CEFTAZIDIME <=1 Sensitive     CEFEPIME <=1 Sensitive     GENTAMICIN <=1 Sensitive     TOBRAMYCIN <=1 Sensitive     CIPROFLOXACIN >=4 Resistant     LEVOFLOXACIN >=8 Resistant     NITROFURANTOIN <=16 Sensitive     TRIMETH/SULFA* <=20 Sensitive      * NR=NOT REPORTABLE,SEE COMMENTORAL therapy:A cefazolin MIC of <32 predicts susceptibility to the oral agents cefaclor,cefdinir,cefpodoxime,cefprozil,cefuroxime,cephalexin,and loracarbef when used for therapy of uncomplicated UTIs due to E.coli,K.pneumomiae,and P.mirabilis. PARENTERAL therapy: A cefazolinMIC of >8 indicates resistance to parenteralcefazolin. An alternate test method must beperformed to confirm susceptibility to parenteralcefazolin.  Culture, blood (routine x 2)     Status: None (Preliminary result)   Collection Time: 09/14/16 12:55 PM  Result Value Ref Range Status   Specimen  Description BLOOD BLOOD LEFT FOREARM  Final   Special Requests IN PEDIATRIC BOTTLE 3CC  Final   Culture   Final    NO GROWTH 3 DAYS Performed at [facility]    Report Status PENDING  Incomplete  Urine culture     Status: None   Collection Time: 09/14/16  2:20 PM  Result Value Ref Range Status   Specimen Description URINE, RANDOM  Final   Special Requests NONE  Final   Culture NO GROWTH Performed at [facility]   Final   Report Status 09/15/2016 FINAL  Final  Culture, blood (routine x 2)     Status: None (Preliminary result)   Collection Time: 09/14/16  3:52 PM  Result Value Ref Range Status   Specimen Description BLOOD RIGHT ANTECUBITAL  Final   Special Requests BOTTLES DRAWN AEROBIC AND ANAEROBIC 5CC  Final  Culture   Final    NO GROWTH 3 DAYS Performed at [facility]    Report Status PENDING  Incomplete  MRSA PCR Screening     Status: Abnormal   Collection Time: 09/14/16  9:18 PM  Result Value Ref Range Status   MRSA by PCR POSITIVE (A) NEGATIVE Final    Comment:        The GeneXpert MRSA Assay (FDA approved for NASAL specimens only), is one component of a comprehensive MRSA colonization surveillance program. It is not intended to diagnose MRSA infection nor to guide or monitor treatment for MRSA infections. RESULT CALLED TO, READ BACK BY AND VERIFIED WITH: I EDWARDS RN [id number] 09/15/16 A NAVARRO      Labs: Basic Metabolic Panel:  Recent Labs Lab 09/14/16 1258 09/15/16 0645 09/16/16 0540 09/17/16 0446  NA 138 137 131* 137  K 3.1* 3.7 3.8 4.5  CL 100* 106 100* 103  CO2 22 23 24 26   GLUCOSE 141* 82 81 100*  BUN 49* 25* 9 <5*  CREATININE 0.82 0.50 0.40* 0.48  CALCIUM 9.1 8.2* 8.1* 8.4*  MG  --  2.3 2.0 2.0  PHOS  --  1.6* 2.2* 3.1   Liver Function Tests:  Recent Labs Lab 09/14/16 1258 09/15/16 0645 09/16/16 0540 09/17/16 0446  AST 27 23 25 19   ALT 19 15 13* 12*  ALKPHOS 100 72 70 63  BILITOT 1.1 0.7 0.5 0.8  PROT 7.9  6.1* 5.5* 4.9*  ALBUMIN 4.0 3.2* 2.9* 2.7*    Recent Labs Lab 09/14/16 1258  LIPASE 37   No results for input(s): AMMONIA in the last 168 hours. CBC:  Recent Labs Lab 09/14/16 1258 09/15/16 0645 09/16/16 0540 09/17/16 0446  WBC 18.0* 14.2* 9.0 7.3  NEUTROABS 15.4*  --   --   --   HGB 14.5 12.0 11.7* 10.3*  HCT 41.5 35.6* 34.3* 30.2*  MCV 85.7 88.1 87.7 87.0  PLT 455* 286 256 244   Cardiac Enzymes:  Recent Labs Lab 09/14/16 1258 09/15/16 0001 09/15/16 0645  TROPONINI <0.03 <0.03 <0.03   BNP: BNP (last 3 results) No results for input(s): BNP in the last 8760 hours.  ProBNP (last 3 results) No results for input(s): PROBNP in the last 8760 hours.  CBG: No results for input(s): GLUCAP in the last 168 hours.     Signed:  Barton Dubois MD.  [facility] 09/17/2016, 1:43 PM

## 2016-09-18 ENCOUNTER — Telehealth: Payer: Self-pay

## 2016-09-18 NOTE — Telephone Encounter (Signed)
Transition Care Management Follow-up Telephone Call   Date discharged? 09/17/2016   How have you been since you were released from the hospital? Yes   Do you understand why you were in the hospital? Yes   Do you understand the discharge instructions? Yes   Where were you discharged to? Home  Items Reviewed:  Medications reviewed: Yes  Allergies reviewed: Yes  Dietary changes reviewed: N/A  Referrals reviewed: Yes  Functional Questionnaire:   Activities of Daily Living (ADLs):   States they are independent in the following: All ADL's States they require assistance with the following: N/A  Any transportation issues/concerns? No  Any patient concerns? No  Confirmed importance and date/time of follow-up visits scheduled: Yes  Provider Appointment booked with PCP on Friday 09/25/2016  Confirmed with patient if condition begins to worsen call PCP or go to the ER.  Patient was given the office number and encouraged to call back with question or concerns: Yes

## 2016-09-19 LAB — CULTURE, BLOOD (ROUTINE X 2)
CULTURE: NO GROWTH
Culture: NO GROWTH

## 2016-09-22 ENCOUNTER — Telehealth: Payer: Self-pay | Admitting: Emergency Medicine

## 2016-09-22 NOTE — Telephone Encounter (Signed)
[facility] called and wants to know if they can get verbal orders to go out  and see patients for GI and medication compliant. 1 wk 1, 2 wk 5 and 3 PRN's. Please advise thanks.

## 2016-09-23 ENCOUNTER — Telehealth: Payer: Self-pay | Admitting: Internal Medicine

## 2016-09-23 NOTE — Telephone Encounter (Signed)
Patient has been evaluated for home health for 2 a week for 5 weeks.  Requesting verbals.

## 2016-09-23 NOTE — Telephone Encounter (Signed)
Called Verdis Frederickson and gave verbal ok for home health 2 week 5

## 2016-09-23 NOTE — Telephone Encounter (Signed)
Contacted Pam to give verbal ok for Home care.

## 2016-09-25 ENCOUNTER — Inpatient Hospital Stay: Payer: Medicare Other | Admitting: Internal Medicine

## 2016-09-28 ENCOUNTER — Ambulatory Visit (INDEPENDENT_AMBULATORY_CARE_PROVIDER_SITE_OTHER): Payer: Medicare Other | Admitting: Internal Medicine

## 2016-09-28 ENCOUNTER — Encounter: Payer: Self-pay | Admitting: Internal Medicine

## 2016-09-28 DIAGNOSIS — J449 Chronic obstructive pulmonary disease, unspecified: Secondary | ICD-10-CM

## 2016-09-28 DIAGNOSIS — R5381 Other malaise: Secondary | ICD-10-CM

## 2016-09-28 DIAGNOSIS — F329 Major depressive disorder, single episode, unspecified: Secondary | ICD-10-CM | POA: Diagnosis not present

## 2016-09-28 DIAGNOSIS — R300 Dysuria: Secondary | ICD-10-CM | POA: Diagnosis not present

## 2016-09-28 DIAGNOSIS — E43 Unspecified severe protein-calorie malnutrition: Secondary | ICD-10-CM | POA: Diagnosis not present

## 2016-09-28 DIAGNOSIS — F32A Depression, unspecified: Secondary | ICD-10-CM

## 2016-09-28 MED ORDER — SERTRALINE HCL 25 MG PO TABS: 25.0000 mg | ORAL_TABLET | Freq: Every day | ORAL | 3 refills | Status: DC

## 2016-09-28 NOTE — Progress Notes (Signed)
   Subjective:    Patient ID: Jenny Torres, female    DOB: [birth year], 83 y.o.   MRN: [id number]  HPI The patient is an 83 YO female coming in for hospital follow up (she was in with dehydration, calorie malnutrition, UTI and nausea and vomiting). She was treated with antibiotics and is done with those now. She was not able to fill the medication from the hospital as they gave her paper prescriptions for everything despite the fact that she asked them to call in as she could not get to the pharmacy. She took an old course of antibiotics instead for the urinary infection and does feel better. She denies fevers or chills since being home. No more nausea or vomiting. She was also started on risperdal in the hospital and has not been taking this for the same reason. Denies falls since being home. No chest pains, SOB, diarrhea. Some weakness still. She has poor appetite still but is working on eating well at meals.   PMH, [facility], social history reviewed and updated.   Review of Systems  Constitutional: Positive for activity change, appetite change and fatigue. Negative for chills, fever and unexpected weight change.  HENT: Negative.   Eyes: Negative.   Respiratory: Negative for cough, chest tightness, shortness of breath and wheezing.   Cardiovascular: Negative for chest pain, palpitations and leg swelling.  Gastrointestinal: Negative for abdominal distention, abdominal pain, constipation, nausea and vomiting.  Musculoskeletal: Positive for gait problem. Negative for arthralgias and back pain.  Skin: Negative.   Neurological: Positive for weakness. Negative for dizziness, facial asymmetry, light-headedness, numbness and headaches.  Psychiatric/Behavioral: Negative.       Objective:   Physical Exam  Constitutional: She is oriented to person, place, and time. She appears well-developed.  skinny  HENT:  Head: Normocephalic and atraumatic.  Eyes: EOM are normal.  Neck: Normal range of motion.    Cardiovascular: Normal rate and regular rhythm.   Pulmonary/Chest: Effort normal and breath sounds normal.  Abdominal: Soft. Bowel sounds are normal. She exhibits no distension. There is no tenderness. There is no rebound.  Neurological: She is alert and oriented to person, place, and time. Coordination abnormal.  Walker for ambulation  Skin: Skin is warm and dry.   Vitals:   09/28/16 1341  BP: 118/70  Pulse: 68  SpO2: 98%  Weight: 86 lb (39 kg)  Height: 5\' 2"  (1.575 m)      Assessment & Plan:

## 2016-09-28 NOTE — Patient Instructions (Signed)
We do not need blood work today.   Keep working with the physical therapist to get back some of the strength.

## 2016-09-28 NOTE — Assessment & Plan Note (Signed)
She is working on eating more at meals. She did not get the ensure due to physical scripts and she has no access to get to a pharmacy.

## 2016-09-28 NOTE — Assessment & Plan Note (Signed)
No flare today and not current smoker.

## 2016-09-28 NOTE — Assessment & Plan Note (Signed)
She did not take the antibiotic since she was given a physical script she could not get to a pharmacy. It is unclear why this was not sent in electronically since none of the prescriptions were controlled. She did take an old prescription and is feeling well at this time. Unable to give urine specimen today.

## 2016-09-28 NOTE — Progress Notes (Signed)
Pre visit review using our clinic review tool, if applicable. No additional management support is needed unless otherwise documented below in the visit note. 

## 2016-09-28 NOTE — Assessment & Plan Note (Addendum)
zoloft refilled today for her depression. She did not start the risperdal due to prescription not sent to her pharmacy and she does not want to start. Since it is unclear whether this was helping will not resume at this time.

## 2016-09-28 NOTE — Assessment & Plan Note (Signed)
Getting PT for several more weeks and she will rejoin the exercise at her ALF when able. Using walker for stability.

## 2016-10-28 DIAGNOSIS — I10 Essential (primary) hypertension: Secondary | ICD-10-CM | POA: Diagnosis not present

## 2016-10-28 DIAGNOSIS — J449 Chronic obstructive pulmonary disease, unspecified: Secondary | ICD-10-CM

## 2016-10-28 DIAGNOSIS — F5000 Anorexia nervosa, unspecified: Secondary | ICD-10-CM | POA: Diagnosis not present

## 2016-10-28 DIAGNOSIS — E43 Unspecified severe protein-calorie malnutrition: Secondary | ICD-10-CM | POA: Diagnosis not present

## 2016-10-28 DIAGNOSIS — R112 Nausea with vomiting, unspecified: Secondary | ICD-10-CM | POA: Diagnosis not present

## 2016-11-19 ENCOUNTER — Ambulatory Visit: Payer: Medicare Other

## 2016-11-25 ENCOUNTER — Telehealth: Payer: Self-pay | Admitting: *Deleted

## 2016-11-25 MED ORDER — ZOLPIDEM TARTRATE 5 MG PO TABS: 5.0000 mg | ORAL_TABLET | Freq: Every day | ORAL | 0 refills | Status: DC

## 2016-11-25 NOTE — Telephone Encounter (Signed)
Printed and signed.  

## 2016-11-25 NOTE — Telephone Encounter (Signed)
Rec'd fax pt requesting refill on her zolpidem 5 mg [facility]...[name]

## 2016-11-25 NOTE — Telephone Encounter (Signed)
[facility]...Jenny Torres

## 2016-12-22 ENCOUNTER — Ambulatory Visit: Payer: Medicare Other

## 2016-12-31 ENCOUNTER — Other Ambulatory Visit: Payer: Self-pay | Admitting: Internal Medicine

## 2017-01-04 NOTE — Progress Notes (Signed)
Subjective:   Jenny Torres is a 83 y.o. female who presents for an Initial Medicare Annual Wellness Visit.  Review of Systems    No ROS.  Medicare Wellness Visit.  Cardiac Risk Factors include: advanced age (>31men, >40 women);dyslipidemia;hypertension Sleep patterns:  Patient reports long history of insomnia,  gets up 1-2 times nightly to void and sleeps 4-5 hours nightly.  Discussed recommended sleep tips and stress reduction tips, education attached to patient's AVS and insomnia education booklet given.   Home Safety/Smoke Alarms: Feels safe in home. Smoke alarms in place.  Living environment; residence and Adult nurse: assisted living, equipment: Walkers, Type: Conservation officer, nature, no firearms. Seat Belt Safety/Bike Helmet: Wears seat belt.   Counseling:   Eye Exam- appointment yearly, Dr. Katy Torres, has an upcoming appointment Dental- Patient reports she has an dentist but has difficulty affording visits, affordable resources provided  Female:   Pap- N/A       Mammo-  Last 01/14/16, BI-RADS category: 1 recommend repeat in 1 year    Dexa scan- Patient declines referral at this time       CCS- N/A, due to age     Objective:    Today's Vitals   01/05/17 1330  BP: 138/62  Pulse: (!) 56  Resp: 20  SpO2: 97%  Weight: 89 lb (40.4 kg)  Height: 5\' 2"  (1.575 m)   Body mass index is 16.28 kg/m.   Current Medications (verified) Outpatient Encounter Prescriptions as of 01/05/2017  Medication Sig  . amLODipine (NORVASC) 2.5 MG tablet TAKE 1 TABLET DAILY  . aspirin 325 MG tablet Take 325 mg by mouth daily.   . feeding supplement, ENSURE ENLIVE, (ENSURE ENLIVE) LIQD Take 237 mLs by mouth 3 (three) times daily between meals.  Marland Kitchen FIBER PO Take 1 capsule by mouth daily.  Marland Kitchen gabapentin (NEURONTIN) 800 MG tablet TAKE 1 TABLET TWICE A DAY  . metoprolol tartrate (LOPRESSOR) 25 MG tablet TAKE ONE-HALF (1/2) TABLET TWICE A DAY  . ondansetron (ZOFRAN ODT) 8 MG disintegrating tablet 8mg  ODT  q8 hours prn nausea  . sertraline (ZOLOFT) 25 MG tablet Take 1 tablet (25 mg total) by mouth daily.  Marland Kitchen zolpidem (AMBIEN) 5 MG tablet Take 1 tablet (5 mg total) by mouth at bedtime.  . [DISCONTINUED] pantoprazole (PROTONIX) 40 MG tablet Take 1 tablet (40 mg total) by mouth 2 (two) times daily. (Patient not taking: Reported on 01/05/2017)  . [DISCONTINUED] saccharomyces boulardii (FLORASTOR) 250 MG capsule Take 1 capsule (250 mg total) by mouth 2 (two) times daily. (Patient not taking: Reported on 01/05/2017)   No facility-administered encounter medications on file as of 01/05/2017.     Allergies (verified) Lovastatin; Sulfamethoxazole; Darvocet [propoxyphene n-acetaminophen]; Erythromycin; Hydrocod polst-cpm polst er; Penicillins; Theophyllines; Zovirax [acyclovir]; Ceftin; and Paroxetine hcl   History: Past Medical History:  Diagnosis Date  . Anorexia   . Anxiety   . Carotid artery occlusion   . COPD (chronic obstructive pulmonary disease) ([place])   . Depression   . Diverticula, colon   . Gastritis   . GERD (gastroesophageal reflux disease)   . Hemorrhoids, external   . HTN (hypertension)   . Hyperlipidemia   . Hyperlipidemia   . Idiopathic peripheral neuropathy    chronic  . MVP (mitral valve prolapse)    stable  . Osteoporosis   . Palpitations   . Polyp of colon   . Raynaud's disease    Past Surgical History:  Procedure Laterality Date  . CESAREAN SECTION  x2  . CHOLECYSTECTOMY    . INTRAMEDULLARY (IM) NAIL INTERTROCHANTERIC Right 02/11/2014   Family History  Problem Relation Age of Onset  . Breast cancer Mother   . Liver disease Brother   . Cirrhosis Brother    Social History   Occupational History  . caregiver Retired   Social History Main Topics  . Smoking status: Former Smoker    Packs/day: 1.00    Years: 25.00    Types: Cigarettes    Quit date: 09/22/1983  . Smokeless tobacco: Never Used  . Alcohol use Yes     Comment: rarely  . Drug use: No  . Sexual  activity: No    Tobacco Counseling Counseling given: Not Answered   Activities of Daily Living In your present state of health, do you have any difficulty performing the following activities: 01/05/2017 09/14/2016  Hearing? Y N  Vision? Y N  Difficulty concentrating or making decisions? N Y  Walking or climbing stairs? Y Y  Dressing or bathing? N Y  Doing errands, shopping? N Y  Conservation officer, nature and eating ? Y -  Using the Toilet? N -  In the past six months, have you accidently leaked urine? N -  Do you have problems with loss of bowel control? N -  Managing your Medications? N -  Managing your Finances? N -  Housekeeping or managing your Housekeeping? Y -  Some recent data might be hidden    Immunizations and Health Maintenance Immunization History  Administered Date(s) Administered  . Influenza Split 06/23/2012  . Influenza Whole 07/08/2011  . Influenza-Unspecified 06/21/2013, 07/06/2015  . Pneumococcal Conjugate-13 11/10/2015  . Pneumococcal Polysaccharide-23 01/05/2017  . Zoster 11/10/2015   There are no preventive care reminders to display for this patient.  Patient Care Team: Hoyt Koch, MD as PCP - General (Internal Medicine)  Indicate any recent Medical Services you may have received from other than [facility] providers in the past year (date may be approximate).     Assessment:   This is a routine wellness examination for [name]. Physical assessment deferred to PCP.   Hearing/Vision screen  Visual Acuity Screening   Right eye Left eye Both eyes  Without correction:     With correction:   20/50  Comments: Wears glasses, reports vision issues with right eye, has an upcoming appointment with Dr. Katy Torres  Hearing Screening Comments: HOH, requesting referral to ENT  Dietary issues and exercise activities discussed: Current Exercise Habits: Structured exercise class, Type of exercise: Other - see comments (chair exercise classes), Time (Minutes): 30,  Frequency (Times/Week): 3, Weekly Exercise (Minutes/Week): 90, Intensity: Mild, Exercise limited by: orthopedic condition(s) Diet (meal preparation, eat out, water intake, caffeinated beverages, dairy products, fruits and vegetables): in general, a "healthy" diet  , well balanced  patient reports eating light meals in her apartment and 1 big meal at [place] with ensure and increasing daily intake of water    Goals    . Start to take Vitamin D-3 and a multi-vitamin          Continue to walk daily around the atrium      Depression Screen PHQ 2/9 Scores 01/05/2017 01/05/2017 12/13/2015 06/14/2015  PHQ - 2 Score 1 1 0 0  PHQ- 9 Score 3 - - -    Fall Risk Fall Risk  01/05/2017 12/13/2015  Falls in the past year? No Yes  Number falls in past yr: - 2 or more  Injury with Fall? -  Yes  Risk for fall due to : Impaired mobility;Impaired balance/gait -  Risk for fall due to (comments): discussed fall prevention -    Cognitive Function: MMSE - Mini Mental State Exam 01/05/2017  Orientation to time 5  Orientation to Place 5  Registration 3  Attention/ Calculation 5  Recall 3  Language- name 2 objects 2  Language- repeat 1  Language- follow 3 step command 3  Language- read & follow direction 1  Write a sentence 1  Copy design 1  Total score 30        Screening Tests Health Maintenance  Topic Date Due  . DEXA SCAN  01/05/2018 (Originally 02/24/1995)  . TETANUS/TDAP  01/05/2018 (Originally 02/23/1947)  . INFLUENZA VACCINE  04/21/2017  . PNA vac Low Risk Adult  Completed      Plan:    Continue to eat heart healthy diet (full of fruits, vegetables, whole grains, lean protein, water--limit salt, fat, and sugar intake) and increase physical activity as tolerated. Drink ensure as a food supplement.  Continue doing brain stimulating activities (puzzles, reading, adult coloring books, staying active) to keep memory sharp.   Bring a copy of your  advance directives to your next office visit.  During the course of the visit, [name] was educated and counseled about the following appropriate screening and preventive services:   Vaccines to include Pneumoccal, Influenza, Hepatitis B, Td, Zostavax, HCV  Cardiovascular disease screening  Colorectal cancer screening  Bone density screening  Diabetes screening  Glaucoma screening  Mammography/PAP  Nutrition counseling  Patient Instructions (the written plan) were given to the patient.    Michiel Cowboy, RN   01/05/2017

## 2017-01-04 NOTE — Progress Notes (Signed)
Pre visit review using our clinic review tool, if applicable. No additional management support is needed unless otherwise documented below in the visit note. 

## 2017-01-05 ENCOUNTER — Ambulatory Visit (INDEPENDENT_AMBULATORY_CARE_PROVIDER_SITE_OTHER): Payer: Medicare Other | Admitting: *Deleted

## 2017-01-05 ENCOUNTER — Telehealth: Payer: Self-pay | Admitting: *Deleted

## 2017-01-05 VITALS — BP 138/62 | HR 56 | Resp 20 | Ht 62.0 in | Wt 89.0 lb

## 2017-01-05 DIAGNOSIS — Z23 Encounter for immunization: Secondary | ICD-10-CM | POA: Diagnosis not present

## 2017-01-05 DIAGNOSIS — Z0000 Encounter for general adult medical examination without abnormal findings: Secondary | ICD-10-CM

## 2017-01-05 DIAGNOSIS — H6123 Impacted cerumen, bilateral: Secondary | ICD-10-CM

## 2017-01-05 NOTE — Progress Notes (Signed)
Medical screening examination/treatment/procedure(s) were performed by non-physician practitioner and as supervising physician I was immediately available for consultation/collaboration. I agree with above. Jahni Nazar A Dagon Budai, MD 

## 2017-01-05 NOTE — Patient Instructions (Addendum)
Continue to eat heart healthy diet (full of fruits, vegetables, whole grains, lean protein, water--limit salt, fat, and sugar intake) and increase physical activity as tolerated. Drink ensure as a food supplement.  Continue doing brain stimulating activities (puzzles, reading, adult coloring books, staying active) to keep memory sharp.   Bring a copy of your advance directives to your next office visit.   Jenny Torres , Thank you for taking time to come for your Medicare Wellness Visit. I appreciate your ongoing commitment to your health goals. Please review the following plan we discussed and let me know if I can assist you in the future.   These are the goals we discussed: Goals    . Start to take Vitamin D-3 and a multi-vitamin          Continue to walk daily around the [facility]       This is a list of the screening recommended for you and due dates:  Health Maintenance  Topic Date Due  . Tetanus Vaccine  02/23/1947  . DEXA scan (bone density measurement)  02/24/1995  . Pneumonia vaccines (2 of 2 - PPSV23) 11/09/2016  . Flu Shot  04/21/2017    Insomnia Insomnia is a sleep disorder that makes it difficult to fall asleep or to stay asleep. Insomnia can cause tiredness (fatigue), low energy, difficulty concentrating, mood swings, and poor performance at work or school. There are three different ways to classify insomnia:  Difficulty falling asleep.  Difficulty staying asleep.  Waking up too early in the morning. Any type of insomnia can be long-term (chronic) or short-term (acute). Both are common. Short-term insomnia usually lasts for three months or less. Chronic insomnia occurs at least three times a week for longer than three months. What are the causes? Insomnia may be caused by another condition, situation, or substance, such as:  Anxiety.  Certain medicines.  Gastroesophageal reflux disease (GERD) or other gastrointestinal conditions.  Asthma or other breathing  conditions.  Restless legs syndrome, sleep apnea, or other sleep disorders.  Chronic pain.  Menopause. This may include hot flashes.  Stroke.  Abuse of alcohol, tobacco, or illegal drugs.  Depression.  Caffeine.  Neurological disorders, such as Alzheimer disease.  An overactive thyroid (hyperthyroidism). The cause of insomnia may not be known. What increases the risk? Risk factors for insomnia include:  Gender. Women are more commonly affected than men.  Age. Insomnia is more common as you get older.  Stress. This may involve your professional or personal life.  Income. Insomnia is more common in people with lower income.  Lack of exercise.  Irregular work schedule or night shifts.  Traveling between different time zones. What are the signs or symptoms? If you have insomnia, trouble falling asleep or trouble staying asleep is the main symptom. This may lead to other symptoms, such as:  Feeling fatigued.  Feeling nervous about going to sleep.  Not feeling rested in the morning.  Having trouble concentrating.  Feeling irritable, anxious, or depressed. How is this treated? Treatment for insomnia depends on the cause. If your insomnia is caused by an underlying condition, treatment will focus on addressing the condition. Treatment may also include:  Medicines to help you sleep.  Counseling or therapy.  Lifestyle adjustments. Follow these instructions at home:  Take medicines only as directed by your health care provider.  Keep regular sleeping and waking hours. Avoid naps.  Keep a sleep diary to help you and your health care provider figure out what could  be causing your insomnia. Include:  When you sleep.  When you wake up during the night.  How well you sleep.  How rested you feel the next day.  Any side effects of medicines you are taking.  What you eat and drink.  Make your bedroom a comfortable place where it is easy to fall asleep:  Put  up shades or special blackout curtains to block light from outside.  Use a white noise machine to block noise.  Keep the temperature cool.  Exercise regularly as directed by your health care provider. Avoid exercising right before bedtime.  Use relaxation techniques to manage stress. Ask your health care provider to suggest some techniques that may work well for you. These may include:  Breathing exercises.  Routines to release muscle tension.  Visualizing peaceful scenes.  Cut back on alcohol, caffeinated beverages, and cigarettes, especially close to bedtime. These can disrupt your sleep.  Do not overeat or eat spicy foods right before bedtime. This can lead to digestive discomfort that can make it hard for you to sleep.  Limit screen use before bedtime. This includes:  Watching TV.  Using your smartphone, tablet, and computer.  Stick to a routine. This can help you fall asleep faster. Try to do a quiet activity, brush your teeth, and go to bed at the same time each night.  Get out of bed if you are still awake after 15 minutes of trying to sleep. Keep the lights down, but try reading or doing a quiet activity. When you feel sleepy, go back to bed.  Make sure that you drive carefully. Avoid driving if you feel very sleepy.  Keep all follow-up appointments as directed by your health care provider. This is important. Contact a health care provider if:  You are tired throughout the day or have trouble in your daily routine due to sleepiness.  You continue to have sleep problems or your sleep problems get worse. Get help right away if:  You have serious thoughts about hurting yourself or someone else. This information is not intended to replace advice given to you by your health care provider. Make sure you discuss any questions you have with your health care provider. Document Released: 09/04/2000 Document Revised: 02/07/2016 Document Reviewed: 06/08/2014 Elsevier  Interactive Patient Education  2017 [place] and Stress Management Stress is a normal reaction to life events. It is what you feel when life demands more than you are used to or more than you can handle. Some stress can be useful. For example, the stress reaction can help you catch the last bus of the day, study for a test, or meet a deadline at work. But stress that occurs too often or for too long can cause problems. It can affect your emotional health and interfere with relationships and normal daily activities. Too much stress can weaken your immune system and increase your risk for physical illness. If you already have a medical problem, stress can make it worse. What are the causes? All sorts of life events may cause stress. An event that causes stress for one person may not be stressful for another person. Major life events commonly cause stress. These may be positive or negative. Examples include losing your job, moving into a new home, getting married, having a baby, or losing a loved one. Less obvious life events may also cause stress, especially if they occur day after day or in combination. Examples include working long hours, driving in traffic,  caring for children, being in debt, or being in a difficult relationship. What are the signs or symptoms? Stress may cause emotional symptoms including, the following:  Anxiety. This is feeling worried, afraid, on edge, overwhelmed, or out of control.  Anger. This is feeling irritated or impatient.  Depression. This is feeling sad, down, helpless, or guilty.  Difficulty focusing, remembering, or making decisions. Stress may cause physical symptoms, including the following:  Aches and pains. These may affect your head, neck, back, stomach, or other areas of your body.  Tight muscles or clenched jaw.  Low energy or trouble sleeping. Stress may cause unhealthy behaviors, including the following:  Eating to feel better  (overeating) or skipping meals.  Sleeping too little, too much, or both.  Working too much or putting off tasks (procrastination).  Smoking, drinking alcohol, or using drugs to feel better. How is this diagnosed? Stress is diagnosed through an assessment by your health care provider. Your health care provider will ask questions about your symptoms and any stressful life events.Your health care provider will also ask about your medical history and may order blood tests or other tests. Certain medical conditions and medicine can cause physical symptoms similar to stress. Mental illness can cause emotional symptoms and unhealthy behaviors similar to stress. Your health care provider may refer you to a mental health professional for further evaluation. How is this treated? Stress management is the recommended treatment for stress.The goals of stress management are reducing stressful life events and coping with stress in healthy ways. Techniques for reducing stressful life events include the following:  Stress identification. Self-monitor for stress and identify what causes stress for you. These skills may help you to avoid some stressful events.  Time management. Set your priorities, keep a calendar of events, and learn to say "no." These tools can help you avoid making too many commitments. Techniques for coping with stress include the following:  Rethinking the problem. Try to think realistically about stressful events rather than ignoring them or overreacting. Try to find the positives in a stressful situation rather than focusing on the negatives.  Exercise. Physical exercise can release both physical and emotional tension. The key is to find a form of exercise you enjoy and do it regularly.  Relaxation techniques. These relax the body and mind. Examples include yoga, meditation, tai chi, biofeedback, deep breathing, progressive muscle relaxation, listening to music, being out in nature,  journaling, and other hobbies. Again, the key is to find one or more that you enjoy and can do regularly.  Healthy lifestyle. Eat a balanced diet, get plenty of sleep, and do not smoke. Avoid using alcohol or drugs to relax.  Strong support network. Spend time with family, friends, or other people you enjoy being around.Express your feelings and talk things over with someone you trust. Counseling or talktherapy with a mental health professional may be helpful if you are having difficulty managing stress on your own. Medicine is typically not recommended for the treatment of stress.Talk to your health care provider if you think you need medicine for symptoms of stress. Follow these instructions at home:  Keep all follow-up visits as directed by your health care provider.  Take all medicines as directed by your health care provider. Contact a health care provider if:  Your symptoms get worse or you start having new symptoms.  You feel overwhelmed by your problems and can no longer manage them on your own. Get help right away if:  You feel like  hurting yourself or someone else. This information is not intended to replace advice given to you by your health care provider. Make sure you discuss any questions you have with your health care provider. Document Released: 03/03/2001 Document Revised: 02/13/2016 Document Reviewed: 05/02/2013 Elsevier Interactive Patient Education  2017 [employer].

## 2017-01-05 NOTE — Telephone Encounter (Signed)
During AWV patient discussed she has become unhappy with her ENT physician. She would like to have a referral for a new ENT and stated that someone has recommended Dr. Thornell Mule. She would like to start going to him if possible.

## 2017-01-06 NOTE — Telephone Encounter (Signed)
She routinely gets her ears wash out and assessed for hearing loss.

## 2017-01-06 NOTE — Telephone Encounter (Signed)
What is she seeing them for? We need a reason for referral.

## 2017-01-29 ENCOUNTER — Other Ambulatory Visit: Payer: Self-pay | Admitting: Internal Medicine

## 2017-01-29 DIAGNOSIS — G603 Idiopathic progressive neuropathy: Secondary | ICD-10-CM

## 2017-03-02 ENCOUNTER — Emergency Department (HOSPITAL_COMMUNITY)
Admission: EM | Admit: 2017-03-02 | Discharge: 2017-03-02 | Disposition: A | Discharge status: Home / Self Care | Payer: Medicare Other | Attending: Emergency Medicine | Admitting: Emergency Medicine

## 2017-03-02 ENCOUNTER — Encounter (HOSPITAL_COMMUNITY): Payer: Self-pay | Admitting: Emergency Medicine

## 2017-03-02 ENCOUNTER — Emergency Department (HOSPITAL_COMMUNITY): Payer: Medicare Other

## 2017-03-02 DIAGNOSIS — Z79899 Other long term (current) drug therapy: Secondary | ICD-10-CM | POA: Diagnosis not present

## 2017-03-02 DIAGNOSIS — R933 Abnormal findings on diagnostic imaging of other parts of digestive tract: Secondary | ICD-10-CM | POA: Diagnosis not present

## 2017-03-02 DIAGNOSIS — J449 Chronic obstructive pulmonary disease, unspecified: Secondary | ICD-10-CM | POA: Insufficient documentation

## 2017-03-02 DIAGNOSIS — R112 Nausea with vomiting, unspecified: Secondary | ICD-10-CM

## 2017-03-02 DIAGNOSIS — Z87891 Personal history of nicotine dependence: Secondary | ICD-10-CM | POA: Insufficient documentation

## 2017-03-02 DIAGNOSIS — I10 Essential (primary) hypertension: Secondary | ICD-10-CM | POA: Diagnosis not present

## 2017-03-02 DIAGNOSIS — Z7982 Long term (current) use of aspirin: Secondary | ICD-10-CM | POA: Diagnosis not present

## 2017-03-02 LAB — COMPREHENSIVE METABOLIC PANEL
ALK PHOS: 96 U/L (ref 38–126)
ALT: 16 U/L (ref 14–54)
AST: 31 U/L (ref 15–41)
Albumin: 4.7 g/dL (ref 3.5–5.0)
Anion gap: 17 — ABNORMAL HIGH (ref 5–15)
BUN: 20 mg/dL (ref 6–20)
CALCIUM: 9.9 mg/dL (ref 8.9–10.3)
CO2: 22 mmol/L (ref 22–32)
CREATININE: 0.74 mg/dL (ref 0.44–1.00)
Chloride: 94 mmol/L — ABNORMAL LOW (ref 101–111)
Glucose, Bld: 96 mg/dL (ref 65–99)
Potassium: 3.9 mmol/L (ref 3.5–5.1)
Sodium: 133 mmol/L — ABNORMAL LOW (ref 135–145)
Total Bilirubin: 3 mg/dL — ABNORMAL HIGH (ref 0.3–1.2)
Total Protein: 8 g/dL (ref 6.5–8.1)

## 2017-03-02 LAB — URINALYSIS, ROUTINE W REFLEX MICROSCOPIC
Bacteria, UA: NONE SEEN
Bilirubin Urine: NEGATIVE
GLUCOSE, UA: NEGATIVE mg/dL
Hgb urine dipstick: NEGATIVE
KETONES UR: 80 mg/dL — AB
Leukocytes, UA: NEGATIVE
Nitrite: NEGATIVE
PH: 5 (ref 5.0–8.0)
Protein, ur: 30 mg/dL — AB
SPECIFIC GRAVITY, URINE: 1.024 (ref 1.005–1.030)

## 2017-03-02 LAB — CBC WITH DIFFERENTIAL/PLATELET
BASOS ABS: 0 10*3/uL (ref 0.0–0.1)
Basophils Relative: 0 %
EOS ABS: 0 10*3/uL (ref 0.0–0.7)
EOS PCT: 0 %
HCT: 39.2 % (ref 36.0–46.0)
Hemoglobin: 13.7 g/dL (ref 12.0–15.0)
LYMPHS ABS: 1.1 10*3/uL (ref 0.7–4.0)
Lymphocytes Relative: 10 %
MCH: 29.9 pg (ref 26.0–34.0)
MCHC: 34.9 g/dL (ref 30.0–36.0)
MCV: 85.6 fL (ref 78.0–100.0)
Monocytes Absolute: 1 10*3/uL (ref 0.1–1.0)
Monocytes Relative: 9 %
Neutro Abs: 9.3 10*3/uL — ABNORMAL HIGH (ref 1.7–7.7)
Neutrophils Relative %: 81 %
PLATELETS: 292 10*3/uL (ref 150–400)
RBC: 4.58 MIL/uL (ref 3.87–5.11)
RDW: 13.2 % (ref 11.5–15.5)
WBC: 11.5 10*3/uL — AB (ref 4.0–10.5)

## 2017-03-02 LAB — LIPASE, BLOOD: LIPASE: 30 U/L (ref 11–51)

## 2017-03-02 MED ORDER — IOPAMIDOL (ISOVUE-300) INJECTION 61%
80.0000 mL | Freq: Once | INTRAVENOUS | Status: AC | PRN
  Administered 2017-03-02: 80 mL via INTRAVENOUS

## 2017-03-02 MED ORDER — IOPAMIDOL (ISOVUE-300) INJECTION 61%
INTRAVENOUS | Status: AC
  Filled 2017-03-02: qty 100

## 2017-03-02 MED ORDER — ONDANSETRON 4 MG PO TBDP: 4.0000 mg | ORAL_TABLET | Freq: Three times a day (TID) | ORAL | 0 refills | Status: DC | PRN

## 2017-03-02 MED ORDER — ONDANSETRON HCL 4 MG/2ML IJ SOLN
4.0000 mg | Freq: Once | INTRAMUSCULAR | Status: AC
  Administered 2017-03-02: 4 mg via INTRAVENOUS
  Filled 2017-03-02: qty 2

## 2017-03-02 MED ORDER — SODIUM CHLORIDE 0.9 % IV BOLUS (SEPSIS)
500.0000 mL | Freq: Once | INTRAVENOUS | Status: AC
  Administered 2017-03-02: 500 mL via INTRAVENOUS

## 2017-03-02 MED ORDER — SODIUM CHLORIDE 0.9 % IV SOLN
Freq: Once | INTRAVENOUS | Status: AC
  Administered 2017-03-02: 100 mL/h via INTRAVENOUS

## 2017-03-02 NOTE — ED Triage Notes (Signed)
Per [facility] comes from [place] for n/v since Sunday and unable to eat.  patient denies diarrhea or pain. CBG 105

## 2017-03-02 NOTE — ED Notes (Signed)
Notified [facility] for transportation. Estimated about an hour before transport.

## 2017-03-02 NOTE — ED Provider Notes (Signed)
[place] DEPT Provider Note   CSN: [id number] Arrival date & time: 03/02/17  1027     History   Chief Complaint Chief Complaint  Patient presents with  . Nausea  . Emesis    HPI Jenny Torres is a 83 y.o. female.  The history is provided by the patient and medical records. No language interpreter was used.  Emesis   Pertinent negatives include no abdominal pain and no diarrhea.   Jenny Torres is a 83 y.o. female  with a PMH as listed below including, but not limited to GERD, gastritis, prior cholecystectomy who presents to the Emergency Department from [facility] facility for progressively worsening nausea and vomiting since Friday (4 days). She has been able to tolerate fluids okay, but since Sunday, has not been able to eat any food without throwing up. Denies any pain, no abdominal pain. No diarrhea or constipation. Had normal BM yesterday with no blood. No fever or chills. No alleviating or aggravating factors noted. No medications taken prior to arrival for symptoms.  Past Medical History:  Diagnosis Date  . Anorexia   . Anxiety   . Carotid artery occlusion   . COPD (chronic obstructive pulmonary disease) ([place])   . Depression   . Diverticula, colon   . Gastritis   . GERD (gastroesophageal reflux disease)   . Hemorrhoids, external   . HTN (hypertension)   . Hyperlipidemia   . Hyperlipidemia   . Idiopathic peripheral neuropathy    chronic  . MVP (mitral valve prolapse)    stable  . Osteoporosis   . Palpitations   . Polyp of colon   . Raynaud's disease     Patient Active Problem List   Diagnosis Date Noted  . Physical deconditioning   . Pressure injury of skin 09/15/2016  . SIRS (systemic inflammatory response syndrome) ([place]) 09/14/2016  . Dysuria 09/08/2016  . Syncope and collapse 12/02/2014  . Hyperlipidemia 12/02/2014  . COPD (chronic obstructive pulmonary disease) ([place]) 12/02/2014  . Insomnia 02/18/2014  . Protein-calorie malnutrition,  severe ([place]) 02/12/2014  . Low vitamin B12 level 12/29/2012  . Pulmonary nodule 10/21/2012  . Idiopathic peripheral neuropathy   . MVP (mitral valve prolapse)   . Osteoporosis   . Raynaud's disease 04/24/2011  . Anxiety state 11/10/2007  . Depression 11/10/2007  . Chronic hypertension 11/10/2007    Past Surgical History:  Procedure Laterality Date  . CESAREAN SECTION     x2  . CHOLECYSTECTOMY    . INTRAMEDULLARY (IM) NAIL INTERTROCHANTERIC Right 02/11/2014    OB History    No data available       Home Medications    Prior to Admission medications   Medication Sig Start Date End Date Taking? Authorizing Provider  amLODipine (NORVASC) 2.5 MG tablet TAKE 1 TABLET DAILY 12/31/16  Yes Hoyt Koch, MD  aspirin EC 81 MG tablet Take 81 mg by mouth daily.   Yes [provider]  gabapentin (NEURONTIN) 800 MG tablet TAKE 1 TABLET TWICE A DAY 01/29/17  Yes Hoyt Koch, MD  metoprolol tartrate (LOPRESSOR) 25 MG tablet TAKE ONE-HALF (1/2) TABLET TWICE A DAY 01/29/17  Yes Hoyt Koch, MD  Multiple Vitamin (MULTIVITAMIN WITH MINERALS) TABS tablet Take 1 tablet by mouth daily.   Yes [provider]  sertraline (ZOLOFT) 25 MG tablet Take 1 tablet (25 mg total) by mouth daily. 09/28/16  Yes Hoyt Koch, MD  zolpidem (AMBIEN) 5 MG tablet Take 1 tablet (5 mg total)  by mouth at bedtime. 11/25/16  Yes Hoyt Koch, MD  feeding supplement, ENSURE ENLIVE, (ENSURE ENLIVE) LIQD Take 237 mLs by mouth 3 (three) times daily between meals. Patient not taking: Reported on 03/02/2017 09/17/16   Barton Dubois, MD  ondansetron (ZOFRAN ODT) 4 MG disintegrating tablet Take 1 tablet (4 mg total) by mouth every 8 (eight) hours as needed for nausea or vomiting. 03/02/17   Katelynn Heidler, Ozella Almond, PA-C    Family History Family History  Problem Relation Age of Onset  . Breast cancer Mother   . Liver disease Brother   . Cirrhosis Brother     Social  History Social History  Substance Use Topics  . Smoking status: Former Smoker    Packs/day: 1.00    Years: 25.00    Types: Cigarettes    Quit date: 09/22/1983  . Smokeless tobacco: Never Used  . Alcohol use Yes     Comment: rarely     Allergies   Lovastatin; Sulfamethoxazole; Darvocet [propoxyphene n-acetaminophen]; Erythromycin; Hydrocod polst-cpm polst er; Penicillins; Theophyllines; Zovirax [acyclovir]; Ceftin; and Paroxetine hcl   Review of Systems Review of Systems  Gastrointestinal: Positive for nausea and vomiting. Negative for abdominal pain, blood in stool, constipation and diarrhea.  All other systems reviewed and are negative.    Physical Exam Updated Vital Signs BP (!) 181/90 (BP Location: Left Arm)   Pulse 100   Temp 97.9 F (36.6 C) (Oral)   Resp (!) 21   Ht '4\' 11"'  (1.499 m)   Wt 39.9 kg (88 lb)   SpO2 100%   BMI 17.77 kg/m   Physical Exam  Constitutional: She is oriented to person, place, and time. She appears well-developed and well-nourished. No distress.  HENT:  Head: Normocephalic and atraumatic.  Cardiovascular: Normal rate, regular rhythm and normal heart sounds.   No murmur heard. Pulmonary/Chest: Effort normal and breath sounds normal. No respiratory distress.  Abdominal: Soft. Bowel sounds are normal. She exhibits no distension.  No abdominal or CVA tenderness.  Musculoskeletal: Normal range of motion.  Neurological: She is alert and oriented to person, place, and time.  Skin: Skin is warm and dry.  Nursing note and vitals reviewed.    ED Treatments / Results  Labs (all labs ordered are listed, but only abnormal results are displayed) Labs Reviewed  COMPREHENSIVE METABOLIC PANEL - Abnormal; Notable for the following:       Result Value   Sodium 133 (*)    Chloride 94 (*)    Total Bilirubin 3.0 (*)    Anion gap 17 (*)    All other components within normal limits  CBC WITH DIFFERENTIAL/PLATELET - Abnormal; Notable for the following:     WBC 11.5 (*)    Neutro Abs 9.3 (*)    All other components within normal limits  URINALYSIS, ROUTINE W REFLEX MICROSCOPIC - Abnormal; Notable for the following:    Ketones, ur 80 (*)    Protein, ur 30 (*)    Squamous Epithelial / LPF 0-5 (*)    All other components within normal limits  URINE CULTURE  LIPASE, BLOOD    EKG  EKG Interpretation  Date/Time:  Tuesday March 02 2017 11:11:25 EDT Ventricular Rate:  107 PR Interval:    QRS Duration: 88 QT Interval:  352 QTC Calculation: 470 R Axis:   40 Text Interpretation:  Sinus tachycardia No significant change since last tracing Confirmed by Ashok Cordia  MD, Lennette Bihari ([id number]) on 03/02/2017 12:40:48 PM  Radiology Ct Abdomen Pelvis W Contrast  Result Date: 03/02/2017 CLINICAL DATA:  Unable to the eat over the last 2 days. EXAM: CT ABDOMEN AND PELVIS WITH CONTRAST TECHNIQUE: Multidetector CT imaging of the abdomen and pelvis was performed using the standard protocol following bolus administration of intravenous contrast. CONTRAST:  56m ISOVUE-300 IOPAMIDOL (ISOVUE-300) INJECTION 61% COMPARISON:  07/06/2007 FINDINGS: Lower chest: Mild pulmonary scarring. No active disease. Old healed rib fractures on the right. Hepatobiliary: Hepatic parenchyma is normal. Previous cholecystectomy. Ductal system appears normal. Pancreas: Normal Spleen: Normal Adrenals/Urinary Tract: Adrenal glands are normal. Kidneys are normal. No cyst, mass, stone or hydronephrosis. Stomach/Bowel: No evidence of obstruction, mass or inflammatory disease. Vascular/Lymphatic: Aortic atherosclerosis. No aneurysm. IVC is normal. No retroperitoneal adenopathy. Reproductive: No pelvic mass. Other: No free fluid or air. Musculoskeletal: Old healed pelvic fractures. IMPRESSION: No acute or significant finding to explain the clinical presentation. There is no evidence of active bowel pathology. No significant organ finding. Aortic atherosclerosis. Electronically Signed   By: MNelson Chimes M.D.   On: 03/02/2017 14:07    Procedures Procedures (including critical care time)  Medications Ordered in ED Medications  iopamidol (ISOVUE-300) 61 % injection (not administered)  sodium chloride 0.9 % bolus 500 mL (0 mLs Intravenous Stopped 03/02/17 1220)    Followed by  0.9 %  sodium chloride infusion ( Intravenous Stopped 03/02/17 1539)  ondansetron (ZOFRAN) injection 4 mg (4 mg Intravenous Given 03/02/17 1137)  iopamidol (ISOVUE-300) 61 % injection 80 mL (80 mLs Intravenous Contrast Given 03/02/17 1335)     Initial Impression / Assessment and Plan / ED Course  I have reviewed the triage vital signs and the nursing notes.  Pertinent labs & imaging results that were available during my care of the patient were reviewed by me and considered in my medical decision making (see chart for details).    VAnnais CraftsDHorkeyis a 83y.o. female [facility] On exam, patient is afebrile, hemodynamically stable with benign abdominal exam. No CVA tenderness. Labs reviewed. Bili of 3.0 with normal AST/ALT/alk phos. Na 133, Cl 94. CBC with mild leukocytosis of 11.5. UA not infectious. CT with no acute or significant findings. Patient now tolerating PO. She has had no episodes of emesis while in ED. Repeat abdominal exam with no tenderness. Will have patient follow up with GI. Zofran as needed for n/v. Reasons to return to ED discussed. All questions answered.  Patient seen by and discussed with Dr. SAshok Cordiawho agrees with treatment plan.   Final Clinical Impressions(s) / ED Diagnoses   Final diagnoses:  Non-intractable vomiting with nausea, unspecified vomiting type    New Prescriptions New Prescriptions   ONDANSETRON (ZOFRAN ODT) 4 MG DISINTEGRATING TABLET    Take 1 tablet (4 mg total) by mouth every 8 (eight) hours as needed for nausea or vomiting.     Evanny Ellerbe, JOzella Almond PA-C 03/02/17 1551    SLajean Saver MD 03/05/17  [phone]

## 2017-03-02 NOTE — Discharge Instructions (Signed)
It was my pleasure taking care of you today!   Zofran as needed for nausea/vomiting. Increase hydration.   Fortunately, your lab work and imaging was very reassuring. You had a CT scan of your abdomen and pelvis which was negative. Your urine sample showed no signs of infection. You were dehydrated and received IV fluids.   Please follow up with your primary care provider or the GI clinic listed for further discussion of your hospital visit.  At this time, there does not appear to be an acute, emergent cause for your nausea/vomiting. However, this does not mean that this may not become an emergency in the future. It is VERY important that you monitor your symptoms and return to the Emergency Department if you develop any of the following symptoms:  You have a fever.  You keep throwing up and can't keep fluids down.  You pass bloody or black tarry stools.  There is bright red blood in the stool. You do not seem to be getting better.  You have any questions or concerns.

## 2017-03-02 NOTE — ED Notes (Signed)
PT does not want rectal temp.

## 2017-03-03 LAB — URINE CULTURE: CULTURE: NO GROWTH

## 2017-03-09 ENCOUNTER — Telehealth: Payer: Self-pay | Admitting: Internal Medicine

## 2017-03-09 MED ORDER — ZOLPIDEM TARTRATE 5 MG PO TABS: 5.0000 mg | ORAL_TABLET | Freq: Every day | ORAL | 0 refills | Status: DC

## 2017-03-09 NOTE — Telephone Encounter (Signed)
Pt is requesting a refill on zolpidem (AMBIEN) 5 MG tablet be sent to [facility].

## 2017-03-09 NOTE — Telephone Encounter (Signed)
[place] Cont[place] Substance Database reviewed with no irregularities and medication refilled.

## 2017-03-09 NOTE — Telephone Encounter (Signed)
Routing to greg, please advise, thanks 

## 2017-03-10 NOTE — Telephone Encounter (Signed)
[facility]

## 2017-03-15 ENCOUNTER — Telehealth: Payer: Self-pay | Admitting: Internal Medicine

## 2017-03-15 MED ORDER — RAMELTEON 8 MG PO TABS: 8.0000 mg | ORAL_TABLET | Freq: Every day | ORAL | 2 refills | Status: DC

## 2017-03-15 NOTE — Telephone Encounter (Signed)
Joann from [place] called stating that the zolpidem tartrate is not covered through the pts insurance. They are recommending changing the medication to trazodone or rozerem. Please advise.  Ref# [id number]

## 2017-03-15 NOTE — Telephone Encounter (Signed)
Ramelteon sent to pharmacy.

## 2017-03-16 MED ORDER — RAMELTEON 8 MG PO TABS: 8.0000 mg | ORAL_TABLET | Freq: Every day | ORAL | 0 refills | Status: DC

## 2017-03-16 NOTE — Addendum Note (Signed)
Addended by: Delice Bison E on: 03/16/2017 04:41 PM   Modules accepted: Orders

## 2017-03-16 NOTE — Telephone Encounter (Signed)
Rx sent 

## 2017-03-16 NOTE — Telephone Encounter (Signed)
Patient is out of Ramelteon.  She is requesting a month supply to be sent to [facility].  Please follow up with patient as soon as sent.

## 2017-03-17 ENCOUNTER — Telehealth: Payer: Self-pay | Admitting: Internal Medicine

## 2017-03-17 MED ORDER — ZOLPIDEM TARTRATE 5 MG PO TABS: 5.0000 mg | ORAL_TABLET | Freq: Every evening | ORAL | 0 refills | Status: DC | PRN

## 2017-03-17 NOTE — Telephone Encounter (Signed)
Medication printed to be faxed.  

## 2017-03-17 NOTE — Telephone Encounter (Signed)
Pharmacy would like a call back today if at all possible.  States that patient was wanting Ambien.  Got Rozerem.  State that this was sent to mail order so Maggie Font can not fill.  Also states that patient does not want this medication. Did inform pharmacy that Marya Amsler was not in this afternoon.

## 2017-03-17 NOTE — Addendum Note (Signed)
Addended by: Mauricio Po D on: 03/17/2017 08:52 PM   Modules accepted: Orders

## 2017-03-17 NOTE — Telephone Encounter (Signed)
Pt called stating that she does not want to switch medications. I told her that [facility] called [place] stating that Ambien would not be covered under her insurance. She said "I don't care what they want" and wants to stay on Ambien. She said that she would pay the cash price for it. She is asking for it to be sent to [facility] as soon as possible because she is needing it.

## 2017-03-18 NOTE — Telephone Encounter (Signed)
Medication faxed. Pt is aware.

## 2017-03-18 NOTE — Telephone Encounter (Signed)
Rx has been faxed.

## 2017-03-31 NOTE — Telephone Encounter (Signed)
Pt said that she does not want to change to the Rozerem. She does have enough zolpidem to get her through the month but she wanted to go ahead and get this [facility] so she would not have to go without.

## 2017-04-05 MED ORDER — ZOLPIDEM TARTRATE 5 MG PO TABS: 5.0000 mg | ORAL_TABLET | Freq: Every evening | ORAL | 1 refills | Status: DC | PRN

## 2017-04-05 NOTE — Addendum Note (Signed)
Addended by: Mauricio Po D on: 04/05/2017 10:21 AM   Modules accepted: Orders

## 2017-04-05 NOTE — Telephone Encounter (Signed)
Medication refilled

## 2017-04-08 ENCOUNTER — Encounter: Payer: Self-pay | Admitting: Urgent Care

## 2017-04-08 ENCOUNTER — Ambulatory Visit (INDEPENDENT_AMBULATORY_CARE_PROVIDER_SITE_OTHER): Payer: Medicare Other | Admitting: Urgent Care

## 2017-04-08 VITALS — BP 135/85 | HR 88 | Temp 97.5°F | Resp 18

## 2017-04-08 DIAGNOSIS — R112 Nausea with vomiting, unspecified: Secondary | ICD-10-CM | POA: Diagnosis not present

## 2017-04-08 DIAGNOSIS — I10 Essential (primary) hypertension: Secondary | ICD-10-CM | POA: Diagnosis not present

## 2017-04-08 DIAGNOSIS — I341 Nonrheumatic mitral (valve) prolapse: Secondary | ICD-10-CM

## 2017-04-08 DIAGNOSIS — E860 Dehydration: Secondary | ICD-10-CM | POA: Diagnosis not present

## 2017-04-08 DIAGNOSIS — G4700 Insomnia, unspecified: Secondary | ICD-10-CM

## 2017-04-08 DIAGNOSIS — J449 Chronic obstructive pulmonary disease, unspecified: Secondary | ICD-10-CM

## 2017-04-08 LAB — POCT CBC
GRANULOCYTE PERCENT: 76 % (ref 37–80)
HCT, POC: 41.1 % (ref 37.7–47.9)
Hemoglobin: 13.7 g/dL (ref 12.2–16.2)
Lymph, poc: 1.5 (ref 0.6–3.4)
MCH, POC: 29.5 pg (ref 27–31.2)
MCHC: 33.3 g/dL (ref 31.8–35.4)
MCV: 88.6 fL (ref 80–97)
MID (CBC): 0.7 (ref 0–0.9)
MPV: 6.6 fL (ref 0–99.8)
PLATELET COUNT, POC: 394 10*3/uL (ref 142–424)
POC Granulocyte: 7.1 — AB (ref 2–6.9)
POC LYMPH %: 16.5 % (ref 10–50)
POC MID %: 7.5 %M (ref 0–12)
RBC: 4.65 M/uL (ref 4.04–5.48)
RDW, POC: 13.8 %
WBC: 9.3 10*3/uL (ref 4.6–10.2)

## 2017-04-08 LAB — POCT URINALYSIS DIP (MANUAL ENTRY)
Glucose, UA: NEGATIVE mg/dL
Nitrite, UA: NEGATIVE
Spec Grav, UA: >=1.03 — AB (ref 1.010–1.025)
UROBILINOGEN UA: 1 U/dL
pH, UA: 6 (ref 5.0–8.0)

## 2017-04-08 LAB — GLUCOSE, POCT (MANUAL RESULT ENTRY): POC Glucose: 82 mg/dl (ref 70–99)

## 2017-04-08 MED ORDER — SODIUM CHLORIDE 0.9 % IV BOLUS (SEPSIS)
500.0000 mL | Freq: Once | INTRAVENOUS | Status: AC
  Administered 2017-04-08: 500 mL via INTRAVENOUS

## 2017-04-08 MED ORDER — ONDANSETRON HCL 4 MG/2ML IJ SOLN
2.0000 mg | Freq: Once | INTRAMUSCULAR | Status: AC
  Administered 2017-04-08: 2 mg via INTRAVENOUS

## 2017-04-08 NOTE — Progress Notes (Signed)
MRN: [id number] DOB: 04/24/1928  Subjective:   Jenny Torres is a 83 y.o. female presenting for chief complaint of Nausea and Emesis  Patient is a resident at [facility], is independent living facility. Has a surgical history of cholecystectomy, C-section. Patient was seen in [place] ER for the same on 03/02/2017. Had negative CT abdomen, slight electrolyte disturbance of Na 133, Cl 94, cbc with mild leukocytosis of 11.5 and non-infectious UA per PA Ward. Ecg showed sinus tachycardia. Patient was stabilized in the ER with IV fluids, Zofran and discharged without admission. Her symptoms resolved thereafter for several weeks. Today, reports that she has had nausea with vomiting for the past 4 days. She is not tolerating solids foods. Fluids are difficult, but she is hydrating with water and ginger ale. Patient is not passing gas, is having normal bowel movements every other day without blood. Denies an outbreak of diarrhea, GI illness at her living facility. Denies fever, confusion, chest pain, heart racing, shob, hematemesis, abdominal pain, dizziness, dysuria, hematuria, urinary frequency. Denies smoking cigarettes. Admits history of abnormal heart rhythm. She used to have an abnormal heart rhythm. Used to be managed with Dr. Mare Ferrari. Has history of MVP, palpitations, carotid artery occlusion. Last OV with Dr. Mare Ferrari was in 10/31/2015, was to follow up with cardiology on as needed basis. She does have insomnia, uses Ambien nightly. Has used this for years. She gets ~4 hours of sleep nightly. Denies having any issues with Ambien previously and is not agreeable to stopping her medications.  Jenny Torres has a current medication list which includes the following prescription(s): amlodipine, aspirin ec, feeding supplement (ensure enlive), gabapentin, metoprolol tartrate, multivitamin with minerals, ondansetron, sertraline, and zolpidem. Also is allergic to lovastatin; sulfamethoxazole; darvocet  [propoxyphene n-acetaminophen]; erythromycin; hydrocod polst-cpm polst er; penicillins; theophyllines; zovirax [acyclovir]; ceftin; and paroxetine hcl. Jenny Torres  has a past medical history of Anorexia; Anxiety; Carotid artery occlusion; COPD (chronic obstructive pulmonary disease) ([place]); Depression; Diverticula, colon; Gastritis; GERD (gastroesophageal reflux disease); Hemorrhoids, external; HTN (hypertension); Hyperlipidemia; Hyperlipidemia; Idiopathic peripheral neuropathy; MVP (mitral valve prolapse); Osteoporosis; Palpitations; Polyp of colon; and Raynaud's disease. Also  has a past surgical history that includes Cholecystectomy; Cesarean section; and Intramedullary (im) nail intertrochanteric (Right, 02/11/2014).  Objective:   Vitals: BP 135/85   Pulse 88   Temp (!) 97.5 F (36.4 C) (Oral)   Resp 18   SpO2 98%   Physical Exam  Constitutional: She is oriented to person, place, and time. She appears well-developed and well-nourished.  HENT:  Mouth/Throat: Oropharynx is clear and moist.  Eyes: No scleral icterus.  Cardiovascular: Normal rate, regular rhythm and intact distal pulses.  Exam reveals no gallop and no friction rub.   No murmur heard. Pulmonary/Chest: No respiratory distress. She has no wheezes. She has no rales.  Abdominal: Soft. Bowel sounds are normal. She exhibits no distension and no mass. There is no tenderness. There is no guarding.  Musculoskeletal: She exhibits no edema.  Neurological: She is alert and oriented to person, place, and time.  Skin: Skin is warm and dry.  Psychiatric: She has a normal mood and affect.   Results for orders placed or performed in visit on 04/08/17 (from the past 24 hour(s))  POCT glucose (manual entry)     Status: None   Collection Time: 04/08/17  3:30 PM  Result Value Ref Range   POC Glucose 82 70 - 99 mg/dl  POCT CBC     Status: Abnormal   Collection Time: 04/08/17  3:36  PM  Result Value Ref Range   WBC 9.3 4.6 - 10.2 K/uL   Lymph,  poc 1.5 0.6 - 3.4   POC LYMPH PERCENT 16.5 10 - 50 %L   MID (cbc) 0.7 0 - 0.9   POC MID % 7.5 0 - 12 %M   POC Granulocyte 7.1 (A) 2 - 6.9   Granulocyte percent 76.0 37 - 80 %G   RBC 4.65 4.04 - 5.48 M/uL   Hemoglobin 13.7 12.2 - 16.2 g/dL   HCT, POC 41.1 37.7 - 47.9 %   MCV 88.6 80 - 97 fL   MCH, POC 29.5 27 - 31.2 pg   MCHC 33.3 31.8 - 35.4 g/dL   RDW, POC 13.8 %   Platelet Count, POC 394 142 - 424 K/uL   MPV 6.6 0 - 99.8 fL  POCT urinalysis dipstick     Status: Abnormal   Collection Time: 04/08/17  4:30 PM  Result Value Ref Range   Color, UA yellow yellow   Clarity, UA clear clear   Glucose, UA negative negative mg/dL   Bilirubin, UA small (A) negative   Ketones, POC UA >= (160) (A) negative mg/dL   Spec Grav, UA >=1.030 (A) 1.010 - 1.025   Blood, UA trace-lysed (A) negative   pH, UA 6.0 5.0 - 8.0   Protein Ur, POC =100 (A) negative mg/dL   Urobilinogen, UA 1.0 0.2 or 1.0 E.U./dL   Nitrite, UA Negative Negative   Leukocytes, UA Trace (A) Negative   Assessment and Plan :   1. Nausea and vomiting, intractability of vomiting not specified, unspecified vomiting type 2. Dehydration - Patient improved s/p 1L fluids and IV Zofran. I recommended aggressive hydration at home. Labs pending. She agreed to close follow up, will come back in 1 week. Return-to-clinic/ED precautions discussed, patient verbalized understanding.   3. Essential hypertension 4. MVP (mitral valve prolapse) 5. Chronic obstructive pulmonary disease, unspecified COPD type ([place]) - Patient declines further work up for now including imaging and ecg. She would like to go home and try to hydrate better. She is going to use the Zofran she has at home, she denied refill in clinic today.   6. Insomnia, unspecified type - Discussed possibility of Ambien causing side effect of nausea. Patient is not agreeable to switching therapies.   Jaynee Eagles, PA-C [facility] [phone] 04/08/2017  2:51 PM

## 2017-04-08 NOTE — Patient Instructions (Addendum)
Nausea and Vomiting, Adult Nausea is the feeling that you have an upset stomach or have to vomit. As nausea gets worse, it can lead to vomiting. Vomiting occurs when stomach contents are thrown up and out of the mouth. Vomiting can make you feel weak and cause you to become dehydrated. Dehydration can make you tired and thirsty, cause you to have a dry mouth, and decrease how often you urinate. Older adults and people with other diseases or a weak immune system are at higher risk for dehydration. It is important to treat your nausea and vomiting as told by your health care provider. Follow these instructions at home: Follow instructions from your health care provider about how to care for yourself at home. Eating and drinking Follow these recommendations as told by your health care provider:  Take an oral rehydration solution (ORS). This is a drink that is sold at pharmacies and retail stores.  Drink clear fluids in small amounts as you are able. Clear fluids include water, ice chips, diluted fruit juice, and low-calorie sports drinks.  Eat bland, easy-to-digest foods in small amounts as you are able. These foods include bananas, applesauce, rice, lean meats, toast, and crackers.  Avoid fluids that contain a lot of sugar or caffeine, such as energy drinks, sports drinks, and soda.  Avoid alcohol.  Avoid spicy or fatty foods.  General instructions  Drink enough fluid to keep your urine clear or pale yellow.  Wash your hands often. If soap and water are not available, use hand sanitizer.  Make sure that all people in your household wash their hands well and often.  Take over-the-counter and prescription medicines only as told by your health care provider.  Rest at home while you recover.  Watch your condition for any changes.  Breathe slowly and deeply when you feel nauseated.  Keep all follow-up visits as told by your health care provider. This is important. Contact a health care  provider if:  You have a fever.  You cannot keep fluids down.  Your symptoms get worse.  You have new symptoms.  Your nausea does not go away after two days.  You feel light-headed or dizzy.  You have a headache.  You have muscle cramps. Get help right away if:  You have pain in your chest, neck, arm, or jaw.  You feel extremely weak or you faint.  You have persistent vomiting.  You see blood in your vomit.  Your vomit looks like black coffee grounds.  You have bloody or black stools or stools that look like tar.  You have a severe headache, a stiff neck, or both.  You have a rash.  You have severe pain, cramping, or bloating in your abdomen.  You have trouble breathing or you are breathing very quickly.  Your heart is beating very quickly.  Your skin feels cold and clammy.  You feel confused.  You have pain when you urinate.  You have signs of dehydration, such as: ? Dark urine, very little urine, or no urine. ? Cracked lips. ? Dry mouth. ? Sunken eyes. ? Sleepiness. ? Weakness. These symptoms may represent a serious problem that is an emergency. Do not wait to see if the symptoms will go away. Get medical help right away. Call your local emergency services (911 in the [place] Do not drive yourself to the hospital. This information is not intended to replace advice given to you by your health care provider. Make sure you discuss any questions you   have with your health care provider. Document Released: 09/07/2005 Document Revised: 02/10/2016 Document Reviewed: 05/14/2015 Elsevier Interactive Patient Education  2017 [employer].     IF you received an x-ray today, you will receive an invoice from [facility]. Please contact [facility]iology at [phone] with questions or concerns regarding your invoice.   IF you received labwork today, you will receive an invoice from [place]. Please contact [facility] at [phone] with questions or  concerns regarding your invoice.   Our billing staff will not be able to assist you with questions regarding bills from these companies.  You will be contacted with the lab results as soon as they are available. The fastest way to get your results is to activate your My Chart account. Instructions are located on the last page of this paperwork. If you have not heard from [place] regarding the results in 2 weeks, please contact this office.

## 2017-04-09 ENCOUNTER — Telehealth: Payer: Self-pay | Admitting: Urgent Care

## 2017-04-09 LAB — COMPREHENSIVE METABOLIC PANEL
ALK PHOS: 46 IU/L (ref 39–117)
ALT: 40 IU/L — AB (ref 0–32)
AST: 31 IU/L (ref 0–40)
Albumin/Globulin Ratio: 1.5 (ref 1.2–2.2)
Albumin: 4.6 g/dL (ref 3.5–4.7)
BUN/Creatinine Ratio: 9 — ABNORMAL LOW (ref 12–28)
BUN: 8 mg/dL (ref 8–27)
Bilirubin Total: 0.5 mg/dL (ref 0.0–1.2)
CALCIUM: 9.5 mg/dL (ref 8.7–10.3)
CO2: 22 mmol/L (ref 20–29)
CREATININE: 0.9 mg/dL (ref 0.57–1.00)
Chloride: 100 mmol/L (ref 96–106)
GFR calc Af Amer: 66 mL/min/{1.73_m2} (ref >59)
GFR, EST NON AFRICAN AMERICAN: 58 mL/min/{1.73_m2} — AB (ref >59)
GLOBULIN, TOTAL: 3 g/dL (ref 1.5–4.5)
GLUCOSE: 136 mg/dL — AB (ref 65–99)
Potassium: 4.3 mmol/L (ref 3.5–5.2)
SODIUM: 140 mmol/L (ref 134–144)
Total Protein: 7.6 g/dL (ref 6.0–8.5)

## 2017-04-09 LAB — URINALYSIS, MICROSCOPIC ONLY: CASTS: NONE SEEN /LPF

## 2017-04-09 NOTE — Telephone Encounter (Signed)
Please review, thank you/ S.Gissella Niblack,CMA

## 2017-04-09 NOTE — Telephone Encounter (Signed)
Pts daughter came in to say that due to the conditions that her mom is in right now that she needs something saying that she would be doing best with home care and if we can get that information faxed over to the [facility] facility & make it it out to [place], the fax # is [phone]. She did fill out a DPR & medical record release form for [place] just in case.   Please Advise

## 2017-04-10 ENCOUNTER — Telehealth: Payer: Self-pay | Admitting: Urgent Care

## 2017-04-10 NOTE — Telephone Encounter (Signed)
Patient needs to see her PCP, Dr. Sharlet Salina. I have only seen this patient once for nausea and vomiting. Please have her daughter contact the patient's PCP for this.

## 2017-04-10 NOTE — Telephone Encounter (Signed)
Pt is needing to let Bess Harvest know that she has not developed diarrhea on top of the issues she was seen for the other day   Best number [phone]  [facility] on [address] if needed

## 2017-04-12 ENCOUNTER — Other Ambulatory Visit: Payer: Self-pay | Admitting: Family

## 2017-04-13 NOTE — Telephone Encounter (Signed)
Check [place] registry last filled 04/08/2017...lmb  

## 2017-04-13 NOTE — Telephone Encounter (Signed)
FYI

## 2017-04-14 ENCOUNTER — Ambulatory Visit: Payer: Medicare Other | Admitting: Urgent Care

## 2017-04-14 NOTE — Telephone Encounter (Signed)
Thank you for the FYI

## 2017-04-14 NOTE — Telephone Encounter (Signed)
LVM regarding release of info.

## 2017-04-15 ENCOUNTER — Encounter: Payer: Self-pay | Admitting: Urgent Care

## 2017-04-15 ENCOUNTER — Ambulatory Visit (INDEPENDENT_AMBULATORY_CARE_PROVIDER_SITE_OTHER): Payer: Medicare Other | Admitting: Urgent Care

## 2017-04-15 ENCOUNTER — Other Ambulatory Visit: Payer: Self-pay | Admitting: Family

## 2017-04-15 VITALS — BP 128/67 | HR 68 | Temp 97.3°F | Resp 18 | Ht 59.0 in | Wt 84.8 lb

## 2017-04-15 DIAGNOSIS — R944 Abnormal results of kidney function studies: Secondary | ICD-10-CM

## 2017-04-15 DIAGNOSIS — R112 Nausea with vomiting, unspecified: Secondary | ICD-10-CM

## 2017-04-15 DIAGNOSIS — R630 Anorexia: Secondary | ICD-10-CM | POA: Diagnosis not present

## 2017-04-15 DIAGNOSIS — R8290 Unspecified abnormal findings in urine: Secondary | ICD-10-CM

## 2017-04-15 LAB — POCT URINALYSIS DIP (MANUAL ENTRY)
BILIRUBIN UA: NEGATIVE
Blood, UA: NEGATIVE
GLUCOSE UA: NEGATIVE mg/dL
Ketones, POC UA: NEGATIVE mg/dL
Nitrite, UA: NEGATIVE
Protein Ur, POC: NEGATIVE mg/dL
Spec Grav, UA: 1.015 (ref 1.010–1.025)
UROBILINOGEN UA: 1 U/dL
pH, UA: 8.5 — AB (ref 5.0–8.0)

## 2017-04-15 MED ORDER — ONDANSETRON 4 MG PO TBDP: 4.0000 mg | ORAL_TABLET | Freq: Three times a day (TID) | ORAL | 5 refills | Status: DC | PRN

## 2017-04-15 NOTE — Progress Notes (Addendum)
MRN: [id number] DOB: July 01, 1928  Subjective:   Jenny Torres is a 83 y.o. female presenting for follow up on nausea with vomiting, decreased appetite, dehydration. Last OV was 04/08/2017 for same. She was treated with 1L of fluids, advised to continue using Zofran, aggressively hydrating. Step up to solids as tolerated. Work up showed mild decrease in GFR, ketonuria, proteinuria, high specific gravity consistent with dehydration. Today, patient presents and reports feeling much better. Denies fever, n/v, abdominal pain, chest pain, dizziness, decreased appetite. She has been tolerating solid foods, feels like her normal self again. She is not using Zofran currently but is requesting a printed script just in case she needs this in the future.  [name] has a current medication list which includes the following prescription(s): amlodipine, aspirin ec, feeding supplement (ensure enlive), gabapentin, metoprolol tartrate, multivitamin with minerals, ondansetron, sertraline, and zolpidem. Also is allergic to lovastatin; sulfamethoxazole; darvocet [propoxyphene n-acetaminophen]; erythromycin; hydrocod polst-cpm polst er; penicillins; theophyllines; zovirax [acyclovir]; ceftin; and paroxetine hcl.  [name]  has a past medical history of Anorexia; Anxiety; Carotid artery occlusion; COPD (chronic obstructive pulmonary disease) ([place]); Depression; Diverticula, colon; Gastritis; GERD (gastroesophageal reflux disease); Hemorrhoids, external; HTN (hypertension); Hyperlipidemia; Hyperlipidemia; Idiopathic peripheral neuropathy; MVP (mitral valve prolapse); Osteoporosis; Palpitations; Polyp of colon; and Raynaud's disease. Also  has a past surgical history that includes Cholecystectomy; Cesarean section; and Intramedullary (im) nail intertrochanteric (Right, 02/11/2014).  Objective:   Vitals: BP 128/67   Pulse 68   Temp (!) 97.3 F (36.3 C) (Oral)   Resp 18   Ht 4\' 11"  (1.499 m)   Wt 84 lb 12.8 oz (38.5 kg)    SpO2 97%   BMI 17.13 kg/m   BP Readings from Last 3 Encounters:  04/15/17 (!) 128/9  04/08/17 135/85  03/02/17 (!) 163/65   Physical Exam  Constitutional: She is oriented to person, place, and time. She appears well-developed and well-nourished.  HENT:  Mouth/Throat: Oropharynx is clear and moist.  Cardiovascular: Normal rate, regular rhythm and intact distal pulses.  Exam reveals no gallop and no friction rub.   No murmur heard. Pulmonary/Chest: No respiratory distress. She has no wheezes. She has no rales.  Abdominal: Soft. Bowel sounds are normal. She exhibits no distension and no mass. There is no tenderness. There is no guarding.  Neurological: She is alert and oriented to person, place, and time.  Skin: Skin is warm and dry.   Results for orders placed or performed in visit on 04/15/17 (from the past 24 hour(s))  POCT urinalysis dipstick     Status: Abnormal   Collection Time: 04/15/17  3:07 PM  Result Value Ref Range   Color, UA yellow yellow   Clarity, UA clear clear   Glucose, UA negative negative mg/dL   Bilirubin, UA negative negative   Ketones, POC UA negative negative mg/dL   Spec Grav, UA 1.015 1.010 - 1.025   Blood, UA negative negative   pH, UA 8.5 (A) 5.0 - 8.0   Protein Ur, POC negative negative mg/dL   Urobilinogen, UA 1.0 0.2 or 1.0 E.U./dL   Nitrite, UA Negative Negative   Leukocytes, UA Small (1+) (A) Negative   Assessment and Plan :   1. Non-intractable vomiting with nausea, unspecified vomiting type 2. Decreased appetite 3. Decreased GFR 4. Abnormal urinalysis - Significantly improved. Urine culture is pending due to leukocytosis. Patient is asymptomatic. Follow up as needed. Will report results to [phone].  Jaynee Eagles, PA-C [facility] [phone] 04/15/2017 2:36 PM

## 2017-04-15 NOTE — Patient Instructions (Addendum)
Dehydration Dehydration is a condition in which there is not enough fluid or water in the body. This happens when you lose more fluids than you take in. Important organs, such as the kidneys, brain, and heart, cannot function without a proper amount of fluids. Any loss of fluids from the body can lead to dehydration. People age 81 or older have a higher risk of dehydration than younger adults because in older age, the body:  Is less able to conserve water.  Does not respond to temperature changes as well.  Does not get thirsty as easily or quickly.  Dehydration can range from mild to severe. This condition should be treated right away to prevent it from becoming severe. What are the causes? Dehydration may be caused by:  Vomiting.  Diarrhea.  Excessive sweating, such as from heat exposure or exercise.  Not drinking enough fluid, especially: ? When ill. ? While doing activity that requires a lot of energy.  Excessive urination.  Fever.  Infection.  Certain medicines, such as medicines that cause the body to lose excess fluid (diuretics).  Inability to access safe drinking water.  Poorly controlled blood sugars.  Reduced physical ability to get adequate water and food.  What increases the risk? This condition is more likely to develop in people who:  Have a long-term (chronic) illness, such as: ? An illness that may increase urination, such as diabetes. ? Kidney, heart, or lung disease. ? Neurological or psychological disorders, such as dementia.  Are age 74 or older.  Are disabled.  Live in a place with high altitude.  What are the signs or symptoms? Symptoms of mild dehydration may include:  Thirst.  Dry lips.  Slightly dry mouth.  Dry, warm skin.  Dizziness. Symptoms of moderate dehydration may include:  Very dry mouth.  Muscle cramps.  Dark urine. Urine may be the color of tea.  Decreased urine production.  Decreased tear  production.  Heartbeat that is irregular or faster than normal (palpitations).  Headache.  Light-headedness, especially when you stand up from a sitting position.  Fainting (syncope). Symptoms of severe dehydration may include:  Changes in skin, such as: ? Cold and clammy skin. ? Blotchy (mottled) or pale skin. ? Skin that does not quickly return to normal after being lightly pinched and released (poor skin turgor).  Changes in body fluids, such as: ? Extreme thirst. ? No tear production. ? Inability to sweat when body temperature is high, such as in hot weather. ? Very little urine production.  Changes in vital signs, such as: ? Weak pulse. ? Pulse that is more than 100 beats a minute when sitting still. ? Rapid breathing. ? Low blood pressure.  Other changes, such as: ? Sunken eyes. ? Cold hands and feet. ? Confusion. ? Lack of energy (lethargy). ? Difficulty waking up from sleep. ? Short-term weight loss. ? Unconsciousness. How is this diagnosed? This condition is diagnosed based on your symptoms and a physical exam. Blood and urine tests may be done to help confirm the diagnosis. How is this treated? Treatment for this condition depends on the severity. Mild or moderate dehydration can often be treated at home. Treatment should be started right away. Do not wait until dehydration becomes severe. Severe dehydration is an emergency and it needs to be treated in a hospital. Treatment for mild dehydration may include:  Drinking more fluids.  Replacing salts and minerals in your blood (electrolytes). Treatment for moderate dehydration may include:  Drinking an oral  rehydration solution (ORS). This is a drink that helps you replace fluids and electrolytes (rehydrate). It is found at pharmacies and retail stores. Treatment for severe dehydration may include:  Receiving fluids through an IV tube.  Receiving an electrolyte solution through a tube passed through your  nose and into your stomach (nasogastric tube or NG tube).  Correcting abnormalities in electrolytes.  Treating the underlying cause of dehydration. Follow these instructions at home:  If directed by your health care provider, drink an ORS: ? Make an ORS by following instructions on the package. ? Start by drinking small amounts, about  cup (120 mL) every 5-10 minutes. ? Slowly increase how much you drink until you have taken the amount recommended by your health care provider.  Drink enough clear fluid to keep your urine clear or pale yellow. If you were told to drink an ORS, finish the ORS first, then start slowly drinking other clear fluids. Drink fluids such as: ? Water. Do not drink only water. Doing that can lead to having too little salt (sodium) in the body (hyponatremia). ? Ice chips. ? Fruit juice that you have added water to (diluted fruit juice). ? Low-calorie sports drinks.  Avoid: ? Alcohol. ? Drinks that contain a lot of sugar. These include high-calorie sports drinks, fruit juice that is not diluted, and soda. ? Caffeine. ? Foods that are greasy or contain a lot of fat or sugar.  Take over-the-counter and prescription medicines only as told by your health care provider.  Do not take sodium tablets. This can lead to having too much sodium in the body (hypernatremia).  Eat foods that contain a healthy balance of electrolytes, such as bananas, oranges, potatoes, tomatoes, and spinach.  Keep all follow-up visits as told by your health care provider. This is important. Contact a health care provider if:  You have abdominal pain that: ? Gets worse. ? Stays in one area (localizes).  You have a rash.  You have a stiff neck.  You are sleepier, more irritable, or more difficult to wake up than usual.  You feel weak, dizzy, or very thirsty. Get help right away if:  You have: ? Symptoms of severe dehydration. ? A fever. ? A severe headache. ? Vomiting or diarrhea  that gets worse or does not go away. ? Diarrhea for more than 24 hours. ? Blood or green matter (bile) in your vomit. ? Blood in your stool. This may cause stool to look black and tarry. ? Trouble breathing.  You cannot drink fluids without vomiting.  Your symptoms get worse with treatment.  You have not urinated in 6-8 hours.  You have urinated only a small amount of very dark urine over 6-8 hours.  You faint.  Your heart rate while sitting still is over 100 beats a minute. This information is not intended to replace advice given to you by your health care provider. Make sure you discuss any questions you have with your health care provider. Document Released: 11/28/2003 Document Revised: 03/27/2016 Document Reviewed: 11/01/2015 Elsevier Interactive Patient Education  2017 [employer].     IF you received an x-ray today, you will receive an invoice from [facility]. Please contact [facility] Radiology at [phone] with questions or concerns regarding your invoice.   IF you received labwork today, you will receive an invoice from [place]. Please contact [facility] at [phone] with questions or concerns regarding your invoice.   Our billing staff will not be able to assist you with questions  regarding bills from these companies.  You will be contacted with the lab results as soon as they are available. The fastest way to get your results is to activate your My Chart account. Instructions are located on the last page of this paperwork. If you have not heard from [place] regarding the results in 2 weeks, please contact this office.

## 2017-04-16 LAB — BASIC METABOLIC PANEL
BUN / CREAT RATIO: 12 (ref 12–28)
BUN: 8 mg/dL (ref 8–27)
CALCIUM: 9.1 mg/dL (ref 8.7–10.3)
CHLORIDE: 91 mmol/L — AB (ref 96–106)
CO2: 28 mmol/L (ref 20–29)
Creatinine, Ser: 0.65 mg/dL (ref 0.57–1.00)
GFR calc Af Amer: 92 mL/min/{1.73_m2} (ref >59)
GFR calc non Af Amer: 80 mL/min/{1.73_m2} (ref >59)
GLUCOSE: 90 mg/dL (ref 65–99)
Potassium: 4.3 mmol/L (ref 3.5–5.2)
Sodium: 130 mmol/L — ABNORMAL LOW (ref 134–144)

## 2017-04-16 LAB — URINE CULTURE

## 2017-04-16 LAB — CBC
Hematocrit: 36.4 % (ref 34.0–46.6)
Hemoglobin: 11.9 g/dL (ref 11.1–15.9)
MCH: 29.7 pg (ref 26.6–33.0)
MCHC: 32.7 g/dL (ref 31.5–35.7)
MCV: 91 fL (ref 79–97)
PLATELETS: 332 10*3/uL (ref 150–379)
RBC: 4.01 x10E6/uL (ref 3.77–5.28)
RDW: 14.2 % (ref 12.3–15.4)
WBC: 8.8 10*3/uL (ref 3.4–10.8)

## 2017-05-31 ENCOUNTER — Other Ambulatory Visit: Payer: Self-pay | Admitting: Family

## 2017-06-01 NOTE — Telephone Encounter (Signed)
Do not see on patients current Med list. Please advise. thanks

## 2017-06-03 ENCOUNTER — Encounter: Payer: Self-pay | Admitting: Urgent Care

## 2017-06-03 ENCOUNTER — Ambulatory Visit (INDEPENDENT_AMBULATORY_CARE_PROVIDER_SITE_OTHER): Payer: Medicare Other | Admitting: Urgent Care

## 2017-06-03 VITALS — BP 115/65 | HR 67 | Temp 98.0°F | Resp 16 | Ht 61.0 in | Wt 88.8 lb

## 2017-06-03 DIAGNOSIS — G4700 Insomnia, unspecified: Secondary | ICD-10-CM | POA: Diagnosis not present

## 2017-06-03 DIAGNOSIS — R112 Nausea with vomiting, unspecified: Secondary | ICD-10-CM

## 2017-06-03 LAB — POCT CBC
Granulocyte percent: 62.1 %G (ref 37–80)
HEMATOCRIT: 33.4 % — AB (ref 37.7–47.9)
Hemoglobin: 11.3 g/dL — AB (ref 12.2–16.2)
Lymph, poc: 1.9 (ref 0.6–3.4)
MCH: 30.5 pg (ref 27–31.2)
MCHC: 33.8 g/dL (ref 31.8–35.4)
MCV: 90.2 fL (ref 80–97)
MID (CBC): 0.4 (ref 0–0.9)
MPV: 6.3 fL (ref 0–99.8)
POC Granulocyte: 3.7 (ref 2–6.9)
POC LYMPH PERCENT: 32 %L (ref 10–50)
POC MID %: 5.9 % (ref 0–12)
Platelet Count, POC: 280 10*3/uL (ref 142–424)
RBC: 3.7 M/uL — AB (ref 4.04–5.48)
RDW, POC: 12.5 %
WBC: 6 10*3/uL (ref 4.6–10.2)

## 2017-06-03 MED ORDER — HYDROXYZINE HCL 50 MG PO TABS: 50.0000 mg | ORAL_TABLET | Freq: Every day | ORAL | 0 refills | Status: DC

## 2017-06-03 NOTE — Patient Instructions (Addendum)
Hydroxyzine capsules or tablets What is this medicine? HYDROXYZINE (hye [place] i zeen) is an antihistamine. This medicine is used to treat allergy symptoms. It is also used to treat anxiety and tension. This medicine can be used with other medicines to induce sleep before surgery. This medicine may be used for other purposes; ask your health care provider or pharmacist if you have questions. COMMON BRAND NAME(S): ANX, Atarax, Rezine, Vistaril What should I tell my health care provider before I take this medicine? They need to know if you have any of these conditions: -any chronic illness -difficulty passing urine -glaucoma -heart disease -kidney disease -liver disease -lung disease -an unusual or allergic reaction to hydroxyzine, cetirizine, other medicines, foods, dyes, or preservatives -pregnant or trying to get pregnant -breast-feeding How should I use this medicine? Take this medicine by mouth with a full glass of water. Follow the directions on the prescription label. You may take this medicine with food or on an empty stomach. Take your medicine at regular intervals. Do not take your medicine more often than directed. Talk to your pediatrician regarding the use of this medicine in children. Special care may be needed. While this drug may be prescribed for children as young as 75 years of age for selected conditions, precautions do apply. Patients over 62 years old may have a stronger reaction and need a smaller dose. Overdosage: If you think you have taken too much of this medicine contact a poison control center or emergency room at once. NOTE: This medicine is only for you. Do not share this medicine with others. What if I miss a dose? If you miss a dose, take it as soon as you can. If it is almost time for your next dose, take only that dose. Do not take double or extra doses. What may interact with this medicine? -alcohol -barbiturate medicines for sleep or seizures -medicines for  colds, allergies -medicines for depression, anxiety, or emotional disturbances -medicines for pain -medicines for sleep -muscle relaxants This list may not describe all possible interactions. Give your health care provider a list of all the medicines, herbs, non-prescription drugs, or dietary supplements you use. Also tell them if you smoke, drink alcohol, or use illegal drugs. Some items may interact with your medicine. What should I watch for while using this medicine? Tell your doctor or health care professional if your symptoms do not improve. You may get drowsy or dizzy. Do not drive, use machinery, or do anything that needs mental alertness until you know how this medicine affects you. Do not stand or sit up quickly, especially if you are an older patient. This reduces the risk of dizzy or fainting spells. Alcohol may interfere with the effect of this medicine. Avoid alcoholic drinks. Your mouth may get dry. Chewing sugarless gum or sucking hard candy, and drinking plenty of water may help. Contact your doctor if the problem does not go away or is severe. This medicine may cause dry eyes and blurred vision. If you wear contact lenses you may feel some discomfort. Lubricating drops may help. See your eye doctor if the problem does not go away or is severe. If you are receiving skin tests for allergies, tell your doctor you are using this medicine. What side effects may I notice from receiving this medicine? Side effects that you should report to your doctor or health care professional as soon as possible: -fast or irregular heartbeat -difficulty passing urine -seizures -slurred speech or confusion -tremor Side effects that  usually do not require medical attention (report to your doctor or health care professional if they continue or are bothersome): -constipation -drowsiness -fatigue -headache -stomach upset This list may not describe all possible side effects. Call your doctor for  medical advice about side effects. You may report side effects to FDA at 1-800-FDA-1088. Where should I keep my medicine? Keep out of the reach of children. Store at room temperature between 15 and 30 degrees C (59 and 86 degrees F). Keep container tightly closed. Throw away any unused medicine after the expiration date. NOTE: This sheet is a summary. It may not cover all possible information. If you have questions about this medicine, talk to your doctor, pharmacist, or health care provider.  2018 Elsevier/Gold Standard (2008-01-20 14:50:59)     IF you received an x-ray today, you will receive an invoice from [facility]. Please contact [facility]iology at [phone] with questions or concerns regarding your invoice.   IF you received labwork today, you will receive an invoice from [place]. Please contact [facility] at [phone] with questions or concerns regarding your invoice.   Our billing staff will not be able to assist you with questions regarding bills from these companies.  You will be contacted with the lab results as soon as they are available. The fastest way to get your results is to activate your My Chart account. Instructions are located on the last page of this paperwork. If you have not heard from [place] regarding the results in 2 weeks, please contact this office.

## 2017-06-03 NOTE — Progress Notes (Signed)
    MRN: [id number] DOB: 1927-10-20  Subjective:   Jenny KUCHARSKI is a 83 y.o. female presenting for chief complaint of nausea and vomiting (pt states that she doen't want flu shot until Oct.)  Patient is following up regarding her use of Ambien. Reports that she finally decided to stop using this, has not used it for the past 3-4 weeks. She has since stopped having random episodes of nausea with vomiting. However, she still has difficulty with her sleep. Has not slept very much in the past 3 days. Denies confusion, dizziness, headache, weakness.  [name] has a current medication list which includes the following prescription(s): amlodipine, aspirin ec, feeding supplement (ensure enlive), gabapentin, metoprolol tartrate, multivitamin with minerals, ondansetron, sertraline, and zolpidem. Also is allergic to lovastatin; sulfamethoxazole; darvocet [propoxyphene n-acetaminophen]; erythromycin; hydrocod polst-cpm polst er; penicillins; theophyllines; zovirax [acyclovir]; ceftin; and paroxetine hcl.  [name]  has a past medical history of Anorexia; Anxiety; Carotid artery occlusion; COPD (chronic obstructive pulmonary disease) ([place]); Depression; Diverticula, colon; Gastritis; GERD (gastroesophageal reflux disease); Hemorrhoids, external; HTN (hypertension); Hyperlipidemia; Hyperlipidemia; Idiopathic peripheral neuropathy; MVP (mitral valve prolapse); Osteoporosis; Palpitations; Polyp of colon; and Raynaud's disease. Also  has a past surgical history that includes Cholecystectomy; Cesarean section; and Intramedullary (im) nail intertrochanteric (Right, 02/11/2014).  Objective:   Vitals: BP 115/65   Pulse 67   Temp 98 F (36.7 C) (Oral)   Resp 16   Ht 5\' 1"  (1.549 m)   Wt 88 lb 12.8 oz (40.3 kg)   SpO2 94%   BMI 16.78 kg/m   Physical Exam  Constitutional: She is oriented to person, place, and time. She appears well-developed and well-nourished.  Cardiovascular: Normal rate.   Pulmonary/Chest:  Effort normal.  Neurological: She is alert and oriented to person, place, and time.  Psychiatric: She has a normal mood and affect.   Assessment and Plan :   1. Insomnia, unspecified type 2. Non-intractable vomiting with nausea, unspecified vomiting type - I re-emphasized that it was a good decision to stop using Ambien. Patient will start trial of Vistaril. Follow up in 2 weeks, consider using Trazodone if she fails Vistaril.  Jaynee Eagles, PA-C Primary [facility] at [facility] [phone] 06/03/2017  2:07 PM

## 2017-06-07 ENCOUNTER — Telehealth: Payer: Self-pay | Admitting: Urgent Care

## 2017-06-07 NOTE — Telephone Encounter (Signed)
Pt is letting mani know that she has discontinued taking the hydroxyzine due to a reaction  Best number [phone]

## 2017-06-09 ENCOUNTER — Telehealth: Payer: Self-pay | Admitting: Urgent Care

## 2017-06-09 NOTE — Telephone Encounter (Signed)
Pt called again.  She is having a very hard time sleeping and wants to know if you want to try another medication.

## 2017-06-09 NOTE — Telephone Encounter (Signed)
error 

## 2017-07-08 ENCOUNTER — Encounter: Payer: Self-pay | Admitting: Internal Medicine

## 2017-07-08 ENCOUNTER — Ambulatory Visit (INDEPENDENT_AMBULATORY_CARE_PROVIDER_SITE_OTHER): Payer: Medicare Other | Admitting: Internal Medicine

## 2017-07-08 DIAGNOSIS — F5101 Primary insomnia: Secondary | ICD-10-CM | POA: Diagnosis not present

## 2017-07-08 MED ORDER — ZALEPLON 5 MG PO CAPS: 5.0000 mg | ORAL_CAPSULE | Freq: Every evening | ORAL | 0 refills | Status: DC | PRN

## 2017-07-08 NOTE — Progress Notes (Signed)
   Subjective:    Patient ID: Jenny Torres, female    DOB: [birth year], 83 y.o.   MRN: [id number]  HPI The patient is an 83 YO female coming in for insomnia. She previously was taking [place] for a long time. Recently in the last 1 year with stomach and GI symptoms such as bloating, cramping, and nausea with vomiting. She was unable to have an explanation and was advised to stop [place] as it could be related. She stopped taking ambien and is now fully resolved with her GI symptoms. She needs a replacement for the [place] and asked the pharmacist and they recommended sonata. She is willing to try that. Without anything she will sleep about 3 hours per night with some nights she only sleeps less than 1 hour. Does not nap during the day.   Review of Systems  Constitutional: Negative.   Respiratory: Negative for cough, chest tightness and shortness of breath.   Cardiovascular: Negative for chest pain, palpitations and leg swelling.  Gastrointestinal: Negative for abdominal distention, abdominal pain, constipation, diarrhea, nausea and vomiting.  Musculoskeletal: Negative.   Skin: Negative.   Psychiatric/Behavioral: Positive for sleep disturbance. Negative for behavioral problems, hallucinations, self-injury and suicidal ideas.      Objective:   Physical Exam  Constitutional: She is oriented to person, place, and time. She appears well-developed and well-nourished.  HENT:  Head: Normocephalic and atraumatic.  Eyes: EOM are normal.  Neck: Normal range of motion.  Cardiovascular: Normal rate and regular rhythm.   Pulmonary/Chest: Effort normal and breath sounds normal. No respiratory distress. She has no wheezes. She has no rales.  Abdominal: Soft. Bowel sounds are normal. She exhibits no distension. There is no tenderness. There is no rebound.  Musculoskeletal: She exhibits no edema.  Neurological: She is alert and oriented to person, place, and time. Coordination normal.  Skin: Skin is warm  and dry.  Psychiatric: She has a normal mood and affect.   Vitals:   07/08/17 1317  BP: 118/70  Pulse: 64  Temp: 97.6 F (36.4 C)  TempSrc: Oral  SpO2: 98%  Weight: 88 lb (39.9 kg)  Height: 5\' 1"  (1.549 m)     Assessment & Plan:

## 2017-07-08 NOTE — Patient Instructions (Signed)
We will have you stop taking zoloft. Start off taking 1/2 pill daily for 2 weeks then stop taking the medicine. If you notice any worsening depression call the office.   We have given you the sonata (zalepton) to try for sleep. Take 1 pill at night time for 1 week to see if it works.

## 2017-07-09 NOTE — Assessment & Plan Note (Signed)
She is not able to take ambien due to side effects. Will try sonata 2 week supply locally and if effective send to mail order. Advised about the risks and benefits of this new medication.

## 2017-07-25 ENCOUNTER — Other Ambulatory Visit: Payer: Self-pay | Admitting: Internal Medicine

## 2017-07-27 ENCOUNTER — Emergency Department (HOSPITAL_COMMUNITY)
Admission: EM | Admit: 2017-07-27 | Discharge: 2017-07-27 | Disposition: A | Discharge status: Home / Self Care | Payer: Medicare Other | Attending: Emergency Medicine | Admitting: Emergency Medicine

## 2017-07-27 ENCOUNTER — Encounter (HOSPITAL_COMMUNITY): Payer: Self-pay | Admitting: Emergency Medicine

## 2017-07-27 ENCOUNTER — Emergency Department (HOSPITAL_COMMUNITY): Payer: Medicare Other

## 2017-07-27 DIAGNOSIS — Z79899 Other long term (current) drug therapy: Secondary | ICD-10-CM | POA: Diagnosis not present

## 2017-07-27 DIAGNOSIS — J449 Chronic obstructive pulmonary disease, unspecified: Secondary | ICD-10-CM | POA: Insufficient documentation

## 2017-07-27 DIAGNOSIS — I2510 Atherosclerotic heart disease of native coronary artery without angina pectoris: Secondary | ICD-10-CM | POA: Diagnosis not present

## 2017-07-27 DIAGNOSIS — B349 Viral infection, unspecified: Secondary | ICD-10-CM | POA: Insufficient documentation

## 2017-07-27 DIAGNOSIS — Z7982 Long term (current) use of aspirin: Secondary | ICD-10-CM | POA: Diagnosis not present

## 2017-07-27 DIAGNOSIS — Z87891 Personal history of nicotine dependence: Secondary | ICD-10-CM | POA: Diagnosis not present

## 2017-07-27 DIAGNOSIS — K529 Noninfective gastroenteritis and colitis, unspecified: Secondary | ICD-10-CM | POA: Diagnosis not present

## 2017-07-27 DIAGNOSIS — I10 Essential (primary) hypertension: Secondary | ICD-10-CM | POA: Insufficient documentation

## 2017-07-27 DIAGNOSIS — R112 Nausea with vomiting, unspecified: Secondary | ICD-10-CM | POA: Diagnosis present

## 2017-07-27 LAB — CBC
HEMATOCRIT: 40.6 % (ref 36.0–46.0)
HEMOGLOBIN: 14.2 g/dL (ref 12.0–15.0)
MCH: 30.9 pg (ref 26.0–34.0)
MCHC: 35 g/dL (ref 30.0–36.0)
MCV: 88.3 fL (ref 78.0–100.0)
Platelets: 291 10*3/uL (ref 150–400)
RBC: 4.6 MIL/uL (ref 3.87–5.11)
RDW: 13.1 % (ref 11.5–15.5)
WBC: 18.2 10*3/uL — AB (ref 4.0–10.5)

## 2017-07-27 LAB — COMPREHENSIVE METABOLIC PANEL
ALBUMIN: 4.4 g/dL (ref 3.5–5.0)
ALT: 15 U/L (ref 14–54)
AST: 27 U/L (ref 15–41)
Alkaline Phosphatase: 105 U/L (ref 38–126)
Anion gap: 16 — ABNORMAL HIGH (ref 5–15)
BILIRUBIN TOTAL: 2.3 mg/dL — AB (ref 0.3–1.2)
BUN: 38 mg/dL — AB (ref 6–20)
CHLORIDE: 101 mmol/L (ref 101–111)
CO2: 24 mmol/L (ref 22–32)
CREATININE: 0.82 mg/dL (ref 0.44–1.00)
Calcium: 9.7 mg/dL (ref 8.9–10.3)
GFR calc Af Amer: >60 mL/min (ref >60)
GLUCOSE: 117 mg/dL — AB (ref 65–99)
POTASSIUM: 3 mmol/L — AB (ref 3.5–5.1)
Sodium: 141 mmol/L (ref 135–145)
TOTAL PROTEIN: 7.6 g/dL (ref 6.5–8.1)

## 2017-07-27 LAB — URINALYSIS, ROUTINE W REFLEX MICROSCOPIC
BILIRUBIN URINE: NEGATIVE
Glucose, UA: NEGATIVE mg/dL
HGB URINE DIPSTICK: NEGATIVE
Ketones, ur: 80 mg/dL — AB
Nitrite: NEGATIVE
PROTEIN: 100 mg/dL — AB
SPECIFIC GRAVITY, URINE: 1.026 (ref 1.005–1.030)
pH: 5 (ref 5.0–8.0)

## 2017-07-27 LAB — LIPASE, BLOOD: LIPASE: 30 U/L (ref 11–51)

## 2017-07-27 MED ORDER — SODIUM CHLORIDE 0.9 % IV BOLUS (SEPSIS)
500.0000 mL | Freq: Once | INTRAVENOUS | Status: AC
  Administered 2017-07-27: 13:00:00 via INTRAVENOUS

## 2017-07-27 MED ORDER — IOPAMIDOL (ISOVUE-300) INJECTION 61%
100.0000 mL | Freq: Once | INTRAVENOUS | Status: AC | PRN
  Administered 2017-07-27: 80 mL via INTRAVENOUS

## 2017-07-27 MED ORDER — IOPAMIDOL (ISOVUE-300) INJECTION 61%
INTRAVENOUS | Status: AC
  Filled 2017-07-27: qty 100

## 2017-07-27 NOTE — ED Notes (Signed)
Bed: WA01 Expected date:  Expected time:  Means of arrival:  Comments: EMS-multiple complaints

## 2017-07-27 NOTE — Discharge Instructions (Signed)
Please read and follow all provided instructions.  Your diagnoses today include:  1. Viral syndrome   2. Gastroenteritis    Tests performed today include: Vital signs. See below for your results today.   Medications prescribed:  Take as prescribed   Home care instructions:  Follow any educational materials contained in this packet.  Follow-up instructions: Please follow-up with your primary care provider for further evaluation of symptoms and treatment   Return instructions:  Please return to the Emergency Department if you do not get better, if you get worse, or new symptoms OR  - Fever (temperature greater than 101.14F)  - Bleeding that does not stop with holding pressure to the area    -Severe pain (please note that you may be more sore the day after your accident)  - Chest Pain  - Difficulty breathing  - Severe nausea or vomiting  - Inability to tolerate food and liquids  - Passing out  - Skin becoming red around your wounds  - Change in mental status (confusion or lethargy)  - New numbness or weakness    Please return if you have any other emergent concerns.  Additional Information:  Your vital signs today were: BP (!) 152/63    Pulse (!) 107    Temp 97.7 F (36.5 C) (Oral)    Resp (!) 23    SpO2 94%  If your blood pressure (BP) was elevated above 135/85 this visit, please have this repeated by your doctor within one month. ---------------

## 2017-07-27 NOTE — ED Notes (Signed)
[facility] is aware of pt coming back to the facility.

## 2017-07-27 NOTE — ED Triage Notes (Addendum)
Per EMS, patient from [facility], c/o N/V since Sunday. Reports generalized weakness since today. + orthostatics.  2og L AC 4mg  Zofran and 548ml NS with EMS  HR 100 O2 96% CBG 154 BP 140/72

## 2017-07-27 NOTE — ED Notes (Signed)
Waiting on ptar.

## 2017-07-27 NOTE — ED Provider Notes (Signed)
[facility] DEPT Provider Note   CSN: [id number] Arrival date & time: 07/27/17  1140     History   Chief Complaint Chief Complaint  Patient presents with  . Emesis    HPI Jenny Torres is a 83 y.o. female.  HPI  83 y.o. female with a hx of COPD, HTN, HLD, presents to the Emergency Department today vis EMS from [place] due to N/V since Sunday. Pt was given Zofran and fluids en route via EMS. Positive Orthostatics on scene. Notes generalized weakness. States that she has been unable to eat anything Since Sunday due to feeling a lack of appetite. Denies abdominal pain. Notes regular bowel movements. Endorsed diarrhea yesterday, but since resolved. No CP/SOB. No headaches. No fevers. No dysuria. Notes that she has not thrown up since yesterday morning. Pt was sent here due to weakness. No other symptoms noted.      Past Medical History:  Diagnosis Date  . Anorexia   . Anxiety   . Carotid artery occlusion   . COPD (chronic obstructive pulmonary disease) ([place])   . Depression   . Diverticula, colon   . Gastritis   . GERD (gastroesophageal reflux disease)   . Hemorrhoids, external   . HTN (hypertension)   . Hyperlipidemia   . Hyperlipidemia   . Idiopathic peripheral neuropathy    chronic  . MVP (mitral valve prolapse)    stable  . Osteoporosis   . Palpitations   . Polyp of colon   . Raynaud's disease     Patient Active Problem List   Diagnosis Date Noted  . Physical deconditioning   . Pressure injury of skin 09/15/2016  . Nausea and vomiting 09/14/2016  . SIRS (systemic inflammatory response syndrome) ([place]) 09/14/2016  . Dysuria 09/08/2016  . Syncope and collapse 12/02/2014  . Hyperlipidemia 12/02/2014  . COPD (chronic obstructive pulmonary disease) ([place]) 12/02/2014  . Essential hypertension 12/02/2014  . Insomnia 02/18/2014  . Protein-calorie malnutrition, severe ([place]) 02/12/2014  . Low vitamin B12  level 12/29/2012  . Pulmonary nodule 10/21/2012  . Idiopathic peripheral neuropathy   . MVP (mitral valve prolapse)   . Osteoporosis   . Raynaud's disease 04/24/2011  . Anxiety state 11/10/2007  . Depression 11/10/2007    Past Surgical History:  Procedure Laterality Date  . CESAREAN SECTION     x2  . CHOLECYSTECTOMY      OB History    No data available       Home Medications    Prior to Admission medications   Medication Sig Start Date End Date Taking? Authorizing Provider  amLODipine (NORVASC) 2.5 MG tablet TAKE 1 TABLET DAILY 12/31/16  Yes Hoyt Koch, MD  aspirin EC 81 MG tablet Take 81 mg by mouth daily.   Yes [provider]  feeding supplement, ENSURE ENLIVE, (ENSURE ENLIVE) LIQD Take 237 mLs by mouth 3 (three) times daily between meals. 09/17/16  Yes Barton Dubois, MD  gabapentin (NEURONTIN) 800 MG tablet TAKE 1 TABLET TWICE A DAY 01/29/17  Yes Hoyt Koch, MD  metoprolol tartrate (LOPRESSOR) 25 MG tablet Take 0.5 tablets (12.5 mg total) 2 (two) times daily by mouth. Need annual visit for further refills 07/26/17  Yes Hoyt Koch, MD  ondansetron (ZOFRAN ODT) 4 MG disintegrating tablet Take 1 tablet (4 mg total) by mouth every 8 (eight) hours as needed for nausea or vomiting. 04/15/17  Yes Jaynee Eagles, PA-C  sertraline (ZOLOFT) 25 MG tablet Take  1 tablet (25 mg total) by mouth daily. 09/28/16  Yes Hoyt Koch, MD  zaleplon (SONATA) 5 MG capsule Take 1 capsule (5 mg total) by mouth at bedtime as needed for sleep. Patient not taking: Reported on 07/27/2017 07/08/17   Hoyt Koch, MD    Family History Family History  Problem Relation Age of Onset  . Breast cancer Mother   . Liver disease Brother   . Cirrhosis Brother     Social History Social History   Tobacco Use  . Smoking status: Former Smoker    Packs/day: 1.00    Years: 25.00    Pack years: 25.00    Types: Cigarettes    Last attempt to quit: 09/22/1983      Years since quitting: 33.8  . Smokeless tobacco: Never Used  Substance Use Topics  . Alcohol use: Yes    Comment: rarely  . Drug use: No     Allergies   Lovastatin; Sulfamethoxazole; Darvocet [propoxyphene n-acetaminophen]; Erythromycin; Hydrocod polst-cpm polst er; Penicillins; Theophyllines; Zovirax [acyclovir]; Ceftin; and Paroxetine hcl   Review of Systems Review of Systems ROS reviewed and all are negative for acute change except as noted in the HPI.  Physical Exam Updated Vital Signs BP (!) 158/81 (BP Location: Right Arm)   Pulse (!) 104   Temp 97.7 F (36.5 C) (Oral)   Resp 18   SpO2 99%   Physical Exam  Constitutional: She is oriented to person, place, and time. She appears well-developed and well-nourished. No distress.  HENT:  Head: Normocephalic and atraumatic.  Right Ear: Tympanic membrane, external ear and ear canal normal.  Left Ear: Tympanic membrane, external ear and ear canal normal.  Nose: Nose normal.  Mouth/Throat: Uvula is midline, oropharynx is clear and moist and mucous membranes are normal. No trismus in the jaw. No oropharyngeal exudate, posterior oropharyngeal erythema or tonsillar abscesses.  Dry mucous membranes   Eyes: EOM are normal. Pupils are equal, round, and reactive to light.  Neck: Normal range of motion. Neck supple. No tracheal deviation present.  Cardiovascular: Regular rhythm, S1 normal, S2 normal, normal heart sounds, intact distal pulses and normal pulses. Tachycardia present.  Pulmonary/Chest: Effort normal and breath sounds normal. No respiratory distress. She has no decreased breath sounds. She has no wheezes. She has no rhonchi. She has no rales.  Abdominal: Normal appearance and bowel sounds are normal. There is no tenderness. There is no rigidity, no rebound, no guarding, no CVA tenderness, no tenderness at McBurney's point and negative Murphy's sign.  Musculoskeletal: Normal range of motion.  Neurological: She is alert and  oriented to person, place, and time.  Skin: Skin is warm and dry.  Psychiatric: She has a normal mood and affect. Her speech is normal and behavior is normal. Thought content normal.  Nursing note and vitals reviewed.  ED Treatments / Results  Labs (all labs ordered are listed, but only abnormal results are displayed) Labs Reviewed  CBC - Abnormal; Notable for the following components:      Result Value   WBC 18.2 (*)    All other components within normal limits  COMPREHENSIVE METABOLIC PANEL - Abnormal; Notable for the following components:   Potassium 3.0 (*)    Glucose, Bld 117 (*)    BUN 38 (*)    Total Bilirubin 2.3 (*)    Anion gap 16 (*)    All other components within normal limits  URINALYSIS, ROUTINE W REFLEX MICROSCOPIC - Abnormal; Notable for the following  components:   Ketones, ur 80 (*)    Protein, ur 100 (*)    Leukocytes, UA TRACE (*)    Bacteria, UA RARE (*)    Squamous Epithelial / LPF 0-5 (*)    All other components within normal limits  LIPASE, BLOOD    EKG  EKG Interpretation None       Radiology No results found.  Procedures Procedures (including critical care time)  Medications Ordered in ED Medications  iopamidol (ISOVUE-300) 61 % injection (not administered)  sodium chloride 0.9 % bolus 500 mL (0 mLs Intravenous Stopped 07/27/17 1646)  iopamidol (ISOVUE-300) 61 % injection 100 mL (80 mLs Intravenous Contrast Given 07/27/17 1653)     Initial Impression / Assessment and Plan / ED Course  I have reviewed the triage vital signs and the nursing notes.  Pertinent labs & imaging results that were available during my care of the patient were reviewed by me and considered in my medical decision making (see chart for details).  Final Clinical Impressions(s) / ED Diagnoses  {I have reviewed and evaluated the relevant laboratory values. {I have reviewed and evaluated the relevant imaging studies.  {I have reviewed the relevant previous healthcare  records.  {I obtained HPI from historian. {Patient discussed with supervising physician.  ED Course:  Assessment: Pt is a 83 y.o. female with a hx of COPD, HTN, HLD, presents to the Emergency Department today vis EMS from [place] due to N/V since Sunday. Notes generalized weakness. States that she has been unable to eat anything Since Sunday due to feeling a lack of appetite. Denies abdominal pain. Notes regular bowel movements. Endorsed diarrhea yesterday, but since resolved. No CP/SOB. No headaches. No fevers. No dysuria. Notes that she has not thrown up since yesterday morning. Pt was sent here due to weakness. On exam, pt in NAD. Nontoxic/nonseptic appearing. VSS. Afebrile. Lungs CTA. Heart RRR. Abdomen nontender soft. Positive Orthostatics. CBC with leukocytosis 18. BMP mild hypokalemia. Lipase negative. UA unremarkable. CT Abdomen/Pelvis unremarkable for acute pathology. Likely viral etiology. Given fluids in ED. Seen by supervising physician. Plan is to [place] with follow up to PCP. At time of discharge, Patient is in no acute distress. Vital Signs are stable. Patient is able to ambulate. Patient able to tolerate PO.   Disposition/Plan:  DC Home Additional Verbal discharge instructions given and discussed with patient.  Pt Instructed to f/u with PCP in the next week for evaluation and treatment of symptoms. Return precautions given Pt acknowledges and agrees with plan  Supervising Physician Jola Schmidt, MD  Final diagnoses:  Viral syndrome  Gastroenteritis    ED Discharge Orders    None       Shary Decamp, PA-C 07/27/17 [place], MD 07/28/17 2043

## 2017-08-06 ENCOUNTER — Telehealth: Payer: Self-pay | Admitting: Internal Medicine

## 2017-08-06 NOTE — Telephone Encounter (Signed)
[facility] calling to follow up on a fax to see if we have received it.   Orders for PT, OT & Nursing   You can call and give verbals to: Tamsen Meek [phone]

## 2017-08-06 NOTE — Telephone Encounter (Signed)
Faxed orders

## 2017-08-06 NOTE — Telephone Encounter (Signed)
Okay for verbals °

## 2017-08-10 ENCOUNTER — Telehealth: Payer: Self-pay | Admitting: Internal Medicine

## 2017-08-10 NOTE — Telephone Encounter (Signed)
FYI: Home health has received the referral and will be going out to see the patient on Friday 08/13/17.

## 2017-08-10 NOTE — Telephone Encounter (Signed)
Error

## 2017-08-17 ENCOUNTER — Telehealth: Payer: Self-pay | Admitting: Internal Medicine

## 2017-08-17 NOTE — Telephone Encounter (Signed)
Copied from [place] [phone]. Topic: Inquiry >> Aug 17, 2017  2:49 PM Bea Graff, NT wrote: Reason for CRM: Neoma Laming from [facility] calling and states this pt has refused their care. She has hired personal care workers at [employer] to take care of her and no longer needs [facility]. CB#: [phone].

## 2017-09-03 ENCOUNTER — Ambulatory Visit: Payer: Self-pay | Admitting: *Deleted

## 2017-09-03 NOTE — Telephone Encounter (Addendum)
Pt complains of swelling in legs,feet and ankles for 2 weeks; see leg swelling and edema protocol; she states that she can wear her shoes and would like an appointment next week in the afternoon; pt offered and accepted appointment w with Jodi Mourning at 1140 on 09/06/17; pt verbalizes understanding; will route to [facility] pool for notification of this upcoming appointment.  Reason for Disposition . [1] Very swollen joint AND [2] no fever . [1] MILD swelling of both ankles (i.e., pedal edema) AND [2] new onset or worsening  Answer Assessment - Initial Assessment Questions 1. LOCATION: "Which joint is swollen?"     Both ankles 2. ONSET: "When did the swelling start?"     Started 2 weeks ago  3. SIZE: "How large is the swelling?"     Right leg more swollen than left; says "skin gets tight like they are gonna pop open" 4. PAIN: "Is there any pain?" If so, ask: "How bad is it?" (Scale 1-10; or mild, moderate, severe)     No just skin feels uncomfortable 5. CAUSE: "What do you think caused the swollen joint?"     unsure 6. OTHER SYMPTOMS: "Do you have any other symptoms?" (e.g., fever, chest pain, difficulty breathing, calf pain)     no 7. PREGNANCY: "Is there any chance you are pregnant?" "When was your last menstrual period?"     n/a  Answer Assessment - Initial Assessment Questions 1. ONSET: "When did the swelling start?" (e.g., minutes, hours, days)      2 weeks ago 2. LOCATION: "What part of the leg is swollen?"  "Are both legs swollen or just one leg?"  both legs 3. SEVERITY: "How bad is the swelling?" (e.g., localized; mild, moderate, severe)  - Localized - small area of swelling localized to one leg  - MILD pedal edema - swelling limited to foot and ankle, pitting edema < 1/4 inch (6 mm) deep, rest and elevation eliminate most or all swelling  - MODERATE edema - swelling of lower leg to knee, pitting edema > 1/4 inch (6 mm) deep, rest and elevation only partially reduce swelling  -  SEVERE edema - swelling extends above knee, facial or hand swelling present      moderate 4. REDNESS: "Does the swelling look red or infected?"     no 5. PAIN: "Is the swelling painful to touch?" If so, ask: "How painful is it?"   (Scale 1-10; mild, moderate or severe)     No pain 6. FEVER: "Do you have a fever?" If so, ask: "What is it, how was it measured, and when did it start?"      no 7. CAUSE: "What do you think is causing the leg swelling?"     unsure 8. MEDICAL HISTORY: "Do you have a history of heart failure, kidney disease, liver failure, or cancer?"     no 9. RECURRENT SYMPTOM: "Have you had leg swelling before?" If so, ask: "When was the last time?" "What happened that time?"     Previously had problem years ago but Dr Mare Ferrari watched it and it went away 50. OTHER SYMPTOMS: "Do you have any other symptoms?" (e.g., chest pain, difficulty breathing)       no 11. PREGNANCY: "Is there any chance you are pregnant?" "When was your last menstrual period?"       no  Protocols used: ANKLE SWELLING-A-AH, LEG SWELLING AND EDEMA-A-AH

## 2017-09-03 NOTE — Telephone Encounter (Signed)
noted 

## 2017-09-06 ENCOUNTER — Ambulatory Visit: Payer: Medicare Other | Admitting: Family

## 2017-09-06 ENCOUNTER — Encounter: Payer: Self-pay | Admitting: Family

## 2017-09-06 ENCOUNTER — Other Ambulatory Visit (INDEPENDENT_AMBULATORY_CARE_PROVIDER_SITE_OTHER): Payer: Medicare Other

## 2017-09-06 ENCOUNTER — Other Ambulatory Visit: Payer: Self-pay | Admitting: Family

## 2017-09-06 ENCOUNTER — Ambulatory Visit (INDEPENDENT_AMBULATORY_CARE_PROVIDER_SITE_OTHER)
Admission: RE | Admit: 2017-09-06 | Discharge: 2017-09-06 | Disposition: A | Discharge status: Home / Self Care | Payer: Medicare Other | Source: Ambulatory Visit | Attending: Family | Admitting: Family

## 2017-09-06 VITALS — BP 118/62 | HR 72 | Temp 97.9°F | Wt 85.0 lb

## 2017-09-06 DIAGNOSIS — G4700 Insomnia, unspecified: Secondary | ICD-10-CM

## 2017-09-06 DIAGNOSIS — R600 Localized edema: Secondary | ICD-10-CM

## 2017-09-06 LAB — COMPREHENSIVE METABOLIC PANEL
ALT: 10 U/L (ref 0–35)
AST: 20 U/L (ref 0–37)
Albumin: 3.8 g/dL (ref 3.5–5.2)
Alkaline Phosphatase: 104 U/L (ref 39–117)
BUN: 9 mg/dL (ref 6–23)
CHLORIDE: 97 meq/L (ref 96–112)
CO2: 31 meq/L (ref 19–32)
CREATININE: 0.58 mg/dL (ref 0.40–1.20)
Calcium: 9.2 mg/dL (ref 8.4–10.5)
GFR: 104.39 mL/min (ref >60.00)
GLUCOSE: 77 mg/dL (ref 70–99)
POTASSIUM: 4 meq/L (ref 3.5–5.1)
SODIUM: 134 meq/L — AB (ref 135–145)
Total Bilirubin: 0.8 mg/dL (ref 0.2–1.2)
Total Protein: 6.7 g/dL (ref 6.0–8.3)

## 2017-09-06 LAB — CBC
HEMATOCRIT: 37.5 % (ref 36.0–46.0)
HEMOGLOBIN: 12.5 g/dL (ref 12.0–15.0)
MCHC: 33.4 g/dL (ref 30.0–36.0)
MCV: 91 fl (ref 78.0–100.0)
Platelets: 299 10*3/uL (ref 150.0–400.0)
RBC: 4.12 Mil/uL (ref 3.87–5.11)
RDW: 13.5 % (ref 11.5–15.5)
WBC: 7.5 10*3/uL (ref 4.0–10.5)

## 2017-09-06 LAB — BRAIN NATRIURETIC PEPTIDE: PRO B NATRI PEPTIDE: 88 pg/mL (ref 0.0–100.0)

## 2017-09-06 MED ORDER — FUROSEMIDE 20 MG PO TABS: 10.0000 mg | ORAL_TABLET | Freq: Every day | ORAL | 0 refills | Status: DC

## 2017-09-06 NOTE — Progress Notes (Signed)
Jenny Torres is a 83 y.o. female with the following history as recorded in EpicCare:  Patient Active Problem List   Diagnosis Date Noted  . Physical deconditioning   . Pressure injury of skin 09/15/2016  . Nausea and vomiting 09/14/2016  . SIRS (systemic inflammatory response syndrome) ([place]) 09/14/2016  . Dysuria 09/08/2016  . Syncope and collapse 12/02/2014  . Hyperlipidemia 12/02/2014  . COPD (chronic obstructive pulmonary disease) ([place]) 12/02/2014  . Essential hypertension 12/02/2014  . Insomnia 02/18/2014  . Protein-calorie malnutrition, severe ([place]) 02/12/2014  . Low vitamin B12 level 12/29/2012  . Pulmonary nodule 10/21/2012  . Idiopathic peripheral neuropathy   . MVP (mitral valve prolapse)   . Osteoporosis   . Raynaud's disease 04/24/2011  . Anxiety state 11/10/2007  . Depression 11/10/2007    Current Outpatient Medications  Medication Sig Dispense Refill  . amLODipine (NORVASC) 2.5 MG tablet TAKE 1 TABLET DAILY 90 tablet 3  . aspirin EC 81 MG tablet Take 81 mg by mouth daily.    . feeding supplement, ENSURE ENLIVE, (ENSURE ENLIVE) LIQD Take 237 mLs by mouth 3 (three) times daily between meals. 14220 mL 5  . gabapentin (NEURONTIN) 800 MG tablet TAKE 1 TABLET TWICE A DAY 180 tablet 1  . metoprolol tartrate (LOPRESSOR) 25 MG tablet Take 0.5 tablets (12.5 mg total) 2 (two) times daily by mouth. Need annual visit for further refills 90 tablet 0  . ondansetron (ZOFRAN ODT) 4 MG disintegrating tablet Take 1 tablet (4 mg total) by mouth every 8 (eight) hours as needed for nausea or vomiting. 15 tablet 5  . sertraline (ZOLOFT) 25 MG tablet Take 1 tablet (25 mg total) by mouth daily. 90 tablet 3  . zaleplon (SONATA) 5 MG capsule Take 1 capsule (5 mg total) by mouth at bedtime as needed for sleep. 14 capsule 0   No current facility-administered medications for this visit.     Allergies: Lovastatin; Sulfamethoxazole; Darvocet [propoxyphene n-acetaminophen]; Erythromycin;  Hydrocod polst-cpm polst er; Penicillins; Theophyllines; Zovirax [acyclovir]; Ceftin; and Paroxetine hcl  Past Medical History:  Diagnosis Date  . Anorexia   . Anxiety   . Carotid artery occlusion   . COPD (chronic obstructive pulmonary disease) ([place])   . Depression   . Diverticula, colon   . Gastritis   . GERD (gastroesophageal reflux disease)   . Hemorrhoids, external   . HTN (hypertension)   . Hyperlipidemia   . Hyperlipidemia   . Idiopathic peripheral neuropathy    chronic  . MVP (mitral valve prolapse)    stable  . Osteoporosis   . Palpitations   . Polyp of colon   . Raynaud's disease     Past Surgical History:  Procedure Laterality Date  . CESAREAN SECTION     x2  . CHOLECYSTECTOMY    . INTRAMEDULLARY (IM) NAIL INTERTROCHANTERIC Right 02/11/2014    Family History  Problem Relation Age of Onset  . Breast cancer Mother   . Liver disease Brother   . Cirrhosis Brother     Social History   Tobacco Use  . Smoking status: Former Smoker    Packs/day: 1.00    Years: 25.00    Pack years: 25.00    Types: Cigarettes    Last attempt to quit: 09/22/1983    Years since quitting: 33.9  . Smokeless tobacco: Never Used  Substance Use Topics  . Alcohol use: Yes    Comment: rarely    Subjective:  Patient presents with 2 week history of swelling in  her ankles/ feet ( R is greater than left); notes that symptoms have progressively been worsening; admits that she eats a lot of salt and does not move around regularly- " I sit a lot these days."; denies any chest pain or shortness of breath or wheezing; has been stable on current dosage of Amlodipine with no problems with swelling previously. Weight is actually down 3 pounds compared to last OV in October 2018; notes that she does not drink Ensure regularly;  Also notes she is having a lot of difficulty sleeping since having to stop Ambien- did not feel that Chapman worked and thought it caused same symptoms that forced her to stop  Ambien.    Objective:  Vitals:   09/06/17 1129  BP: 118/62  Pulse: 72  Temp: 97.9 F (36.6 C)  SpO2: 98%  Weight: 85 lb (38.6 kg)    General: Well developed, well nourished, in no acute distress  Skin : Warm and dry.  Head: Normocephalic and atraumatic  Eyes: Sclera and conjunctiva clear; pupils round and reactive to light; extraocular movements intact  Lungs: Respirations unlabored; clear to auscultation bilaterally without wheeze, rales, rhonchi  CVS exam: normal rate and regular rhythm.  Musculoskeletal: No deformities; no active joint inflammation  Extremities: + bilateral pedal 1+ pitting edema, some erythema noted Vessels: Symmetric bilaterally  Neurologic: Alert and oriented; speech intact; face symmetrical; moves all extremities well; CNII-XII intact without focal deficit  Assessment:  1. Pedal edema   2. Insomnia, unspecified type     Plan:  1. Will check CBC, CMP, BNP and CXR today; encouraged patient to limit salt intake and try to move more regularly; in reviewing her recent labs, her K+ was quite low when she was at ER in early November; she understands that I will make a decision about appropriate diuretic once I get her CMP back; follow-up to be determined. 2. Patient could not tolerated Vistaril or Trazodone; I asked her to try cutting her Sonata in 1/2 to see if it could offer benefit and limit side effects.  Return if symptoms worsen or fail to improve.  Orders Placed This Encounter  Procedures  . DG Chest 2 View    Standing Status:   Future    Number of Occurrences:   1    Standing Expiration Date:   11/07/2018    Order Specific Question:   Reason for Exam (SYMPTOM  OR DIAGNOSIS REQUIRED)    Answer:   pedal edema    Order Specific Question:   Preferred imaging location?    Answer:   [facility]    Order Specific Question:   Radiology Contrast Protocol - do NOT remove file path    Answer:   [email or web address]   . CBC    Standing Status:   Future    Number of Occurrences:   1    Standing Expiration Date:   09/06/2018  . Comprehensive metabolic panel    Standing Status:   Future    Number of Occurrences:   1    Standing Expiration Date:   09/06/2018  . B Nat Peptide    Standing Status:   Future    Number of Occurrences:   1    Standing Expiration Date:   09/06/2018    Requested Prescriptions    No prescriptions requested or ordered in this encounter

## 2017-09-10 ENCOUNTER — Ambulatory Visit (INDEPENDENT_AMBULATORY_CARE_PROVIDER_SITE_OTHER): Payer: Medicare Other | Admitting: Family

## 2017-09-10 ENCOUNTER — Encounter: Payer: Self-pay | Admitting: Family

## 2017-09-10 VITALS — BP 112/62 | HR 74 | Temp 98.0°F | Ht 61.0 in | Wt 84.1 lb

## 2017-09-10 DIAGNOSIS — J449 Chronic obstructive pulmonary disease, unspecified: Secondary | ICD-10-CM

## 2017-09-10 DIAGNOSIS — R05 Cough: Secondary | ICD-10-CM | POA: Diagnosis not present

## 2017-09-10 DIAGNOSIS — R059 Cough, unspecified: Secondary | ICD-10-CM

## 2017-09-10 DIAGNOSIS — R600 Localized edema: Secondary | ICD-10-CM | POA: Diagnosis not present

## 2017-09-10 MED ORDER — HYDROCOD POLST-CPM POLST ER 10-8 MG/5ML PO SUER: 5.0000 mL | Freq: Every evening | ORAL | 0 refills | Status: DC | PRN

## 2017-09-10 NOTE — Progress Notes (Signed)
Jenny Torres is a 83 y.o. female with the following history as recorded in EpicCare:  Patient Active Problem List   Diagnosis Date Noted  . Physical deconditioning   . Pressure injury of skin 09/15/2016  . Nausea and vomiting 09/14/2016  . SIRS (systemic inflammatory response syndrome) ([place]) 09/14/2016  . Dysuria 09/08/2016  . Syncope and collapse 12/02/2014  . Hyperlipidemia 12/02/2014  . COPD (chronic obstructive pulmonary disease) ([place]) 12/02/2014  . Essential hypertension 12/02/2014  . Insomnia 02/18/2014  . Protein-calorie malnutrition, severe ([place]) 02/12/2014  . Low vitamin B12 level 12/29/2012  . Pulmonary nodule 10/21/2012  . Idiopathic peripheral neuropathy   . MVP (mitral valve prolapse)   . Osteoporosis   . Raynaud's disease 04/24/2011  . Anxiety state 11/10/2007  . Depression 11/10/2007    Current Outpatient Medications  Medication Sig Dispense Refill  . amLODipine (NORVASC) 2.5 MG tablet TAKE 1 TABLET DAILY 90 tablet 3  . aspirin EC 81 MG tablet Take 81 mg by mouth daily.    . feeding supplement, ENSURE ENLIVE, (ENSURE ENLIVE) LIQD Take 237 mLs by mouth 3 (three) times daily between meals. 14220 mL 5  . furosemide (LASIX) 20 MG tablet Take 0.5 tablets (10 mg total) by mouth daily. Prn for swelling 20 tablet 0  . gabapentin (NEURONTIN) 800 MG tablet TAKE 1 TABLET TWICE A DAY 180 tablet 1  . metoprolol tartrate (LOPRESSOR) 25 MG tablet Take 0.5 tablets (12.5 mg total) 2 (two) times daily by mouth. Need annual visit for further refills 90 tablet 0  . ondansetron (ZOFRAN ODT) 4 MG disintegrating tablet Take 1 tablet (4 mg total) by mouth every 8 (eight) hours as needed for nausea or vomiting. 15 tablet 5  . sertraline (ZOLOFT) 25 MG tablet Take 1 tablet (25 mg total) by mouth daily. 90 tablet 3  . zaleplon (SONATA) 5 MG capsule Take 1 capsule (5 mg total) by mouth at bedtime as needed for sleep. 14 capsule 0  . chlorpheniramine-HYDROcodone (TUSSIONEX PENNKINETIC ER)  10-8 MG/5ML SUER Take 5 mLs by mouth at bedtime as needed for cough. 115 mL 0   No current facility-administered medications for this visit.     Allergies: Lovastatin; Sulfamethoxazole; Darvocet [propoxyphene n-acetaminophen]; Erythromycin; Hydrocod polst-cpm polst er; Penicillins; Theophyllines; Zovirax [acyclovir]; Ceftin; and Paroxetine hcl  Past Medical History:  Diagnosis Date  . Anorexia   . Anxiety   . Carotid artery occlusion   . COPD (chronic obstructive pulmonary disease) ([place])   . Depression   . Diverticula, colon   . Gastritis   . GERD (gastroesophageal reflux disease)   . Hemorrhoids, external   . HTN (hypertension)   . Hyperlipidemia   . Hyperlipidemia   . Idiopathic peripheral neuropathy    chronic  . MVP (mitral valve prolapse)    stable  . Osteoporosis   . Palpitations   . Polyp of colon   . Raynaud's disease     Past Surgical History:  Procedure Laterality Date  . CESAREAN SECTION     x2  . CHOLECYSTECTOMY    . INTRAMEDULLARY (IM) NAIL INTERTROCHANTERIC Right 02/11/2014    Family History  Problem Relation Age of Onset  . Breast cancer Mother   . Liver disease Brother   . Cirrhosis Brother     Social History   Tobacco Use  . Smoking status: Former Smoker    Packs/day: 1.00    Years: 25.00    Pack years: 25.00    Types: Cigarettes    Last  attempt to quit: 09/22/1983    Years since quitting: 33.9  . Smokeless tobacco: Never Used  Substance Use Topics  . Alcohol use: Yes    Comment: rarely    Subjective:  2 day follow-up on pedal edema; was started on Lasix 10 mg qam; notes she has only taken 2 doses of the medication but can already tell " a huge improvement" in her symptoms; right leg still more swollen than left but redness almost completely resolved; feels much "less tight." Wonders about getting a prescription for Tussionex; notes that she used to keep a prescription on hand from her previous doctor; has had pneumonia in the past and is prone  to cough/congestion; lives in assisted living facility- knows that her neighbor currently has pneumonia.   Objective:  Vitals:   09/10/17 1043  BP: 112/62  Pulse: 74  Temp: 98 F (36.7 C)  TempSrc: Oral  SpO2: 95%  Weight: 84 lb 1.9 oz (38.2 kg)  Height: 5\' 1"  (1.549 m)    General: Well developed, well nourished, in no acute distress  Skin : Warm and dry.  Head: Normocephalic and atraumatic  Eyes: Sclera and conjunctiva clear; pupils round and reactive to light; extraocular movements intact  Lungs: Respirations unlabored; clear to auscultation bilaterally without wheeze, rales, rhonchi  CVS exam: normal rate and regular rhythm.  Musculoskeletal: No deformities; no active joint inflammation  Extremities: mild bilateral pedal edema- not pitting, no cyanosis, mild erythema noted over left calf/ top of foot Vessels: Symmetric bilaterally  Neurologic: Alert and oriented; speech intact; face symmetrical; moves all extremities well; CNII-XII intact without focal deficit  Assessment:  1. Pedal edema   2. Cough   3. Chronic obstructive pulmonary disease, unspecified COPD type ([place])     Plan:  1. Marked improvement in symptoms; patient denies any pain in her calf and notes that she feels "much better." Will continue 10 mg Lasix until swelling completely resolves; eat banana daily while on the medication; follow-up if swelling not gone within 1 week; 2. Agree to refill on Tussionex- she is given Rx; 3. Discussed CXR with patient; known history of scarring; refer to pulmonology to evaluate COPD changes;   Follow-up with her PCP, Dr. Sharlet Salina, as needed otherwise.   No Follow-up on file.  Orders Placed This Encounter  Procedures  . Ambulatory referral to Pulmonology    Referral Priority:   Routine    Referral Type:   Consultation    Referral Reason:   Specialty Services Required    Requested Specialty:   Pulmonary Disease    Number of Visits Requested:   1    Requested Prescriptions    Signed Prescriptions Disp Refills  . chlorpheniramine-HYDROcodone (TUSSIONEX PENNKINETIC ER) 10-8 MG/5ML SUER 115 mL 0    Sig: Take 5 mLs by mouth at bedtime as needed for cough.

## 2017-09-15 ENCOUNTER — Other Ambulatory Visit: Payer: Self-pay | Admitting: Internal Medicine

## 2017-09-15 DIAGNOSIS — G603 Idiopathic progressive neuropathy: Secondary | ICD-10-CM

## 2017-09-26 ENCOUNTER — Other Ambulatory Visit: Payer: Self-pay | Admitting: Internal Medicine

## 2017-10-14 ENCOUNTER — Ambulatory Visit (INDEPENDENT_AMBULATORY_CARE_PROVIDER_SITE_OTHER): Payer: Medicare Other | Admitting: Emergency Medicine

## 2017-10-14 ENCOUNTER — Encounter: Payer: Self-pay | Admitting: Emergency Medicine

## 2017-10-14 VITALS — BP 130/70 | HR 74 | Ht 61.0 in | Wt 86.8 lb

## 2017-10-14 DIAGNOSIS — J449 Chronic obstructive pulmonary disease, unspecified: Secondary | ICD-10-CM | POA: Diagnosis not present

## 2017-10-14 NOTE — Patient Instructions (Signed)
We will arrange for pulmonary function testing  Follow with Dr Lamonte Sakai next available opening with full PFT same day.

## 2017-10-14 NOTE — Assessment & Plan Note (Signed)
Possible COPD based on her tobacco history and her chest x-ray findings.  She has stable exertional dyspnea.  Has never had a clear exacerbation although she does tell me that "colds like to go to my chest".  Best way to define is to perform pulmonary function testing.  We will arrange for this and then I will follow-up with her.  Based on this and her clinical symptoms we will decide whether inhaled bronchodilators are indicated.

## 2017-10-14 NOTE — Progress Notes (Signed)
Subjective:    Patient ID: Jenny Torres, female    DOB: 04-29-1928, 84 y.o.   MRN: [id number]  HPI 84 year old former smoker (25 pack years) with a history of hypertension, mitral valve prolapse, a positive PPD.  I seen her before in the past in the setting of a right middle lobe nodule.  Her QuantiFERON gold testing was negative and the nodule was stable so we never treated her with INH.   She is referred back for evaluation today for possible COPD. She had a CXR in 08/2017 that showed possible COPD, some biappical scarring, evidence change in her nodule. She has some exertional SOB, can happen with hills, stairs. She believes that this has been present for several years. No cough. No wheeze.    Review of Systems  Constitutional: Positive for appetite change. Negative for fever and unexpected weight change.  HENT: Positive for ear pain. Negative for congestion, dental problem, nosebleeds, postnasal drip, rhinorrhea, sinus pressure, sneezing, sore throat and trouble swallowing.   Eyes: Negative for redness and itching.  Respiratory: Positive for shortness of breath. Negative for cough, chest tightness and wheezing.   Cardiovascular: Positive for leg swelling. Negative for palpitations.  Gastrointestinal: Negative for nausea and vomiting.  Genitourinary: Negative for dysuria.  Musculoskeletal: Negative for joint swelling.  Skin: Negative for rash.  Allergic/Immunologic: Negative.  Negative for environmental allergies, food allergies and immunocompromised state.  Neurological: Negative for headaches.  Hematological: Does not bruise/bleed easily.  Psychiatric/Behavioral: Negative for dysphoric mood. The patient is not nervous/anxious.    Past Medical History:  Diagnosis Date  . Anorexia   . Anxiety   . Carotid artery occlusion   . COPD (chronic obstructive pulmonary disease) ([place])   . Depression   . Diverticula, colon   . Gastritis   . GERD (gastroesophageal reflux disease)   .  Hemorrhoids, external   . HTN (hypertension)   . Hyperlipidemia   . Hyperlipidemia   . Idiopathic peripheral neuropathy    chronic  . MVP (mitral valve prolapse)    stable  . Osteoporosis   . Palpitations   . Polyp of colon   . Raynaud's disease      Family History  Problem Relation Age of Onset  . Breast cancer Mother   . Liver disease Brother   . Cirrhosis Brother      Social History   Socioeconomic History  . Marital status: Married    Spouse name: Not on file  . Number of children: 2  . Years of education: Not on file  . Highest education level: Not on file  Social Needs  . Financial resource strain: Not on file  . Food insecurity - worry: Not on file  . Food insecurity - inability: Not on file  . Transportation needs - medical: Not on file  . Transportation needs - non-medical: Not on file  Occupational History  . Occupation: caregiver    Employer: RETIRED  Tobacco Use  . Smoking status: Former Smoker    Packs/day: 1.00    Years: 25.00    Pack years: 25.00    Types: Cigarettes    Last attempt to quit: 09/22/1983    Years since quitting: 34.0  . Smokeless tobacco: Never Used  Substance and Sexual Activity  . Alcohol use: Yes    Comment: rarely  . Drug use: No  . Sexual activity: No  Other Topics Concern  . Not on file  Social History Narrative   Retired.  Allergies  Allergen Reactions  . Lovastatin Other (See Comments)    unknown  . Sulfamethoxazole Swelling    Swelling of the throat, mouth, tongue, nose  . Darvocet [Propoxyphene N-Acetaminophen] Itching  . Erythromycin Nausea And Vomiting    Upset stomach  . Hydrocod Polst-Cpm Polst Er Nausea Only    nausea  . Penicillins Nausea And Vomiting    Has patient had a PCN reaction causing immediate rash, facial/tongue/throat swelling, SOB or lightheadedness with hypotension: No Has patient had a PCN reaction causing severe rash involving mucus membranes or skin necrosis: No Has patient had a  PCN reaction that required hospitalization No Has patient had a PCN reaction occurring within the last 10 years: yes If all of the above answers are "NO", then may proceed with Cephalosporin use.   . Theophyllines Other (See Comments)    Unknown reaction  . Zovirax [Acyclovir] Other (See Comments)    Per patient lots of side effects  . Ceftin Diarrhea    GI problems  . Paroxetine Hcl Other (See Comments)    jittery     Outpatient Medications Prior to Visit  Medication Sig Dispense Refill  . amLODipine (NORVASC) 2.5 MG tablet TAKE 1 TABLET DAILY 90 tablet 3  . aspirin EC 81 MG tablet Take 81 mg by mouth daily.    . chlorpheniramine-HYDROcodone (TUSSIONEX PENNKINETIC ER) 10-8 MG/5ML SUER Take 5 mLs by mouth at bedtime as needed for cough. 115 mL 0  . feeding supplement, ENSURE ENLIVE, (ENSURE ENLIVE) LIQD Take 237 mLs by mouth 3 (three) times daily between meals. 14220 mL 5  . gabapentin (NEURONTIN) 800 MG tablet TAKE 1 TABLET TWICE A DAY 180 tablet 1  . metoprolol tartrate (LOPRESSOR) 25 MG tablet Take 0.5 tablets (12.5 mg total) 2 (two) times daily by mouth. Need annual visit for further refills 90 tablet 0  . ondansetron (ZOFRAN ODT) 4 MG disintegrating tablet Take 1 tablet (4 mg total) by mouth every 8 (eight) hours as needed for nausea or vomiting. 15 tablet 5  . furosemide (LASIX) 20 MG tablet Take 0.5 tablets (10 mg total) by mouth daily. Prn for swelling (Patient not taking: Reported on 10/14/2017) 20 tablet 0  . sertraline (ZOLOFT) 25 MG tablet TAKE 1 TABLET DAILY (Patient not taking: Reported on 10/14/2017) 90 tablet 0  . zaleplon (SONATA) 5 MG capsule Take 1 capsule (5 mg total) by mouth at bedtime as needed for sleep. (Patient not taking: Reported on 10/14/2017) 14 capsule 0   No facility-administered medications prior to visit.         Objective:   Physical Exam Vitals:   10/14/17 1504  BP: 130/70  Pulse: 74  SpO2: 95%  Weight: 86 lb 12.8 oz (39.4 kg)  Height: 5\' 1"   (1.549 m)   Gen: Pleasant, thin woman, in no distress,  normal affect, using a walker  ENT: No lesions,  mouth clear,  oropharynx clear, no postnasal drip  Neck: No JVD, no stridor  Lungs: No use of accessory muscles, no crackles or wheezing  Cardiovascular: RRR, heart sounds normal, no murmur or gallops, no peripheral edema  Musculoskeletal: No deformities, no cyanosis or clubbing  Neuro: alert, non focal  Skin: Warm, no lesions or rash      Assessment & Plan:  COPD (chronic obstructive pulmonary disease) Possible COPD based on her tobacco history and her chest x-ray findings.  She has stable exertional dyspnea.  Has never had a clear exacerbation although she does tell me that "  colds like to go to my chest".  Best way to define is to perform pulmonary function testing.  We will arrange for this and then I will follow-up with her.  Based on this and her clinical symptoms we will decide whether inhaled bronchodilators are indicated.  Baltazar Apo, MD, PhD 10/14/2017, 3:53 PM [facility] [phone] or if no answer [phone]

## 2017-10-22 ENCOUNTER — Observation Stay (HOSPITAL_COMMUNITY)
Admission: EM | Admit: 2017-10-22 | Discharge: 2017-10-25 | Disposition: A | Discharge status: Home / Self Care | Payer: Medicare Other | Attending: Family Medicine | Admitting: Family Medicine

## 2017-10-22 ENCOUNTER — Encounter (HOSPITAL_COMMUNITY): Payer: Self-pay

## 2017-10-22 ENCOUNTER — Other Ambulatory Visit: Payer: Self-pay

## 2017-10-22 ENCOUNTER — Emergency Department (HOSPITAL_COMMUNITY): Payer: Medicare Other

## 2017-10-22 DIAGNOSIS — N179 Acute kidney failure, unspecified: Secondary | ICD-10-CM | POA: Diagnosis not present

## 2017-10-22 DIAGNOSIS — Z79899 Other long term (current) drug therapy: Secondary | ICD-10-CM | POA: Diagnosis not present

## 2017-10-22 DIAGNOSIS — I10 Essential (primary) hypertension: Secondary | ICD-10-CM | POA: Diagnosis not present

## 2017-10-22 DIAGNOSIS — R112 Nausea with vomiting, unspecified: Secondary | ICD-10-CM

## 2017-10-22 DIAGNOSIS — Z87891 Personal history of nicotine dependence: Secondary | ICD-10-CM | POA: Insufficient documentation

## 2017-10-22 DIAGNOSIS — G43A1 Cyclical vomiting, intractable: Secondary | ICD-10-CM | POA: Diagnosis not present

## 2017-10-22 DIAGNOSIS — E860 Dehydration: Principal | ICD-10-CM | POA: Insufficient documentation

## 2017-10-22 DIAGNOSIS — J449 Chronic obstructive pulmonary disease, unspecified: Secondary | ICD-10-CM | POA: Insufficient documentation

## 2017-10-22 DIAGNOSIS — R5381 Other malaise: Secondary | ICD-10-CM | POA: Insufficient documentation

## 2017-10-22 LAB — URINALYSIS, ROUTINE W REFLEX MICROSCOPIC
BACTERIA UA: NONE SEEN
Bilirubin Urine: NEGATIVE
Glucose, UA: NEGATIVE mg/dL
HGB URINE DIPSTICK: NEGATIVE
Ketones, ur: 20 mg/dL — AB
Nitrite: NEGATIVE
Protein, ur: 100 mg/dL — AB
Specific Gravity, Urine: 1.025 (ref 1.005–1.030)
pH: 5 (ref 5.0–8.0)

## 2017-10-22 LAB — COMPREHENSIVE METABOLIC PANEL
ALT: 17 U/L (ref 14–54)
AST: 35 U/L (ref 15–41)
Albumin: 4.5 g/dL (ref 3.5–5.0)
Alkaline Phosphatase: 106 U/L (ref 38–126)
Anion gap: 19 — ABNORMAL HIGH (ref 5–15)
BUN: 63 mg/dL — AB (ref 6–20)
CHLORIDE: 101 mmol/L (ref 101–111)
CO2: 22 mmol/L (ref 22–32)
Calcium: 9.7 mg/dL (ref 8.9–10.3)
Creatinine, Ser: 1.04 mg/dL — ABNORMAL HIGH (ref 0.44–1.00)
GFR calc Af Amer: 54 mL/min — ABNORMAL LOW (ref >60)
GFR calc non Af Amer: 47 mL/min — ABNORMAL LOW (ref >60)
GLUCOSE: 109 mg/dL — AB (ref 65–99)
POTASSIUM: 3.9 mmol/L (ref 3.5–5.1)
Sodium: 142 mmol/L (ref 135–145)
Total Bilirubin: 2.9 mg/dL — ABNORMAL HIGH (ref 0.3–1.2)
Total Protein: 8.2 g/dL — ABNORMAL HIGH (ref 6.5–8.1)

## 2017-10-22 LAB — CBC
HEMATOCRIT: 42.3 % (ref 36.0–46.0)
Hemoglobin: 14.9 g/dL (ref 12.0–15.0)
MCH: 30.5 pg (ref 26.0–34.0)
MCHC: 35.2 g/dL (ref 30.0–36.0)
MCV: 86.7 fL (ref 78.0–100.0)
PLATELETS: 394 10*3/uL (ref 150–400)
RBC: 4.88 MIL/uL (ref 3.87–5.11)
RDW: 14.3 % (ref 11.5–15.5)
WBC: 20.8 10*3/uL — ABNORMAL HIGH (ref 4.0–10.5)

## 2017-10-22 LAB — LIPASE, BLOOD: LIPASE: 39 U/L (ref 11–51)

## 2017-10-22 MED ORDER — SODIUM CHLORIDE 0.9 % IV BOLUS (SEPSIS)
1000.0000 mL | Freq: Once | INTRAVENOUS | Status: AC
  Administered 2017-10-22: 1000 mL via INTRAVENOUS

## 2017-10-22 MED ORDER — ONDANSETRON HCL 4 MG/2ML IJ SOLN
4.0000 mg | Freq: Once | INTRAMUSCULAR | Status: AC | PRN
  Administered 2017-10-22: 4 mg via INTRAVENOUS
  Filled 2017-10-22: qty 2

## 2017-10-22 NOTE — ED Notes (Signed)
Bed: WHALA Expected date:  Expected time:  Means of arrival:  Comments: No bed. 

## 2017-10-22 NOTE — ED Triage Notes (Signed)
Pt is alert and oriented x 4 and is verbally responsive. Pt denies diarrhea and reports that she has been vomiting. Pt dtr is at bedside and explains that pt has not been vomiting but has been spitting up non stop. Pt does report having some mild nausea. Pt HR is tachycardic. Pt dtr states that pt reports that she is refusing to eat and drink until tomorrow. Pt dtr did not feel comfortable leaving pt without hydration.

## 2017-10-22 NOTE — ED Triage Notes (Signed)
Patient brought in by EMS from [facility] with complaints of nausea/vomiting x4 days. Denies diarrhea. Denies fever. Patient reports recurring nausea/vomiting causing increased lethargy. Patient's daughter en route to [facility]. Patient AxOx4 for EMS, but generally uses one to two word sentences.  EMS vitals: CBG= 124, 136/80, HR 115, RR 18, Sat 94% RA, 20G L AC PIV placed by EMS

## 2017-10-22 NOTE — ED Notes (Signed)
Bed: WA07 Expected date:  Expected time:  Means of arrival:  Comments: 84 yo N/V/D

## 2017-10-22 NOTE — H&P (Addendum)
PCP:   Jenny Koch, MD   Chief Complaint:  Nausea and vomiting  HPI: This is a 84 year old female resident of [facility] home, she has a history of nausea and vomiting and has desolvable Zofran ordered as needed, for when this occurs.  Per patient's and ER attending her nausea has been ongoing all week.  For an unclear reason she has not been given Zofran and tonight she was quite weak and she was sent to the ER.  There is no reported of any fevers, chills, diarrhea.  In the ER the patient has a chronic, mild but hacking cough.  It is nonproductive.  She appears to be hypocellular bathing and comfortable.  Patient states that this cough occurs whenever she is ill like this and nervous.  Cough also seems to be associated with intake.  Her daughter was initially present at bedside but was not here during my interview.  In the ER she did receive Zofran, but appears to have persistent nausea.  The hospitalists have been asked to admit.  Review of Systems:  The patient denies anorexia, fever, weight loss,, vision loss, decreased hearing, hoarseness, chest pain, syncope, dyspnea on exertion, peripheral edema, balance deficits, hemoptysis, nausea, cough abdominal pain, melena, hematochezia, severe indigestion/heartburn, hematuria, incontinence, genital sores, muscle weakness, suspicious skin lesions, transient blindness, difficulty walking, depression, unusual weight change, abnormal bleeding, enlarged lymph nodes, angioedema, and breast masses.  Past Medical History: Past Medical History:  Diagnosis Date  . Anorexia   . Anxiety   . Carotid artery occlusion   . COPD (chronic obstructive pulmonary disease) (Jenny Torres)   . Depression   . Diverticula, colon   . Gastritis   . GERD (gastroesophageal reflux disease)   . Hemorrhoids, external   . HTN (hypertension)   . Hyperlipidemia   . Hyperlipidemia   . Idiopathic peripheral neuropathy    chronic  . MVP (mitral valve prolapse)    stable  . Osteoporosis   . Palpitations   . Polyp of colon   . Raynaud's disease    Past Surgical History:  Procedure Laterality Date  . CESAREAN SECTION     x2  . CHOLECYSTECTOMY    . INTRAMEDULLARY (IM) NAIL INTERTROCHANTERIC Right 02/11/2014    Medications: Prior to Admission medications   Medication Sig Start Date End Date Taking? Authorizing Provider  amLODipine (NORVASC) 2.5 MG tablet TAKE 1 TABLET DAILY 12/31/16  Yes Jenny Koch, MD  chlorpheniramine-HYDROcodone [facility] PENNKINETIC ER) 10-8 MG/5ML SUER Take 5 mLs by mouth at bedtime as needed for cough. 09/10/17  Yes Jenny Salvage, FNP  gabapentin (NEURONTIN) 800 MG tablet TAKE 1 TABLET TWICE A DAY 09/15/17  Yes Jenny Koch, MD  metoprolol tartrate (LOPRESSOR) 25 MG tablet Take 0.5 tablets (12.5 mg total) 2 (two) times daily by mouth. Need annual visit for further refills 07/26/17  Yes Jenny Koch, MD  feeding supplement, ENSURE ENLIVE, (ENSURE ENLIVE) LIQD Take 237 mLs by mouth 3 (three) times daily between meals. Patient not taking: Reported on 10/22/2017 09/17/16   Jenny Dubois, MD  furosemide (LASIX) 20 MG tablet Take 0.5 tablets (10 mg total) by mouth daily. Prn for swelling Patient not taking: Reported on 10/14/2017 09/06/17   Jenny Salvage, FNP  ondansetron (ZOFRAN ODT) 4 MG disintegrating tablet Take 1 tablet (4 mg total) by mouth every 8 (eight) hours as needed for nausea or vomiting. Patient not taking: Reported on 10/22/2017 04/15/17   Jenny Eagles, PA-C  sertraline (ZOLOFT) 25 MG  tablet TAKE 1 TABLET DAILY Patient not taking: Reported on 10/14/2017 09/27/17   Jenny Koch, MD  zaleplon (SONATA) 5 MG capsule Take 1 capsule (5 mg total) by mouth at bedtime as needed for sleep. Patient not taking: Reported on 10/14/2017 07/08/17   Jenny Koch, MD    Allergies:   Allergies  Allergen Reactions  . Lovastatin Other (See Comments)    unknown  .  Sulfamethoxazole Swelling    Swelling of the throat, mouth, tongue, nose  . Darvocet [Propoxyphene N-Acetaminophen] Itching  . Erythromycin Nausea And Vomiting    Upset stomach  . Hydrocod Polst-Cpm Polst Er Nausea Only    nausea  . Penicillins Nausea And Vomiting    "CAN TOLERATE NOW" Has patient had a PCN reaction causing immediate rash, facial/tongue/throat swelling, SOB or lightheadedness with hypotension: No Has patient had a PCN reaction causing severe rash involving mucus membranes or skin necrosis: No Has patient had a PCN reaction that required hospitalization No Has patient had a PCN reaction occurring within the last 10 years: yes If all of the above answers are "NO", then may proceed with Cephalosporin use.   . Theophyllines Other (See Comments)    Unknown reaction  . Zovirax [Acyclovir] Other (See Comments)    Per patient lots of side effects  . Ceftin Diarrhea    GI problems  . Paroxetine Hcl Other (See Comments)    jittery    Social History:  reports that she quit smoking about 34 years ago. Her smoking use included cigarettes. She has a 25.00 pack-year smoking history. she has never used smokeless tobacco. She reports that she drinks alcohol. She reports that she does not use drugs.  Family History: Family History  Problem Relation Age of Onset  . Breast cancer Mother   . Liver disease Brother   . Cirrhosis Brother     Physical Exam: Vitals:   10/22/17 1919 10/22/17 1922 10/22/17 2222 10/22/17 2306  BP: 140/84 (!) 148/80  (!) 148/91  Pulse: (!) 115 (!) 133  (!) 119  Resp: (!) 22 18  20   Temp: 97.8 F (36.6 C)  99.5 F (37.5 C) 99.5 F (37.5 C)  TempSrc: Oral  Rectal Rectal  SpO2: 95% 97%  95%  Weight:      Height:        General:  Alert and oriented times three, well developed and nourished, no acute distress, weak, fragile Eyes: PERRLA, pink conjunctiva, no scleral icterus ENT: Moist oral mucosa, neck supple, no thyromegaly Lungs: clear to  ascultation, no wheeze, no crackles, no use of accessory muscles Cardiovascular: regular rate and rhythm, no regurgitation, no gallops, no murmurs. No carotid bruits, no JVD Abdomen: soft, positive BS, non-tender, non-distended, no organomegaly, not an acute abdomen GU: not examined Neuro: CN II - XII grossly intact, sensation intact Musculoskeletal: strength 5/5 all extremities, no clubbing, cyanosis or edema Skin: no rash, no subcutaneous crepitation, no decubitus Psych: appropriate patient   Labs on Admission:  Recent Labs    10/22/17 1928  NA 142  K 3.9  CL 101  CO2 22  GLUCOSE 109*  BUN 63*  CREATININE 1.04*  CALCIUM 9.7   Recent Labs    10/22/17 1928  AST 35  ALT 17  ALKPHOS 106  BILITOT 2.9*  PROT 8.2*  ALBUMIN 4.5   Recent Labs    10/22/17 1928  LIPASE 39   Recent Labs    10/22/17 1928  WBC 20.8*  HGB 14.9  HCT 42.3  MCV 86.7  PLT 394   No results for input(s): CKTOTAL, CKMB, CKMBINDEX, TROPONINI in the last 72 hours. Invalid input(s): POCBNP No results for input(s): DDIMER in the last 72 hours. No results for input(s): HGBA1C in the last 72 hours. No results for input(s): CHOL, HDL, LDLCALC, TRIG, CHOLHDL, LDLDIRECT in the last 72 hours. No results for input(s): TSH, T4TOTAL, T3FREE, THYROIDAB in the last 72 hours.  Invalid input(s): FREET3 No results for input(s): VITAMINB12, FOLATE, FERRITIN, TIBC, IRON, RETICCTPCT in the last 72 hours.  Micro Results: No results found for this or any previous visit (from the past 240 hour(s)).   Radiological Exams on Admission: Dg Chest 2 View  Result Date: 10/22/2017 CLINICAL DATA:  Cough. EXAM: CHEST  2 VIEW COMPARISON:  Radiographs of September 06, 2017. FINDINGS: The heart size and mediastinal contours are within normal limits. Biapical scarring is noted. No acute pulmonary disease is noted. No pneumothorax or pleural effusion is noted. Hyperexpansion of the lungs is noted. The visualized skeletal  structures are unremarkable. IMPRESSION: No active cardiopulmonary disease. Findings consistent with chronic obstructive pulmonary disease. Electronically Signed   By: Marijo Conception, M.D.   On: 10/22/2017 20:44    Assessment/Plan Present on Admission: Cyclic ausea vomiting -Admit for observation 23 hours -Antiemetics Zofran scheduled for next 24 hours, Tessalon Perles ordered PRN -Gentle IV fluid hydration -Monitor patient overnight -[place]weallow evaluation ordered  . COPD (chronic obstructive pulmonary disease) (HCC) -Stable, home meds resumed  . Essential hypertension  -stable, home meds resumed    Evander Macaraeg 10/22/2017, 11:30 PM

## 2017-10-22 NOTE — ED Provider Notes (Signed)
[facility] DEPT Provider Note   CSN: [id number] Arrival date & time: 10/22/17  1807     History   Chief Complaint Chief Complaint  Patient presents with  . Nausea  . Emesis  . generalized weakness    HPI Jenny Torres is a 84 y.o. female past medical history of anxiety, GERD, hypertension BIB EMS from [place] presents for evaluation of generalized weakness, nausea and vomiting that has been going on for the last 3-4 days.  Daughter who is at bedside reports that the nursing home called today saying that she was having difficulty tolerating p.o for the last 2 days.  Daughter reports that patient has a history of similar symptoms and had been on antiemetics for several years.  Daughter reports that patient will start feeling nauseous and will start getting a nervous cough and will start spitting up and will not be able to tolerate any p.o.  Patient does have anti-medics at home which she takes normally but states that when she starts coughing, she is afraid that she will not be able to keep down it does not take it. Additionally, duaghter reports that nursing home staff will stop giving patient anti-emetics when she starts coughing. Daughter also reports that when the symptoms began, patient will lose her appetite will stop eating and get dehydrated.  Daughter does not know how long patient has not been able to tolerate p.o., though nursing home said it has been several days.  On ED arrival, patient does not have any pain patient denies any fever, chest pain, difficulty breathing, abdominal pain, dysuria, hematuria.  The history is provided by the patient.    Past Medical History:  Diagnosis Date  . Anorexia   . Anxiety   . Carotid artery occlusion   . COPD (chronic obstructive pulmonary disease) ([place])   . Depression   . Diverticula, colon   . Gastritis   . GERD (gastroesophageal reflux disease)   . Hemorrhoids, external   . HTN  (hypertension)   . Hyperlipidemia   . Hyperlipidemia   . Idiopathic peripheral neuropathy    chronic  . MVP (mitral valve prolapse)    stable  . Osteoporosis   . Palpitations   . Polyp of colon   . Raynaud's disease     Patient Active Problem List   Diagnosis Date Noted  . Dehydration   . Physical deconditioning   . Pressure injury of skin 09/15/2016  . Nausea & vomiting 09/14/2016  . SIRS (systemic inflammatory response syndrome) ([place]) 09/14/2016  . Dysuria 09/08/2016  . Syncope and collapse 12/02/2014  . Hyperlipidemia 12/02/2014  . COPD (chronic obstructive pulmonary disease) ([place]) 12/02/2014  . Essential hypertension 12/02/2014  . Insomnia 02/18/2014  . Protein-calorie malnutrition, severe ([place]) 02/12/2014  . Low vitamin B12 level 12/29/2012  . Pulmonary nodule 10/21/2012  . Idiopathic peripheral neuropathy   . MVP (mitral valve prolapse)   . Osteoporosis   . Raynaud's disease 04/24/2011  . Anxiety state 11/10/2007  . Depression 11/10/2007    Past Surgical History:  Procedure Laterality Date  . CESAREAN SECTION     x2  . CHOLECYSTECTOMY    . INTRAMEDULLARY (IM) NAIL INTERTROCHANTERIC Right 02/11/2014    OB History    No data available       Home Medications    Prior to Admission medications   Medication Sig Start Date End Date Taking? Authorizing Provider  amLODipine (NORVASC) 2.5 MG tablet TAKE 1 TABLET DAILY 12/31/16  Yes Hoyt Koch, MD  chlorpheniramine-HYDROcodone [facility] PENNKINETIC ER) 10-8 MG/5ML SUER Take 5 mLs by mouth at bedtime as needed for cough. 09/10/17  Yes Marrian Salvage, FNP  gabapentin (NEURONTIN) 800 MG tablet TAKE 1 TABLET TWICE A DAY 09/15/17  Yes Hoyt Koch, MD  metoprolol tartrate (LOPRESSOR) 25 MG tablet Take 0.5 tablets (12.5 mg total) 2 (two) times daily by mouth. Need annual visit for further refills 07/26/17  Yes Hoyt Koch, MD  feeding supplement, ENSURE ENLIVE, (ENSURE ENLIVE) LIQD  Take 237 mLs by mouth 3 (three) times daily between meals. Patient not taking: Reported on 10/22/2017 09/17/16   Barton Dubois, MD  furosemide (LASIX) 20 MG tablet Take 0.5 tablets (10 mg total) by mouth daily. Prn for swelling Patient not taking: Reported on 10/14/2017 09/06/17   Marrian Salvage, FNP  ondansetron (ZOFRAN ODT) 4 MG disintegrating tablet Take 1 tablet (4 mg total) by mouth every 8 (eight) hours as needed for nausea or vomiting. Patient not taking: Reported on 10/22/2017 04/15/17   Jaynee Eagles, PA-C  sertraline (ZOLOFT) 25 MG tablet TAKE 1 TABLET DAILY Patient not taking: Reported on 10/14/2017 09/27/17   Hoyt Koch, MD  zaleplon (SONATA) 5 MG capsule Take 1 capsule (5 mg total) by mouth at bedtime as needed for sleep. Patient not taking: Reported on 10/14/2017 07/08/17   Hoyt Koch, MD    Family History Family History  Problem Relation Age of Onset  . Breast cancer Mother   . Liver disease Brother   . Cirrhosis Brother     Social History Social History   Tobacco Use  . Smoking status: Former Smoker    Packs/day: 1.00    Years: 25.00    Pack years: 25.00    Types: Cigarettes    Last attempt to quit: 09/22/1983    Years since quitting: 34.1  . Smokeless tobacco: Never Used  Substance Use Topics  . Alcohol use: Yes    Comment: rarely  . Drug use: No     Allergies   Lovastatin; Sulfamethoxazole; Darvocet [propoxyphene n-acetaminophen]; Erythromycin; Hydrocod polst-cpm polst er; Penicillins; Theophyllines; Zovirax [acyclovir]; Ceftin; and Paroxetine hcl   Review of Systems Review of Systems  Constitutional: Positive for appetite change. Negative for chills and fever.  HENT: Negative for congestion.   Eyes: Negative for visual disturbance.  Respiratory: Negative for cough and shortness of breath.   Cardiovascular: Negative for chest pain.  Gastrointestinal: Positive for nausea and vomiting. Negative for abdominal pain and diarrhea.    Genitourinary: Negative for dysuria and hematuria.  Skin: Negative for rash.  Neurological: Negative for dizziness, weakness, numbness and headaches.  All other systems reviewed and are negative.    Physical Exam Updated Vital Signs BP (!) 148/91   Pulse (!) 119   Temp 99.5 F (37.5 C) (Rectal)   Resp 20   Ht 5\' 1"  (1.549 m)   Wt 39 kg (86 lb)   SpO2 95%   BMI 16.25 kg/m   Physical Exam  Constitutional: She is oriented to person, place, and time. She appears well-developed and well-nourished.  Frail and elderly-appearing  HENT:  Head: Normocephalic and atraumatic.  Mouth/Throat: Oropharynx is clear and moist. Mucous membranes are dry.  Airway patent, phonation is normal.  Patient will intermittently spit up secretions and then other times will be able to tolerate secretions without any difficulty.  Eyes: Conjunctivae, EOM and lids are normal. Pupils are equal, round, and reactive to light.  Neck: Full  passive range of motion without pain.  Cardiovascular: Regular rhythm, normal heart sounds and normal pulses. Tachycardia present. Exam reveals no gallop and no friction rub.  No murmur heard. Pulmonary/Chest: Effort normal and breath sounds normal.  No evidence of respiratory distress. Able to speak in full sentences without difficulty.  Will intermittently cough.  Abdominal: Soft. Normal appearance. There is no tenderness. There is no rigidity and no guarding.  Abdomen is soft, non-distended. No tenderness noted. No rigidity, guarding. No peritoneal signs.   Musculoskeletal: Normal range of motion.  Neurological: She is alert and oriented to person, place, and time.  Skin: Skin is warm and dry. Capillary refill takes less than 2 seconds.  Psychiatric: She has a normal mood and affect. Her speech is normal.  Nursing note and vitals reviewed.    ED Treatments / Results  Labs (all labs ordered are listed, but only abnormal results are displayed) Labs Reviewed   COMPREHENSIVE METABOLIC PANEL - Abnormal; Notable for the following components:      Result Value   Glucose, Bld 109 (*)    BUN 63 (*)    Creatinine, Ser 1.04 (*)    Total Protein 8.2 (*)    Total Bilirubin 2.9 (*)    GFR calc non Af Amer 47 (*)    GFR calc Af Amer 54 (*)    Anion gap 19 (*)    All other components within normal limits  CBC - Abnormal; Notable for the following components:   WBC 20.8 (*)    All other components within normal limits  URINALYSIS, ROUTINE W REFLEX MICROSCOPIC - Abnormal; Notable for the following components:   Color, Urine AMBER (*)    Ketones, ur 20 (*)    Protein, ur 100 (*)    Leukocytes, UA SMALL (*)    Squamous Epithelial / LPF 0-5 (*)    All other components within normal limits  LIPASE, BLOOD    EKG  EKG Interpretation  Date/Time:  Friday October 22 2017 19:27:44 EST Ventricular Rate:  118 PR Interval:    QRS Duration: 110 QT Interval:  336 QTC Calculation: 471 R Axis:   43 Text Interpretation:  Sinus tachycardia Ventricular premature complex Probable left atrial enlargement RSR' in V1 or V2, probably normal variant Borderline repolarization abnormality Baseline wander in lead(s) V4 Since last EKG, rate has incresaed No new ST elevations or depressions Confirmed by Duffy Bruce [phone]) on 10/22/2017 7:30:57 PM       Radiology Dg Chest 2 View  Result Date: 10/22/2017 CLINICAL DATA:  Cough. EXAM: CHEST  2 VIEW COMPARISON:  Radiographs of September 06, 2017. FINDINGS: The heart size and mediastinal contours are within normal limits. Biapical scarring is noted. No acute pulmonary disease is noted. No pneumothorax or pleural effusion is noted. Hyperexpansion of the lungs is noted. The visualized skeletal structures are unremarkable. IMPRESSION: No active cardiopulmonary disease. Findings consistent with chronic obstructive pulmonary disease. Electronically Signed   By: Marijo Conception, M.D.   On: 10/22/2017 20:44     Procedures Procedures (including critical care time)  Medications Ordered in ED Medications  ondansetron (ZOFRAN) injection 4 mg (4 mg Intravenous Given 10/22/17 1929)  sodium chloride 0.9 % bolus 1,000 mL (0 mLs Intravenous Stopped 10/22/17 2210)  sodium chloride 0.9 % bolus 1,000 mL (1,000 mLs Intravenous New Bag/Given 10/22/17 2207)     Initial Impression / Assessment and Plan / ED Course  I have reviewed the triage vital signs and the nursing notes.  Pertinent labs & imaging results that were available during my care of the patient were reviewed by me and considered in my medical decision making (see chart for details).     84 year old female who presents for evaluation of 3 days of nausea/vomiting and decreased p.o.  Nursing home called family today to tell them that patient had not been able to tolerate p.o. for the last 24 hours.  Daughter reports the patient has a history of similar symptoms.  She states that she will get nauseous and had vomiting and then will stop eating.  Additionally, daughter reports the patient has this cough will continuously happen and patient will spit up her secretions.  When patient starts having the cough, she will not take her antiemetics because she is afraid she will not keep them down.  On ED arrival, patient does not complain of any pain.  Initial vitals show patient is afebrile.  She is slightly tachycardic but blood pressure stable.  Suspect that this may be due to dehydration.  Abdomen exam is benign.  No tenderness to palpation.  Consider dehydration versus orthostatic hypotension versus infectious etiology.  Do not suspect food impaction at this time as patient will be able to tolerate her secretions and the mother times her intermittently coughing and spitting up.  Plan for basic labs, IVF.  Given cough, will plan for chest x-ray.  Labs and imaging reviewed.  CMP shows bump in BUN and creatinine.  BUN is 63 compared to previous Bun of 9 one month  ago. CBC shows leukocytosis of 20.8.  I reviewed patient's records.  She has a history of elevated white blood cell count with similar symptoms 2 months ago.  At this time, I do not suspect abdominal source of infection.  Lipase is unremarkable.  Chest x-ray negative for any acute infectious etiology.   Patient is still tachycardic after 1 L fluid.  Will give his second additional liter fluid to rehydrate patient.  Attempted p.o. challenge patient in the department.  She was able to take a few sips down but then would eventually start spitting it up after 20-30 minutes.  Do not suspect food impaction at this time given that patient will at times be able to tolerate PO.  Suspect that patient is dehydrated will continue needing IVF for reversal of AKI and antiemetics.  Discussed patient with hospitalist. Will admit.   Final Clinical Impressions(s) / ED Diagnoses   Final diagnoses:  Acute kidney injury [facility])  Dehydration    ED Discharge Orders    None       Desma Mcgregor 10/22/17 2352    Davonna Belling, MD 10/22/17 [phone]

## 2017-10-22 NOTE — ED Notes (Signed)
Pt intermittently make grunting sounds appear to be intentional and dtr reports it is almost as if she has a tick.

## 2017-10-23 ENCOUNTER — Ambulatory Visit (INDEPENDENT_AMBULATORY_CARE_PROVIDER_SITE_OTHER): Payer: Medicare Other | Admitting: Physician Assistant

## 2017-10-23 ENCOUNTER — Encounter (HOSPITAL_COMMUNITY): Payer: Self-pay

## 2017-10-23 DIAGNOSIS — N179 Acute kidney failure, unspecified: Secondary | ICD-10-CM | POA: Diagnosis not present

## 2017-10-23 DIAGNOSIS — E860 Dehydration: Secondary | ICD-10-CM

## 2017-10-23 DIAGNOSIS — G43A1 Cyclical vomiting, intractable: Secondary | ICD-10-CM | POA: Diagnosis not present

## 2017-10-23 LAB — CBC
HEMATOCRIT: 38.2 % (ref 36.0–46.0)
Hemoglobin: 12.9 g/dL (ref 12.0–15.0)
MCH: 29.8 pg (ref 26.0–34.0)
MCHC: 33.8 g/dL (ref 30.0–36.0)
MCV: 88.2 fL (ref 78.0–100.0)
Platelets: 328 10*3/uL (ref 150–400)
RBC: 4.33 MIL/uL (ref 3.87–5.11)
RDW: 14.4 % (ref 11.5–15.5)
WBC: 16.8 10*3/uL — AB (ref 4.0–10.5)

## 2017-10-23 LAB — CREATININE, SERUM
Creatinine, Ser: 0.74 mg/dL (ref 0.44–1.00)
GFR calc non Af Amer: >60 mL/min (ref >60)

## 2017-10-23 LAB — MRSA PCR SCREENING: MRSA by PCR: NEGATIVE

## 2017-10-23 MED ORDER — ACETAMINOPHEN 650 MG RE SUPP: 650.0000 mg | Freq: Four times a day (QID) | RECTAL | Status: DC | PRN

## 2017-10-23 MED ORDER — ACETAMINOPHEN 325 MG PO TABS: 650.0000 mg | ORAL_TABLET | Freq: Four times a day (QID) | ORAL | Status: DC | PRN

## 2017-10-23 MED ORDER — SENNOSIDES-DOCUSATE SODIUM 8.6-50 MG PO TABS
1.0000 | ORAL_TABLET | Freq: Every evening | ORAL | Status: DC | PRN
  Administered 2017-10-23: 1 via ORAL

## 2017-10-23 MED ORDER — METOPROLOL TARTRATE 25 MG PO TABS
12.5000 mg | ORAL_TABLET | Freq: Two times a day (BID) | ORAL | Status: DC
  Administered 2017-10-23 – 2017-10-25 (×5): 12.5 mg via ORAL
  Filled 2017-10-23 (×5): qty 1

## 2017-10-23 MED ORDER — ENSURE ENLIVE PO LIQD
237.0000 mL | Freq: Three times a day (TID) | ORAL | Status: DC
  Administered 2017-10-23 – 2017-10-25 (×3): 237 mL via ORAL

## 2017-10-23 MED ORDER — ENOXAPARIN SODIUM 30 MG/0.3ML ~~LOC~~ SOLN
30.0000 mg | SUBCUTANEOUS | Status: DC
  Administered 2017-10-23 – 2017-10-25 (×3): 30 mg via SUBCUTANEOUS
  Filled 2017-10-23 (×3): qty 0.3

## 2017-10-23 MED ORDER — ONDANSETRON HCL 4 MG/2ML IJ SOLN
4.0000 mg | Freq: Four times a day (QID) | INTRAMUSCULAR | Status: AC
  Administered 2017-10-23 (×2): 4 mg via INTRAVENOUS
  Filled 2017-10-23 (×2): qty 2

## 2017-10-23 MED ORDER — SODIUM CHLORIDE 0.9 % IV SOLN
INTRAVENOUS | Status: DC
  Administered 2017-10-23 – 2017-10-25 (×3): via INTRAVENOUS

## 2017-10-23 MED ORDER — HYDRALAZINE HCL 20 MG/ML IJ SOLN: 10.0000 mg | Freq: Four times a day (QID) | INTRAMUSCULAR | Status: DC | PRN

## 2017-10-23 MED ORDER — AMLODIPINE BESYLATE 5 MG PO TABS
2.5000 mg | ORAL_TABLET | Freq: Every day | ORAL | Status: DC
  Administered 2017-10-23 – 2017-10-25 (×3): 2.5 mg via ORAL
  Filled 2017-10-23 (×3): qty 1

## 2017-10-23 MED ORDER — GABAPENTIN 400 MG PO CAPS
800.0000 mg | ORAL_CAPSULE | Freq: Two times a day (BID) | ORAL | Status: DC
  Administered 2017-10-23 – 2017-10-25 (×6): 800 mg via ORAL
  Filled 2017-10-23 (×6): qty 2

## 2017-10-23 MED ORDER — HYDROCOD POLST-CPM POLST ER 10-8 MG/5ML PO SUER
5.0000 mL | Freq: Every evening | ORAL | Status: DC | PRN
  Administered 2017-10-23: 5 mL via ORAL
  Filled 2017-10-23: qty 5

## 2017-10-23 NOTE — Progress Notes (Signed)
Patient Demographics:    Jenny Torres, is a 84 y.o. female, DOB - 15-Nov-1927, KGU:[id number]  Admit date - 10/22/2017   Admitting Physician Quintella Baton, MD  Outpatient Primary MD for the patient is Hoyt Koch, MD  LOS - 0   Chief Complaint  Patient presents with  . Nausea  . Emesis  . generalized weakness        Subjective:    [place] today has no fevers,  No chest pain,   nausea and spit ups persist, no frank emesis, patient passed bedside swallow, no dysuria  Assessment  & Plan :    Active Problems:   COPD (chronic obstructive pulmonary disease) (HCC)   Essential hypertension   Nausea & vomiting   Dehydration   Physical deconditioning  Brief Summary:- 84 year old with history of hypertension and cyclical vomiting syndrome admitted on 10/22/2017 with intractable emesis and dehydration with significantly increased BUN/creatinine ratio and leukocytosis    1)Dehydration/PreRenal Azotemia-patient passed a bedside swallow, okay to give oral liquids, increase IV fluids to 125 ml/hr, last known EF > 60 %, continue as needed Zofran for nausea and vomiting, patient with history of cyclical vomiting, continue to hold Lasix due to dehydration  2)HTN-continue amlodipine 2.5 mg daily, hydralazine as needed for elevated blood pressure,, metoprolol 12.5 twice daily  3)Leukocytosis- ????  Reactive, no fevers, no evidence of significant infection at this time, urine culture and blood cultures ordered and pending  4)Generalized Weakness/Debility-get physical therapy evaluation, patient is from an assisted living facility,   5) moderate to severe protein caloric malnutrition-continue nutritional supplements Code Status : Full  Disposition Plan  : from ALF, ??? Home Health PT  Consults  :  PT pending????   DVT Prophylaxis  :  Lovenox  Lab Results  Component Value Date   PLT 328  10/23/2017    Inpatient Medications  Scheduled Meds: . amLODipine  2.5 mg Oral Daily  . enoxaparin (LOVENOX) injection  30 mg Subcutaneous Q24H  . feeding supplement (ENSURE ENLIVE)  237 mL Oral TID BM  . gabapentin  800 mg Oral BID  . metoprolol tartrate  12.5 mg Oral BID  . ondansetron (ZOFRAN) IV  4 mg Intravenous Q6H   Continuous Infusions: . sodium chloride 50 mL/hr at 10/23/17 0300   PRN Meds:.acetaminophen **OR** acetaminophen, chlorpheniramine-HYDROcodone    Anti-infectives (From admission, onward)   None        Objective:   Vitals:   10/22/17 2306 10/23/17 0108 10/23/17 0237 10/23/17 1119  BP: (!) 148/91 120/74 (!) 144/88 (!) 171/84  Pulse: (!) 119 (!) 108 (!) 118 99  Resp: 20 16 20 18   Temp: 99.5 F (37.5 C)  97.7 F (36.5 C) 98 F (36.7 C)  TempSrc: Rectal  Oral Oral  SpO2: 95% 95% 96% 100%  Weight:   37.1 kg (81 lb 12.7 oz)   Height:   5\' 1"  (1.549 m)     Wt Readings from Last 3 Encounters:  10/23/17 37.1 kg (81 lb 12.7 oz)  10/14/17 39.4 kg (86 lb 12.8 oz)  09/10/17 38.2 kg (84 lb 1.9 oz)     Intake/Output Summary (Last 24 hours) at 10/23/2017 1241 Last data filed at 10/23/2017 6283 Gross per 24 hour  Intake  2178.33 ml  Output -  Net 2178.33 ml     Physical Exam  Gen:- Awake Alert, patient looks emanciated and  tired,  HEENT:- [place].AT, No sclera icterus Neck-Supple Neck,No JVD,.  Lungs-  CTAB  CV- S1, S2 normal Abd-  +ve B.Sounds, Abd Soft, No tenderness,    Extremity/Skin:- No  edema,    Psych-affect is appropriate Neuro-generalized weakness without new focal deficits    Data Review:   Micro Results Recent Results (from the past 240 hour(s))  MRSA PCR Screening     Status: None   Collection Time: 10/23/17  4:00 AM  Result Value Ref Range Status   MRSA by PCR NEGATIVE NEGATIVE Final    Comment:        The GeneXpert MRSA Assay (FDA approved for NASAL specimens only), is one component of a comprehensive MRSA  colonization surveillance program. It is not intended to diagnose MRSA infection nor to guide or monitor treatment for MRSA infections. Performed at [facility], [place] [address], [place], [place] [address]     Radiology Reports Dg Chest 2 View  Result Date: 10/22/2017 CLINICAL DATA:  Cough. EXAM: CHEST  2 VIEW COMPARISON:  Radiographs of September 06, 2017. FINDINGS: The heart size and mediastinal contours are within normal limits. Biapical scarring is noted. No acute pulmonary disease is noted. No pneumothorax or pleural effusion is noted. Hyperexpansion of the lungs is noted. The visualized skeletal structures are unremarkable. IMPRESSION: No active cardiopulmonary disease. Findings consistent with chronic obstructive pulmonary disease. Electronically Signed   By: Marijo Conception, M.D.   On: 10/22/2017 20:44     CBC Recent Labs  Lab 10/22/17 1928 10/23/17 0415  WBC 20.8* 16.8*  HGB 14.9 12.9  HCT 42.3 38.2  PLT 394 328  MCV 86.7 88.2  MCH 30.5 29.8  MCHC 35.2 33.8  RDW 14.3 14.4    Chemistries  Recent Labs  Lab 10/22/17 1928 10/23/17 0415  NA 142  --   K 3.9  --   CL 101  --   CO2 22  --   GLUCOSE 109*  --   BUN 63*  --   CREATININE 1.04* 0.74  CALCIUM 9.7  --   AST 35  --   ALT 17  --   ALKPHOS 106  --   BILITOT 2.9*  --    ------------------------------------------------------------------------------------------------------------------ No results for input(s): CHOL, HDL, LDLCALC, TRIG, CHOLHDL, LDLDIRECT in the last 72 hours.  No results found for: HGBA1C ------------------------------------------------------------------------------------------------------------------ No results for input(s): TSH, T4TOTAL, T3FREE, THYROIDAB in the last 72 hours.  Invalid input(s): FREET3 ------------------------------------------------------------------------------------------------------------------ No results for input(s): VITAMINB12, FOLATE, FERRITIN,  TIBC, IRON, RETICCTPCT in the last 72 hours.  Coagulation profile No results for input(s): INR, PROTIME in the last 168 hours.  No results for input(s): DDIMER in the last 72 hours.  Cardiac Enzymes No results for input(s): CKMB, TROPONINI, MYOGLOBIN in the last 168 hours.  Invalid input(s): CK ------------------------------------------------------------------------------------------------------------------ No results found for: BNP   Roxan Hockey M.D on 10/23/2017 at 12:41 PM  Between 7am to 7pm - Pager - [phone]  After 7pm go to [email or web address] - password [email or web address]  [facility] -  Office  [phone]   Voice Recognition Viviann Spare dictation system was used to create this note, attempts have been made to correct errors. Please contact the author with questions and/or clarifications.

## 2017-10-23 NOTE — Progress Notes (Signed)
Patient hospitalized during the time of this appointment.  I placed a no charge visit along with the diagnosis of dehydration on her chart today.  I was unable to see the patient secondary to hospitalization.

## 2017-10-24 DIAGNOSIS — J449 Chronic obstructive pulmonary disease, unspecified: Secondary | ICD-10-CM | POA: Diagnosis not present

## 2017-10-24 DIAGNOSIS — N179 Acute kidney failure, unspecified: Secondary | ICD-10-CM | POA: Diagnosis not present

## 2017-10-24 LAB — CBC
HEMATOCRIT: 31.1 % — AB (ref 36.0–46.0)
HEMOGLOBIN: 10.6 g/dL — AB (ref 12.0–15.0)
MCH: 30.5 pg (ref 26.0–34.0)
MCHC: 34.1 g/dL (ref 30.0–36.0)
MCV: 89.4 fL (ref 78.0–100.0)
Platelets: 220 10*3/uL (ref 150–400)
RBC: 3.48 MIL/uL — ABNORMAL LOW (ref 3.87–5.11)
RDW: 14.2 % (ref 11.5–15.5)
WBC: 8.3 10*3/uL (ref 4.0–10.5)

## 2017-10-24 LAB — BASIC METABOLIC PANEL
Anion gap: 6 (ref 5–15)
BUN: 24 mg/dL — ABNORMAL HIGH (ref 6–20)
CHLORIDE: 108 mmol/L (ref 101–111)
CO2: 25 mmol/L (ref 22–32)
CREATININE: 0.42 mg/dL — AB (ref 0.44–1.00)
Calcium: 8.1 mg/dL — ABNORMAL LOW (ref 8.9–10.3)
GFR calc non Af Amer: >60 mL/min (ref >60)
Glucose, Bld: 85 mg/dL (ref 65–99)
Potassium: 2.9 mmol/L — ABNORMAL LOW (ref 3.5–5.1)
Sodium: 139 mmol/L (ref 135–145)

## 2017-10-24 LAB — URINE CULTURE
Culture: NO GROWTH
Special Requests: NORMAL

## 2017-10-24 MED ORDER — POTASSIUM CHLORIDE CRYS ER 20 MEQ PO TBCR: 40.0000 meq | EXTENDED_RELEASE_TABLET | Freq: Once | ORAL | Status: DC

## 2017-10-24 MED ORDER — POTASSIUM CHLORIDE CRYS ER 20 MEQ PO TBCR
40.0000 meq | EXTENDED_RELEASE_TABLET | Freq: Once | ORAL | Status: AC
  Administered 2017-10-24: 40 meq via ORAL
  Filled 2017-10-24: qty 2

## 2017-10-24 MED ORDER — KCL IN DEXTROSE-NACL 40-5-0.45 MEQ/L-%-% IV SOLN
INTRAVENOUS | Status: DC
  Administered 2017-10-24: 1000 mL via INTRAVENOUS
  Administered 2017-10-25: 04:00:00 via INTRAVENOUS
  Filled 2017-10-24 (×4): qty 1000

## 2017-10-24 NOTE — Progress Notes (Signed)
Daughter [name] called stating she's very frustrated with a 20 year history of her mother being admitted for a cough and spitting up sputum. Daughter states she doesn't see her mother vomit and thinks her mother is being manipulating.[name] would like to know what the coughing and spitting up sputum is caused by. She is POA for her mother. [name] is a Pharmacist, hospital so it's difficult to reach her during the day, but you can leave her a message cell #[phone]. She would like to be notified when her mother is discharged and how to break the cycle of all her admissions.Her  Mother does not do what the MD tells her to due when she goes home and eventually ends up back at the ED.

## 2017-10-24 NOTE — Progress Notes (Signed)
Pt sat up in chair for a couple hours, tolerated well

## 2017-10-24 NOTE — Progress Notes (Signed)
Patient Demographics:    Jenny Torres, is a 84 y.o. female, DOB - 1928/06/19, RAQ:[id number]  Admit date - 10/22/2017   Admitting Physician Quintella Baton, MD  Outpatient Primary MD for the patient is Hoyt Koch, MD  LOS - 0   Chief Complaint  Patient presents with  . Nausea  . Emesis  . generalized weakness        Subjective:    [place] today has no fevers,  No chest pain,   nausea and spit ups persist, no frank emesis, patient passed bedside swallow, no dysuria  Assessment  & Plan :    Active Problems:   COPD (chronic obstructive pulmonary disease) (HCC)   Essential hypertension   Nausea & vomiting   Dehydration   Physical deconditioning  Brief Summary:- 84 year old with history of hypertension and cyclical vomiting syndrome admitted on 10/22/2017 with intractable emesis and dehydration with significantly increased BUN/creatinine ratio and Leukocytosis   Plan:- 1)Dehydration/PreRenal Azotemia-patient passed a bedside swallow, okay to give oral liquids, increase IV fluids to 125 ml/hr, last known EF > 60 %, continue as needed Zofran for nausea and vomiting, patient with history of cyclical vomiting, continue to hold Lasix due to dehydration  2)HTN-continue Amlodipine 2.5 mg daily, hydralazine as needed for elevated blood pressure,, metoprolol 12.5 twice daily  3)Leukocytosis- Now Resolved  ????  Reactive, no fevers, no evidence of significant infection at this time, urine culture and blood cultures neg  4)Generalized Weakness/Debility-get physical therapy evaluation, patient is from an assisted living facility,   5) moderate to severe protein caloric malnutrition-continue nutritional supplements  6)Hypokalemia- replace and recheck  Code Status : Full  Disposition Plan  : from ALF, ??? Home Health PT Vs SNF  Consults  :  PT pending????   DVT Prophylaxis  :   Lovenox  Lab Results  Component Value Date   PLT 220 10/24/2017    Inpatient Medications  Scheduled Meds: . amLODipine  2.5 mg Oral Daily  . enoxaparin (LOVENOX) injection  30 mg Subcutaneous Q24H  . feeding supplement (ENSURE ENLIVE)  237 mL Oral TID BM  . gabapentin  800 mg Oral BID  . metoprolol tartrate  12.5 mg Oral BID   Continuous Infusions: . sodium chloride 500 mL (10/24/17 1124)  . dextrose 5 % and 0.45 % NaCl with KCl 40 mEq/L 1,000 mL (10/24/17 1051)   PRN Meds:.acetaminophen **OR** acetaminophen, chlorpheniramine-HYDROcodone, hydrALAZINE, senna-docusate    Anti-infectives (From admission, onward)   None        Objective:   Vitals:   10/24/17 0452 10/24/17 1051 10/24/17 1058 10/24/17 1402  BP: 111/74 (!) 163/70 (!) 163/70 (!) 125/58  Pulse: 64 76  74  Resp: 16   16  Temp: 97.9 F (36.6 C)   98.5 F (36.9 C)  TempSrc: Oral   Oral  SpO2: 100%   100%  Weight:      Height:        Wt Readings from Last 3 Encounters:  10/23/17 37.1 kg (81 lb 12.7 oz)  10/14/17 39.4 kg (86 lb 12.8 oz)  09/10/17 38.2 kg (84 lb 1.9 oz)     Intake/Output Summary (Last 24 hours) at 10/24/2017 1732 Last data filed at 10/24/2017 1700 Gross per 24  hour  Intake 4902 ml  Output 1100 ml  Net 3802 ml     Physical Exam  Gen:- Awake Alert, patient looks emanciated and  tired,  HEENT:- [place].AT, No sclera icterus Neck-Supple Neck,No JVD,.  Lungs-  CTAB  CV- S1, S2 normal Abd-  +ve B.Sounds, Abd Soft, No tenderness,    Extremity/Skin:- No  edema,    Psych-affect is appropriate Neuro-generalized weakness without new focal deficits    Data Review:   Micro Results Recent Results (from the past 240 hour(s))  MRSA PCR Screening     Status: None   Collection Time: 10/23/17  4:00 AM  Result Value Ref Range Status   MRSA by PCR NEGATIVE NEGATIVE Final    Comment:        The GeneXpert MRSA Assay (FDA approved for NASAL specimens only), is one component of a comprehensive  MRSA colonization surveillance program. It is not intended to diagnose MRSA infection nor to guide or monitor treatment for MRSA infections. Performed at [facility], [place] [address] [place], [place] [address]   Urine Culture     Status: None   Collection Time: 10/23/17 12:33 PM  Result Value Ref Range Status   Specimen Description   Final    URINE, CATHETERIZED Performed at [facility] [address] [place], [place] [address]    Special Requests   Final    Normal Performed at [facility], [place] [address], [place], [place] [address]    Culture   Final    NO GROWTH Performed at [facility], [place] [address], [place], [place] [address]    Report Status 10/24/2017 FINAL  Final    Radiology Reports Dg Chest 2 View  Result Date: 10/22/2017 CLINICAL DATA:  Cough. EXAM: CHEST  2 VIEW COMPARISON:  Radiographs of September 06, 2017. FINDINGS: The heart size and mediastinal contours are within normal limits. Biapical scarring is noted. No acute pulmonary disease is noted. No pneumothorax or pleural effusion is noted. Hyperexpansion of the lungs is noted. The visualized skeletal structures are unremarkable. IMPRESSION: No active cardiopulmonary disease. Findings consistent with chronic obstructive pulmonary disease. Electronically Signed   By: Marijo Conception, M.D.   On: 10/22/2017 20:44     CBC Recent Labs  Lab 10/22/17 1928 10/23/17 0415 10/24/17 0413  WBC 20.8* 16.8* 8.3  HGB 14.9 12.9 10.6*  HCT 42.3 38.2 31.1*  PLT 394 328 220  MCV 86.7 88.2 89.4  MCH 30.5 29.8 30.5  MCHC 35.2 33.8 34.1  RDW 14.3 14.4 14.2    Chemistries  Recent Labs  Lab 10/22/17 1928 10/23/17 0415 10/24/17 0413  NA 142  --  139  K 3.9  --  2.9*  CL 101  --  108  CO2 22  --  25  GLUCOSE 109*  --  85  BUN 63*  --  24*  CREATININE 1.04* 0.74 0.42*  CALCIUM 9.7  --  8.1*  AST 35  --   --   ALT 17  --   --   ALKPHOS 106  --   --    BILITOT 2.9*  --   --    ------------------------------------------------------------------------------------------------------------------ No results for input(s): CHOL, HDL, LDLCALC, TRIG, CHOLHDL, LDLDIRECT in the last 72 hours.  No results found for: HGBA1C ------------------------------------------------------------------------------------------------------------------ No results for input(s): TSH, T4TOTAL, T3FREE, THYROIDAB in the last 72 hours.  Invalid input(s): FREET3 ------------------------------------------------------------------------------------------------------------------ No results for input(s): VITAMINB12, FOLATE, FERRITIN, TIBC, IRON,  RETICCTPCT in the last 72 hours.  Coagulation profile No results for input(s): INR, PROTIME in the last 168 hours.  No results for input(s): DDIMER in the last 72 hours.  Cardiac Enzymes No results for input(s): CKMB, TROPONINI, MYOGLOBIN in the last 168 hours.  Invalid input(s): CK ------------------------------------------------------------------------------------------------------------------ No results found for: BNP   Roxan Hockey M.D on 10/24/2017 at 5:32 PM  Between 7am to 7pm - Pager - [phone]  After 7pm go to [email or web address] - password [email or web address]  [facility] -  Office  [phone]   Voice Recognition Viviann Spare dictation system was used to create this note, attempts have been made to correct errors. Please contact the author with questions and/or clarifications.

## 2017-10-25 DIAGNOSIS — J449 Chronic obstructive pulmonary disease, unspecified: Secondary | ICD-10-CM | POA: Diagnosis not present

## 2017-10-25 DIAGNOSIS — N179 Acute kidney failure, unspecified: Secondary | ICD-10-CM | POA: Diagnosis not present

## 2017-10-25 LAB — BASIC METABOLIC PANEL
ANION GAP: 2 — AB (ref 5–15)
BUN: 7 mg/dL (ref 6–20)
CO2: 26 mmol/L (ref 22–32)
Calcium: 8.4 mg/dL — ABNORMAL LOW (ref 8.9–10.3)
Chloride: 109 mmol/L (ref 101–111)
Creatinine, Ser: 0.41 mg/dL — ABNORMAL LOW (ref 0.44–1.00)
GFR calc Af Amer: >60 mL/min (ref >60)
GLUCOSE: 109 mg/dL — AB (ref 65–99)
POTASSIUM: 4.8 mmol/L (ref 3.5–5.1)
Sodium: 137 mmol/L (ref 135–145)

## 2017-10-25 MED ORDER — ZALEPLON 5 MG PO CAPS: 5.0000 mg | ORAL_CAPSULE | Freq: Every evening | ORAL | 0 refills | Status: DC | PRN

## 2017-10-25 MED ORDER — SERTRALINE HCL 50 MG PO TABS: 50.0000 mg | ORAL_TABLET | Freq: Every day | ORAL | 1 refills | Status: DC

## 2017-10-25 MED ORDER — ALPRAZOLAM 0.25 MG PO TABS: 0.2500 mg | ORAL_TABLET | Freq: Three times a day (TID) | ORAL | 0 refills | Status: DC | PRN

## 2017-10-25 MED ORDER — ONDANSETRON 4 MG PO TBDP: 4.0000 mg | ORAL_TABLET | ORAL | 5 refills | Status: DC | PRN

## 2017-10-25 NOTE — Evaluation (Signed)
Physical Therapy Evaluation Patient Details Name: Jenny Torres MRN: [id number] DOB: [birth year] Today's Date: 10/25/2017   History of Present Illness  Patient is an 84 y/o female admitted with N&V, dehydration, leukocytosis and weakness.  PMH positive for anorexia, Raynauses's, MVP, peripheral neuropathy, HTN, hyperlipidemia, GERD, COPD, anxiety and depression.  Clinical Impression  Patient presents with decreased balance, decreased strength, decreased activity tolerance, and high fall risk.  Currently minguard to S level for safety.  Feel she can return to ILF with intermittent help for mobility.  Patient with history of falls and is fearful of falling, which can place her at higher fall risk.  However, she reports multiple contacts that can be with her when she is up on her feet.  Will benefit from HHPT at d/c.  PT to follow acutely if not d/c.     Follow Up Recommendations Home health PT;Supervision for mobility/OOB    Equipment Recommendations  None recommended by PT    Recommendations for Other Services       Precautions / Restrictions Precautions Precautions: Fall Precaution Comments: hasn't fallen in 3 years, but when she did fall had femur then pelvic fracture      Mobility  Bed Mobility Overal bed mobility: Needs Assistance Bed Mobility: Supine to Sit     Supine to sit: Supervision;HOB elevated     General bed mobility comments: increased time and effort, but no physical help given  Transfers Overall transfer level: Needs assistance Equipment used: None;Rolling walker (2 wheeled) Transfers: Stand Pivot Transfers;Sit to/from Stand Sit to Stand: Supervision Stand pivot transfers: Min assist       General transfer comment: assist for balance with stand pivot to [facility] no AD due to instability; from [facility] with RW S to stand for safety  Ambulation/Gait Ambulation/Gait assistance: Supervision;Min guard Ambulation Distance (Feet): 80 Feet Assistive device: Rolling  walker (2 wheeled) Gait Pattern/deviations: Step-to pattern;Step-through pattern;Decreased stride length;Trunk flexed;Shuffle     General Gait Details: initially sliding feet on floor with her shoes on, progressing to taking some steps as gaining confidence  Stairs            Wheelchair Mobility    Modified Rankin (Stroke Patients Only)       Balance Overall balance assessment: Needs assistance   Sitting balance-Leahy Scale: Good     Standing balance support: Bilateral upper extremity supported Standing balance-Leahy Scale: Poor Standing balance comment: UE support for balance; pt with history of falls about 3 years ago                             Pertinent Vitals/Pain Pain Assessment: No/denies pain    Home Living Family/patient expects to be discharged to:: Private residence Living Arrangements: Alone Available Help at Discharge: Family;Available PRN/intermittently Type of Home: Independent living facility([facility]) Home Access: Elevator     Home Layout: One level Home Equipment: Walker - 2 wheels;Shower seat;Grab bars - tub/shower;Grab bars - toilet      Prior Function           Comments: uses a walker due to history of falls about 3 years ago; still prepares breakfast for herself, eats one meal in dining hall at [place]: Right    Extremity/Trunk Assessment   Upper Extremity Assessment Upper Extremity Assessment: Generalized weakness    Lower Extremity Assessment Lower Extremity Assessment: Generalized weakness    Cervical / Trunk  Assessment Cervical / Trunk Assessment: Kyphotic;Other exceptions Cervical / Trunk Exceptions: forward flexed neck  Communication   Communication: No difficulties  Cognition Arousal/Alertness: Awake/alert Behavior During Therapy: WFL for tasks assessed/performed Overall Cognitive Status: Within Functional Limits for tasks assessed                                         General Comments General comments (skin integrity, edema, etc.): patient relates not sleeping due to anxiety with long history of her spouse with Alzheimers, and now her son with ALS.  Reports took Ambien, but now they think that is cause of some of her swallowing/couging/gagging issues    Exercises     Assessment/Plan    PT Assessment Patient needs continued PT services  PT Problem List Decreased strength;Decreased mobility;Decreased safety awareness;Decreased activity tolerance;Decreased balance;Decreased knowledge of use of DME       PT Treatment Interventions DME instruction;Functional mobility training;Balance training;Patient/family education;Gait training;Therapeutic activities;Therapeutic exercise    PT Goals (Current goals can be found in the Care Plan section)  Acute Rehab PT Goals Patient Stated Goal: To go home today PT Goal Formulation: With patient Time For Goal Achievement: 10/29/17 Potential to Achieve Goals: Good    Frequency Min 3X/week   Barriers to discharge Decreased caregiver support states has friends she can call to help and someone who has helped her in the past, but will be in and out    Co-evaluation               AM-PAC PT "6 Clicks" Daily Activity  Outcome Measure Difficulty turning over in bed (including adjusting bedclothes, sheets and blankets)?: None Difficulty moving from lying on back to sitting on the side of the bed? : A Little Difficulty sitting down on and standing up from a chair with arms (e.g., wheelchair, bedside commode, etc,.)?: Unable Help needed moving to and from a bed to chair (including a wheelchair)?: A Little Help needed walking in hospital room?: A Little Help needed climbing 3-5 steps with a railing? : A Little 6 Click Score: 17    End of Session Equipment Utilized During Treatment: Gait belt Activity Tolerance: Patient limited by fatigue Patient left: in bed;with call  bell/phone within reach   PT Visit Diagnosis: Other abnormalities of gait and mobility (R26.89);History of falling (Z91.81)    Time: 9833-8250 PT Time Calculation (min) (ACUTE ONLY): 34 min   Charges:   PT Evaluation $PT Eval Moderate Complexity: 1 Mod PT Treatments $Gait Training: 8-22 mins   PT G CodesMagda Torres, Jenny Torres 10/25/2017   Jenny Torres 10/25/2017, 10:26 AM

## 2017-10-25 NOTE — Discharge Summary (Signed)
Jenny Torres, is a 84 y.o. female  DOB 1928-05-08  MRN [id number].  Admission date:  10/22/2017  Admitting Physician  Quintella Baton, MD  Discharge Date:  10/25/2017   Primary MD  Hoyt Koch, MD  Recommendations for primary care physician for things to follow:   Admission Diagnosis  Dehydration [E86.0] Acute kidney injury [facility]) [N17.9]   Discharge Diagnosis  Dehydration [E86.0] Acute kidney injury ([place]) [N17.9]    Active Problems:   COPD (chronic obstructive pulmonary disease) ([place])   Essential hypertension   Nausea & vomiting   Dehydration   Physical deconditioning      Past Medical History:  Diagnosis Date  . Anorexia   . Anxiety   . Carotid artery occlusion   . COPD (chronic obstructive pulmonary disease) ([place])   . Depression   . Diverticula, colon   . Gastritis   . GERD (gastroesophageal reflux disease)   . Hemorrhoids, external   . HTN (hypertension)   . Hyperlipidemia   . Hyperlipidemia   . Idiopathic peripheral neuropathy    chronic  . MVP (mitral valve prolapse)    stable  . Osteoporosis   . Palpitations   . Polyp of colon   . Raynaud's disease     Past Surgical History:  Procedure Laterality Date  . CESAREAN SECTION     x2  . CHOLECYSTECTOMY    . INTRAMEDULLARY (IM) NAIL INTERTROCHANTERIC Right 02/11/2014       HPI  from the history and physical done on the day of admission:   HPI: This is a 84 year old female resident of [facility], she has a history of nausea and vomiting and has desolvable Zofran ordered as needed, for when this occurs.  Per patient's and ER attending her nausea has been ongoing all week.  For an unclear reason she has not been given Zofran and tonight she was quite weak and she was sent to the ER.  There is no reported of any fevers, chills, diarrhea.  In the ER the patient has a chronic, mild but hacking cough.  It is  nonproductive.  She appears to be hypocellular bathing and comfortable.  Patient states that this cough occurs whenever she is ill like this and nervous.  Cough also seems to be associated with intake.  Her daughter was initially present at bedside but was not here during my interview.  In the ER she did receive Zofran, but appears to have persistent nausea.  The hospitalists have been asked to admit.  Review of Systems:  The patient denies anorexia, fever, weight loss,, vision loss, decreased hearing, hoarseness, chest pain, syncope, dyspnea on exertion, peripheral edema, balance deficits, hemoptysis, nausea, cough abdominal pain, melena, hematochezia, severe indigestion/heartburn, hematuria, incontinence, genital sores, muscle weakness, suspicious skin lesions, transient blindness, difficulty walking, depression, unusual weight change, abnormal bleeding, enlarged lymph nodes, angioedema, and breast masses.          Hospital Course:    Brief Summary:- 84 year old with history of hypertension and cyclical vomiting syndrome  admitted on 10/22/2017 with intractable emesis and dehydration with significantly increased BUN/creatinine ratio and Leukocytosis   Plan:- 1)Dehydration/PreRenal Azotemia-patient passed a bedside swallow, tolerated oral intake well, did well with IV fluids as well, last known EF > 60 %,  we used as needed Zofran for nausea and vomiting, patient with history of cyclical vomiting, no further vomiting, during hospitalization we put Lasix on hold due to dehydration  2)HTN-stable, continue Amlodipine 2.5 mg daily,, metoprolol 12.5 twice daily  3)Leukocytosis- Now Resolved  ????  Reactive, no fevers, no evidence of significant infection at this time, urine culture and blood cultures negative  4)Generalized Weakness/Debility-patient is from an assisted living facility, doing well overall  5) moderate to severe protein caloric malnutrition-continue nutritional  supplements  6)Hypokalemia-resolved after replacement  Code Status : Full  Discharge Condition: stable  Diet and Activity recommendation:  As advised  Discharge Instructions    Discharge Instructions    Call MD for:  difficulty breathing, headache or visual disturbances   Complete by:  As directed    Call MD for:  persistant dizziness or light-headedness   Complete by:  As directed    Call MD for:  persistant nausea and vomiting   Complete by:  As directed    Call MD for:  severe uncontrolled pain   Complete by:  As directed    Diet - low sodium heart healthy   Complete by:  As directed    Discharge instructions   Complete by:  As directed    1)Take Medications as prescribed 2)Avoid dehydration, drink fluids and eat regularly   Increase activity slowly   Complete by:  As directed        Discharge Medications     Allergies as of 10/25/2017      Reactions   Lovastatin Other (See Comments)   unknown   Sulfamethoxazole Swelling   Swelling of the throat, mouth, tongue, nose   Darvocet [propoxyphene N-acetaminophen] Itching   Erythromycin Nausea And Vomiting   Upset stomach   Hydrocod Polst-cpm Polst Er Nausea Only   nausea   Penicillins Nausea And Vomiting   "CAN TOLERATE NOW" Has patient had a PCN reaction causing immediate rash, facial/tongue/throat swelling, SOB or lightheadedness with hypotension: No Has patient had a PCN reaction causing severe rash involving mucus membranes or skin necrosis: No Has patient had a PCN reaction that required hospitalization No Has patient had a PCN reaction occurring within the last 10 years: yes If all of the above answers are "NO", then may proceed with Cephalosporin use.   Theophyllines Other (See Comments)   Unknown reaction   Zovirax [acyclovir] Other (See Comments)   Per patient lots of side effects   Ceftin Diarrhea   GI problems   Paroxetine Hcl Other (See Comments)   jittery      Medication List    TAKE these  medications   ALPRAZolam 0.25 MG tablet Commonly known as:  XANAX Take 1 tablet (0.25 mg total) by mouth every 8 (eight) hours as needed for anxiety or sleep.   amLODipine 2.5 MG tablet Commonly known as:  NORVASC TAKE 1 TABLET DAILY   chlorpheniramine-HYDROcodone 10-8 MG/5ML Suer Commonly known as:  TUSSIONEX PENNKINETIC ER Take 5 mLs by mouth at bedtime as needed for cough.   feeding supplement (ENSURE ENLIVE) Liqd Take 237 mLs by mouth 3 (three) times daily between meals.   furosemide 20 MG tablet Commonly known as:  LASIX Take 0.5 tablets (10 mg total) by mouth daily.  Prn for swelling   gabapentin 800 MG tablet Commonly known as:  NEURONTIN TAKE 1 TABLET TWICE A DAY   metoprolol tartrate 25 MG tablet Commonly known as:  LOPRESSOR Take 0.5 tablets (12.5 mg total) 2 (two) times daily by mouth. Need annual visit for further refills   ondansetron 4 MG disintegrating tablet Commonly known as:  ZOFRAN ODT Take 1 tablet (4 mg total) by mouth every 4 (four) hours as needed for nausea or vomiting. What changed:  when to take this   sertraline 50 MG tablet Commonly known as:  ZOLOFT Take 1 tablet (50 mg total) by mouth daily. What changed:    medication strength  how much to take   zaleplon 5 MG capsule Commonly known as:  SONATA Take 1 capsule (5 mg total) by mouth at bedtime as needed for sleep.       Major procedures and Radiology Reports - PLEASE review detailed and final reports for all details, in brief -    Dg Chest 2 View  Result Date: 10/22/2017 CLINICAL DATA:  Cough. EXAM: CHEST  2 VIEW COMPARISON:  Radiographs of September 06, 2017. FINDINGS: The heart size and mediastinal contours are within normal limits. Biapical scarring is noted. No acute pulmonary disease is noted. No pneumothorax or pleural effusion is noted. Hyperexpansion of the lungs is noted. The visualized skeletal structures are unremarkable. IMPRESSION: No active cardiopulmonary disease. Findings  consistent with chronic obstructive pulmonary disease. Electronically Signed   By: Marijo Conception, M.D.   On: 10/22/2017 20:44    Micro Results   Recent Results (from the past 240 hour(s))  MRSA PCR Screening     Status: None   Collection Time: 10/23/17  4:00 AM  Result Value Ref Range Status   MRSA by PCR NEGATIVE NEGATIVE Final    Comment:        The GeneXpert MRSA Assay (FDA approved for NASAL specimens only), is one component of a comprehensive MRSA colonization surveillance program. It is not intended to diagnose MRSA infection nor to guide or monitor treatment for MRSA infections. Performed at [facility], [place] [address] [place], [place] [address]   Urine Culture     Status: None   Collection Time: 10/23/17 12:33 PM  Result Value Ref Range Status   Specimen Description   Final    URINE, CATHETERIZED Performed at [facility] [address] [place], [place] [address]    Special Requests   Final    Normal Performed at [facility], [place] [address], [place], [place] [address]    Culture   Final    NO GROWTH Performed at [facility], [place] [address] [place], [place] [address]    Report Status 10/24/2017 FINAL  Final    Today   Subjective    Jenny Torres today has no New concerns, she is eating and drinking well, ambulated okay, no chest pains no vomiting          Patient has been seen and examined prior to discharge   Objective   Blood pressure 133/76, pulse 76, temperature 98.6 F (37 C), temperature source Oral, resp. rate 16, height 5\' 1"  (1.549 m), weight 37.1 kg (81 lb 12.7 oz), SpO2 96 %.   Intake/Output Summary (Last 24 hours) at 10/25/2017 0858 Last data filed at 10/25/2017 0806 Gross per 24 hour  Intake 4307 ml  Output 1600 ml  Net 2707 ml    Exam Gen:- Awake  Alert, patient looks emanciated and  tired,  HEENT:- [place].AT, No sclera icterus Neck-Supple Neck,No JVD,.  Lungs-  CTAB   CV- S1, S2 normal Abd-  +ve B.Sounds, Abd Soft, No tenderness,    Extremity/Skin:- No  edema,    Psych-affect is appropriate Neuro-generalized weakness without new focal deficits    Data Review   CBC w Diff:  Lab Results  Component Value Date   WBC 8.3 10/24/2017   HGB 10.6 (L) 10/24/2017   HGB 11.9 04/15/2017   HCT 31.1 (L) 10/24/2017   HCT 36.4 04/15/2017   PLT 220 10/24/2017   PLT 332 04/15/2017   LYMPHOPCT 10 03/02/2017   MONOPCT 9 03/02/2017   EOSPCT 0 03/02/2017   BASOPCT 0 03/02/2017    CMP:  Lab Results  Component Value Date   NA 137 10/25/2017   NA 130 (L) 04/15/2017   K 4.8 10/25/2017   CL 109 10/25/2017   CO2 26 10/25/2017   BUN 7 10/25/2017   BUN 8 04/15/2017   CREATININE 0.41 (L) 10/25/2017   GLU 77 05/14/2015   PROT 8.2 (H) 10/22/2017   PROT 7.6 04/08/2017   ALBUMIN 4.5 10/22/2017   ALBUMIN 4.6 04/08/2017   BILITOT 2.9 (H) 10/22/2017   BILITOT 0.5 04/08/2017   ALKPHOS 106 10/22/2017   AST 35 10/22/2017   ALT 17 10/22/2017   Total Discharge time is about 33 minutes  Jenny Torres M.D on 10/25/2017 at 8:58 AM  [facility]   Office  [phone]  Voice Recognition Viviann Spare dictation system was used to create this note, attempts have been made to correct errors. Please contact the author with questions and/or clarifications.

## 2017-10-25 NOTE — Progress Notes (Signed)
PT recommendations gone over with pt at bedside. Pt states that [facility] has their own PT department called [facility]. This CM faxed HHPT orders to [facility] [phone]). Pt states she has private caregiver to bathe her at home. Pt states she also needs ambulance transport home. This CM filled out Medical Necessity form and printed for nursing staff to call [facility] when discharge complete. Marney Doctor RN,BSN,NCM [phone]

## 2017-10-26 ENCOUNTER — Telehealth: Payer: Self-pay | Admitting: *Deleted

## 2017-10-26 NOTE — Telephone Encounter (Signed)
Transition Care Management Follow-up Telephone Call   Date discharged? 10/25/17   How have you been since you were released from the hospital? Pt states she is trying to make it.. A little weak and not enough energy like she use to have   Do you understand why you were in the hospital? YES   Do you understand the discharge instructions? YES   Where were you discharged to? Home   Items Reviewed:  Medications reviewed: YES  Allergies reviewed: YES  Dietary changes reviewed: YES  Referrals reviewed: No referral needed   Functional Questionnaire:   Activities of Daily Living (ADLs):   She states she are independent in the following: bathing and hygiene, feeding, continence, grooming, toileting and dressing States they require assistance with the following: ambulation   Any transportation issues/concerns?: YES, she states she doesn't drive but she will have someone to bring her   Any patient concerns? NO   Confirmed importance and date/time of follow-up visits scheduled YES, appt 11/02/17  Provider Appointment booked with Dr. Sharlet Salina  Confirmed with patient if condition begins to worsen call PCP or go to the ER.  Patient was given the office number and encouraged to call back with question or concerns.  : YES

## 2017-10-27 ENCOUNTER — Other Ambulatory Visit: Payer: Self-pay | Admitting: Internal Medicine

## 2017-10-29 ENCOUNTER — Telehealth: Payer: Self-pay | Admitting: Internal Medicine

## 2017-10-29 LAB — CULTURE, BLOOD (ROUTINE X 2)
CULTURE: NO GROWTH
Culture: NO GROWTH
SPECIAL REQUESTS: ADEQUATE
Special Requests: ADEQUATE

## 2017-10-29 NOTE — Telephone Encounter (Signed)
Copied from [place]. Topic: Quick Communication - See Telephone Encounter >> Oct 29, 2017  9:25 AM Bea Graff, NT wrote: CRM for notification. See Telephone encounter for: Darlina Guys from [facility] calling to let Dr. Sharlet Salina know that a RN will be going out to see pt tomorrow to start her up with services. CB#: [phone]  10/29/17.

## 2017-11-02 ENCOUNTER — Inpatient Hospital Stay: Payer: Medicare Other | Admitting: Internal Medicine

## 2017-11-02 ENCOUNTER — Telehealth: Payer: Self-pay | Admitting: Internal Medicine

## 2017-11-02 NOTE — Telephone Encounter (Signed)
Copied from [place] [phone]. Topic: Quick Communication - See Telephone Encounter >> Nov 02, 2017  9:35 AM [place], Laban Emperor wrote: CRM for notification. See Telephone encounter for:  Jenny Torres with [facility] would like the provider to call her about the pt health.  Contact [phone]  11/02/17.

## 2017-11-02 NOTE — Telephone Encounter (Signed)
Verbals given  

## 2017-11-02 NOTE — Telephone Encounter (Signed)
Copied from [place]. Topic: Inquiry >> Nov 02, 2017  3:27 PM Conception Chancy, NT wrote: Terri Piedra is a physical therapist calling from [facility] requesting verbal orders for PT 2x a week for 6 weeks  [phone]

## 2017-11-02 NOTE — Telephone Encounter (Signed)
Fine but needs visit within 30 days of start of services in order to supervise.

## 2017-11-02 NOTE — Telephone Encounter (Signed)
Fine

## 2017-11-02 NOTE — Telephone Encounter (Signed)
Called Jenny Torres back all she Needs is verbal orders for home care and PT for the patient.

## 2017-11-02 NOTE — Telephone Encounter (Signed)
Notified Jenny Torres w/MD response../lmb 

## 2017-11-18 ENCOUNTER — Ambulatory Visit: Payer: Medicare Other | Admitting: Emergency Medicine

## 2017-12-10 ENCOUNTER — Telehealth: Payer: Self-pay | Admitting: Internal Medicine

## 2017-12-10 NOTE — Telephone Encounter (Signed)
Copied from [place] [phone]. Topic: Quick Communication - See Telephone Encounter >> Dec 10, 2017  9:33 AM Lolita Rieger, RMA wrote: CRM for notification. See Telephone encounter for: 12/10/17.[name] called from [facility] for verbal orders to continue PT please call [phone]

## 2017-12-13 NOTE — Telephone Encounter (Signed)
Spoke with [facility] to make them away the patient did not come for her last visit - as noted below by Dr.CRAWFORD.   She was unaware of that. She will speak with the patient and they will call back to set up an appointment. She stated the patient had been very sick and is just not getting up and walking around.

## 2017-12-13 NOTE — Telephone Encounter (Signed)
FYI

## 2017-12-13 NOTE — Telephone Encounter (Signed)
[name] from [facility] calling to check status on the verbal orders. CB#: [phone]

## 2017-12-13 NOTE — Telephone Encounter (Signed)
Cannot give as patient did not come in for face to face visit within 30 days of start of service

## 2017-12-16 ENCOUNTER — Ambulatory Visit (INDEPENDENT_AMBULATORY_CARE_PROVIDER_SITE_OTHER): Payer: Medicare Other | Admitting: Internal Medicine

## 2017-12-16 ENCOUNTER — Encounter: Payer: Self-pay | Admitting: Internal Medicine

## 2017-12-16 DIAGNOSIS — I7300 Raynaud's syndrome without gangrene: Secondary | ICD-10-CM | POA: Diagnosis not present

## 2017-12-16 DIAGNOSIS — I341 Nonrheumatic mitral (valve) prolapse: Secondary | ICD-10-CM | POA: Diagnosis not present

## 2017-12-16 DIAGNOSIS — E43 Unspecified severe protein-calorie malnutrition: Secondary | ICD-10-CM | POA: Diagnosis not present

## 2017-12-16 DIAGNOSIS — J449 Chronic obstructive pulmonary disease, unspecified: Secondary | ICD-10-CM | POA: Diagnosis not present

## 2017-12-16 DIAGNOSIS — R5381 Other malaise: Secondary | ICD-10-CM

## 2017-12-16 DIAGNOSIS — I872 Venous insufficiency (chronic) (peripheral): Secondary | ICD-10-CM

## 2017-12-16 DIAGNOSIS — F329 Major depressive disorder, single episode, unspecified: Secondary | ICD-10-CM

## 2017-12-16 DIAGNOSIS — K219 Gastro-esophageal reflux disease without esophagitis: Secondary | ICD-10-CM

## 2017-12-16 DIAGNOSIS — I10 Essential (primary) hypertension: Secondary | ICD-10-CM

## 2017-12-16 DIAGNOSIS — G609 Hereditary and idiopathic neuropathy, unspecified: Secondary | ICD-10-CM | POA: Diagnosis not present

## 2017-12-16 DIAGNOSIS — E860 Dehydration: Secondary | ICD-10-CM

## 2017-12-16 DIAGNOSIS — F419 Anxiety disorder, unspecified: Secondary | ICD-10-CM | POA: Diagnosis not present

## 2017-12-16 DIAGNOSIS — G4700 Insomnia, unspecified: Secondary | ICD-10-CM

## 2017-12-16 DIAGNOSIS — I6529 Occlusion and stenosis of unspecified carotid artery: Secondary | ICD-10-CM | POA: Diagnosis not present

## 2017-12-16 DIAGNOSIS — E785 Hyperlipidemia, unspecified: Secondary | ICD-10-CM

## 2017-12-16 DIAGNOSIS — M810 Age-related osteoporosis without current pathological fracture: Secondary | ICD-10-CM | POA: Diagnosis not present

## 2017-12-16 DIAGNOSIS — K635 Polyp of colon: Secondary | ICD-10-CM

## 2017-12-16 DIAGNOSIS — Z9181 History of falling: Secondary | ICD-10-CM

## 2017-12-16 NOTE — Progress Notes (Addendum)
   Subjective:    Patient ID: Jenny Torres, female    DOB: [birth year], 84 y.o.   MRN: [id number]  HPI The patient is an 84 YO female coming in for several concerns including post hospital (in for AKI and dehydration, given fluids and monitored, she is now feeling some better, doing therapy at home but got cut off as we could not monitor without face to face), and edema (taking amlodipine, has lasix as needed which did not help much, denies excessive salt intake, denies redness, swelling same in both legs, better with elevation or in the morning) and her insomnia (we changed to sonata last time, she is doing well with that, denies side effects, sleeping 6 hours or so at night time).   Review of Systems  Constitutional: Negative.   HENT: Negative.   Eyes: Negative.   Respiratory: Negative for cough, chest tightness and shortness of breath.   Cardiovascular: Positive for leg swelling. Negative for chest pain and palpitations.  Gastrointestinal: Negative for abdominal distention, abdominal pain, constipation, diarrhea, nausea and vomiting.  Musculoskeletal: Negative.   Skin: Negative.   Neurological: Negative.   Psychiatric/Behavioral: Negative.       Objective:   Physical Exam  Constitutional: She is oriented to person, place, and time. She appears well-developed and well-nourished.  thin  HENT:  Head: Normocephalic and atraumatic.  Eyes: EOM are normal.  Neck: Normal range of motion.  Cardiovascular: Normal rate and regular rhythm.  Pulmonary/Chest: Effort normal and breath sounds normal. No respiratory distress. She has no wheezes. She has no rales.  Abdominal: Soft. Bowel sounds are normal. She exhibits no distension. There is no tenderness. There is no rebound.  Musculoskeletal: She exhibits edema.  Trace pedal edema on exam  Neurological: She is alert and oriented to person, place, and time. Coordination normal.  Skin: Skin is warm and dry.  Psychiatric: She has a normal mood  and affect.   Vitals:   12/16/17 1050  BP: (!) 100/50  Pulse: 65  Temp: 97.7 F (36.5 C)  TempSrc: Oral  SpO2: 97%  Weight: 84 lb (38.1 kg)  Height: 5\' 1"  (1.549 m)      Assessment & Plan:

## 2017-12-16 NOTE — Patient Instructions (Addendum)
We will have you stop amlodipine (norvasc). Wait at least 2 weeks to decide if this helps the fluid in the legs.   If the fluid is not better you can try lasix (furosemide).   Consider finding a class on how to fall to help with the fear about that.

## 2017-12-17 ENCOUNTER — Other Ambulatory Visit: Payer: Self-pay | Admitting: Internal Medicine

## 2017-12-17 DIAGNOSIS — I872 Venous insufficiency (chronic) (peripheral): Secondary | ICD-10-CM | POA: Insufficient documentation

## 2017-12-17 NOTE — Assessment & Plan Note (Signed)
Needs to continue with home health PT.

## 2017-12-17 NOTE — Telephone Encounter (Signed)
Control database checked last refill: 11/02/2017 LOV: 12/16/2017

## 2017-12-17 NOTE — Assessment & Plan Note (Signed)
Okay to refill sonata as it is working well for her. Reminded about risk of dependence, falls, memory changes. She wishes to continue for QOL.

## 2017-12-17 NOTE — Assessment & Plan Note (Signed)
Suspect causing her pedal edema. She is taking amlodipine which can also cause leg swelling. Will stop the amlodipine as BP is borderline low. Also stop lasix as she had recent AKI with dehydration.

## 2017-12-17 NOTE — Assessment & Plan Note (Signed)
Will stop lasix to avoid this problem again.

## 2017-12-20 ENCOUNTER — Encounter: Payer: Self-pay | Admitting: Emergency Medicine

## 2017-12-20 ENCOUNTER — Ambulatory Visit (INDEPENDENT_AMBULATORY_CARE_PROVIDER_SITE_OTHER): Payer: Medicare Other | Admitting: Emergency Medicine

## 2017-12-20 DIAGNOSIS — J449 Chronic obstructive pulmonary disease, unspecified: Secondary | ICD-10-CM | POA: Diagnosis not present

## 2017-12-20 LAB — PULMONARY FUNCTION TEST
DL/VA % pred: 87 %
DL/VA: 3.72 ml/min/mmHg/L
DLCO unc % pred: 58 %
DLCO unc: 11.12 ml/min/mmHg
FEF 25-75 Post: 1.23 L/sec
FEF 25-75 Pre: 0.79 L/sec
FEF2575-%Change-Post: 55 %
FEF2575-%Pred-Post: 159 %
FEF2575-%Pred-Pre: 102 %
FEV1-%Change-Post: 11 %
FEV1-%Pred-Post: 115 %
FEV1-%Pred-Pre: 103 %
FEV1-Post: 1.49 L
FEV1-Pre: 1.34 L
FEV1FVC-%Change-Post: 7 %
FEV1FVC-%Pred-Pre: 96 %
FEV6-%Change-Post: 3 %
FEV6-%Pred-Post: 121 %
FEV6-%Pred-Pre: 117 %
FEV6-Post: 2.01 L
FEV6-Pre: 1.93 L
FEV6FVC-%Pred-Post: 107 %
FEV6FVC-%Pred-Pre: 107 %
FVC-%Change-Post: 3 %
FVC-%Pred-Post: 112 %
FVC-%Pred-Pre: 108 %
FVC-Post: 2.01 L
FVC-Pre: 1.93 L
Post FEV1/FVC ratio: 74 %
Post FEV6/FVC ratio: 100 %
Pre FEV1/FVC ratio: 69 %
Pre FEV6/FVC Ratio: 100 %
RV % pred: 130 %
RV: 3.06 L
TLC % pred: 109 %
TLC: 4.89 L

## 2017-12-20 NOTE — Patient Instructions (Signed)
Your pulmonary function testing shows some evidence for mild abnormal airflow that is probably related to your former tobacco use. We will hold off on starting an inhaled medication at this time.  If your breathing changes, worsens, becomes more bothersome then we could reconsider starting an inhaler Follow with Dr Lamonte Sakai in 12 months or sooner if you have any problems

## 2017-12-20 NOTE — Assessment & Plan Note (Signed)
Mild COPD confirmed on spirometry today.  We talked about the pros and cons of starting bronchodilator therapy.  Since she only has minimal symptoms we decided to defer for now.  If she becomes more symptomatic in the future we will reconsider.  Your pulmonary function testing shows some evidence for mild abnormal airflow that is probably related to your former tobacco use. We will hold off on starting an inhaled medication at this time.  If your breathing changes, worsens, becomes more bothersome then we could reconsider starting an inhaler Follow with Dr Lamonte Sakai in 12 months or sooner if you have any problems

## 2017-12-20 NOTE — Progress Notes (Signed)
Subjective:    Patient ID: Jenny Torres, female    DOB: 01-May-1928, 84 y.o.   MRN: [id number]  HPI 84 year old former smoker (25 pack years) with a history of hypertension, mitral valve prolapse, a positive PPD.  I seen her before in the past in the setting of a right middle lobe nodule.  Her QuantiFERON gold testing was negative and the nodule was stable so we never treated her with INH.   She is referred back for evaluation today for possible COPD. She had a CXR in 08/2017 that showed possible COPD, some biappical scarring, evidence change in her nodule. She has some exertional SOB, can happen with hills, stairs. She believes that this has been present for several years. No cough. No wheeze.   ROV 12/20/17 --follow-up visit for patient with a history of tobacco use, hypertension, mitral valve prolapse and a right middle lobe nodule (positive PPD).  I saw her recently again for possible COPD.  We performed pulmonary function testing today that I have reviewed.  This shows mild obstruction, no BD response. Normal volumes, decreased DLCO that corrects to normal for Va. She does have exertional SOB occasionally with her exercise class at her assisted living.   Review of Systems  Constitutional: Positive for appetite change. Negative for fever and unexpected weight change.  HENT: Positive for ear pain. Negative for congestion, dental problem, nosebleeds, postnasal drip, rhinorrhea, sinus pressure, sneezing, sore throat and trouble swallowing.   Eyes: Negative for redness and itching.  Respiratory: Positive for shortness of breath. Negative for cough, chest tightness and wheezing.   Cardiovascular: Positive for leg swelling. Negative for palpitations.  Gastrointestinal: Negative for nausea and vomiting.  Genitourinary: Negative for dysuria.  Musculoskeletal: Negative for joint swelling.  Skin: Negative for rash.  Allergic/Immunologic: Negative.  Negative for environmental allergies, food  allergies and immunocompromised state.  Neurological: Negative for headaches.  Hematological: Does not bruise/bleed easily.  Psychiatric/Behavioral: Negative for dysphoric mood. The patient is not nervous/anxious.    Past Medical History:  Diagnosis Date  . Anorexia   . Anxiety   . Carotid artery occlusion   . COPD (chronic obstructive pulmonary disease) ([place])   . Depression   . Diverticula, colon   . Gastritis   . GERD (gastroesophageal reflux disease)   . Hemorrhoids, external   . HTN (hypertension)   . Hyperlipidemia   . Hyperlipidemia   . Idiopathic peripheral neuropathy    chronic  . MVP (mitral valve prolapse)    stable  . Osteoporosis   . Palpitations   . Polyp of colon   . Raynaud's disease      Family History  Problem Relation Age of Onset  . Breast cancer Mother   . Liver disease Brother   . Cirrhosis Brother      Social History   Socioeconomic History  . Marital status: Married    Spouse name: Not on file  . Number of children: 2  . Years of education: Not on file  . Highest education level: Not on file  Occupational History  . Occupation: caregiver    Employer: RETIRED  Social Needs  . Financial resource strain: Patient refused  . Food insecurity:    Worry: Patient refused    Inability: Patient refused  . Transportation needs:    Medical: Patient refused    Non-medical: Patient refused  Tobacco Use  . Smoking status: Former Smoker    Packs/day: 1.00    Years: 25.00    Pack  years: 25.00    Types: Cigarettes    Last attempt to quit: 09/22/1983    Years since quitting: 34.2  . Smokeless tobacco: Never Used  Substance and Sexual Activity  . Alcohol use: Yes    Comment: rarely  . Drug use: No  . Sexual activity: Never  Lifestyle  . Physical activity:    Days per week: Patient refused    Minutes per session: Patient refused  . Stress: Not on file  Relationships  . Social connections:    Talks on phone: Patient refused    Gets together:  Patient refused    Attends religious service: Patient refused    Active member of club or organization: Patient refused    Attends meetings of clubs or organizations: Patient refused    Relationship status: Patient refused  . Intimate partner violence:    Fear of current or ex partner: Patient refused    Emotionally abused: Patient refused    Physically abused: Patient refused    Forced sexual activity: Patient refused  Other Topics Concern  . Not on file  Social History Narrative   Retired.      Allergies  Allergen Reactions  . Lovastatin Other (See Comments)    unknown  . Sulfamethoxazole Swelling    Swelling of the throat, mouth, tongue, nose  . Darvocet [Propoxyphene N-Acetaminophen] Itching  . Erythromycin Nausea And Vomiting    Upset stomach  . Hydrocod Polst-Cpm Polst Er Nausea Only    nausea  . Penicillins Nausea And Vomiting    "CAN TOLERATE NOW" Has patient had a PCN reaction causing immediate rash, facial/tongue/throat swelling, SOB or lightheadedness with hypotension: No Has patient had a PCN reaction causing severe rash involving mucus membranes or skin necrosis: No Has patient had a PCN reaction that required hospitalization No Has patient had a PCN reaction occurring within the last 10 years: yes If all of the above answers are "NO", then may proceed with Cephalosporin use.   . Theophyllines Other (See Comments)    Unknown reaction  . Zovirax [Acyclovir] Other (See Comments)    Per patient lots of side effects  . Ceftin Diarrhea    GI problems  . Paroxetine Hcl Other (See Comments)    jittery     Outpatient Medications Prior to Visit  Medication Sig Dispense Refill  . ALPRAZolam (XANAX) 0.25 MG tablet Take 1 tablet (0.25 mg total) by mouth every 8 (eight) hours as needed for anxiety or sleep. 30 tablet 0  . feeding supplement, ENSURE ENLIVE, (ENSURE ENLIVE) LIQD Take 237 mLs by mouth 3 (three) times daily between meals. 14220 mL 5  . furosemide (LASIX)  20 MG tablet Take 0.5 tablets (10 mg total) by mouth daily. Prn for swelling 20 tablet 0  . gabapentin (NEURONTIN) 800 MG tablet TAKE 1 TABLET TWICE A DAY 180 tablet 1  . metoprolol tartrate (LOPRESSOR) 25 MG tablet TAKE ONE-HALF (1/2) TABLET TWICE A DAY. NEED ANNUAL VISIT FOR FURTHER REFILLS 90 tablet 0  . ondansetron (ZOFRAN ODT) 4 MG disintegrating tablet Take 1 tablet (4 mg total) by mouth every 4 (four) hours as needed for nausea or vomiting. 30 tablet 5  . sertraline (ZOLOFT) 50 MG tablet Take 1 tablet (50 mg total) by mouth daily. 30 tablet 1  . zaleplon (SONATA) 5 MG capsule Take 1 capsule (5 mg total) by mouth at bedtime as needed for sleep. 14 capsule 0   No facility-administered medications prior to visit.  Objective:   Physical Exam Vitals:   12/20/17 1205  BP: 100/60  Pulse: 67  SpO2: 96%  Weight: 82 lb (37.2 kg)  Height: 5' (1.524 m)   Gen: Pleasant, thin woman, in no distress,  normal affect, using a walker  ENT: No lesions,  mouth clear,  oropharynx clear, no postnasal drip  Neck: No JVD, no stridor  Lungs: No use of accessory muscles, no crackles or wheezing  Cardiovascular: RRR, heart sounds normal, no murmur or gallops, no peripheral edema  Musculoskeletal: No deformities, no cyanosis or clubbing  Neuro: alert, non focal  Skin: Warm, no lesions or rash      Assessment & Plan:  COPD (chronic obstructive pulmonary disease) (HCC) Mild COPD confirmed on spirometry today.  We talked about the pros and cons of starting bronchodilator therapy.  Since she only has minimal symptoms we decided to defer for now.  If she becomes more symptomatic in the future we will reconsider.  Your pulmonary function testing shows some evidence for mild abnormal airflow that is probably related to your former tobacco use. We will hold off on starting an inhaled medication at this time.  If your breathing changes, worsens, becomes more bothersome then we could reconsider  starting an inhaler Follow with Dr Lamonte Sakai in 12 months or sooner if you have any problems  Baltazar Apo, MD, PhD 12/20/2017, 12:30 PM [facility] [phone] or if no answer [phone]

## 2017-12-20 NOTE — Telephone Encounter (Signed)
This is needing to go to :   [facility], [place] - [address]  [address] [place] [address]  Phone: [phone] Fax: [phone]

## 2017-12-20 NOTE — Progress Notes (Signed)
Patient completed full PFT today. 

## 2017-12-21 ENCOUNTER — Telehealth: Payer: Self-pay | Admitting: Internal Medicine

## 2017-12-21 NOTE — Telephone Encounter (Signed)
Fine

## 2017-12-21 NOTE — Telephone Encounter (Signed)
Copied from [place] [phone]. Topic: Quick Communication - See Telephone Encounter >> Dec 21, 2017 11:45 AM Conception Chancy, NT wrote: CRM for notification. See Telephone encounter for: 12/21/17.  [name] is calling from [facility] and is requesting verbal orders for physical therapy 2 week 1 and 1 week 1. Effected 12/19/17.  [name] cb# [phone]

## 2017-12-21 NOTE — Telephone Encounter (Signed)
LVM giving verbals 

## 2017-12-27 ENCOUNTER — Other Ambulatory Visit: Payer: Self-pay | Admitting: Internal Medicine

## 2018-01-04 ENCOUNTER — Telehealth: Payer: Self-pay | Admitting: Internal Medicine

## 2018-01-04 NOTE — Telephone Encounter (Signed)
Fine

## 2018-01-04 NOTE — Telephone Encounter (Signed)
[name] is calling from [facility] and is requesting verbal orders for physical therapy 2 week 1 and 1 week 1. Effected 12/19/17.  [name] cb# [phone] >> Jan 04, 2018 12:21 PM [name] wrote: [name] Human name: Jenny Torres  Relation to pt: PT from [facility]  Call back number: [phone]   Reason for call:  Requesting verbal orders for start of care home health PT, fax most recent office visit notes to fax [phone]

## 2018-01-04 NOTE — Telephone Encounter (Signed)
Called Gracie back no answer LMOPM w/MD response. Faxed last ov notes to # left below.Marland KitchenJohny Chess

## 2018-01-13 ENCOUNTER — Encounter: Payer: Medicare Other | Admitting: Internal Medicine

## 2018-01-13 ENCOUNTER — Ambulatory Visit: Payer: Medicare Other

## 2018-01-19 ENCOUNTER — Other Ambulatory Visit: Payer: Self-pay

## 2018-01-19 MED ORDER — SERTRALINE HCL 50 MG PO TABS: 50.0000 mg | ORAL_TABLET | Freq: Every day | ORAL | 1 refills | Status: DC

## 2018-01-22 ENCOUNTER — Other Ambulatory Visit: Payer: Self-pay | Admitting: Internal Medicine

## 2018-01-26 ENCOUNTER — Telehealth: Payer: Self-pay

## 2018-01-26 NOTE — Telephone Encounter (Signed)
Copied from [place] [phone]. Topic: Quick Communication - See Telephone Encounter >> Jan 25, 2018  4:03 PM Antonieta Iba C wrote: CRM for notification. See Telephone encounter for: 01/25/18.  [place] with [facility], called in because they received the referral to work with pt but they also need the pt's most recent OV notes, PPEval and treatment form.   Fax: [phone] Phone: [phone]

## 2018-01-26 NOTE — Telephone Encounter (Signed)
Left message asking Jenny Torres to call me back to advise what form she is needing---needs to talk with tamara,RN at [facility] office

## 2018-01-26 NOTE — Telephone Encounter (Signed)
I have printed the OV notes, but I do not know what the other stuff they are requiring.

## 2018-02-01 ENCOUNTER — Telehealth: Payer: Self-pay | Admitting: Internal Medicine

## 2018-02-01 NOTE — Telephone Encounter (Signed)
Fine

## 2018-02-01 NOTE — Telephone Encounter (Signed)
Copied from [place] [phone]. Topic: General - Other >> Feb 01, 2018  3:32 PM Yvette Rack wrote: Reason for CRM: [name] with [facility] called in requesting update on PT order. [name] requesting PT evaluation and treatment continuation. Cb# [phone].

## 2018-02-02 NOTE — Telephone Encounter (Signed)
Notified [name] w/MD response.../lmb 

## 2018-02-03 ENCOUNTER — Telehealth: Payer: Self-pay | Admitting: Internal Medicine

## 2018-02-03 NOTE — Telephone Encounter (Signed)
FYI

## 2018-02-03 NOTE — Telephone Encounter (Signed)
Copied from [place] [phone]. Topic: Inquiry >> Feb 03, 2018 10:50 AM Lennox Solders wrote: Reason for KY[facility]:CWCBJ  [facility] is calling to let md know they will be starting service on 02-07-18

## 2018-02-09 ENCOUNTER — Telehealth: Payer: Self-pay | Admitting: Internal Medicine

## 2018-02-09 NOTE — Telephone Encounter (Signed)
Copied from [place] [phone]. Topic: Quick Communication - See Telephone Encounter >> Feb 09, 2018  8:20 AM Ether Griffins B wrote: CRM for notification. See Telephone encounter for: 02/09/18.  Caitlin with [facility] calling for verbals on PT 3 x 4, 2 x 1. CB# [phone]

## 2018-02-09 NOTE — Telephone Encounter (Signed)
Notified Kaitlynn w./MD response.Marland KitchenJohny Torres

## 2018-02-09 NOTE — Telephone Encounter (Signed)
Fine

## 2018-03-02 DIAGNOSIS — G609 Hereditary and idiopathic neuropathy, unspecified: Secondary | ICD-10-CM | POA: Diagnosis not present

## 2018-03-02 DIAGNOSIS — M810 Age-related osteoporosis without current pathological fracture: Secondary | ICD-10-CM | POA: Diagnosis not present

## 2018-03-02 DIAGNOSIS — K219 Gastro-esophageal reflux disease without esophagitis: Secondary | ICD-10-CM

## 2018-03-02 DIAGNOSIS — J449 Chronic obstructive pulmonary disease, unspecified: Secondary | ICD-10-CM

## 2018-03-02 DIAGNOSIS — I341 Nonrheumatic mitral (valve) prolapse: Secondary | ICD-10-CM | POA: Diagnosis not present

## 2018-03-02 DIAGNOSIS — E43 Unspecified severe protein-calorie malnutrition: Secondary | ICD-10-CM

## 2018-03-02 DIAGNOSIS — I10 Essential (primary) hypertension: Secondary | ICD-10-CM | POA: Diagnosis not present

## 2018-03-02 DIAGNOSIS — I7300 Raynaud's syndrome without gangrene: Secondary | ICD-10-CM | POA: Diagnosis not present

## 2018-03-02 DIAGNOSIS — F329 Major depressive disorder, single episode, unspecified: Secondary | ICD-10-CM

## 2018-03-02 DIAGNOSIS — F419 Anxiety disorder, unspecified: Secondary | ICD-10-CM

## 2018-03-02 DIAGNOSIS — I872 Venous insufficiency (chronic) (peripheral): Secondary | ICD-10-CM | POA: Diagnosis not present

## 2018-03-02 DIAGNOSIS — I6529 Occlusion and stenosis of unspecified carotid artery: Secondary | ICD-10-CM | POA: Diagnosis not present

## 2018-03-02 DIAGNOSIS — E785 Hyperlipidemia, unspecified: Secondary | ICD-10-CM

## 2018-03-02 DIAGNOSIS — G4700 Insomnia, unspecified: Secondary | ICD-10-CM

## 2018-03-10 ENCOUNTER — Telehealth: Payer: Self-pay | Admitting: Internal Medicine

## 2018-03-10 NOTE — Telephone Encounter (Signed)
Fine

## 2018-03-10 NOTE — Telephone Encounter (Signed)
We would need visit for this.

## 2018-03-10 NOTE — Telephone Encounter (Signed)
Copied from [place] [phone]. Topic: General - Other >> Mar 10, 2018  2:45 PM Carolyn Stare wrote:  [name] with [facility] and is asking for verbal orders for continued PT 3 x 4 effective 03/13/18  [phone]

## 2018-03-10 NOTE — Telephone Encounter (Signed)
Notified [name] w/MD response. Nurse also states pt is wanting to can MD rx her an appetite stimulant. She states for the last 4-6 wks pt has had no appetite.Marland KitchenJohny Chess

## 2018-03-11 NOTE — Telephone Encounter (Signed)
Called [name] no answer LMOM w/MD response.Marland KitchenJohny Chess

## 2018-04-11 ENCOUNTER — Ambulatory Visit: Payer: Medicare Other | Admitting: Internal Medicine

## 2018-04-12 ENCOUNTER — Other Ambulatory Visit (INDEPENDENT_AMBULATORY_CARE_PROVIDER_SITE_OTHER): Payer: Medicare Other

## 2018-04-12 ENCOUNTER — Ambulatory Visit (INDEPENDENT_AMBULATORY_CARE_PROVIDER_SITE_OTHER): Payer: Medicare Other | Admitting: Internal Medicine

## 2018-04-12 ENCOUNTER — Encounter: Payer: Self-pay | Admitting: Internal Medicine

## 2018-04-12 VITALS — BP 102/70 | HR 69 | Temp 97.7°F | Ht 60.0 in | Wt 75.0 lb

## 2018-04-12 DIAGNOSIS — F5101 Primary insomnia: Secondary | ICD-10-CM

## 2018-04-12 DIAGNOSIS — Z0000 Encounter for general adult medical examination without abnormal findings: Secondary | ICD-10-CM | POA: Diagnosis not present

## 2018-04-12 DIAGNOSIS — R7989 Other specified abnormal findings of blood chemistry: Secondary | ICD-10-CM

## 2018-04-12 DIAGNOSIS — E43 Unspecified severe protein-calorie malnutrition: Secondary | ICD-10-CM

## 2018-04-12 DIAGNOSIS — E538 Deficiency of other specified B group vitamins: Secondary | ICD-10-CM | POA: Diagnosis not present

## 2018-04-12 DIAGNOSIS — F324 Major depressive disorder, single episode, in partial remission: Secondary | ICD-10-CM | POA: Diagnosis not present

## 2018-04-12 LAB — COMPREHENSIVE METABOLIC PANEL
ALT: 13 U/L (ref 0–35)
AST: 23 U/L (ref 0–37)
Albumin: 4.4 g/dL (ref 3.5–5.2)
Alkaline Phosphatase: 106 U/L (ref 39–117)
BILIRUBIN TOTAL: 0.8 mg/dL (ref 0.2–1.2)
BUN: 14 mg/dL (ref 6–23)
CALCIUM: 9.7 mg/dL (ref 8.4–10.5)
CO2: 29 meq/L (ref 19–32)
CREATININE: 0.64 mg/dL (ref 0.40–1.20)
Chloride: 95 mEq/L — ABNORMAL LOW (ref 96–112)
GFR: 93.05 mL/min (ref >60.00)
GLUCOSE: 87 mg/dL (ref 70–99)
Potassium: 4.8 mEq/L (ref 3.5–5.1)
SODIUM: 132 meq/L — AB (ref 135–145)
Total Protein: 7.6 g/dL (ref 6.0–8.3)

## 2018-04-12 LAB — CBC
HCT: 38.6 % (ref 36.0–46.0)
Hemoglobin: 13.2 g/dL (ref 12.0–15.0)
MCHC: 34.1 g/dL (ref 30.0–36.0)
MCV: 87.9 fl (ref 78.0–100.0)
PLATELETS: 284 10*3/uL (ref 150.0–400.0)
RBC: 4.39 Mil/uL (ref 3.87–5.11)
RDW: 13.7 % (ref 11.5–15.5)
WBC: 7.7 10*3/uL (ref 4.0–10.5)

## 2018-04-12 LAB — TSH: TSH: 2.81 u[IU]/mL (ref 0.35–4.50)

## 2018-04-12 LAB — VITAMIN D 25 HYDROXY (VIT D DEFICIENCY, FRACTURES): VITD: 28.11 ng/mL — AB (ref 30.00–100.00)

## 2018-04-12 LAB — LIPID PANEL
Cholesterol: 183 mg/dL (ref 0–200)
HDL: 70 mg/dL (ref >39.00)
LDL CALC: 97 mg/dL (ref 0–99)
NONHDL: 112.93
TRIGLYCERIDES: 78 mg/dL (ref 0.0–149.0)
Total CHOL/HDL Ratio: 3
VLDL: 15.6 mg/dL (ref 0.0–40.0)

## 2018-04-12 LAB — VITAMIN B12: Vitamin B-12: 144 pg/mL — ABNORMAL LOW (ref 211–911)

## 2018-04-12 MED ORDER — TRAZODONE HCL 50 MG PO TABS: 25.0000 mg | ORAL_TABLET | Freq: Every evening | ORAL | 3 refills | Status: DC | PRN

## 2018-04-12 MED ORDER — MIRTAZAPINE 30 MG PO TABS: 30.0000 mg | ORAL_TABLET | Freq: Every day | ORAL | 1 refills | Status: DC

## 2018-04-12 MED ORDER — ONDANSETRON 4 MG PO TBDP: 4.0000 mg | ORAL_TABLET | ORAL | 5 refills | Status: DC | PRN

## 2018-04-12 NOTE — Assessment & Plan Note (Signed)
Rx for trazodone. Been on sonatat (bad dreams stopped today), ambien (okay for some time but got severe GI side effects which caused hospitalization and will not prescribe again).

## 2018-04-12 NOTE — Assessment & Plan Note (Signed)
Checking B12 level. Has not been on replacement or injections for some time. Does have neuropathy which could be related.

## 2018-04-12 NOTE — Assessment & Plan Note (Signed)
Will change zoloft to remeron to help with appetite stimulant. It is unclear if she is having memory problems or depression as there is no family here for reference.

## 2018-04-12 NOTE — Patient Instructions (Addendum)
We are checking your labs today.  We have sent in trazodone for sleep to take at bedtime.  We will have you stop taking sertraline (zoloft). We are going to replace it with remeron (mirtazepine) that you will take at night time that will help you sleep and will also help with appetite.   Health Maintenance, Female Adopting a healthy lifestyle and getting preventive care can go a long way to promote health and wellness. Talk with your health care provider about what schedule of regular examinations is right for you. This is a good chance for you to check in with your provider about disease prevention and staying healthy. In between checkups, there are plenty of things you can do on your own. Experts have done a lot of research about which lifestyle changes and preventive measures are most likely to keep you healthy. Ask your health care provider for more information. Weight and diet Eat a healthy diet  Be sure to include plenty of vegetables, fruits, low-fat dairy products, and lean protein.  Do not eat a lot of foods high in solid fats, added sugars, or salt.  Get regular exercise. This is one of the most important things you can do for your health. ? Most adults should exercise for at least 150 minutes each week. The exercise should increase your heart rate and make you sweat (moderate-intensity exercise). ? Most adults should also do strengthening exercises at least twice a week. This is in addition to the moderate-intensity exercise.  Maintain a healthy weight  Body mass index (BMI) is a measurement that can be used to identify possible weight problems. It estimates body fat based on height and weight. Your health care provider can help determine your BMI and help you achieve or maintain a healthy weight.  For females 32 years of age and older: ? A BMI below 18.5 is considered underweight. ? A BMI of 18.5 to 24.9 is normal. ? A BMI of 25 to 29.9 is considered overweight. ? A BMI of 30  and above is considered obese.  Watch levels of cholesterol and blood lipids  You should start having your blood tested for lipids and cholesterol at 82 years of age, then have this test every 5 years.  You may need to have your cholesterol levels checked more often if: ? Your lipid or cholesterol levels are high. ? You are older than 82 years of age. ? You are at high risk for heart disease.  Cancer screening Lung Cancer  Lung cancer screening is recommended for adults 60-52 years old who are at high risk for lung cancer because of a history of smoking.  A yearly low-dose CT scan of the lungs is recommended for people who: ? Currently smoke. ? Have quit within the past 15 years. ? Have at least a 30-pack-year history of smoking. A pack year is smoking an average of one pack of cigarettes a day for 1 year.  Yearly screening should continue until it has been 15 years since you quit.  Yearly screening should stop if you develop a health problem that would prevent you from having lung cancer treatment.  Breast Cancer  Practice breast self-awareness. This means understanding how your breasts normally appear and feel.  It also means doing regular breast self-exams. Let your health care provider know about any changes, no matter how small.  If you are in your 20s or 30s, you should have a clinical breast exam (CBE) by a health care provider every  1-3 years as part of a regular health exam.  If you are 23 or older, have a CBE every year. Also consider having a breast X-ray (mammogram) every year.  If you have a family history of breast cancer, talk to your health care provider about genetic screening.  If you are at high risk for breast cancer, talk to your health care provider about having an MRI and a mammogram every year.  Breast cancer gene (BRCA) assessment is recommended for women who have family members with BRCA-related cancers. BRCA-related cancers  include: ? Breast. ? Ovarian. ? Tubal. ? Peritoneal cancers.  Results of the assessment will determine the need for genetic counseling and BRCA1 and BRCA2 testing.  Cervical Cancer Your health care provider may recommend that you be screened regularly for cancer of the pelvic organs (ovaries, uterus, and vagina). This screening involves a pelvic examination, including checking for microscopic changes to the surface of your cervix (Pap test). You may be encouraged to have this screening done every 3 years, beginning at age 71.  For women ages 51-65, health care providers may recommend pelvic exams and Pap testing every 3 years, or they may recommend the Pap and pelvic exam, combined with testing for human papilloma virus (HPV), every 5 years. Some types of HPV increase your risk of cervical cancer. Testing for HPV may also be done on women of any age with unclear Pap test results.  Other health care providers may not recommend any screening for nonpregnant women who are considered low risk for pelvic cancer and who do not have symptoms. Ask your health care provider if a screening pelvic exam is right for you.  If you have had past treatment for cervical cancer or a condition that could lead to cancer, you need Pap tests and screening for cancer for at least 20 years after your treatment. If Pap tests have been discontinued, your risk factors (such as having a new sexual partner) need to be reassessed to determine if screening should resume. Some women have medical problems that increase the chance of getting cervical cancer. In these cases, your health care provider may recommend more frequent screening and Pap tests.  Colorectal Cancer  This type of cancer can be detected and often prevented.  Routine colorectal cancer screening usually begins at 82 years of age and continues through 82 years of age.  Your health care provider may recommend screening at an earlier age if you have risk factors  for colon cancer.  Your health care provider may also recommend using home test kits to check for hidden blood in the stool.  A small camera at the end of a tube can be used to examine your colon directly (sigmoidoscopy or colonoscopy). This is done to check for the earliest forms of colorectal cancer.  Routine screening usually begins at age 30.  Direct examination of the colon should be repeated every 5-10 years through 82 years of age. However, you may need to be screened more often if early forms of precancerous polyps or small growths are found.  Skin Cancer  Check your skin from head to toe regularly.  Tell your health care provider about any new moles or changes in moles, especially if there is a change in a mole's shape or color.  Also tell your health care provider if you have a mole that is larger than the size of a pencil eraser.  Always use sunscreen. Apply sunscreen liberally and repeatedly throughout the day.  Protect  yourself by wearing long sleeves, pants, a wide-brimmed hat, and sunglasses whenever you are outside.  Heart disease, diabetes, and high blood pressure  High blood pressure causes heart disease and increases the risk of stroke. High blood pressure is more likely to develop in: ? People who have blood pressure in the high end of the normal range (130-139/85-89 mm Hg). ? People who are overweight or obese. ? People who are African American.  If you are 76-29 years of age, have your blood pressure checked every 3-5 years. If you are 55 years of age or older, have your blood pressure checked every year. You should have your blood pressure measured twice-once when you are at a hospital or clinic, and once when you are not at a hospital or clinic. Record the average of the two measurements. To check your blood pressure when you are not at a hospital or clinic, you can use: ? An automated blood pressure machine at a pharmacy. ? A home blood pressure monitor.  If  you are between 64 years and 74 years old, ask your health care provider if you should take aspirin to prevent strokes.  Have regular diabetes screenings. This involves taking a blood sample to check your fasting blood sugar level. ? If you are at a normal weight and have a low risk for diabetes, have this test once every three years after 82 years of age. ? If you are overweight and have a high risk for diabetes, consider being tested at a younger age or more often. Preventing infection Hepatitis B  If you have a higher risk for hepatitis B, you should be screened for this virus. You are considered at high risk for hepatitis B if: ? You were born in a country where hepatitis B is common. Ask your health care provider which countries are considered high risk. ? Your parents were born in a high-risk country, and you have not been immunized against hepatitis B (hepatitis B vaccine). ? You have HIV or AIDS. ? You use needles to inject street drugs. ? You live with someone who has hepatitis B. ? You have had sex with someone who has hepatitis B. ? You get hemodialysis treatment. ? You take certain medicines for conditions, including cancer, organ transplantation, and autoimmune conditions.  Hepatitis C  Blood testing is recommended for: ? Everyone born from [birth year] through 1963. ? Anyone with known risk factors for hepatitis C.  Sexually transmitted infections (STIs)  You should be screened for sexually transmitted infections (STIs) including gonorrhea and chlamydia if: ? You are sexually active and are younger than 82 years of age. ? You are older than 83 years of age and your health care provider tells you that you are at risk for this type of infection. ? Your sexual activity has changed since you were last screened and you are at an increased risk for chlamydia or gonorrhea. Ask your health care provider if you are at risk.  If you do not have HIV, but are at risk, it may be recommended  that you take a prescription medicine daily to prevent HIV infection. This is called pre-exposure prophylaxis (PrEP). You are considered at risk if: ? You are sexually active and do not regularly use condoms or know the HIV status of your partner(s). ? You take drugs by injection. ? You are sexually active with a partner who has HIV.  Talk with your health care provider about whether you are at high risk of being  infected with HIV. If you choose to begin PrEP, you should first be tested for HIV. You should then be tested every 3 months for as long as you are taking PrEP. Pregnancy  If you are premenopausal and you may become pregnant, ask your health care provider about preconception counseling.  If you may become pregnant, take 400 to 800 micrograms (mcg) of folic acid every day.  If you want to prevent pregnancy, talk to your health care provider about birth control (contraception). Osteoporosis and menopause  Osteoporosis is a disease in which the bones lose minerals and strength with aging. This can result in serious bone fractures. Your risk for osteoporosis can be identified using a bone density scan.  If you are 47 years of age or older, or if you are at risk for osteoporosis and fractures, ask your health care provider if you should be screened.  Ask your health care provider whether you should take a calcium or vitamin D supplement to lower your risk for osteoporosis.  Menopause may have certain physical symptoms and risks.  Hormone replacement therapy may reduce some of these symptoms and risks. Talk to your health care provider about whether hormone replacement therapy is right for you. Follow these instructions at home:  Schedule regular health, dental, and eye exams.  Stay current with your immunizations.  Do not use any tobacco products including cigarettes, chewing tobacco, or electronic cigarettes.  If you are pregnant, do not drink alcohol.  If you are  breastfeeding, limit how much and how often you drink alcohol.  Limit alcohol intake to no more than 1 drink per day for nonpregnant women. One drink equals 12 ounces of beer, 5 ounces of wine, or 1 ounces of hard liquor.  Do not use street drugs.  Do not share needles.  Ask your health care provider for help if you need support or information about quitting drugs.  Tell your health care provider if you often feel depressed.  Tell your health care provider if you have ever been abused or do not feel safe at home. This information is not intended to replace advice given to you by your health care provider. Make sure you discuss any questions you have with your health care provider. Document Released: 03/23/2011 Document Revised: 02/13/2016 Document Reviewed: 06/11/2015 Elsevier Interactive Patient Education  [employer].

## 2018-04-12 NOTE — Assessment & Plan Note (Signed)
Checking labs today, she is down about 7 pounds since last visit and admits to poor appetite. She is trying to get 1 boost daily but cost limits doing more of these. We talked about how her health could be affected by low weight and she wants to gain weight. Adding remeron for appetite stimulant today.

## 2018-04-12 NOTE — Progress Notes (Signed)
   Subjective:    Patient ID: Jenny Torres, female    DOB: [birth year], 84 y.o.   MRN: [id number]  HPI Here for medicare wellness and physical, a couple of complaints. Please see A/P for status and treatment of chronic medical problems.   Diet: poor Physical activity: sedentary Depression/mood screen: negative Hearing: intact to whispered voice, mild loss bilaterally Visual acuity: grossly normal, performs annual eye exam  ADLs: capable Fall risk: low Home safety: good Cognitive evaluation: intact to orientation, naming, recall and repetition EOL planning: adv directives discussed  I have personally reviewed and have noted 1. The patient's medical and social history - reviewed today no changes 2. Their use of alcohol, tobacco or illicit drugs 3. Their current medications and supplements 4. The patient's functional ability including ADL's, fall risks, home safety risks and hearing or visual impairment. 5. Diet and physical activities 6. Evidence for depression or mood disorders 7. Care team reviewed and updated (available in snapshot)  Review of Systems  Constitutional: Positive for activity change, appetite change, fatigue and unexpected weight change. Negative for chills and fever.  HENT: Negative.   Eyes: Negative.   Respiratory: Negative for cough, chest tightness and shortness of breath.   Cardiovascular: Negative for chest pain, palpitations and leg swelling.  Gastrointestinal: Negative for abdominal distention, abdominal pain, constipation, diarrhea, nausea and vomiting.  Musculoskeletal: Negative.   Skin: Negative.   Neurological: Negative.   Psychiatric/Behavioral: Positive for decreased concentration and sleep disturbance. Negative for dysphoric mood, hallucinations, self-injury and suicidal ideas. The patient is not nervous/anxious.       Objective:   Physical Exam  Constitutional: She is oriented to person, place, and time. She appears well-developed.  Gaunt,  temporal wasting  HENT:  Head: Normocephalic and atraumatic.  Eyes: EOM are normal.  Neck: Normal range of motion.  Cardiovascular: Normal rate and regular rhythm.  Pulmonary/Chest: Effort normal and breath sounds normal. No respiratory distress. She has no wheezes. She has no rales.  Abdominal: Soft. Bowel sounds are normal. She exhibits no distension. There is no tenderness. There is no rebound.  Musculoskeletal: She exhibits no edema.  Neurological: She is alert and oriented to person, place, and time. Coordination normal.  Skin: Skin is warm and dry.  Psychiatric:  Some confusion regarding prior medications.    Vitals:   04/12/18 1315  BP: 102/70  Pulse: 69  Temp: 97.7 F (36.5 C)  TempSrc: Oral  SpO2: 94%  Weight: 75 lb (34 kg)  Height: 5' (1.524 m)      Assessment & Plan:

## 2018-04-12 NOTE — Assessment & Plan Note (Signed)
Flu shot reminded yearly. Pneumonia complete. Shingrix declines. Tetanus declines. Colonoscopy aged out. Mammogram aged out, pap smear aged out and dexa declines due to many problems with allergies although prior osteoposis. Counseled about sun safety and mole surveillance. Counseled about the dangers of distracted driving. Given 10 year screening recommendations.

## 2018-04-15 ENCOUNTER — Telehealth: Payer: Self-pay | Admitting: Internal Medicine

## 2018-04-15 NOTE — Telephone Encounter (Signed)
Copied from [place] [id number]. Topic: Quick Communication - See Telephone Encounter >> Apr 15, 2018  1:43 PM Hewitt Shorts wrote: Pt is calling to let Dr. Sharlet Salina know that the trazodone  that she was given on 04/12/18 did not work to help her sleep What would be her next step   Best number [phone]

## 2018-04-15 NOTE — Telephone Encounter (Signed)
She needs to try it for about 1 week or so before we know if it will work. Try it for 1 week.

## 2018-04-19 ENCOUNTER — Ambulatory Visit (INDEPENDENT_AMBULATORY_CARE_PROVIDER_SITE_OTHER): Payer: Medicare Other

## 2018-04-19 DIAGNOSIS — E538 Deficiency of other specified B group vitamins: Secondary | ICD-10-CM

## 2018-04-19 MED ORDER — CYANOCOBALAMIN 1000 MCG/ML IJ SOLN
1000.0000 ug | INTRAMUSCULAR | Status: AC
  Administered 2018-04-19 – 2018-07-29 (×2): 1000 ug via INTRAMUSCULAR

## 2018-04-19 NOTE — Progress Notes (Signed)
Medical treatment/procedure(s) were performed by non-physician practitioner and as supervising physician I was immediately available for consultation/collaboration. I agree with above. Elizabeth A Crawford, MD  

## 2018-04-20 MED ORDER — RAMELTEON 8 MG PO TABS: 8.0000 mg | ORAL_TABLET | Freq: Every day | ORAL | 0 refills | Status: DC

## 2018-04-20 NOTE — Telephone Encounter (Signed)
Patient informed Rx was sent to pharmacy

## 2018-04-20 NOTE — Telephone Encounter (Signed)
Patient states that the trazodone is still not working and was causing bad dreams. Is wanting to know what the next step is or if there is an alternative to try

## 2018-04-20 NOTE — Telephone Encounter (Signed)
Have sent in rozerem for sleep to local pharmacy to try instead.

## 2018-04-21 NOTE — Telephone Encounter (Signed)
Patient is calling and states the new prescription that was called in is going to cost her $268. She states she can not afford that and is going to need something else. She states she feels as if Zaleplon is working for her. She states it was causing her to have terrible dreams but it does not anymore. She has been taking the 5mg  tablet and would like to know can this be called in. Please contact patient once a different medication has been called in.

## 2018-04-21 NOTE — Telephone Encounter (Signed)
Called patient and they stated that after finding out the price of the rozerem she went back to taking the Zaleplon and it has not given her any issues with bad dreams and would like to go back to taking that. Patient is aware that Provider is gone till Tuesday and states that is okay because she has enough of the Zaleplon to make it till Tuesday. Please advise thank you

## 2018-04-26 MED ORDER — ZALEPLON 5 MG PO CAPS: 5.0000 mg | ORAL_CAPSULE | Freq: Every evening | ORAL | 1 refills | Status: DC | PRN

## 2018-04-26 NOTE — Addendum Note (Signed)
Addended by: Pricilla Holm A on: 04/26/2018 12:03 PM   Modules accepted: Orders

## 2018-04-26 NOTE — Telephone Encounter (Signed)
Okay, zaleplon sent in for her to take.

## 2018-04-26 NOTE — Telephone Encounter (Signed)
Called patient back and she states that she is talking about the zaleplon. States that she had bad dreams with that medication as well and that is why she switched to trazodone but that was worse and was switched to rozerem but that is too expensive. Patient started taking the zaleplon again from some she had left over and it is working fine for her now no bad dreams. Is wanting to go back on it.

## 2018-04-26 NOTE — Telephone Encounter (Signed)
Can we clarify which medicine is working? The thing that she told us gave her bad dreams was trazodone not zalepon?

## 2018-04-27 ENCOUNTER — Other Ambulatory Visit: Payer: Self-pay | Admitting: Internal Medicine

## 2018-04-27 NOTE — Telephone Encounter (Signed)
Copied from [place] [id number]. Topic: Quick Communication - See Telephone Encounter >> Apr 27, 2018 11:45 AM Mylinda Latina, NT wrote: CRM for notification. See Telephone encounter for: 04/27/18. Patient states that she needs a weeks worth of her zaleplon (SONATA) 5 MG capsule sent to [facility]  until her full Rx comes in from [place], [place] - [address] [phone] (Phone) [phone] (Fax)

## 2018-04-27 NOTE — Telephone Encounter (Signed)
Patient requesting a weeks worth of her zaleplon (SONATA) 5 MG capsule sent to [facility], until [facility].  [place], [place] - [address]        [phone] (Phone) [phone] (Fax)

## 2018-04-28 ENCOUNTER — Telehealth: Payer: Self-pay | Admitting: Internal Medicine

## 2018-04-28 MED ORDER — ZALEPLON 5 MG PO CAPS: 5.0000 mg | ORAL_CAPSULE | Freq: Every evening | ORAL | 0 refills | Status: DC | PRN

## 2018-04-28 NOTE — Telephone Encounter (Signed)
Copied from [place] [id number]. Topic: Quick Communication - See Telephone Encounter >> Apr 28, 2018 10:33 AM Bea Graff, NT wrote: CRM for notification. See Telephone encounter for: 04/28/18. Baxter Flattery with [facility] is calling and states the pts insurance will not cover the zaleplon (SONATA) 5 MG capsule and they would like to see if either trazodone or  rozerem can be ordered in place of this medication? They state if not received by tomorrow the order will be cancelled.

## 2018-04-28 NOTE — Telephone Encounter (Signed)
PA started on CoverMyMeds KEY: [place] and let them know that a PA is going to be started for medication

## 2018-04-28 NOTE — Telephone Encounter (Signed)
Can you change pharmacy otherwise this will send to wrong location

## 2018-05-09 ENCOUNTER — Ambulatory Visit: Payer: Self-pay | Admitting: *Deleted

## 2018-05-09 NOTE — Telephone Encounter (Signed)
Patient informed of MD response and stated understanding  

## 2018-05-09 NOTE — Telephone Encounter (Signed)
We cannot prescribe ambien anymore. This caused severe GI problems which landed her in the hospital. She has sonata which is a sleeping medicine that she should take.

## 2018-05-09 NOTE — Telephone Encounter (Signed)
Outgoing call to patient who complains of not being able to sleep related to son who suffers with AlS who has taken a turn for the worst.   Patient states that she is" sick to the stomach , and vomiting from other sleeping medication prescribed.   States " it is destroying me" about the status of her son.  Ambien helped in the past.  Would like to request a Rx of Ambien called into Pharmacy.  Patient states Ambien works better than " the other medication. Has experienced vomiting, and  "sick on her stomach". Patient has no transportation would like home delivery by Pharmacist of [facility].  Patient is requesting it for tonight.    Thans states Patient.   Reason for Disposition . Requesting medication for sleep ("sleeping pill")  Answer Assessment - Initial Assessment Questions 1. DESCRIPTION: "Tell me about your sleeping problem."      stress 2. ONSET: "How long have you been having trouble sleeping?" (e.g., days, weeks, months)     3 years  3. RECURRENT: "Have you had sleeping problems before?"  If yes: "What happened that time?" "What helped your sleeping problem go away in the past?"      yes 4. STRESS: "Is there anything in your life that is making you feel stressed or tense?"     yes 5. PAIN: "Do you have any pain that is keeping you awake?" (e.g., back pain, headache, abdominal pain)     none 6. CAFFEINE ABUSE: "Do you drink caffeinated beverages, and how much each day?" (e.g., coffee, tea, colas)     no 7. SUBSTANCE ABUSE: "Do you use any illegal drugs or alcohol?"     na 8. OTHER SYMPTOMS: "Do you have any other symptoms?"  (e.g., difficulty breathing)     na  Protocols used: INSOMNIA-A-AH

## 2018-05-13 ENCOUNTER — Other Ambulatory Visit: Payer: Self-pay | Admitting: Internal Medicine

## 2018-05-13 MED ORDER — ZALEPLON 5 MG PO CAPS: 5.0000 mg | ORAL_CAPSULE | Freq: Every evening | ORAL | 0 refills | Status: DC | PRN

## 2018-05-13 NOTE — Telephone Encounter (Signed)
PA was denied for zaleplon will attempt an appeal

## 2018-05-13 NOTE — Telephone Encounter (Signed)
° ° ° °  Pt last pick up was 04/28/18 and it was for only 7 pills ,She said [facility] to [facility] and they never received the original RX that was sent in July . So pt is needing 2 RX . She need her reg RX sent to [facility] and then she need a 7 day supply sent to [facility].  Marland Kitchen

## 2018-05-13 NOTE — Telephone Encounter (Signed)
Pt called to have someone call her regarding the medication

## 2018-05-13 NOTE — Addendum Note (Signed)
Addended by: Pricilla Holm A on: 05/13/2018 03:44 PM   Modules accepted: Orders

## 2018-05-13 NOTE — Telephone Encounter (Signed)
[facility] to see if they received #90 with 1 refills zaleplon from 04/26/18 and if not call in verbal for this. I have sent in 7 day supply to [place]. In the future she should check prior to running out of meds if her prescriptions are received by mail order.

## 2018-05-13 NOTE — Telephone Encounter (Signed)
Control database checked last refill: 04/28/2018 LOV: 04/12/2018 NOV: none

## 2018-05-13 NOTE — Telephone Encounter (Signed)
On 04/26/18 she got a 6 month prescription and should not need refill until 10/27/18

## 2018-05-13 NOTE — Telephone Encounter (Signed)
Copied from [place] [id number]. Topic: Quick Communication - Rx Refill/Question >> May 13, 2018 11:50 AM Judyann Munson wrote: Medication: zaleplon (SONATA) 5 MG capsule Has the patient contacted their pharmacy?no  Preferred Pharmacy (with phone number or street name): [facility], [place] - [address] [phone] (Phone) [phone] (Fax)  Agent: Please be advised that RX refills may take up to 3 business days. We ask that you follow-up with your pharmacy.

## 2018-05-13 NOTE — Telephone Encounter (Signed)
PA has been denied for medication but I have sent an appeal

## 2018-05-18 NOTE — Telephone Encounter (Signed)
PA was approved 04/17/2018-05/17/2019

## 2018-05-20 ENCOUNTER — Ambulatory Visit: Payer: Medicare Other

## 2018-05-24 MED ORDER — ZALEPLON 5 MG PO CAPS: 5.0000 mg | ORAL_CAPSULE | Freq: Every evening | ORAL | 0 refills | Status: DC | PRN

## 2018-05-24 NOTE — Telephone Encounter (Signed)
Pt calling about RX Sonata she states that she need a 2 week supply per her insurance please call pt at [phone]

## 2018-05-24 NOTE — Telephone Encounter (Signed)
I got an approval letter for medication 06/18/2018

## 2018-05-24 NOTE — Telephone Encounter (Signed)
[place] [employer] called and stated that the appeal has been denied. It is going to be sent on for further review  Cb#  [phone]

## 2018-05-24 NOTE — Telephone Encounter (Signed)
Patient states that insurance has called her in regards to her sonata and even though Patient and I have received the approval letter from insurance and [facility] has ran it through, insurance is still not covering yet. Patient asking if she can get a 2 week supply of her sonata to [facility] as insurance told patient that should be enough time to figure out what is going on and that way patient wont have to keep calling every week for more since insurance is giving her a hard time.

## 2018-05-24 NOTE — Telephone Encounter (Signed)
Sent in 2 week supply.

## 2018-05-24 NOTE — Addendum Note (Signed)
Addended by: Pricilla Holm A on: 05/24/2018 04:34 PM   Modules accepted: Orders

## 2018-05-25 ENCOUNTER — Ambulatory Visit: Payer: Medicare Other

## 2018-05-27 ENCOUNTER — Telehealth: Payer: Self-pay | Admitting: Internal Medicine

## 2018-05-27 NOTE — Telephone Encounter (Signed)
Copied from [place] [id number]. Topic: Quick Communication - Rx Refill/Question >> May 27, 2018  5:37 PM Margot Ables wrote: Medication: zaleplon (SONATA) 5 MG capsule & mirtazapine (REMERON) 30 MG tablet  say to take at bedtime. Pt asking if she is to continue taking both. Please advise.

## 2018-05-30 NOTE — Telephone Encounter (Signed)
Yes, they are for different things.

## 2018-05-30 NOTE — Telephone Encounter (Signed)
LVM informing patient of MD response  

## 2018-06-01 ENCOUNTER — Ambulatory Visit (INDEPENDENT_AMBULATORY_CARE_PROVIDER_SITE_OTHER): Payer: Medicare Other | Admitting: *Deleted

## 2018-06-01 ENCOUNTER — Encounter: Payer: Self-pay | Admitting: *Deleted

## 2018-06-01 DIAGNOSIS — E538 Deficiency of other specified B group vitamins: Secondary | ICD-10-CM | POA: Diagnosis not present

## 2018-06-01 MED ORDER — CYANOCOBALAMIN 1000 MCG/ML IJ SOLN
1000.0000 ug | Freq: Once | INTRAMUSCULAR | Status: AC
  Administered 2018-06-01: 1000 ug via INTRAMUSCULAR

## 2018-06-01 NOTE — Progress Notes (Signed)
Medical treatment/procedure(s) were performed by non-physician practitioner and as supervising physician I was immediately available for consultation/collaboration. I agree with above. Elizabeth A Crawford, MD  

## 2018-06-28 ENCOUNTER — Encounter: Payer: Self-pay | Admitting: Internal Medicine

## 2018-06-28 ENCOUNTER — Ambulatory Visit (INDEPENDENT_AMBULATORY_CARE_PROVIDER_SITE_OTHER): Payer: Medicare Other | Admitting: Internal Medicine

## 2018-06-28 VITALS — BP 110/78 | HR 76 | Temp 97.7°F | Ht 60.0 in

## 2018-06-28 DIAGNOSIS — F5101 Primary insomnia: Secondary | ICD-10-CM | POA: Diagnosis not present

## 2018-06-28 DIAGNOSIS — E538 Deficiency of other specified B group vitamins: Secondary | ICD-10-CM

## 2018-06-28 MED ORDER — SUVOREXANT 15 MG PO TABS: 15.0000 mg | ORAL_TABLET | Freq: Every day | ORAL | 0 refills | Status: DC

## 2018-06-28 MED ORDER — CYANOCOBALAMIN 1000 MCG/ML IJ SOLN
1000.0000 ug | Freq: Once | INTRAMUSCULAR | Status: AC
  Administered 2018-06-28: 1000 ug via INTRAMUSCULAR

## 2018-06-28 NOTE — Patient Instructions (Signed)
We have sent in belsomra to try for sleep to see if this helps better.

## 2018-06-28 NOTE — Progress Notes (Signed)
   Subjective:    Patient ID: Jenny Torres, female    DOB: 08-02-1928, 84 y.o.   MRN: [id number]  HPI The patient is an 84 YO female coming in for persistent insomnia. She does have a son with lou gehrig's disease and this is bothering her emotionally some. She had taken ambien in the past which did well for some time and then caused stomach problems which led her to the hospital. Since that time she has not slept well. She tried trazodone which gave her side effects. She is currently using sonata which previously was letting her sleep about 2-3 hours. The last 2 nights she has not slept at all. She has tried ramelteon long ago and this did not do well. Denies change in caffeine or habits. Does not do relaxation exercises. Does not have a sound machine. She states last night when she did not sleep she was not tired and is not that tired today feeling. She has not slept well her entire life but worse in the last 5-10 years.   Review of Systems  Constitutional: Negative.   HENT: Negative.   Eyes: Negative.   Respiratory: Negative for cough, chest tightness and shortness of breath.   Cardiovascular: Negative for chest pain, palpitations and leg swelling.  Gastrointestinal: Negative for abdominal distention, abdominal pain, constipation, diarrhea, nausea and vomiting.  Musculoskeletal: Negative.   Skin: Negative.   Neurological: Negative.   Psychiatric/Behavioral: Positive for decreased concentration and sleep disturbance. The patient is nervous/anxious.       Objective:   Physical Exam  Constitutional: She is oriented to person, place, and time. She appears well-developed and well-nourished.  thin  HENT:  Head: Normocephalic and atraumatic.  Eyes: EOM are normal.  Neck: Normal range of motion.  Cardiovascular: Normal rate and regular rhythm.  Pulmonary/Chest: Effort normal and breath sounds normal. No respiratory distress. She has no wheezes. She has no rales.  Abdominal: Soft. Bowel  sounds are normal. She exhibits no distension. There is no tenderness. There is no rebound.  Musculoskeletal: She exhibits no edema.  Neurological: She is alert and oriented to person, place, and time. Coordination normal.  Skin: Skin is warm and dry.  Psychiatric:  Some anxious when talking about lack of sleep.    Vitals:   06/28/18 1307  BP: 110/78  Pulse: 76  Temp: 97.7 F (36.5 C)  TempSrc: Oral  SpO2: 97%  Height: 5' (1.524 m)      Assessment & Plan:  B12 shot given at visit

## 2018-06-29 NOTE — Assessment & Plan Note (Signed)
B12 shot given at visit.  

## 2018-06-29 NOTE — Assessment & Plan Note (Signed)
Due to change in family member health condition this is worsened to moderate to severe problem. She needs alternate therapy and will try belsomra for sleep. She also has not tried [place] which would be a next option. She is asking for Lorrin Mais again but given that it caused her to end up in the hospital would like to try all other options first to see if there could be another good fit.

## 2018-06-30 ENCOUNTER — Ambulatory Visit: Payer: Medicare Other

## 2018-07-01 ENCOUNTER — Telehealth: Payer: Self-pay

## 2018-07-01 ENCOUNTER — Ambulatory Visit: Payer: Medicare Other

## 2018-07-01 NOTE — Telephone Encounter (Signed)
PA started on CoverMyMeds KEY:  [id number]

## 2018-07-01 NOTE — Telephone Encounter (Signed)
PA approved 06/01/2018-07/01/2019

## 2018-07-14 ENCOUNTER — Ambulatory Visit: Payer: Self-pay

## 2018-07-14 NOTE — Telephone Encounter (Signed)
Rec'd call from pt.  Reported she lost her son 2 days ago, after a battle with ALS.  Stated she is very sad.  Is requesting advice on her medication.  Stated she has been taking Sonata, at hs, for sleep, but is only able to sleep about 2-3 hrs., and then wakes up.  Reported she has Mirtazapine for depression, but has not been taking this medication, due to the side effect of "terrible dreams."  In questioning about the Belsomra that Dr. Sharlet Salina recommended at last OV, stated she did not start this;   "the pharmacy usually delivers my medication, but I don't have that one."  The pt. questioned what Dr. Sharlet Salina recommends that she take for sleep, and in coping with her son's death.  Stated she feels she has good support through close friends, as well as her daughter.  Stated she would need some time to arrange transportation for an appointment.  Appt. Given for 10/28, per pt. Request.  Advised if she is able to arrange transportation for tomorrow morning, to call office 7:00-7:30 AM., to secure an appt.  Verb. Understanding.  Agrees with plan.              Reason for Disposition . [1] Depression AND [2] worsening (e.g.,sleeping poorly, less able to do activities of daily living)    Pt. Reported recent loss of her son; suffered with ALS and passed away on Dec 26, 2022.  Expressed significant sadness and questioned about medications for sleep and depression. Offered an appt. 10/25, however the pt. Has to arrange transportation, and preferred appt. On 10/28; appt. given.  Answer Assessment - Initial Assessment Questions 1. CONCERN: "What happened that made you call today?"     Lost her son to ALS, 2 days ago 2. DEPRESSION SYMPTOM SCREENING: "How are you feeling overall?" (e.g., decreased energy, increased sleeping or difficulty sleeping, difficulty concentrating, feelings of sadness, guilt, hopelessness, or worthlessness)     Verb. deep saddness; stated I don't cry a lot, but my heart cries; feels hopeless and  helpless at times; verb. feels she has strong faith and also feels she is strong. 3. RISK OF HARM - SUICIDAL IDEATION:  "Do you ever have thoughts of hurting or killing yourself?"  (e.g., yes, no, no but preoccupation with thoughts about death)   - INTENT:  "Do you have thoughts of hurting or killing yourself right NOW?" (e.g., yes, no, N/A)   - PLAN: "Do you have a specific plan for how you would do this?" (e.g., gun, knife, overdose, no plan, N/A)     n/a 4. RISK OF HARM - HOMICIDAL IDEATION:  "Do you ever have thoughts of hurting or killing someone else?"  (e.g., yes, no, no but preoccupation with thoughts about death)   - INTENT:  "Do you have thoughts of hurting or killing someone right NOW?" (e.g., yes, no, N/A)   - PLAN: "Do you have a specific plan for how you would do this?" (e.g., gun, knife, no plan, N/A)      n/a 5. FUNCTIONAL IMPAIRMENT: "How have things been going for you overall in your life? Have you had any more difficulties than usual doing your normal daily activities?"  (e.g., better, same, worse; self-care, school, work, interactions)     Able to take care of self 6. SUPPORT: "Who is with you now?" "Who do you live with?" "Do you have family or friends nearby who you can talk to?"      Yes, has a circle of  close friends and her daughter for support 7. THERAPIST: "Do you have a counselor or therapist? Name?"     n/a 8. STRESSORS: "Has there been any new stress or recent changes in your life?"     Recent loss of her son due to illness 9. DRUG ABUSE/ALCOHOL: "Do you drink alcohol or use any illegal drugs?"      N/a 10. OTHER: "Do you have any other health or medical symptoms right now?" (e.g., fever)       Did not ask  Protocols used: DEPRESSION-A-AH

## 2018-07-18 ENCOUNTER — Encounter: Payer: Self-pay | Admitting: Internal Medicine

## 2018-07-18 ENCOUNTER — Ambulatory Visit: Payer: Medicare Other | Admitting: Internal Medicine

## 2018-07-18 ENCOUNTER — Ambulatory Visit: Payer: Self-pay

## 2018-07-18 ENCOUNTER — Telehealth: Payer: Self-pay | Admitting: Internal Medicine

## 2018-07-18 VITALS — BP 120/80 | HR 87 | Temp 97.5°F | Ht 60.0 in | Wt 75.0 lb

## 2018-07-18 DIAGNOSIS — Z23 Encounter for immunization: Secondary | ICD-10-CM | POA: Diagnosis not present

## 2018-07-18 DIAGNOSIS — F331 Major depressive disorder, recurrent, moderate: Secondary | ICD-10-CM | POA: Diagnosis not present

## 2018-07-18 MED ORDER — SERTRALINE HCL 50 MG PO TABS: 50.0000 mg | ORAL_TABLET | Freq: Every day | ORAL | 1 refills | Status: DC

## 2018-07-18 NOTE — Telephone Encounter (Signed)
Copied from [place] [id number]. Topic: Quick Communication - See Telephone Encounter >> Jul 18, 2018  3:07 PM Percell Belt A wrote: CRM for notification. See Telephone encounter for: 07/18/18.  Pt called in and stated even with the PA the Suvorexant (BELSOMRA) 15 MG TABS [[id number]] is still $167.00 and she states she can not afford that.  She would like to know what her other option are?    Pharmacy - [facility] number - [phone]

## 2018-07-18 NOTE — Patient Instructions (Signed)
We have sent in zoloft (sertraline) to the mail order. You can take 50 mg daily for the next 1-2 weeks.   You have 50 mg pills (take 1 pill daily), and 25 mg pills (take 2 pills daily) that you can use first to use them up.   Call us back in 2 weeks or so and let us know if this is helping and if we need to change the dose.  Call the pharmacy for the sleeping medicine.

## 2018-07-18 NOTE — Progress Notes (Signed)
   Subjective:    Patient ID: Jenny Torres, female    DOB: Oct 18, 1927, 84 y.o.   MRN: [id number]  HPI The patient is an 84 YO female coming in for concerns about sadness. She recently lost her son (ALS) and is having a lot of problems. She stopped taking remeron a bit ago as she felt it was giving her poor dreams. She used to take zoloft 75 mg daily and was not sure if it was helping or not. This was changed to remeron to see if this could help with her sleeping more. She denies prior side effects with zoloft. She is moderately depressed. She is using sonata for sleep and has not gotten belsomra yet which was prescribed due to PA which has now been approved. Denies SI/HI. Knew her son was going to pass soon and is glad he is not suffering but missing him a lot and this has been very hard for her.   Review of Systems  Constitutional: Positive for appetite change and fatigue.  HENT: Negative.   Eyes: Negative.   Respiratory: Negative for cough, chest tightness and shortness of breath.   Cardiovascular: Negative for chest pain, palpitations and leg swelling.  Gastrointestinal: Negative for abdominal distention, abdominal pain, constipation, diarrhea, nausea and vomiting.  Musculoskeletal: Negative.   Skin: Negative.   Neurological: Negative.   Psychiatric/Behavioral: Positive for decreased concentration, dysphoric mood and sleep disturbance. Negative for agitation, behavioral problems, confusion, self-injury and suicidal ideas. The patient is nervous/anxious.       Objective:   Physical Exam  Constitutional: She is oriented to person, place, and time. She appears well-developed and well-nourished.  thin  HENT:  Head: Normocephalic and atraumatic.  Eyes: EOM are normal.  Neck: Normal range of motion.  Cardiovascular: Normal rate and regular rhythm.  Pulmonary/Chest: Effort normal and breath sounds normal. No respiratory distress. She has no wheezes. She has no rales.  Musculoskeletal: She  exhibits no edema.  Neurological: She is alert and oriented to person, place, and time. Coordination normal.  Skin: Skin is warm and dry.  Psychiatric:  Appropriately sad during visit   Vitals:   07/18/18 1002  BP: 120/80  Pulse: 87  Temp: (!) 97.5 F (36.4 C)  TempSrc: Oral  SpO2: 98%  Weight: 75 lb (34 kg)  Height: 5' (1.524 m)      Assessment & Plan:  Flu shot given at visit

## 2018-07-18 NOTE — Telephone Encounter (Signed)
Pt called with C/O redness and itching at the injection site of her Flu shot.  She had the immunization at 10 am today and now has redness and itching that she describes as localized and just around the injection site. She can feel a small lump at the site. Pt had been treating with a topical cream she has from another itchy rash she had. Care advice read to patient. Patient verbalized understanding of all instructions.  Reason for Disposition . Injection site reaction to any vaccine  Answer Assessment - Initial Assessment Questions 1. SYMPTOMS: "What is the main symptom?" (e.g., redness, swelling, pain)      redness itching  2. ONSET: "When was the vaccine (flu given?" "How much later did the itching and redness__ begin?" (e.g., hours, days ago)      A few minutes ago within the last hour 3. SEVERITY: "How bad is it?"      Itching unpleasant 4. FEVER: "Is there a fever?" If so, ask: "What is it, how was it measured, and when did it start?"      no 5. IMMUNIZATIONS GIVEN: "What shots have you recently received?"     flu 6. PAST REACTIONS: "Have you reacted to immunizations before?" If so, ask: "What happened?"     Maybe  7. OTHER SYMPTOMS: "Do you have any other symptoms?"     sm knot at injection site  Protocols used: IMMUNIZATION REACTIONS-A-AH

## 2018-07-18 NOTE — Assessment & Plan Note (Signed)
Severity is moderate and recurrent. On zoloft in the past with good control. She has stopped remeron on her own due to bad dreams. She has no psychotic features. D/C remeron and rx sertraline 50 mg daily. She will call with status in 2 weeks and can increase to 100 mg daily at that time.

## 2018-07-19 ENCOUNTER — Telehealth: Payer: Self-pay | Admitting: Internal Medicine

## 2018-07-19 NOTE — Addendum Note (Signed)
Addended by: Pricilla Holm A on: 07/19/2018 08:05 AM   Modules accepted: Orders

## 2018-07-19 NOTE — Telephone Encounter (Signed)
Pt returned call and message given to her regarding sleep medication and options from Dr. Sharlet Salina. Pt stated that she would like to try the ramelteon. And if it could be called in to [facility].

## 2018-07-19 NOTE — Telephone Encounter (Signed)
LVM with MD response  

## 2018-07-19 NOTE — Telephone Encounter (Signed)
LVM for patient to call back for MD response  

## 2018-07-19 NOTE — Telephone Encounter (Signed)
There really are not a lot of other options. She has had problems with trazodone, sonata she is not happy with, problems with ambien. We could try ramelteon which is another option. I think she has had problems with lunesta in the past. She could try taking benadryl over the counter to see if this can help with sleep.   Another option would be for her to do some research on sleep hygiene which are non-medication lifestyle changes that she can do to help with sleeping. This includes things like exercising every day during the day (not close to bedtime), no TV or electronic devices within 2-3 hours of bedtime, relaxation techniques and deep breathing techniques to help empty her brain at bedtime and help her relax naturally to sleep.

## 2018-07-19 NOTE — Telephone Encounter (Signed)
Pt wants to know if a higher dose of the zaleplon would help instead of a new medication all together? Please advise.

## 2018-07-19 NOTE — Telephone Encounter (Signed)
She is on maximum dose of sonata currently.

## 2018-07-29 ENCOUNTER — Ambulatory Visit (INDEPENDENT_AMBULATORY_CARE_PROVIDER_SITE_OTHER): Payer: Medicare Other

## 2018-07-29 DIAGNOSIS — E538 Deficiency of other specified B group vitamins: Secondary | ICD-10-CM

## 2018-07-29 DIAGNOSIS — Z23 Encounter for immunization: Secondary | ICD-10-CM | POA: Diagnosis not present

## 2018-08-12 NOTE — Telephone Encounter (Signed)
Based on her age I would not advise increase in dosing.

## 2018-08-12 NOTE — Telephone Encounter (Signed)
Tried calling patient, but phone would not go through. Not sure if it our phones or patients routing to [place] patient calls back

## 2018-08-12 NOTE — Telephone Encounter (Addendum)
Pt calling back today to states the pharmacy advised her there is a 10 mg zaleplon (SONATA)  And that is what she wants. Pt states she is only getting 2 hours sleep a night.  [place], [place] [phone] (Phone) [phone] (Fax)   Pt request refill as she only has 8 tabs left Pt would like a call back

## 2018-08-19 NOTE — Telephone Encounter (Addendum)
Pt following up on this request for increase on her sleeping med. Pt has decided to make an appt to discuss.  First available is Thursday.  Pt does not have enough of the zaleplon 5mg  to get to appt, is hoping she can get a few to get her through.  [facility], [place] - [address] [phone] (Phone) [phone] (Fax)

## 2018-08-22 NOTE — Telephone Encounter (Signed)
Is it okay to send in small supply till appointment

## 2018-08-23 ENCOUNTER — Ambulatory Visit: Payer: Medicare Other | Admitting: Internal Medicine

## 2018-08-23 ENCOUNTER — Encounter: Payer: Self-pay | Admitting: Internal Medicine

## 2018-08-23 ENCOUNTER — Ambulatory Visit (INDEPENDENT_AMBULATORY_CARE_PROVIDER_SITE_OTHER): Payer: Medicare Other | Admitting: Internal Medicine

## 2018-08-23 VITALS — BP 130/80 | HR 99 | Temp 97.9°F | Ht 60.0 in | Wt 75.0 lb

## 2018-08-23 DIAGNOSIS — F5101 Primary insomnia: Secondary | ICD-10-CM

## 2018-08-23 DIAGNOSIS — F331 Major depressive disorder, recurrent, moderate: Secondary | ICD-10-CM

## 2018-08-23 DIAGNOSIS — F411 Generalized anxiety disorder: Secondary | ICD-10-CM

## 2018-08-23 MED ORDER — ZOLPIDEM TARTRATE 5 MG PO TABS: 5.0000 mg | ORAL_TABLET | Freq: Every evening | ORAL | 0 refills | Status: DC | PRN

## 2018-08-23 NOTE — Progress Notes (Signed)
   Subjective:    Patient ID: Jenny Torres, female    DOB: Jun 29, 1928, 84 y.o.   MRN: [id number]  HPI The patient is an 84 YO female coming in for insomnia and moderate depression. Started back on zoloft 50 mg daily and is not sure that this is helping. She is taking it daily. Denies SI/HI. Just lost her son to ALS in the last several months and this is the first holiday without him. Is still taking sonata but this is not helping at all. She gets to sleep for 1-2 hours and then wakes for the rest of the night. Denies napping. Using to do well with ambien and then had some stomach problems which she thought were related. Wants to try again.   Review of Systems  Constitutional: Negative.   HENT: Negative.   Eyes: Negative.   Respiratory: Negative for cough, chest tightness and shortness of breath.   Cardiovascular: Negative for chest pain, palpitations and leg swelling.  Gastrointestinal: Negative for abdominal distention, abdominal pain, constipation, diarrhea, nausea and vomiting.  Musculoskeletal: Negative.   Skin: Negative.   Neurological: Negative.   Psychiatric/Behavioral: Positive for decreased concentration, dysphoric mood and sleep disturbance. Negative for self-injury and suicidal ideas. The patient is nervous/anxious.       Objective:   Physical Exam  Constitutional: She is oriented to person, place, and time. She appears well-developed and well-nourished.  Thin  HENT:  Head: Normocephalic and atraumatic.  Eyes: EOM are normal.  Neck: Normal range of motion.  Cardiovascular: Normal rate and regular rhythm.  Pulmonary/Chest: Effort normal and breath sounds normal. No respiratory distress. She has no wheezes. She has no rales.  Abdominal: Soft. Bowel sounds are normal. She exhibits no distension. There is no tenderness. There is no rebound.  Musculoskeletal: She exhibits no edema.  Neurological: She is alert and oriented to person, place, and time. Coordination normal.    Skin: Skin is warm and dry.  Psychiatric:  Flat affect   Vitals:   08/23/18 1454  BP: 130/80  Pulse: 99  Temp: 97.9 F (36.6 C)  TempSrc: Oral  SpO2: 93%  Weight: 75 lb (34 kg)  Height: 5' (1.524 m)      Assessment & Plan:

## 2018-08-23 NOTE — Patient Instructions (Signed)
We have sent in ambien to the pharmacy for a couple weeks to try again. Let us know if it bothers you.

## 2018-08-23 NOTE — Assessment & Plan Note (Signed)
Taking zoloft 50 mg daily and she wants to try sleeping medicine change first as she thinks this is impacting her mood more than depression.

## 2018-08-23 NOTE — Telephone Encounter (Signed)
Patient has an appointment this afternoon

## 2018-08-23 NOTE — Assessment & Plan Note (Addendum)
Taking zoloft 50 mg daily and she is not well controlled and will adjust sleep medication as sleeplessness is contributing. Moderate and recurrent.

## 2018-08-23 NOTE — Assessment & Plan Note (Signed)
Rx for ambien 5 mg to try. If she gets stomach problems we will have to stop. Can try sonata 10 mg daily if ambien has side effects.

## 2018-08-23 NOTE — Telephone Encounter (Signed)
Has she run out due to taking too much or she is actually due for refill?

## 2018-08-25 ENCOUNTER — Ambulatory Visit: Payer: Medicare Other | Admitting: Internal Medicine

## 2018-09-01 ENCOUNTER — Other Ambulatory Visit: Payer: Self-pay | Admitting: Internal Medicine

## 2018-09-01 NOTE — Telephone Encounter (Signed)
Copied from [place] [id number]. Topic: Quick Communication - Rx Refill/Question >> Sep 01, 2018  4:44 PM Keene Breath wrote: Medication: zolpidem (AMBIEN) 5 MG tablet  Patient called to request a refill for the above medication.  Patient also wanted the doctor to know that this medication is working fine for her.  Preferred Pharmacy (with phone number or street name): [place], [place] [phone] (Phone) [phone] (Fax)

## 2018-09-02 NOTE — Telephone Encounter (Signed)
Requested medication (s) are due for refill today: Yes  Requested medication (s) are on the active medication list: Yes  Last refill:  08/23/18  Future visit scheduled: No  Notes to clinic:  See request    Requested Prescriptions  Pending Prescriptions Disp Refills   zolpidem (AMBIEN) 5 MG tablet 15 tablet 0    Sig: Take 1 tablet (5 mg total) by mouth at bedtime as needed for sleep.     Not Delegated - Psychiatry:  Anxiolytics/Hypnotics Failed - 09/02/2018  7:18 AM      Failed - This refill cannot be delegated      Failed - Urine Drug Screen completed in last 360 days.      Passed - Valid encounter within last 6 months    Recent Outpatient Visits          1 week ago Primary insomnia   [place], Elizabeth A, MD   1 month ago Moderate episode of recurrent major depressive disorder [facility])   [place], Elizabeth A, MD   2 months ago Primary insomnia    [place], Elizabeth A, MD   4 months ago Routine general medical examination at a health care facility   [place], Elizabeth A, MD   8 months ago Dehydration   [facility] -Chuck Hint, MD

## 2018-09-05 ENCOUNTER — Other Ambulatory Visit: Payer: Self-pay

## 2018-09-05 MED ORDER — ZOLPIDEM TARTRATE 5 MG PO TABS: 5.0000 mg | ORAL_TABLET | Freq: Every evening | ORAL | 0 refills | Status: DC | PRN

## 2018-09-05 NOTE — Telephone Encounter (Signed)
FYI  I will start PA this week

## 2018-09-05 NOTE — Telephone Encounter (Signed)
No last refill on controlled database. Called pharmacy and keep getting stuck on hold and then when I did get someone got kicked off the call.

## 2018-09-05 NOTE — Telephone Encounter (Signed)
Copied from [place] [id number]. Topic: Quick Communication - Rx Refill/Question >> Sep 01, 2018  4:44 PM Keene Breath wrote: Medication: zolpidem (AMBIEN) 5 MG tablet  Patient called to request a refill for the above medication.  Patient also wanted the doctor to know that this medication is working fine for her.  Preferred Pharmacy (with phone number or street name): [place], [place] [phone] (Phone) [phone] (Fax) >> Sep 05, 2018 11:24 AM Yvette Rack wrote: Pt stated that a prior authorization is needed for the zolpidem (AMBIEN) 5 MG tablet. Pt stated the representative from [facility] told her that the first time is should be ordered locally. Pt provided phone# [phone] for the prior authorization. Pt requests prescription to be sent to [facility]. Cb# [phone]

## 2018-09-06 ENCOUNTER — Other Ambulatory Visit: Payer: Self-pay | Admitting: Internal Medicine

## 2018-09-06 MED ORDER — ZOLPIDEM TARTRATE 5 MG PO TABS: 5.0000 mg | ORAL_TABLET | Freq: Every evening | ORAL | 0 refills | Status: DC | PRN

## 2018-09-06 NOTE — Telephone Encounter (Addendum)
Pt wanted to know if a partial rx could be sent to [facility]  until she receives from [facility]. Stated she only has 1 tablet left. Please let pt know. CB# [phone]  [facility] [place], [place] - [address] [phone] (Phone) [phone] (Fax)

## 2018-09-06 NOTE — Addendum Note (Signed)
Addended by: Pricilla Holm A on: 09/06/2018 04:36 PM   Modules accepted: Orders

## 2018-09-06 NOTE — Telephone Encounter (Signed)
Sent in to local

## 2018-09-15 ENCOUNTER — Telehealth: Payer: Self-pay

## 2018-09-15 ENCOUNTER — Other Ambulatory Visit: Payer: Self-pay | Admitting: Internal Medicine

## 2018-09-15 MED ORDER — ZOLPIDEM TARTRATE 5 MG PO TABS: 5.0000 mg | ORAL_TABLET | Freq: Every evening | ORAL | 0 refills | Status: DC | PRN

## 2018-09-15 NOTE — Telephone Encounter (Signed)
Patient wanting to know if a small supply can be sent in till [facility] gets in next week. Patient down to two tabs

## 2018-09-15 NOTE — Telephone Encounter (Signed)
PA started on CoverMyMeds KEY: [id number]

## 2018-09-15 NOTE — Telephone Encounter (Signed)
PA denied because zolpidem is a benefit exclusion

## 2018-09-15 NOTE — Telephone Encounter (Signed)
Copied from [place] [id number]. Topic: Quick Communication - Rx Refill/Question >> Sep 15, 2018  9:58 AM Leward Quan A wrote: Medication: zolpidem (AMBIEN) 5 MG tablet  Patient ask for few pills to last until next week when medication should be delivered from [place]. Has 2 tabs left  Has the patient contacted their pharmacy? Yes.   (Agent: If no, request that the patient contact the pharmacy for the refill.) (Agent: If yes, when and what did the pharmacy advise?)  Preferred Pharmacy (with phone number or street name): [place], [place] - [address] [phone] (Phone) [phone] (Fax)    Agent: Please be advised that RX refills may take up to 3 business days. We ask that you follow-up with your pharmacy.

## 2018-09-15 NOTE — Telephone Encounter (Signed)
Requested medication (s) are due for refill today: yes  Requested medication (s) are on the active medication list: yes    Last refill: 09/06/18  #10  0 refills  Future visit scheduled no  Notes to clinic:not delegated  Requested Prescriptions  Pending Prescriptions Disp Refills   zolpidem (AMBIEN) 5 MG tablet 10 tablet 0    Sig: Take 1 tablet (5 mg total) by mouth at bedtime as needed for sleep.     Not Delegated - Psychiatry:  Anxiolytics/Hypnotics Failed - 09/15/2018 10:05 AM      Failed - This refill cannot be delegated      Failed - Urine Drug Screen completed in last 360 days.      Passed - Valid encounter within last 6 months    Recent Outpatient Visits          3 weeks ago Primary insomnia   [place], Elizabeth A, MD   1 month ago Moderate episode of recurrent major depressive disorder [facility])   [place], Elizabeth A, MD   2 months ago Primary insomnia   [place], Elizabeth A, MD   5 months ago Routine general medical examination at a health care facility   [place], Elizabeth A, MD   9 months ago Dehydration   [facility] -Chuck Hint, MD

## 2018-09-26 ENCOUNTER — Other Ambulatory Visit: Payer: Self-pay | Admitting: Internal Medicine

## 2018-09-27 NOTE — Telephone Encounter (Addendum)
Patient is currently out of this medication.  Would like to know what is the next step?  Is there samples to keep patient on this med?, patient assistance through the company? Or does patient need to be prescribed something else?  Please follow up with patient in regard.

## 2018-09-27 NOTE — Telephone Encounter (Signed)
If patient is not going to fill long term we will not keep sending in week supplies. Does she want a 30 day sent to local and we can cancel 90 day to long term? I would recommend same advice as previous note she can buy medication and if covered by insurance later can get reimbursed. We only send in short term in emergencies when people are not able to order their medication in time for refills to arrive and this is not a long term solution.

## 2018-09-27 NOTE — Telephone Encounter (Signed)
Contacted patient and had a lengthy conversation about her zolpidem. Patient informed me obout another medicare card to do her PA with, I told patient that I could get to the PA today since I did not have that information when I initially did the PA with her insurance card. Patient was wanting a small supply sent to [facility] as she is out of medication and wanted a call back. Patient and I went back and forth about the possibility of Dr. Sharlet Salina not sending another small supply sent in as it was already done twice before. I informed pateint that I would inform the doctor about the mix up in PA/insurance information and see what she decided. Patient was addiment that she was out of her medication and needed a small supply and kept asking if it could be sent today. I informed patient 3 separate times that I could send a note to the doctor but I could not promise anything. The patient then proceeded to end the call by saying in a louder tone "I know you cannot promise a d@mn  thing you already said that multiple times" patient then hung up on me. Dr. Sharlet Salina informed of situation and stated will not send in another small supply as it has been done twice before and that is more than what is usually done. PA will be done tomorrow afternoon

## 2018-09-27 NOTE — Telephone Encounter (Signed)
Patient can choose to buy this medication out of pocket or not. We cannot overrule this. She could consider a different drug plan for next year in the fall. Try goodrx for coupons to help her save on this medication.

## 2018-09-28 NOTE — Telephone Encounter (Addendum)
Routing to dr crawford and briana----patient advised of PA approval and Dr Charlynne Cousins note---she would like a 30 day supply sent to [facility] and request for pharmacy to deliver to her ---she would like 90 day supply cancelled---patient advised she cannot ask for short term supplies of this medication as we go forward---routing to dr crawford, --I have already cancelled [facility] supply [facility] and requested 30 day supply from [facility] to be delivered to patient's home--per pharm, they will deliver medication tomorrow 09/29/18--patient advised of delivery schedule by pharmacy

## 2018-09-28 NOTE — Telephone Encounter (Signed)
Went to do another PA on medication with new information and it states is covered by plan no PA needed.  Can you please call patient for me with MD response and with PA response. Thank you

## 2018-09-29 NOTE — Telephone Encounter (Signed)
noted 

## 2018-11-04 ENCOUNTER — Other Ambulatory Visit: Payer: Self-pay | Admitting: Internal Medicine

## 2018-11-04 NOTE — Telephone Encounter (Signed)
Copied from [place] [id number]. Topic: Quick Communication - Rx Refill/Question >> Nov 04, 2018 11:56 AM Windy Kalata wrote: Medication: zolpidem (AMBIEN) 5 MG tablet  Has the patient contacted their pharmacy? No. (Agent: If no, request that the patient contact the pharmacy for the refill.) (Agent: If yes, when and what did the pharmacy advise?)  Preferred Pharmacy (with phone number or street name): [place], [place] [phone] (Phone) [phone] (Fax)    Agent: Please be advised that RX refills may take up to 3 business days. We ask that you follow-up with your pharmacy.

## 2018-11-04 NOTE — Telephone Encounter (Signed)
Pt would like her next refill sent into [facility] for a 90 day supply, she has about a 1.5 weeks lefts of pills now.

## 2018-11-23 ENCOUNTER — Other Ambulatory Visit: Payer: Self-pay | Admitting: Internal Medicine

## 2018-11-23 NOTE — Telephone Encounter (Signed)
Control database checked last refill: 10/26/2018 LOV: 08/23/2018 PZZ:[id number]

## 2018-12-22 ENCOUNTER — Other Ambulatory Visit: Payer: Self-pay | Admitting: Internal Medicine

## 2019-02-11 ENCOUNTER — Other Ambulatory Visit: Payer: Self-pay | Admitting: Internal Medicine

## 2019-02-14 NOTE — Telephone Encounter (Signed)
Control database checked last refill: 01/23/2019 LOV: 08/23/2018 for insomnia  NOV: none

## 2019-03-21 ENCOUNTER — Other Ambulatory Visit: Payer: Self-pay | Admitting: Internal Medicine

## 2019-03-21 NOTE — Telephone Encounter (Signed)
Control database checked last refill: 02/15/2019 LOV:08/23/2018 insomnia  HOO:ILNZ

## 2019-04-21 ENCOUNTER — Emergency Department (HOSPITAL_COMMUNITY): Payer: Medicare Other

## 2019-04-21 ENCOUNTER — Other Ambulatory Visit: Payer: Self-pay

## 2019-04-21 ENCOUNTER — Encounter (HOSPITAL_COMMUNITY): Payer: Self-pay

## 2019-04-21 ENCOUNTER — Observation Stay (HOSPITAL_COMMUNITY)
Admission: EM | Admit: 2019-04-21 | Discharge: 2019-04-25 | Disposition: A | Discharge status: Skilled Nursing Facility | Payer: Medicare Other | Attending: Internal Medicine | Admitting: Internal Medicine

## 2019-04-21 DIAGNOSIS — K219 Gastro-esophageal reflux disease without esophagitis: Secondary | ICD-10-CM | POA: Diagnosis not present

## 2019-04-21 DIAGNOSIS — E785 Hyperlipidemia, unspecified: Secondary | ICD-10-CM | POA: Diagnosis not present

## 2019-04-21 DIAGNOSIS — E43 Unspecified severe protein-calorie malnutrition: Secondary | ICD-10-CM | POA: Diagnosis not present

## 2019-04-21 DIAGNOSIS — M810 Age-related osteoporosis without current pathological fracture: Secondary | ICD-10-CM | POA: Diagnosis not present

## 2019-04-21 DIAGNOSIS — Z87891 Personal history of nicotine dependence: Secondary | ICD-10-CM | POA: Insufficient documentation

## 2019-04-21 DIAGNOSIS — S3282XA Multiple fractures of pelvis without disruption of pelvic ring, initial encounter for closed fracture: Secondary | ICD-10-CM

## 2019-04-21 DIAGNOSIS — I10 Essential (primary) hypertension: Secondary | ICD-10-CM | POA: Diagnosis not present

## 2019-04-21 DIAGNOSIS — M8580 Other specified disorders of bone density and structure, unspecified site: Secondary | ICD-10-CM | POA: Diagnosis not present

## 2019-04-21 DIAGNOSIS — I700 Atherosclerosis of aorta: Secondary | ICD-10-CM | POA: Insufficient documentation

## 2019-04-21 DIAGNOSIS — M1611 Unilateral primary osteoarthritis, right hip: Secondary | ICD-10-CM | POA: Insufficient documentation

## 2019-04-21 DIAGNOSIS — S32591A Other specified fracture of right pubis, initial encounter for closed fracture: Secondary | ICD-10-CM | POA: Diagnosis not present

## 2019-04-21 DIAGNOSIS — F329 Major depressive disorder, single episode, unspecified: Secondary | ICD-10-CM | POA: Insufficient documentation

## 2019-04-21 DIAGNOSIS — I7300 Raynaud's syndrome without gangrene: Secondary | ICD-10-CM | POA: Insufficient documentation

## 2019-04-21 DIAGNOSIS — W010XXA Fall on same level from slipping, tripping and stumbling without subsequent striking against object, initial encounter: Secondary | ICD-10-CM | POA: Diagnosis not present

## 2019-04-21 DIAGNOSIS — E871 Hypo-osmolality and hyponatremia: Secondary | ICD-10-CM

## 2019-04-21 DIAGNOSIS — G4700 Insomnia, unspecified: Secondary | ICD-10-CM | POA: Diagnosis not present

## 2019-04-21 DIAGNOSIS — S32599A Other specified fracture of unspecified pubis, initial encounter for closed fracture: Secondary | ICD-10-CM

## 2019-04-21 DIAGNOSIS — T07XXXA Unspecified multiple injuries, initial encounter: Secondary | ICD-10-CM | POA: Diagnosis present

## 2019-04-21 DIAGNOSIS — Z20828 Contact with and (suspected) exposure to other viral communicable diseases: Secondary | ICD-10-CM | POA: Diagnosis not present

## 2019-04-21 DIAGNOSIS — I341 Nonrheumatic mitral (valve) prolapse: Secondary | ICD-10-CM | POA: Insufficient documentation

## 2019-04-21 DIAGNOSIS — F419 Anxiety disorder, unspecified: Secondary | ICD-10-CM | POA: Diagnosis not present

## 2019-04-21 DIAGNOSIS — Z79899 Other long term (current) drug therapy: Secondary | ICD-10-CM | POA: Insufficient documentation

## 2019-04-21 DIAGNOSIS — R630 Anorexia: Secondary | ICD-10-CM | POA: Insufficient documentation

## 2019-04-21 DIAGNOSIS — J449 Chronic obstructive pulmonary disease, unspecified: Secondary | ICD-10-CM | POA: Insufficient documentation

## 2019-04-21 DIAGNOSIS — Z66 Do not resuscitate: Secondary | ICD-10-CM | POA: Insufficient documentation

## 2019-04-21 LAB — CBC WITH DIFFERENTIAL/PLATELET
Abs Immature Granulocytes: 0.06 10*3/uL (ref 0.00–0.07)
Basophils Absolute: 0 10*3/uL (ref 0.0–0.1)
Basophils Relative: 0 %
Eosinophils Absolute: 0 10*3/uL (ref 0.0–0.5)
Eosinophils Relative: 0 %
HCT: 38 % (ref 36.0–46.0)
Hemoglobin: 12.5 g/dL (ref 12.0–15.0)
Immature Granulocytes: 1 %
Lymphocytes Relative: 14 %
Lymphs Abs: 1.5 10*3/uL (ref 0.7–4.0)
MCH: 29.2 pg (ref 26.0–34.0)
MCHC: 32.9 g/dL (ref 30.0–36.0)
MCV: 88.8 fL (ref 80.0–100.0)
Monocytes Absolute: 0.9 10*3/uL (ref 0.1–1.0)
Monocytes Relative: 8 %
Neutro Abs: 8.2 10*3/uL — ABNORMAL HIGH (ref 1.7–7.7)
Neutrophils Relative %: 77 %
Platelets: 284 10*3/uL (ref 150–400)
RBC: 4.28 MIL/uL (ref 3.87–5.11)
RDW: 12.7 % (ref 11.5–15.5)
WBC: 10.7 10*3/uL — ABNORMAL HIGH (ref 4.0–10.5)
nRBC: 0 % (ref 0.0–0.2)

## 2019-04-21 LAB — BASIC METABOLIC PANEL
Anion gap: 12 (ref 5–15)
BUN: 11 mg/dL (ref 8–23)
CO2: 28 mmol/L (ref 22–32)
Calcium: 9.1 mg/dL (ref 8.9–10.3)
Chloride: 92 mmol/L — ABNORMAL LOW (ref 98–111)
Creatinine, Ser: 0.63 mg/dL (ref 0.44–1.00)
GFR calc Af Amer: >60 mL/min (ref >60)
GFR calc non Af Amer: >60 mL/min (ref >60)
Glucose, Bld: 118 mg/dL — ABNORMAL HIGH (ref 70–99)
Potassium: 4.3 mmol/L (ref 3.5–5.1)
Sodium: 132 mmol/L — ABNORMAL LOW (ref 135–145)

## 2019-04-21 LAB — SARS CORONAVIRUS 2 BY RT PCR (HOSPITAL ORDER, PERFORMED IN ~~LOC~~ HOSPITAL LAB): SARS Coronavirus 2: NEGATIVE

## 2019-04-21 MED ORDER — ZOLPIDEM TARTRATE 5 MG PO TABS
5.0000 mg | ORAL_TABLET | Freq: Every evening | ORAL | Status: DC | PRN
  Administered 2019-04-22 – 2019-04-25 (×4): 5 mg via ORAL
  Filled 2019-04-21 (×4): qty 1

## 2019-04-21 MED ORDER — METOPROLOL TARTRATE 12.5 MG HALF TABLET
12.5000 mg | ORAL_TABLET | Freq: Two times a day (BID) | ORAL | Status: DC
  Administered 2019-04-21 – 2019-04-25 (×8): 12.5 mg via ORAL
  Filled 2019-04-21 (×8): qty 1

## 2019-04-21 MED ORDER — ENOXAPARIN SODIUM 40 MG/0.4ML ~~LOC~~ SOLN
40.0000 mg | Freq: Every day | SUBCUTANEOUS | Status: DC
  Administered 2019-04-21: 40 mg via SUBCUTANEOUS
  Filled 2019-04-21: qty 0.4

## 2019-04-21 MED ORDER — HYDROCODONE-ACETAMINOPHEN 5-325 MG PO TABS
1.0000 | ORAL_TABLET | Freq: Once | ORAL | Status: AC
  Administered 2019-04-21: 1 via ORAL
  Filled 2019-04-21: qty 1

## 2019-04-21 MED ORDER — HYDROCODONE-ACETAMINOPHEN 5-325 MG PO TABS: 0.5000 | ORAL_TABLET | Freq: Four times a day (QID) | ORAL | 0 refills | Status: DC | PRN

## 2019-04-21 MED ORDER — OXYCODONE HCL 5 MG PO TABS
5.0000 mg | ORAL_TABLET | ORAL | Status: DC | PRN
  Administered 2019-04-22: 5 mg via ORAL
  Filled 2019-04-21: qty 1

## 2019-04-21 MED ORDER — SERTRALINE HCL 50 MG PO TABS
50.0000 mg | ORAL_TABLET | Freq: Every day | ORAL | Status: DC
  Administered 2019-04-22 – 2019-04-25 (×4): 50 mg via ORAL
  Filled 2019-04-21 (×4): qty 1

## 2019-04-21 NOTE — Progress Notes (Signed)
CSW received a call from pt's ED TOC CM that an attempt at placing pt has been initiated ad that the PT recommendation is for SNF and requested CSW fax pt out via the hub, per pt's and pt's daughter's agreement to area SNF.  Per ED TOC RN CM pt's preference is for [facility].  Assessment and FL-2 completed by [facility] RN CM.  CSW will continue to follow for D/C needs.  Alphonse Guild. Zaniel Marineau, LCSW, LCAS, CSI Transitions of Care Clinical Social Worker Care Coordination Department Ph: [phone]

## 2019-04-21 NOTE — Progress Notes (Signed)
Per Melbeta MUST pt has a PASRR:  [id number] A Start Date: 02/14/14  CSW will continue to follow for D/C needs.  Alphonse Guild. Yaman Grauberger, LCSW, LCAS, CSI Transitions of Care Clinical Social Worker Care Coordination Department Ph: [phone]

## 2019-04-21 NOTE — Discharge Instructions (Signed)
Return here as needed.  Follow-up with your orthopedist or the one provided °

## 2019-04-21 NOTE — ED Notes (Signed)
Patient ambulated to the restroom with this RN and walker assistance. Patient did not tolerate well. Patient made it halfway to the bathroom with this RN before having to stop every 2 steps, explaining her pain was 10/10 to her right hip, and the pain was causing her waves of nausea. Patient dry heaved a couple of times, but never threw up. Patient made it to the bathroom with significant assistance the 2nd half of the way. Patient urinated and stated her pain decreased after sitting for about 5 minutes. Patient still breathing heavily, despite stating her pain had decreased. Patient brought back to room on [facility]. EDPA Gerald Stabs notified. Patient updated on POC. Patient bp more hypertensive upon returning from bathroom.

## 2019-04-21 NOTE — ED Notes (Signed)
ED TO INPATIENT HANDOFF REPORT  Name/Age/Gender Jenny Torres 85 y.o. female  Code Status Code Status History    Date Active Date Inactive Code Status Order ID Comments User Context   10/23/2017 0234 10/25/2017 1835 Full Code [id number]  Quintella Baton, MD Inpatient   09/14/2016 1818 09/17/2016 1952 DNR [id number]  [name], MD Inpatient   05/08/2015 2042 05/10/2015 1736 DNR [id number]  [name], MD Inpatient   12/02/2014 0813 12/03/2014 2135 Full Code [id number]  [name], MD Inpatient   02/13/2014 0443 02/14/2014 1956 DNR [id number]  [name], NP Inpatient   02/11/2014 2210 02/13/2014 0443 Full Code [id number]  [name], MD Inpatient   02/11/2014 2210 02/11/2014 2210 DNR [id number]  [name], MD Inpatient   Advance Care Planning Activity      Home/SNF/Other Home  Chief Complaint fall  Level of Care/Admitting Diagnosis ED Disposition    ED Disposition Condition Comment   Discharge  The patient appears reasonably screened and/or stabilized for discharge and I doubt any other medical condition or other [facility] requiring further screening, evaluation, or treatment in the ED exists or is present at this time prior to discharge.       Medical History Past Medical History:  Diagnosis Date  . Anorexia   . Anxiety   . Carotid artery occlusion   . COPD (chronic obstructive pulmonary disease) ([place])   . Depression   . Diverticula, colon   . Gastritis   . GERD (gastroesophageal reflux disease)   . Hemorrhoids, external   . HTN (hypertension)   . Hyperlipidemia   . Hyperlipidemia   . Idiopathic peripheral neuropathy    chronic  . MVP (mitral valve prolapse)    stable  . Osteoporosis   . Palpitations   . Polyp of colon   . Raynaud's disease     Allergies Allergies  Allergen Reactions  . Lovastatin Other (See Comments)    unknown  . Sulfamethoxazole Swelling    Swelling of the throat, mouth, tongue, nose  . Darvocet [Propoxyphene  N-Acetaminophen] Itching  . Erythromycin Nausea And Vomiting    Upset stomach  . Hydrocod Polst-Cpm Polst Er Nausea Only    nausea  . Penicillins Nausea And Vomiting    "CAN TOLERATE NOW" Has patient had a PCN reaction causing immediate rash, facial/tongue/throat swelling, SOB or lightheadedness with hypotension: No Has patient had a PCN reaction causing severe rash involving mucus membranes or skin necrosis: No Has patient had a PCN reaction that required hospitalization No Has patient had a PCN reaction occurring within the last 10 years: yes If all of the above answers are "NO", then may proceed with Cephalosporin use.   . Theophyllines Other (See Comments)    Unknown reaction  . Zovirax [Acyclovir] Other (See Comments)    Per patient lots of side effects  . Ceftin Diarrhea    GI problems  . Paroxetine Hcl Other (See Comments)    jittery    IV Location/Drains/Wounds Patient Lines/Drains/Airways Status   Active Line/Drains/Airways    Name:   Placement date:   Placement time:   Site:   Days:   Peripheral IV 04/21/19 Left Forearm   04/21/19    1716    Forearm   less than 1   External Urinary Catheter   04/21/19    1606    -   less than 1   Pressure Injury 09/14/16 Stage I -  Intact skin with non-blanchable redness of a localized  area usually over a bony prominence.   09/14/16    1832     949          Labs/Imaging Results for orders placed or performed during the hospital encounter of 04/21/19 (from the past 48 hour(s))  Basic metabolic panel     Status: Abnormal   Collection Time: 04/21/19  5:11 PM  Result Value Ref Range   Sodium 132 (L) 135 - 145 mmol/L   Potassium 4.3 3.5 - 5.1 mmol/L   Chloride 92 (L) 98 - 111 mmol/L   CO2 28 22 - 32 mmol/L   Glucose, Bld 118 (H) 70 - 99 mg/dL   BUN 11 8 - 23 mg/dL   Creatinine, Ser 0.63 0.44 - 1.00 mg/dL   Calcium 9.1 8.9 - 10.3 mg/dL   GFR calc non Af Amer >60 >60 mL/min   GFR calc Af Amer >60 >60 mL/min   Anion gap 12 5 - 15     Comment: Performed at [facility], [place] [address], [place], [place] [address]  CBC with Differential     Status: Abnormal   Collection Time: 04/21/19  5:11 PM  Result Value Ref Range   WBC 10.7 (H) 4.0 - 10.5 K/uL   RBC 4.28 3.87 - 5.11 MIL/uL   Hemoglobin 12.5 12.0 - 15.0 g/dL   HCT 38.0 36.0 - 46.0 %   MCV 88.8 80.0 - 100.0 fL   MCH 29.2 26.0 - 34.0 pg   MCHC 32.9 30.0 - 36.0 g/dL   RDW 12.7 11.5 - 15.5 %   Platelets 284 150 - 400 K/uL   nRBC 0.0 0.0 - 0.2 %   Neutrophils Relative % 77 %   Neutro Abs 8.2 (H) 1.7 - 7.7 K/uL   Lymphocytes Relative 14 %   Lymphs Abs 1.5 0.7 - 4.0 K/uL   Monocytes Relative 8 %   Monocytes Absolute 0.9 0.1 - 1.0 K/uL   Eosinophils Relative 0 %   Eosinophils Absolute 0.0 0.0 - 0.5 K/uL   Basophils Relative 0 %   Basophils Absolute 0.0 0.0 - 0.1 K/uL   Immature Granulocytes 1 %   Abs Immature Granulocytes 0.06 0.00 - 0.07 K/uL    Comment: Performed at [facility], [place] [address] [place], [place] [address]   Ct Hip Right Wo Contrast  Result Date: 04/21/2019 CLINICAL DATA:  Right hip pain since a trip and fall at 1 a.m. last night. Initial encounter. EXAM: CT OF THE RIGHT HIP WITHOUT CONTRAST TECHNIQUE: Multidetector CT imaging of the right hip was performed according to the standard protocol. Multiplanar CT image reconstructions were also generated. COMPARISON:  Plain films right hip earlier today. FINDINGS: Bones/Joint/Cartilage Nondisplaced fracture of the posterior, inferior aspect of the left ilium is incomplete. The patient also has a nondisplaced fracture of the high right superior pubic ramus. Finally, there is an acute on chronic fracture of the anterior aspect of the right inferior pubic ramus. Remote healed fracture of the posterior aspect of the inferior pubic ramus is noted. The right hip is located and no acute hip fracture is identified. Healed intertrochanteric fracture with fixation hardware in  place noted. Advanced right hip osteoarthritis is present. Ligaments Suboptimally assessed by CT. Muscles and Tendons Intact. Soft tissues No acute abnormality within the pelvis is identified. Status post hysterectomy. Atherosclerosis noted. IMPRESSION: Acute, nondisplaced high right superior pubic ramus fracture. Also seen is an acute on chronic right inferior pubic ramus fracture. Nondisplaced fracture of  the posterior, inferior aspect of the right ilium. Negative for acute hip fracture. Healed intertrochanteric fracture noted. Advanced right hip osteoarthritis. Electronically Signed   By: Inge Rise M.D.   On: 04/21/2019 13:19   Dg Chest Port 1 View  Result Date: 04/21/2019 CLINICAL DATA:  Fall.  Pelvic pain. EXAM: PORTABLE CHEST 1 VIEW COMPARISON:  10/22/2017 FINDINGS: The cardiomediastinal silhouette is within normal limits. Aortic atherosclerosis is noted. The lungs are hyperinflated with similar appearance of biapical scarring. No airspace consolidation, edema, pleural effusion, pneumothorax is identified. No acute osseous abnormality is seen. IMPRESSION: No active disease. Electronically Signed   By: Logan Bores M.D.   On: 04/21/2019 16:05   Dg Hip Unilat  With Pelvis 2-3 Views Right  Result Date: 04/21/2019 CLINICAL DATA:  Fall. EXAM: DG HIP (WITH OR WITHOUT PELVIS) 2-3V RIGHT COMPARISON:  05/31/2015. FINDINGS: Prior ORIF right femur. Hardware intact. Anatomic alignment. Diffuse osteopenia and degenerative change. Old right superior and r inferior pubic rami fractures. No acute fractures identified. IMPRESSION: 1. Prior ORIF right femur. Anatomic alignment. Old right superior inferior pubic rami fractures noted. No acute abnormality identified. No evidence of acute fracture or dislocation. 2.  Diffuse osteopenia degenerative change. Electronically Signed   By: Marcello Moores  Register   On: 04/21/2019 09:24    Pending Labs Unresulted Labs (From admission, onward)    Start     Ordered   04/21/19  1711  SARS Coronavirus 2 [facility] order, Performed in [facility] hospital lab)  Once,   R     04/21/19 1711          Vitals/Pain Today's Vitals   04/21/19 1230 04/21/19 1420 04/21/19 1600 04/21/19 1630  BP: 137/84 121/89 132/82 (!) 169/79  Pulse:  94 78 81  Resp: 13 16 (!) 30 (!) 23  Temp:      TempSrc:      SpO2:  99% 95% 94%  Weight:      PainSc:  5       Isolation Precautions No active isolations  Medications Medications  HYDROcodone-acetaminophen (NORCO/VICODIN) 5-325 MG per tablet 1 tablet (1 tablet Oral Given 04/21/19 1423)    Mobility non-ambulatory. Patient usually is ambulatory with walker, but pelvis fracture prevents this at this time. Patient able to stand and pivot for bedside commode with standby assistance and walker.

## 2019-04-21 NOTE — Evaluation (Addendum)
Physical Therapy Evaluation Patient Details Name: Jenny Torres MRN: [id number] DOB: [birth year] Today's Date: 04/21/2019   History of Present Illness  Pt s/p post fall with noted multiple non-displaced pelvic fxs on imaging.  Pt with hx of falls, R hip fx, peripheral neuropathy, anorexia, anxiety and osteoporosis   Clinical Impression  Pt admitted as above and presenting with functional mobility limitations 2* generalized weakness/deconditioning, pain associated with multiple non-displaced pelvic fxs, and significant balance deficits with standing, transferring and ambulating.  Pt is at very high risk of falls if returns to previous living arrangement at this time and would benefit from follow up rehab at SNF level to maximize IND and safety.    Follow Up Recommendations SNF    Equipment Recommendations  None recommended by PT    Recommendations for Other Services       Precautions / Restrictions Precautions Precautions: Fall Restrictions Weight Bearing Restrictions: No      Mobility  Bed Mobility Overal bed mobility: Needs Assistance Bed Mobility: Supine to Sit;Sit to Supine     Supine to sit: Min assist;Mod assist Sit to supine: Min assist   General bed mobility comments: increased time 2* pain.  Physical assist to manage LEs and bring trunk to upright to sit at EOB and also to manage LEs back into bed  Transfers Overall transfer level: Needs assistance Equipment used: Rolling walker (2 wheeled) Transfers: Sit to/from Stand Sit to Stand: Min assist;Mod assist Stand pivot transfers: Min assist       General transfer comment: cues for use of UEs to self assist and physical assist to bring wt up and fwd and to balance in initial standing.  Assist for balance and support to stand pvt bed to [facility] using RW  Ambulation/Gait Ambulation/Gait assistance: Min assist Gait Distance (Feet): 15 Feet Assistive device: Rolling walker (2 wheeled) Gait Pattern/deviations: Step-to  pattern;Step-through pattern;Decreased step length - right;Decreased step length - left;Shuffle;Trunk flexed;Narrow base of support Gait velocity: decr   General Gait Details: Increased time with cues for posture and position from RW.  Physical assist for balance, support and RW management  Stairs            Wheelchair Mobility    Modified Rankin (Stroke Patients Only)       Balance Overall balance assessment: Needs assistance Sitting-balance support: Feet supported;No upper extremity supported Sitting balance-Leahy Scale: Fair     Standing balance support: Bilateral upper extremity supported Standing balance-Leahy Scale: Poor                               Pertinent Vitals/Pain Pain Assessment: Faces Faces Pain Scale: Hurts even more Pain Location: R hip Pain Descriptors / Indicators: Grimacing;Guarding;Sore Pain Intervention(s): Limited activity within patient's tolerance;Monitored during session    Home Living Family/patient expects to be discharged to:: Private residence(Independent living at [facility]) Living Arrangements: Alone   Type of Home: Independent living facility Home Access: [place]: One level Home Equipment: Environmental consultant - 2 wheels      Prior Function Level of Independence: Independent with assistive device(s)         Comments: RW at all times     Hand Dominance        Extremity/Trunk Assessment   Upper Extremity Assessment Upper Extremity Assessment: Generalized weakness    Lower Extremity Assessment Lower Extremity Assessment: Generalized weakness;RLE deficits/detail;LLE deficits/detail RLE Sensation: history of peripheral neuropathy LLE  Sensation: history of peripheral neuropathy    Cervical / Trunk Assessment Cervical / Trunk Assessment: Kyphotic  Communication   Communication: No difficulties  Cognition Arousal/Alertness: Awake/alert Behavior During Therapy: WFL for tasks  assessed/performed Overall Cognitive Status: Within Functional Limits for tasks assessed                                        General Comments      Exercises     Assessment/Plan    PT Assessment Patient needs continued PT services  PT Problem List Decreased strength;Decreased activity tolerance;Decreased balance;Decreased mobility;Decreased knowledge of use of DME;Pain       PT Treatment Interventions DME instruction;Gait training;Functional mobility training;Therapeutic activities;Therapeutic exercise;Balance training;Patient/family education    PT Goals (Current goals can be found in the Care Plan section)  Acute Rehab PT Goals Patient Stated Goal: Regain IND PT Goal Formulation: With patient Time For Goal Achievement: 05/05/19 Potential to Achieve Goals: Fair    Frequency Min 3X/week   Barriers to discharge Decreased caregiver support Pt is in IND living and states she has very little assistance    Co-evaluation               AM-PAC PT "6 Clicks" Mobility  Outcome Measure Help needed turning from your back to your side while in a flat bed without using bedrails?: A Lot Help needed moving from lying on your back to sitting on the side of a flat bed without using bedrails?: A Lot Help needed moving to and from a bed to a chair (including a wheelchair)?: A Lot Help needed standing up from a chair using your arms (e.g., wheelchair or bedside chair)?: A Lot Help needed to walk in hospital room?: A Little Help needed climbing 3-5 steps with a railing? : A Lot 6 Click Score: 13    End of Session Equipment Utilized During Treatment: Gait belt Activity Tolerance: Patient limited by fatigue;Patient limited by pain Patient left: in bed;with call bell/phone within reach;with nursing/sitter in room Nurse Communication: Mobility status PT Visit Diagnosis: Difficulty in walking, not elsewhere classified (R26.2);Muscle weakness (generalized)  (M62.81);History of falling (Z91.81);Pain Pain - Right/Left: Right Pain - part of body: Hip    Time: 1645-1710 PT Time Calculation (min) (ACUTE ONLY): 25 min   Charges:   PT Evaluation $PT Eval Low Complexity: 1 Low PT Treatments $Gait Training: 8-22 mins        Debe Coder PT Acute Rehabilitation Services Pager [phone] Office [phone]   Ravinder Lukehart 04/21/2019, 5:46 PM

## 2019-04-21 NOTE — TOC Initial Note (Addendum)
Transition of Care [facility]) - Initial/Assessment Note    Patient Details  Name: Jenny Torres MRN: [id number] Date of Birth: August 09, 1928  Transition of Care [facility]) CM/SW Contact:    Erenest Rasher, RN Phone Number: [phone] 04/21/2019, 5:12 PM  Clinical Narrative:                 Spoke to pt and states she [place] lives alone in [place] apt at [facility]. Pt is agreeable to SNF rehab. States she was at [facility] in the past. Gave consent to create FL2 and fax to SNF for placement. Gave permission to speak to dtr, Jenny Torres. Dtr states that pt will not have 24 hour caregiver in the home to assist. She is requesting SNF rehab. Waiting PT recommendation.   Expected Discharge Plan: Skilled Nursing Facility Barriers to Discharge: Continued Medical Work up   Patient Goals and CMS Choice Patient states their goals for this hospitalization and ongoing recovery are:: want to be able to walk and move CMS Medicare.gov Compare Post Acute Care list provided to:: Patient Represenative (must comment)(Jenny Torres) Choice offered to / list presented to : Patient, Adult Children  Expected Discharge Plan and Services Expected Discharge Plan: [place] In-house Referral: Clinical Social Work Discharge Planning Services: CM Consult   Living arrangements for the past 2 months: [place]                                      Prior Living Arrangements/Services Living arrangements for the past 2 months: [place] Lives with:: Self Patient language and need for interpreter reviewed:: Yes Do you feel safe going back to the place where you live?: Yes      Need for Family Participation in Patient Care: Yes (Comment) Care giver support system in place?: Yes (comment) Current home services: DME([facility]) Criminal Activity/Legal Involvement Pertinent to Current Situation/Hospitalization: No - Comment as needed  Activities  of Daily Living      Permission Sought/Granted Permission sought to share information with : Case Manager Permission granted to share information with : Yes, Verbal Permission Granted  Share Information with NAME: Jenny Torres  Permission granted to share info w AGENCY: [employer] granted to share info w Relationship: daughter  Permission granted to share info w Contact Information: [phone]  Emotional Assessment Appearance:: Appears stated age Attitude/Demeanor/Rapport: Gracious Affect (typically observed): Accepting Orientation: : Oriented to Self, Oriented to Place, Oriented to  Time, Oriented to Situation   Psych Involvement: No (comment)  Admission diagnosis:  fall Patient Active Problem List   Diagnosis Date Noted  . Routine general medical examination at a health care facility 04/12/2018  . Venous insufficiency 12/17/2017  . Physical deconditioning   . Dysuria 09/08/2016  . Syncope and collapse 12/02/2014  . Hyperlipidemia 12/02/2014  . COPD (chronic obstructive pulmonary disease) ([place]) 12/02/2014  . Essential hypertension 12/02/2014  . Insomnia 02/18/2014  . Protein-calorie malnutrition, severe ([place]) 02/12/2014  . Low vitamin B12 level 12/29/2012  . Pulmonary nodule 10/21/2012  . Idiopathic peripheral neuropathy   . MVP (mitral valve prolapse)   . Osteoporosis   . Raynaud's disease 04/24/2011  . Anxiety state 11/10/2007  . Depression 11/10/2007   PCP:  Hoyt Koch, MD Pharmacy:   [place], [place]  [address] Phone: [phone] Fax: [phone]  [facility] [place], [place] - [address] [place] [address] Phone: [phone] Fax: [phone]     Social Determinants of Health (SDOH) Interventions    Readmission Risk Interventions No flowsheet data found.

## 2019-04-21 NOTE — ED Notes (Signed)
Patient provided with dinner tray.

## 2019-04-21 NOTE — ED Notes (Signed)
ED TO INPATIENT HANDOFF REPORT  Name/Age/Gender Jenny Torres 85 y.o. female  Code Status Code Status History    Date Active Date Inactive Code Status Order ID Comments User Context   10/23/2017 0234 10/25/2017 1835 Full Code [id number]  Quintella Baton, MD Inpatient   09/14/2016 1818 09/17/2016 1952 DNR [id number]  [name], MD Inpatient   05/08/2015 2042 05/10/2015 1736 DNR [id number]  [name], MD Inpatient   12/02/2014 0813 12/03/2014 2135 Full Code [id number]  [name], MD Inpatient   02/13/2014 0443 02/14/2014 1956 DNR [id number]  [name], NP Inpatient   02/11/2014 2210 02/13/2014 0443 Full Code [id number]  [name], MD Inpatient   02/11/2014 2210 02/11/2014 2210 DNR [id number]  [name], MD Inpatient   Advance Care Planning Activity      Home/SNF/Other Nursing Home  Chief Complaint fall  Level of Care/Admitting Diagnosis ED Disposition    ED Disposition Condition [place]: [place] [[id number]]  Level of Care: Med-Surg [16]  Covid Evaluation: Confirmed COVID Negative  Diagnosis: Pubic ramus fracture [facility]) [[id number]]  Admitting Physician: Eston Esters  Attending Physician: Eston Esters  Estimated length of stay: past midnight tomorrow  Certification:: I certify this patient will need inpatient services for at least 2 midnights  PT Class (Do Not Modify): Inpatient [101]  PT Acc Code (Do Not Modify): Private [1]       Medical History Past Medical History:  Diagnosis Date  . Anorexia   . Anxiety   . Carotid artery occlusion   . COPD (chronic obstructive pulmonary disease) ([place])   . Depression   . Diverticula, colon   . Gastritis   . GERD (gastroesophageal reflux disease)   . Hemorrhoids, external   . HTN (hypertension)   . Hyperlipidemia   . Hyperlipidemia   . Idiopathic peripheral neuropathy    chronic  . MVP (mitral valve prolapse)    stable  .  Osteoporosis   . Palpitations   . Polyp of colon   . Raynaud's disease     Allergies Allergies  Allergen Reactions  . Lovastatin Other (See Comments)    unknown  . Sulfamethoxazole Swelling    Swelling of the throat, mouth, tongue, nose  . Darvocet [Propoxyphene N-Acetaminophen] Itching  . Erythromycin Nausea And Vomiting    Upset stomach  . Hydrocod Polst-Cpm Polst Er Nausea Only    nausea  . Penicillins Nausea And Vomiting    "CAN TOLERATE NOW" Has patient had a PCN reaction causing immediate rash, facial/tongue/throat swelling, SOB or lightheadedness with hypotension: No Has patient had a PCN reaction causing severe rash involving mucus membranes or skin necrosis: No Has patient had a PCN reaction that required hospitalization No Has patient had a PCN reaction occurring within the last 10 years: yes If all of the above answers are "NO", then may proceed with Cephalosporin use.   . Theophyllines Other (See Comments)    Unknown reaction  . Zovirax [Acyclovir] Other (See Comments)    Per patient lots of side effects  . Ceftin Diarrhea    GI problems  . Paroxetine Hcl Other (See Comments)    jittery    IV Location/Drains/Wounds Patient Lines/Drains/Airways Status   Active Line/Drains/Airways    Name:   Placement date:   Placement time:   Site:   Days:   Peripheral IV 04/21/19 Left Forearm   04/21/19    1716    Forearm  less than 1   External Urinary Catheter   04/21/19    1606    -   less than 1   Pressure Injury 09/14/16 Stage I -  Intact skin with non-blanchable redness of a localized area usually over a bony prominence.   09/14/16    1832     949          Labs/Imaging Results for orders placed or performed during the hospital encounter of 04/21/19 (from the past 48 hour(s))  Basic metabolic panel     Status: Abnormal   Collection Time: 04/21/19  5:11 PM  Result Value Ref Range   Sodium 132 (L) 135 - 145 mmol/L   Potassium 4.3 3.5 - 5.1 mmol/L   Chloride 92  (L) 98 - 111 mmol/L   CO2 28 22 - 32 mmol/L   Glucose, Bld 118 (H) 70 - 99 mg/dL   BUN 11 8 - 23 mg/dL   Creatinine, Ser 0.63 0.44 - 1.00 mg/dL   Calcium 9.1 8.9 - 10.3 mg/dL   GFR calc non Af Amer >60 >60 mL/min   GFR calc Af Amer >60 >60 mL/min   Anion gap 12 5 - 15    Comment: Performed at [facility], [place] [address], [place], [place] [address]  CBC with Differential     Status: Abnormal   Collection Time: 04/21/19  5:11 PM  Result Value Ref Range   WBC 10.7 (H) 4.0 - 10.5 K/uL   RBC 4.28 3.87 - 5.11 MIL/uL   Hemoglobin 12.5 12.0 - 15.0 g/dL   HCT 38.0 36.0 - 46.0 %   MCV 88.8 80.0 - 100.0 fL   MCH 29.2 26.0 - 34.0 pg   MCHC 32.9 30.0 - 36.0 g/dL   RDW 12.7 11.5 - 15.5 %   Platelets 284 150 - 400 K/uL   nRBC 0.0 0.0 - 0.2 %   Neutrophils Relative % 77 %   Neutro Abs 8.2 (H) 1.7 - 7.7 K/uL   Lymphocytes Relative 14 %   Lymphs Abs 1.5 0.7 - 4.0 K/uL   Monocytes Relative 8 %   Monocytes Absolute 0.9 0.1 - 1.0 K/uL   Eosinophils Relative 0 %   Eosinophils Absolute 0.0 0.0 - 0.5 K/uL   Basophils Relative 0 %   Basophils Absolute 0.0 0.0 - 0.1 K/uL   Immature Granulocytes 1 %   Abs Immature Granulocytes 0.06 0.00 - 0.07 K/uL    Comment: Performed at [facility], [place] [address] [place], [place] [address]  SARS Coronavirus 2 [facility] order, Performed in [facility] hospital lab)     Status: None   Collection Time: 04/21/19  5:11 PM  Result Value Ref Range   SARS Coronavirus 2 NEGATIVE NEGATIVE    Comment: (NOTE) If result is NEGATIVE SARS-CoV-2 target nucleic acids are NOT DETECTED. The SARS-CoV-2 RNA is generally detectable in upper and lower  respiratory specimens during the acute phase of infection. The lowest  concentration of SARS-CoV-2 viral copies this assay can detect is 250  copies / mL. A negative result does not preclude SARS-CoV-2 infection  and should not be used as the sole basis for treatment or other  patient  management decisions.  A negative result may occur with  improper specimen collection / handling, submission of specimen other  than nasopharyngeal swab, presence of viral mutation(s) within the  areas targeted by this assay, and inadequate number of viral copies  (<250 copies / mL). A  negative result must be combined with clinical  observations, patient history, and epidemiological information. If result is POSITIVE SARS-CoV-2 target nucleic acids are DETECTED. The SARS-CoV-2 RNA is generally detectable in upper and lower  respiratory specimens dur ing the acute phase of infection.  Positive  results are indicative of active infection with SARS-CoV-2.  Clinical  correlation with patient history and other diagnostic information is  necessary to determine patient infection status.  Positive results do  not rule out bacterial infection or co-infection with other viruses. If result is PRESUMPTIVE POSTIVE SARS-CoV-2 nucleic acids MAY BE PRESENT.   A presumptive positive result was obtained on the submitted specimen  and confirmed on repeat testing.  While 2019 novel coronavirus  (SARS-CoV-2) nucleic acids may be present in the submitted sample  additional confirmatory testing may be necessary for epidemiological  and / or clinical management purposes  to differentiate between  SARS-CoV-2 and other Sarbecovirus currently known to infect humans.  If clinically indicated additional testing with an alternate test  methodology [phone]) is advised. The SARS-CoV-2 RNA is generally  detectable in upper and lower respiratory sp ecimens during the acute  phase of infection. The expected result is Negative. Fact Sheet for Patients:  StrictlyIdeas.no Fact Sheet for Healthcare Providers: BankingDealers.co.za This test is not yet approved or cleared by the [place] FDA and has been authorized for detection and/or diagnosis of SARS-CoV-2 by FDA under  an Emergency Use Authorization (EUA).  This EUA will remain in effect (meaning this test can be used) for the duration of the COVID-19 declaration under Section 564(b)(1) of the Act, 21 U.S.C. section 360bbb-3(b)(1), unless the authorization is terminated or revoked sooner. Performed at [facility], [place] [address], [place], [place] [address]    Ct Hip Right Wo Contrast  Result Date: 04/21/2019 CLINICAL DATA:  Right hip pain since a trip and fall at 1 a.m. last night. Initial encounter. EXAM: CT OF THE RIGHT HIP WITHOUT CONTRAST TECHNIQUE: Multidetector CT imaging of the right hip was performed according to the standard protocol. Multiplanar CT image reconstructions were also generated. COMPARISON:  Plain films right hip earlier today. FINDINGS: Bones/Joint/Cartilage Nondisplaced fracture of the posterior, inferior aspect of the left ilium is incomplete. The patient also has a nondisplaced fracture of the high right superior pubic ramus. Finally, there is an acute on chronic fracture of the anterior aspect of the right inferior pubic ramus. Remote healed fracture of the posterior aspect of the inferior pubic ramus is noted. The right hip is located and no acute hip fracture is identified. Healed intertrochanteric fracture with fixation hardware in place noted. Advanced right hip osteoarthritis is present. Ligaments Suboptimally assessed by CT. Muscles and Tendons Intact. Soft tissues No acute abnormality within the pelvis is identified. Status post hysterectomy. Atherosclerosis noted. IMPRESSION: Acute, nondisplaced high right superior pubic ramus fracture. Also seen is an acute on chronic right inferior pubic ramus fracture. Nondisplaced fracture of the posterior, inferior aspect of the right ilium. Negative for acute hip fracture. Healed intertrochanteric fracture noted. Advanced right hip osteoarthritis. Electronically Signed   By: Inge Rise M.D.   On: 04/21/2019 13:19   Dg  Chest Port 1 View  Result Date: 04/21/2019 CLINICAL DATA:  Fall.  Pelvic pain. EXAM: PORTABLE CHEST 1 VIEW COMPARISON:  10/22/2017 FINDINGS: The cardiomediastinal silhouette is within normal limits. Aortic atherosclerosis is noted. The lungs are hyperinflated with similar appearance of biapical scarring. No airspace consolidation, edema, pleural effusion, pneumothorax is identified. No acute osseous  abnormality is seen. IMPRESSION: No active disease. Electronically Signed   By: Logan Bores M.D.   On: 04/21/2019 16:05   Dg Hip Unilat  With Pelvis 2-3 Views Right  Result Date: 04/21/2019 CLINICAL DATA:  Fall. EXAM: DG HIP (WITH OR WITHOUT PELVIS) 2-3V RIGHT COMPARISON:  05/31/2015. FINDINGS: Prior ORIF right femur. Hardware intact. Anatomic alignment. Diffuse osteopenia and degenerative change. Old right superior and r inferior pubic rami fractures. No acute fractures identified. IMPRESSION: 1. Prior ORIF right femur. Anatomic alignment. Old right superior inferior pubic rami fractures noted. No acute abnormality identified. No evidence of acute fracture or dislocation. 2.  Diffuse osteopenia degenerative change. Electronically Signed   By: Marcello [place]  Register   On: 04/21/2019 09:24    Pending Labs Unresulted Labs (From admission, onward)    Start     Ordered   Signed and Held  CBC  (enoxaparin (LOVENOX)    CrCl >/= 30 ml/min)  Once,   R    Comments: Baseline for enoxaparin therapy IF NOT ALREADY DRAWN.  Notify MD if PLT < 100 K.    Signed and Held   Signed and Held  Creatinine, serum  (enoxaparin (LOVENOX)    CrCl >/= 30 ml/min)  Once,   R    Comments: Baseline for enoxaparin therapy IF NOT ALREADY DRAWN.    Signed and Held   Signed and Held  Creatinine, serum  (enoxaparin (LOVENOX)    CrCl >/= 30 ml/min)  Weekly,   R    Comments: while on enoxaparin therapy    Signed and Held          Vitals/Pain Today's Vitals   04/21/19 1420 04/21/19 1600 04/21/19 1630 04/21/19 1845  BP: 121/89 132/82  (!) 169/79 (!) 141/94  Pulse: 94 78 81 86  Resp: 16 (!) 30 (!) 23 (!) 21  Temp:    98.9 F (37.2 C)  TempSrc:    Oral  SpO2: 99% 95% 94% 97%  Weight:      PainSc: 5    0-No pain    Isolation Precautions No active isolations  Medications Medications  zolpidem (AMBIEN) tablet 5 mg (has no administration in time range)  HYDROcodone-acetaminophen (NORCO/VICODIN) 5-325 MG per tablet 1 tablet (1 tablet Oral Given 04/21/19 1423)    Mobility walks with device

## 2019-04-21 NOTE — ED Notes (Signed)
PT at bedside. Patient up to bedside commode with walker and PT assistance.

## 2019-04-21 NOTE — ED Provider Notes (Signed)
[facility] DEPT Provider Note   CSN: [id number] Arrival date & time: 04/21/19  0800     History   Chief Complaint Chief Complaint  Patient presents with  . Fall    HPI Jenny Torres is a 85 y.o. female.     HPI Patient presents to the emergency department with injuries following a fall.  Patient states she fell around 1 AM and was unable to get up.  She states that she is having pain in the right hip region.  Patient states that she was able to call the nursing staff this morning.  Patient states she is a walker to ambulate.  Patient states she has no other injuries and did not hit her head.  The patient denies chest pain, shortness of breath, headache,blurred vision, neck pain, fever, cough, weakness, numbness, dizziness, anorexia, edema, abdominal pain, nausea, vomiting, diarrhea, rash, back pain, dysuria, hematemesis, bloody stool, near syncope, or syncope. Past Medical History:  Diagnosis Date  . Anorexia   . Anxiety   . Carotid artery occlusion   . COPD (chronic obstructive pulmonary disease) ([place])   . Depression   . Diverticula, colon   . Gastritis   . GERD (gastroesophageal reflux disease)   . Hemorrhoids, external   . HTN (hypertension)   . Hyperlipidemia   . Hyperlipidemia   . Idiopathic peripheral neuropathy    chronic  . MVP (mitral valve prolapse)    stable  . Osteoporosis   . Palpitations   . Polyp of colon   . Raynaud's disease     Patient Active Problem List   Diagnosis Date Noted  . Routine general medical examination at a health care facility 04/12/2018  . Venous insufficiency 12/17/2017  . Physical deconditioning   . Dysuria 09/08/2016  . Syncope and collapse 12/02/2014  . Hyperlipidemia 12/02/2014  . COPD (chronic obstructive pulmonary disease) ([place]) 12/02/2014  . Essential hypertension 12/02/2014  . Insomnia 02/18/2014  . Protein-calorie malnutrition, severe ([place]) 02/12/2014  . Low vitamin B12 level  12/29/2012  . Pulmonary nodule 10/21/2012  . Idiopathic peripheral neuropathy   . MVP (mitral valve prolapse)   . Osteoporosis   . Raynaud's disease 04/24/2011  . Anxiety state 11/10/2007  . Depression 11/10/2007    Past Surgical History:  Procedure Laterality Date  . CESAREAN SECTION     x2  . CHOLECYSTECTOMY    . INTRAMEDULLARY (IM) NAIL INTERTROCHANTERIC Right 02/11/2014     OB History   No obstetric history on file.      Home Medications    Prior to Admission medications   Medication Sig Start Date End Date Taking? Authorizing Provider  metoprolol tartrate (LOPRESSOR) 25 MG tablet Take 0.5 tablets (12.5 mg total) by mouth 2 (two) times daily. 01/24/18   Hoyt Koch, MD  ondansetron (ZOFRAN ODT) 4 MG disintegrating tablet Take 1 tablet (4 mg total) by mouth every 4 (four) hours as needed for nausea or vomiting. 04/12/18   Hoyt Koch, MD  sertraline (ZOLOFT) 50 MG tablet TAKE 1 TABLET DAILY 12/23/18   Hoyt Koch, MD  zolpidem (AMBIEN) 5 MG tablet TAKE ONE TABLET AT BEDTIME AS NEEDED FOR SLEEP 03/21/19   Hoyt Koch, MD    Family History Family History  Problem Relation Age of Onset  . Breast cancer Mother   . Liver disease Brother   . Cirrhosis Brother     Social History Social History   Tobacco Use  . Smoking status: Former  Smoker    Packs/day: 1.00    Years: 25.00    Pack years: 25.00    Types: Cigarettes    Quit date: 09/22/1983    Years since quitting: 35.6  . Smokeless tobacco: Never Used  Substance Use Topics  . Alcohol use: Yes    Comment: rarely  . Drug use: No     Allergies   Lovastatin, Sulfamethoxazole, Darvocet [propoxyphene n-acetaminophen], Erythromycin, Hydrocod polst-cpm polst er, Penicillins, Theophyllines, Zovirax [acyclovir], Ceftin, and Paroxetine hcl   Review of Systems Review of Systems All other systems negative except as documented in the HPI. All pertinent positives and negatives as reviewed  in the HPI.  Physical Exam Updated Vital Signs BP 121/89 (BP Location: Right Arm)   Pulse 94   Temp 97.8 F (36.6 C) (Oral)   Resp 16   Wt 34 kg   SpO2 99%   BMI 14.64 kg/m   Physical Exam Vitals signs and nursing note reviewed.  Constitutional:      General: She is not in acute distress.    Appearance: She is well-developed.  HENT:     Head: Normocephalic and atraumatic.  Eyes:     Pupils: Pupils are equal, round, and reactive to light.  Neck:     Musculoskeletal: Normal range of motion and neck supple.  Cardiovascular:     Rate and Rhythm: Normal rate and regular rhythm.     Heart sounds: Normal heart sounds. No murmur. No friction rub. No gallop.   Pulmonary:     Effort: Pulmonary effort is normal. No respiratory distress.     Breath sounds: Normal breath sounds. No wheezing.  Abdominal:     Palpations: Abdomen is soft.  Skin:    General: Skin is warm and dry.     Capillary Refill: Capillary refill takes less than 2 seconds.     Findings: No erythema or rash.  Neurological:     Mental Status: She is alert and oriented to person, place, and time.     Motor: No abnormal muscle tone.     Coordination: Coordination normal.  Psychiatric:        Behavior: Behavior normal.      ED Treatments / Results  Labs (all labs ordered are listed, but only abnormal results are displayed) Labs Reviewed - No data to display  EKG None  Radiology Ct Hip Right Wo Contrast  Result Date: 04/21/2019 CLINICAL DATA:  Right hip pain since a trip and fall at 1 a.m. last night. Initial encounter. EXAM: CT OF THE RIGHT HIP WITHOUT CONTRAST TECHNIQUE: Multidetector CT imaging of the right hip was performed according to the standard protocol. Multiplanar CT image reconstructions were also generated. COMPARISON:  Plain films right hip earlier today. FINDINGS: Bones/Joint/Cartilage Nondisplaced fracture of the posterior, inferior aspect of the left ilium is incomplete. The patient also has a  nondisplaced fracture of the high right superior pubic ramus. Finally, there is an acute on chronic fracture of the anterior aspect of the right inferior pubic ramus. Remote healed fracture of the posterior aspect of the inferior pubic ramus is noted. The right hip is located and no acute hip fracture is identified. Healed intertrochanteric fracture with fixation hardware in place noted. Advanced right hip osteoarthritis is present. Ligaments Suboptimally assessed by CT. Muscles and Tendons Intact. Soft tissues No acute abnormality within the pelvis is identified. Status post hysterectomy. Atherosclerosis noted. IMPRESSION: Acute, nondisplaced high right superior pubic ramus fracture. Also seen is an acute on chronic right  inferior pubic ramus fracture. Nondisplaced fracture of the posterior, inferior aspect of the right ilium. Negative for acute hip fracture. Healed intertrochanteric fracture noted. Advanced right hip osteoarthritis. Electronically Signed   By: Inge Rise M.D.   On: 04/21/2019 13:19   Dg Hip Unilat  With Pelvis 2-3 Views Right  Result Date: 04/21/2019 CLINICAL DATA:  Fall. EXAM: DG HIP (WITH OR WITHOUT PELVIS) 2-3V RIGHT COMPARISON:  05/31/2015. FINDINGS: Prior ORIF right femur. Hardware intact. Anatomic alignment. Diffuse osteopenia and degenerative change. Old right superior and r inferior pubic rami fractures. No acute fractures identified. IMPRESSION: 1. Prior ORIF right femur. Anatomic alignment. Old right superior inferior pubic rami fractures noted. No acute abnormality identified. No evidence of acute fracture or dislocation. 2.  Diffuse osteopenia degenerative change. Electronically Signed   By: Marcello Moores  Register   On: 04/21/2019 09:24    Procedures Procedures (including critical care time)  Medications Ordered in ED Medications  HYDROcodone-acetaminophen (NORCO/VICODIN) 5-325 MG per tablet 1 tablet (1 tablet Oral Given 04/21/19 1423)     Initial Impression / Assessment  and Plan / ED Course  I have reviewed the triage vital signs and the nursing notes.  Pertinent labs & imaging results that were available during my care of the patient were reviewed by me and considered in my medical decision making (see chart for details).       Had a lengthy discussion with the patient about her injuries and what this entails.  Patient feels like she would like to be discharged back to her facility and she will use the wheelchair that she has along with a walker.  Have advised her that she will need to follow-up with orthopedics.  I advised her to return for any worsening in her condition.  There is a shared decision making on the process of whether she was being discharged or admitted.  Patient feels like she is able to go home.  She was given pain control here in the emergency department.  Final Clinical Impressions(s) / ED Diagnoses   Final diagnoses:  None    ED Discharge Orders    None       Dalia Heading, PA-C 04/21/19 1449    Sherwood Gambler, MD 04/22/19 [phone]

## 2019-04-21 NOTE — ED Provider Notes (Signed)
  Physical Exam  BP (!) 141/94 (BP Location: Right Arm)   Pulse 86   Temp 98.9 F (37.2 C) (Oral)   Resp (!) 21   Wt 34 kg   SpO2 97%   BMI 14.64 kg/m   Physical Exam  ED Course/Procedures     Procedures  MDM    Unable to tolerate ambulation. SW unable to place her to SNF. Has severe pain when trying to walk and almost fell.  Will admit for pain control and placement.   Varney Biles, MD 04/21/19 [phone]

## 2019-04-21 NOTE — NC FL2 (Signed)
[place] LEVEL OF CARE SCREENING TOOL     IDENTIFICATION  Patient Name: Jenny Torres Birthdate: 1928-07-26 Sex: female Admission Date (Current Location): 04/21/2019  [facility] and [place] Number:  Herbalist and Address:  [facility],  [place] [address], [place]      Provider Number: [id number]  Attending Physician Name and Address:  No att. providers found  Relative Name and Phone Number:       Current Level of Care: Hospital Recommended Level of Care: [place] Prior Approval Number:    Date Approved/Denied: 02/14/14 PASRR Number: [id number]  Discharge Plan: SNF    Current Diagnoses: Patient Active Problem List   Diagnosis Date Noted  . Routine general medical examination at a health care facility 04/12/2018  . Venous insufficiency 12/17/2017  . Physical deconditioning   . Dysuria 09/08/2016  . Syncope and collapse 12/02/2014  . Hyperlipidemia 12/02/2014  . COPD (chronic obstructive pulmonary disease) ([place]) 12/02/2014  . Essential hypertension 12/02/2014  . Insomnia 02/18/2014  . Protein-calorie malnutrition, severe ([place]) 02/12/2014  . Low vitamin B12 level 12/29/2012  . Pulmonary nodule 10/21/2012  . Idiopathic peripheral neuropathy   . MVP (mitral valve prolapse)   . Osteoporosis   . Raynaud's disease 04/24/2011  . Anxiety state 11/10/2007  . Depression 11/10/2007    Orientation RESPIRATION BLADDER Height & Weight     Self, Time, Place  Normal Continent Weight: 74 lb 15.3 oz (34 kg) Height:     BEHAVIORAL SYMPTOMS/MOOD NEUROLOGICAL BOWEL NUTRITION STATUS      Continent Diet  AMBULATORY STATUS COMMUNICATION OF NEEDS Skin   Limited Assist Verbally                         Personal Care Assistance Level of Assistance  Bathing, Dressing Bathing Assistance: Limited assistance   Dressing Assistance: Limited assistance     Functional Limitations Info             SPECIAL CARE  FACTORS FREQUENCY  PT (By licensed PT), OT (By licensed OT)     PT Frequency: 5 OT Frequency: 5            Contractures Contractures Info: Not present    Additional Factors Info  Code Status, Allergies Code Status Info: FULL Allergies Info: Lovastatin, Sulfamethoxazole, Darvocet (Propoxyphene N-acetaminophen), Erythromycin, Hydrocod Polst-cpm Polst Er, Penicillins, Theophyllines, Zovirax (Acyclovir), Ceftin, Paroxetine Hcl           Current Medications (04/21/2019):  This is the current hospital active medication list Current Facility-Administered Medications  Medication Dose Route Frequency Provider Last Rate Last Dose  . cyanocobalamin ((VITAMIN B-12)) injection 1,000 mcg  1,000 mcg Intramuscular Q14 Days Hoyt Koch, MD   1,000 mcg at 07/29/18 1446   Current Outpatient Medications  Medication Sig Dispense Refill  . HYDROcodone-acetaminophen (NORCO/VICODIN) 5-325 MG tablet Take 0.5-1 tablets by mouth every 6 (six) hours as needed for moderate pain. 15 tablet 0  . metoprolol tartrate (LOPRESSOR) 25 MG tablet Take 0.5 tablets (12.5 mg total) by mouth 2 (two) times daily. 90 tablet 1  . ondansetron (ZOFRAN ODT) 4 MG disintegrating tablet Take 1 tablet (4 mg total) by mouth every 4 (four) hours as needed for nausea or vomiting. 60 tablet 5  . sertraline (ZOLOFT) 50 MG tablet TAKE 1 TABLET DAILY 90 tablet 1  . zolpidem (AMBIEN) 5 MG tablet TAKE ONE TABLET AT BEDTIME AS NEEDED FOR SLEEP 30 tablet 3  Discharge Medications: Please see discharge summary for a list of discharge medications.  Relevant Imaging Results:  Relevant Lab Results:   Additional Information [id number]  [name], LCSW

## 2019-04-21 NOTE — ED Triage Notes (Addendum)
Patient BIB EMS from [facility] home. Patient reports trip and fall at approx 0100 this morning. Patient remained on the floor until she could "scoot" over and call the nursing staff via call bell this morning. EMS reported finding patient in a chair with her walker upon arrival. Patient reports pain to right hip. Patient has history of right hip fx with rod placed. Patient also has hx of neuropathy. No shortening/rotation noted by EMS. Pt AxOx4.  Patient has golden ticket DNR at bedside.

## 2019-04-21 NOTE — H&P (Signed)
History and Physical    Jenny Torres [id number] DOB: 05-Oct-1927 DOA: 04/21/2019  PCP: Hoyt Koch, MD  Patient coming from: assisted living facility   Chief Complaint: fall and right hip pain  HPI: Jenny Torres is a 85 y.o. female with medical history significant for right femur fracture several years ago, osteoporosis, htn, mild copd, malnutrition, who presents with above. Says that earlier this morning was searching for her phone in her bedroom, tripped and fell, landing on her right side. Denies loc or headache or laceration. Uses walker to ambulate at baseline. No pain when not moving but right hip pain when does move. Was unable to ambulate after falling. Denies recent illness. No chest pain or sob. No fevers. No cough. No vomiting or diarrhea. No recent med changes. No dysuria. No blood in stool or melena. Denies history of syncope or lightheadedness.  ED Course: imaging, labs  Review of Systems: As per HPI otherwise 10 point review of systems negative.    Past Medical History:  Diagnosis Date   Anorexia    Anxiety    Carotid artery occlusion    COPD (chronic obstructive pulmonary disease) (HCC)    Depression    Diverticula, colon    Gastritis    GERD (gastroesophageal reflux disease)    Hemorrhoids, external    HTN (hypertension)    Hyperlipidemia    Hyperlipidemia    Idiopathic peripheral neuropathy    chronic   MVP (mitral valve prolapse)    stable   Osteoporosis    Palpitations    Polyp of colon    Raynaud's disease     Past Surgical History:  Procedure Laterality Date   CESAREAN SECTION     x2   CHOLECYSTECTOMY     INTRAMEDULLARY (IM) NAIL INTERTROCHANTERIC Right 02/11/2014     reports that she quit smoking about 35 years ago. Her smoking use included cigarettes. She has a 25.00 pack-year smoking history. She has never used smokeless tobacco. She reports current alcohol use. She reports that she does not use  drugs.  Allergies  Allergen Reactions   Lovastatin Other (See Comments)    unknown   Sulfamethoxazole Swelling    Swelling of the throat, mouth, tongue, nose   Darvocet [Propoxyphene N-Acetaminophen] Itching   Erythromycin Nausea And Vomiting    Upset stomach   Hydrocod Polst-Cpm Polst Er Nausea Only    nausea   Penicillins Nausea And Vomiting    "CAN TOLERATE NOW" Has patient had a PCN reaction causing immediate rash, facial/tongue/throat swelling, SOB or lightheadedness with hypotension: No Has patient had a PCN reaction causing severe rash involving mucus membranes or skin necrosis: No Has patient had a PCN reaction that required hospitalization No Has patient had a PCN reaction occurring within the last 10 years: yes If all of the above answers are "NO", then may proceed with Cephalosporin use.    Theophyllines Other (See Comments)    Unknown reaction   Zovirax [Acyclovir] Other (See Comments)    Per patient lots of side effects   Ceftin Diarrhea    GI problems   Paroxetine Hcl Other (See Comments)    jittery    Family History  Problem Relation Age of Onset   Breast cancer Mother    Liver disease Brother    Cirrhosis Brother     Prior to Admission medications   Medication Sig Start Date End Date Taking? Authorizing Provider  HYDROcodone-acetaminophen (NORCO/VICODIN) 5-325 MG tablet Take 0.5-1 tablets by mouth  every 6 (six) hours as needed for moderate pain. 04/21/19   Lawyer, Harrell Gave, PA-C  metoprolol tartrate (LOPRESSOR) 25 MG tablet Take 0.5 tablets (12.5 mg total) by mouth 2 (two) times daily. 01/24/18   Hoyt Koch, MD  ondansetron (ZOFRAN ODT) 4 MG disintegrating tablet Take 1 tablet (4 mg total) by mouth every 4 (four) hours as needed for nausea or vomiting. 04/12/18   Hoyt Koch, MD  sertraline (ZOLOFT) 50 MG tablet TAKE 1 TABLET DAILY 12/23/18   Hoyt Koch, MD  zolpidem (AMBIEN) 5 MG tablet TAKE ONE TABLET AT BEDTIME  AS NEEDED FOR SLEEP 03/21/19   Hoyt Koch, MD    Physical Exam: Vitals:   04/21/19 1420 04/21/19 1600 04/21/19 1630 04/21/19 1845  BP: 121/89 132/82 (!) 169/79 (!) 141/94  Pulse: 94 78 81 86  Resp: 16 (!) 30 (!) 23 (!) 21  Temp:    98.9 F (37.2 C)  TempSrc:    Oral  SpO2: 99% 95% 94% 97%  Weight:        Constitutional: No acute distress. cachectic Head: Atraumatic Eyes: Conjunctiva clear ENM: Moist mucous membranes. Normal dentition.  Neck: Supple Respiratory: Clear to auscultation bilaterally, no wheezing/rales/rhonchi. Normal respiratory effort. No accessory muscle use. . Cardiovascular: Regular rate and rhythm. Mod systolic murmur Abdomen: Non-tender, non-distended. No masses. No rebound or guarding. Positive bowel sounds. Musculoskeletal: No joint deformity upper and lower extremities. Normal ROM, no contractures. Decreased muscle tone.  Skin: No rashes, lesions, or ulcers.  Extremities: No peripheral edema. Palpable peripheral pulses. Neurologic: Alert, moving all 4 extremities. Psychiatric: answers questions intelligbly   Labs on Admission: I have personally reviewed following labs and imaging studies  CBC: Recent Labs  Lab 04/21/19 1711  WBC 10.7*  NEUTROABS 8.2*  HGB 12.5  HCT 38.0  MCV 88.8  PLT 024   Basic Metabolic Panel: Recent Labs  Lab 04/21/19 1711  NA 132*  K 4.3  CL 92*  CO2 28  GLUCOSE 118*  BUN 11  CREATININE 0.63  CALCIUM 9.1   GFR: CrCl cannot be calculated (Unknown ideal weight.). Liver Function Tests: No results for input(s): AST, ALT, ALKPHOS, BILITOT, PROT, ALBUMIN in the last 168 hours. No results for input(s): LIPASE, AMYLASE in the last 168 hours. No results for input(s): AMMONIA in the last 168 hours. Coagulation Profile: No results for input(s): INR, PROTIME in the last 168 hours. Cardiac Enzymes: No results for input(s): CKTOTAL, CKMB, CKMBINDEX, TROPONINI in the last 168 hours. BNP (last 3 results) No  results for input(s): PROBNP in the last 8760 hours. HbA1C: No results for input(s): HGBA1C in the last 72 hours. CBG: No results for input(s): GLUCAP in the last 168 hours. Lipid Profile: No results for input(s): CHOL, HDL, LDLCALC, TRIG, CHOLHDL, LDLDIRECT in the last 72 hours. Thyroid Function Tests: No results for input(s): TSH, T4TOTAL, FREET4, T3FREE, THYROIDAB in the last 72 hours. Anemia Panel: No results for input(s): VITAMINB12, FOLATE, FERRITIN, TIBC, IRON, RETICCTPCT in the last 72 hours. Urine analysis:    Component Value Date/Time   COLORURINE AMBER (A) 10/22/2017 2206   APPEARANCEUR CLEAR 10/22/2017 2206   LABSPEC 1.025 10/22/2017 2206   PHURINE 5.0 10/22/2017 2206   GLUCOSEU NEGATIVE 10/22/2017 2206   HGBUR NEGATIVE 10/22/2017 2206   [place] 10/22/2017 2206   BILIRUBINUR negative 04/15/2017 1507   BILIRUBINUR negative 09/08/2016 1044   KETONESUR 20 (A) 10/22/2017 2206   PROTEINUR 100 (A) 10/22/2017 2206   UROBILINOGEN 1.0 04/15/2017 1507  UROBILINOGEN 1.0 05/31/2015 1600   NITRITE NEGATIVE 10/22/2017 2206   LEUKOCYTESUR SMALL (A) 10/22/2017 2206    Radiological Exams on Admission: Ct Hip Right Wo Contrast  Result Date: 04/21/2019 CLINICAL DATA:  Right hip pain since a trip and fall at 1 a.m. last night. Initial encounter. EXAM: CT OF THE RIGHT HIP WITHOUT CONTRAST TECHNIQUE: Multidetector CT imaging of the right hip was performed according to the standard protocol. Multiplanar CT image reconstructions were also generated. COMPARISON:  Plain films right hip earlier today. FINDINGS: Bones/Joint/Cartilage Nondisplaced fracture of the posterior, inferior aspect of the left ilium is incomplete. The patient also has a nondisplaced fracture of the high right superior pubic ramus. Finally, there is an acute on chronic fracture of the anterior aspect of the right inferior pubic ramus. Remote healed fracture of the posterior aspect of the inferior pubic ramus is  noted. The right hip is located and no acute hip fracture is identified. Healed intertrochanteric fracture with fixation hardware in place noted. Advanced right hip osteoarthritis is present. Ligaments Suboptimally assessed by CT. Muscles and Tendons Intact. Soft tissues No acute abnormality within the pelvis is identified. Status post hysterectomy. Atherosclerosis noted. IMPRESSION: Acute, nondisplaced high right superior pubic ramus fracture. Also seen is an acute on chronic right inferior pubic ramus fracture. Nondisplaced fracture of the posterior, inferior aspect of the right ilium. Negative for acute hip fracture. Healed intertrochanteric fracture noted. Advanced right hip osteoarthritis. Electronically Signed   By: Inge Rise M.D.   On: 04/21/2019 13:19   Dg Chest Port 1 View  Result Date: 04/21/2019 CLINICAL DATA:  Fall.  Pelvic pain. EXAM: PORTABLE CHEST 1 VIEW COMPARISON:  10/22/2017 FINDINGS: The cardiomediastinal silhouette is within normal limits. Aortic atherosclerosis is noted. The lungs are hyperinflated with similar appearance of biapical scarring. No airspace consolidation, edema, pleural effusion, pneumothorax is identified. No acute osseous abnormality is seen. IMPRESSION: No active disease. Electronically Signed   By: Logan Bores M.D.   On: 04/21/2019 16:05   Dg Hip Unilat  With Pelvis 2-3 Views Right  Result Date: 04/21/2019 CLINICAL DATA:  Fall. EXAM: DG HIP (WITH OR WITHOUT PELVIS) 2-3V RIGHT COMPARISON:  05/31/2015. FINDINGS: Prior ORIF right femur. Hardware intact. Anatomic alignment. Diffuse osteopenia and degenerative change. Old right superior and r inferior pubic rami fractures. No acute fractures identified. IMPRESSION: 1. Prior ORIF right femur. Anatomic alignment. Old right superior inferior pubic rami fractures noted. No acute abnormality identified. No evidence of acute fracture or dislocation. 2.  Diffuse osteopenia degenerative change. Electronically Signed   By:  Marcello Moores  Register   On: 04/21/2019 09:24    EKG: Independently reviewed. rsr v1, twi v2  Assessment/Plan Principal Problem:   Pubic ramus fracture (HCC) Active Problems:   Protein-calorie malnutrition, severe (HCC)   Insomnia   Hyponatremia   COPD (chronic obstructive pulmonary disease) (HCC)   # Pubic ramus fractures - acute closed nondisplaced fx of right superior pubic ramus and acute on chronic fx of inferior ramus. Prior ORIF right femur appears intact. No signs of head trauma and head/neck cleared by ED team per discussion w/ EDP. Fall appears to be 2/2 trip. Gait problems, neuropathy, osteoporosis, advanced age, malnutrition, and meds (zolpidem, BB) likely contributory. PT evaluated in ED and pt unable to ambulate 2/2 pain. Not in pain at rest. Called daughter jennifer and updated her w/ events and plan. - PT/OT consults - will need ortho f/u, possibly inpt - f/u vit d - bisphosphonate?  - oxy prn for  pain control - delirium precautions - covid neg - daughter [name] requests she be updated of all care plans/changes and especially discharge planning  # hyponatremia - chronic, mild (132), stable.  - f/u urine sodium, osm  # htn - here bp mildly elevated - cont home metop  # Anorexia - confirmed w/ daughter hx decades anorexia  # Insomnia - cont home ambien, contributes to presenting condition, pt adamant about wanting to continue  DVT prophylaxis: lovenox Code Status: dnr, confirmed w/ daughter pt has dnr order Family Communication: daughter Engineer, civil (consulting)  Disposition Plan: tbd  Consults called: none  Admission status: med/surg    Desma Maxim MD [facility] Pager [phone]  If 7PM-7AM, please contact night-coverage [email or web address] Password [facility]  04/21/2019, 7:44 PM

## 2019-04-21 NOTE — ED Notes (Signed)
Patient daughter updated on POC at this time.

## 2019-04-22 DIAGNOSIS — J449 Chronic obstructive pulmonary disease, unspecified: Secondary | ICD-10-CM | POA: Diagnosis not present

## 2019-04-22 DIAGNOSIS — S3282XA Multiple fractures of pelvis without disruption of pelvic ring, initial encounter for closed fracture: Secondary | ICD-10-CM

## 2019-04-22 DIAGNOSIS — E43 Unspecified severe protein-calorie malnutrition: Secondary | ICD-10-CM | POA: Diagnosis not present

## 2019-04-22 MED ORDER — ENOXAPARIN SODIUM 30 MG/0.3ML ~~LOC~~ SOLN
30.0000 mg | Freq: Every day | SUBCUTANEOUS | Status: DC
  Administered 2019-04-22 – 2019-04-24 (×3): 30 mg via SUBCUTANEOUS
  Filled 2019-04-22 (×3): qty 0.3

## 2019-04-22 NOTE — Progress Notes (Signed)
PROGRESS NOTE    Jenny Torres  UVO:[id number] DOB: 21-Jan-1928 DOA: 04/21/2019 PCP: Hoyt Koch, MD    Brief Narrative:  85 y.o. female with medical history significant for right femur fracture several years ago, osteoporosis, htn, mild copd, malnutrition, who presents with above. Says that earlier this morning was searching for her phone in her bedroom, tripped and fell, landing on her right side. Denies loc or headache or laceration. Uses walker to ambulate at baseline. No pain when not moving but right hip pain when does move. Was unable to ambulate after falling. Denies recent illness. No chest pain or sob. No fevers. No cough. No vomiting or diarrhea. No recent med changes. No dysuria. No blood in stool or melena. Denies history of syncope or lightheadedness  Assessment & Plan:   Principal Problem:   Pubic ramus fracture (HCC) Active Problems:   Protein-calorie malnutrition, severe (HCC)   Insomnia   Hyponatremia   COPD (chronic obstructive pulmonary disease) (HCC)  # Pubic ramus fractures - acute closed nondisplaced fx of right superior pubic ramus and acute on chronic fx of inferior ramus. Prior ORIF right femur appears intact. No signs of head trauma and head/neck cleared by ED team per discussion w/ EDP. Fall appears to be 2/2 trip. - PT/OT consulted, thus far recommendation for SNF. SW consulted - f/u vit d level pending - oxy prn for pain control as tolerated - covid neg  # hyponatremia  - chronic, noted to be 132 at presentation, stable.  - f/u urine sodium  # htn - here bp mildly elevated - Cont on home meds  # Anorexia  - confirmed w/ daughter hx decades anorexia - Stable  # Insomnia - Seems stable  DVT prophylaxis: Lovenox subQ Code Status: DNR Family Communication: Pt in room, family not at bedside Disposition Plan: SNF, timing uncertain  Consultants:     Procedures:     Antimicrobials: Anti-infectives (From admission, onward)   None       Subjective: Without complaints this AM  Objective: Vitals:   04/21/19 2328 04/22/19 0055 04/22/19 0602 04/22/19 1314  BP: 104/67 (!) 105/58 138/87 139/80  Pulse: 72 69 80 66  Resp: 14 16 16 16   Temp: 98 F (36.7 C) 97.7 F (36.5 C) 98 F (36.7 C) (!) 97.4 F (36.3 C)  TempSrc: Oral Oral Oral Oral  SpO2: 99% 98% 98% 98%  Weight:        Intake/Output Summary (Last 24 hours) at 04/22/2019 1508 Last data filed at 04/22/2019 1321 Gross per 24 hour  Intake 900 ml  Output 0 ml  Net 900 ml   Filed Weights   04/21/19 0816  Weight: 34 kg    Examination:  General exam: Appears calm and comfortable  Respiratory system: Clear to auscultation. Respiratory effort normal. Cardiovascular system: S1 & S2 heard, regular Gastrointestinal system: Abdomen is nondistended, soft and nontender. No organomegaly or masses felt. Normal bowel sounds heard. Central nervous system: Alert and oriented. No focal neurological deficits. Extremities: Symmetric 5 x 5 power. Skin: No rashes, lesions Psychiatry: Judgement and insight appear normal. Mood & affect appropriate.   Data Reviewed: I have personally reviewed following labs and imaging studies  CBC: Recent Labs  Lab 04/21/19 1711  WBC 10.7*  NEUTROABS 8.2*  HGB 12.5  HCT 38.0  MCV 88.8  PLT 742   Basic Metabolic Panel: Recent Labs  Lab 04/21/19 1711  NA 132*  K 4.3  CL 92*  CO2 28  GLUCOSE  118*  BUN 11  CREATININE 0.63  CALCIUM 9.1   GFR: CrCl cannot be calculated (Unknown ideal weight.). Liver Function Tests: No results for input(s): AST, ALT, ALKPHOS, BILITOT, PROT, ALBUMIN in the last 168 hours. No results for input(s): LIPASE, AMYLASE in the last 168 hours. No results for input(s): AMMONIA in the last 168 hours. Coagulation Profile: No results for input(s): INR, PROTIME in the last 168 hours. Cardiac Enzymes: No results for input(s): CKTOTAL, CKMB, CKMBINDEX, TROPONINI in the last 168 hours. BNP (last 3  results) No results for input(s): PROBNP in the last 8760 hours. HbA1C: No results for input(s): HGBA1C in the last 72 hours. CBG: No results for input(s): GLUCAP in the last 168 hours. Lipid Profile: No results for input(s): CHOL, HDL, LDLCALC, TRIG, CHOLHDL, LDLDIRECT in the last 72 hours. Thyroid Function Tests: No results for input(s): TSH, T4TOTAL, FREET4, T3FREE, THYROIDAB in the last 72 hours. Anemia Panel: No results for input(s): VITAMINB12, FOLATE, FERRITIN, TIBC, IRON, RETICCTPCT in the last 72 hours. Sepsis Labs: No results for input(s): PROCALCITON, LATICACIDVEN in the last 168 hours.  Recent Results (from the past 240 hour(s))  SARS Coronavirus 2 [facility] order, Performed in [facility] hospital lab)     Status: None   Collection Time: 04/21/19  5:11 PM  Result Value Ref Range Status   SARS Coronavirus 2 NEGATIVE NEGATIVE Final    Comment: (NOTE) If result is NEGATIVE SARS-CoV-2 target nucleic acids are NOT DETECTED. The SARS-CoV-2 RNA is generally detectable in upper and lower  respiratory specimens during the acute phase of infection. The lowest  concentration of SARS-CoV-2 viral copies this assay can detect is 250  copies / mL. A negative result does not preclude SARS-CoV-2 infection  and should not be used as the sole basis for treatment or other  patient management decisions.  A negative result may occur with  improper specimen collection / handling, submission of specimen other  than nasopharyngeal swab, presence of viral mutation(s) within the  areas targeted by this assay, and inadequate number of viral copies  (<250 copies / mL). A negative result must be combined with clinical  observations, patient history, and epidemiological information. If result is POSITIVE SARS-CoV-2 target nucleic acids are DETECTED. The SARS-CoV-2 RNA is generally detectable in upper and lower  respiratory specimens dur ing the acute phase of infection.  Positive  results are  indicative of active infection with SARS-CoV-2.  Clinical  correlation with patient history and other diagnostic information is  necessary to determine patient infection status.  Positive results do  not rule out bacterial infection or co-infection with other viruses. If result is PRESUMPTIVE POSTIVE SARS-CoV-2 nucleic acids MAY BE PRESENT.   A presumptive positive result was obtained on the submitted specimen  and confirmed on repeat testing.  While 2019 novel coronavirus  (SARS-CoV-2) nucleic acids may be present in the submitted sample  additional confirmatory testing may be necessary for epidemiological  and / or clinical management purposes  to differentiate between  SARS-CoV-2 and other Sarbecovirus currently known to infect humans.  If clinically indicated additional testing with an alternate test  methodology [phone]) is advised. The SARS-CoV-2 RNA is generally  detectable in upper and lower respiratory sp ecimens during the acute  phase of infection. The expected result is Negative. Fact Sheet for Patients:  StrictlyIdeas.no Fact Sheet for Healthcare Providers: BankingDealers.co.za This test is not yet approved or cleared by the [place] FDA and has been authorized for detection and/or diagnosis of  SARS-CoV-2 by FDA under an Emergency Use Authorization (EUA).  This EUA will remain in effect (meaning this test can be used) for the duration of the COVID-19 declaration under Section 564(b)(1) of the Act, 21 U.S.C. section 360bbb-3(b)(1), unless the authorization is terminated or revoked sooner. Performed at [facility], [place] [address] [place], [place] [address]      Radiology Studies: Ct Hip Right Wo Contrast  Result Date: 04/21/2019 CLINICAL DATA:  Right hip pain since a trip and fall at 1 a.m. last night. Initial encounter. EXAM: CT OF THE RIGHT HIP WITHOUT CONTRAST TECHNIQUE: Multidetector CT imaging  of the right hip was performed according to the standard protocol. Multiplanar CT image reconstructions were also generated. COMPARISON:  Plain films right hip earlier today. FINDINGS: Bones/Joint/Cartilage Nondisplaced fracture of the posterior, inferior aspect of the left ilium is incomplete. The patient also has a nondisplaced fracture of the high right superior pubic ramus. Finally, there is an acute on chronic fracture of the anterior aspect of the right inferior pubic ramus. Remote healed fracture of the posterior aspect of the inferior pubic ramus is noted. The right hip is located and no acute hip fracture is identified. Healed intertrochanteric fracture with fixation hardware in place noted. Advanced right hip osteoarthritis is present. Ligaments Suboptimally assessed by CT. Muscles and Tendons Intact. Soft tissues No acute abnormality within the pelvis is identified. Status post hysterectomy. Atherosclerosis noted. IMPRESSION: Acute, nondisplaced high right superior pubic ramus fracture. Also seen is an acute on chronic right inferior pubic ramus fracture. Nondisplaced fracture of the posterior, inferior aspect of the right ilium. Negative for acute hip fracture. Healed intertrochanteric fracture noted. Advanced right hip osteoarthritis. Electronically Signed   By: Inge Rise M.D.   On: 04/21/2019 13:19   Dg Chest Port 1 View  Result Date: 04/21/2019 CLINICAL DATA:  Fall.  Pelvic pain. EXAM: PORTABLE CHEST 1 VIEW COMPARISON:  10/22/2017 FINDINGS: The cardiomediastinal silhouette is within normal limits. Aortic atherosclerosis is noted. The lungs are hyperinflated with similar appearance of biapical scarring. No airspace consolidation, edema, pleural effusion, pneumothorax is identified. No acute osseous abnormality is seen. IMPRESSION: No active disease. Electronically Signed   By: Logan Bores M.D.   On: 04/21/2019 16:05   Dg Hip Unilat  With Pelvis 2-3 Views Right  Result Date: 04/21/2019  CLINICAL DATA:  Fall. EXAM: DG HIP (WITH OR WITHOUT PELVIS) 2-3V RIGHT COMPARISON:  05/31/2015. FINDINGS: Prior ORIF right femur. Hardware intact. Anatomic alignment. Diffuse osteopenia and degenerative change. Old right superior and r inferior pubic rami fractures. No acute fractures identified. IMPRESSION: 1. Prior ORIF right femur. Anatomic alignment. Old right superior inferior pubic rami fractures noted. No acute abnormality identified. No evidence of acute fracture or dislocation. 2.  Diffuse osteopenia degenerative change. Electronically Signed   By: Marcello Moores  Register   On: 04/21/2019 09:24    Scheduled Meds: . enoxaparin (LOVENOX) injection  30 mg Subcutaneous QHS  . metoprolol tartrate  12.5 mg Oral BID  . sertraline  50 mg Oral Daily   Continuous Infusions:   LOS: 1 day   Marylu Lund, MD [facility] Pager On Amion  If 7PM-7AM, please contact night-coverage 04/22/2019, 3:08 PM

## 2019-04-22 NOTE — TOC Progression Note (Addendum)
Transition of Care [facility]) - Progression Note    Patient Details  Name: Jenny Torres MRN: [id number] Date of Birth: 10/29/1927  [facility] CM/SW Contact  Ross Ludwig, [place] Phone Number: 04/22/2019, 4:28 PM  Clinical Narrative:     CSW attempted to contact patient's daughter to discuss SNF placement options CSW left message on voice mail.  CSW was informed that patient and her daughter prefer [facility], [place] attempted to contact [place] and left a message on voice mail.  CSW to continue to follow patient's progress throughout discharge planning.  4:50pm  CSW received phone call back from patient's daughter, she would like for CSW to try to contact [facility] again.  CSW informed her that if patient is not accepted to [facility] she will have to consider other options.  Patient's daughter expressed understanding.   Expected Discharge Plan: [place] Barriers to Discharge: Continued Medical Work up  Expected Discharge Plan and Services Expected Discharge Plan: [place] In-house Referral: Clinical Social Work Discharge Planning Services: CM Consult   Living arrangements for the past 2 months: [place]                                       Social Determinants of Health (SDOH) Interventions    Readmission Risk Interventions No flowsheet data found.

## 2019-04-22 NOTE — Evaluation (Signed)
Occupational Therapy Evaluation Patient Details Name: Jenny Torres MRN: [id number] DOB: November 10, 1927 Today's Date: 04/22/2019    History of Present Illness Pt s/p post fall with noted multiple non-displaced pelvic fxs on imaging.  Pt with hx of falls, R hip fx, peripheral neuropathy, anorexia, anxiety and osteoporosis    Clinical Impression   Pt was independent with AD prior to admission. She lives alone at [place]. Pt presents with pain, generalized weakness and decreased standing balance. She demonstrate some difficulty with problem solving with slower processing. Pt requires min to max assist for ADL and min to mod assist for mobility. She will need post acute rehab in SNF. Will follow acutely.    Follow Up Recommendations  SNF;Supervision/Assistance - 24 hour    Equipment Recommendations  3 in 1 bedside commode    Recommendations for Other Services       Precautions / Restrictions Precautions Precautions: Fall Precaution Comments: pt with hx of falls/peripheral neuropathy Restrictions Weight Bearing Restrictions: No      Mobility Bed Mobility Overal bed mobility: Needs Assistance Bed Mobility: Supine to Sit;Sit to Supine     Supine to sit: Min assist Sit to supine: Mod assist   General bed mobility comments: increased time, assist to raise trunk and shift hips to EOB and to return LEs to bed  Transfers Overall transfer level: Needs assistance Equipment used: Rolling walker (2 wheeled) Transfers: Sit to/from [facility] Sit to Stand: Mod assist Stand pivot transfers: Min assist       General transfer comment: assist to rise and steady    Balance Overall balance assessment: Needs assistance   Sitting balance-Leahy Scale: Fair     Standing balance support: Bilateral upper extremity supported Standing balance-Leahy Scale: Poor                             ADL either performed or assessed with clinical judgement    ADL Overall ADL's : Needs assistance/impaired Eating/Feeding: Minimal assistance;Bed level Eating/Feeding Details (indicate cue type and reason): assist to open packages Grooming: Wash/dry hands;Wash/dry face;Sitting;Set up   Upper Body Bathing: Minimal assistance;Sitting   Lower Body Bathing: Maximal assistance;Sit to/from stand   Upper Body Dressing : Set up;Sitting   Lower Body Dressing: Maximal assistance;Sit to/from stand   Toilet Transfer: Minimal assistance;Stand-pivot;RW;BSC   Toileting- Clothing Manipulation and Hygiene: Maximal assistance;Sit to/from stand               Vision Patient Visual Report: No change from baseline       Perception     Praxis      Pertinent Vitals/Pain Pain Assessment: Faces Faces Pain Scale: Hurts little more Pain Location: R hip Pain Descriptors / Indicators: Grimacing;Guarding;Sore Pain Intervention(s): Monitored during session;Repositioned     Hand Dominance Right   Extremity/Trunk Assessment Upper Extremity Assessment Upper Extremity Assessment: Overall WFL for tasks assessed;Generalized weakness   Lower Extremity Assessment Lower Extremity Assessment: Defer to PT evaluation RLE Sensation: history of peripheral neuropathy LLE Sensation: history of peripheral neuropathy   Cervical / Trunk Assessment Cervical / Trunk Assessment: Kyphotic   Communication Communication Communication: No difficulties   Cognition Arousal/Alertness: Awake/alert Behavior During Therapy: WFL for tasks assessed/performed Overall Cognitive Status: Within Functional Limits for tasks assessed                                 General  Comments: somewhat slow processing and decreased problem solving, very pleasant lady   General Comments       Exercises     Shoulder Instructions      Home Living Family/patient expects to be discharged to:: Private residence Living Arrangements: Alone   Type of Home: [place]: One level     Bathroom Shower/Tub: Occupational psychologist: [place]: Environmental consultant - 2 wheels;Shower seat          Prior Functioning/Environment Level of Independence: Independent with assistive device(s)        Comments: RW at all times        OT Problem List: Decreased strength;Impaired balance (sitting and/or standing);Decreased knowledge of use of DME or AE;Pain;Decreased cognition      OT Treatment/Interventions: Self-care/ADL training;DME and/or AE instruction;Patient/family education;Balance training    OT Goals(Current goals can be found in the care plan section) Acute Rehab OT Goals Patient Stated Goal: Regain IND OT Goal Formulation: With patient Time For Goal Achievement: 05/06/19 Potential to Achieve Goals: Good ADL Goals Pt Will Perform Grooming: (P) with min guard assist;standing Pt Will Perform Lower Body Bathing: (P) with min assist;with adaptive equipment;sit to/from stand Pt Will Perform Lower Body Dressing: (P) with min assist;sit to/from stand;with adaptive equipment Pt Will Transfer to Toilet: (P) with min guard assist;ambulating;bedside commode Pt Will Perform Toileting - Clothing Manipulation and hygiene: (P) with min guard assist;sit to/from stand  OT Frequency: Min 2X/week   Barriers to D/C: Decreased caregiver support          Co-evaluation              AM-PAC OT "6 Clicks" Daily Activity     Outcome Measure Help from another person eating meals?: A Little Help from another person taking care of personal grooming?: A Little Help from another person toileting, which includes using toliet, bedpan, or urinal?: A Lot Help from another person bathing (including washing, rinsing, drying)?: A Lot Help from another person to put on and taking off regular upper body clothing?: A Little Help from another person to put on and taking off regular lower body clothing?: A  Lot 6 Click Score: 15   End of Session Equipment Utilized During Treatment: Gait belt;Rolling walker  Activity Tolerance: Patient tolerated treatment well Patient left: in bed;with call bell/phone within reach;with bed alarm set  OT Visit Diagnosis: Unsteadiness on feet (R26.81);Other abnormalities of gait and mobility (R26.89);Pain;Muscle weakness (generalized) (M62.81)                Time: 9563-8756 OT Time Calculation (min): 28 min Charges:  OT General Charges $OT Visit: 1 Visit OT Evaluation $OT Eval Moderate Complexity: 1 Mod OT Treatments $Self Care/Home Management : 8-22 mins  Nestor Lewandowsky, OTR/L Acute Rehabilitation Services Pager: [phone] Office: [phone]  Malka So 04/22/2019, 8:58 AM

## 2019-04-23 DIAGNOSIS — E871 Hypo-osmolality and hyponatremia: Secondary | ICD-10-CM | POA: Diagnosis not present

## 2019-04-23 DIAGNOSIS — S3282XA Multiple fractures of pelvis without disruption of pelvic ring, initial encounter for closed fracture: Secondary | ICD-10-CM | POA: Diagnosis not present

## 2019-04-23 DIAGNOSIS — T07XXXA Unspecified multiple injuries, initial encounter: Secondary | ICD-10-CM | POA: Diagnosis present

## 2019-04-23 NOTE — Care Management CC44 (Signed)
Condition Code 44 Documentation Completed  Patient Details  Name: Jenny Torres MRN: [id number] Date of Birth: 02/28/1928   Condition Code 44 given:  Yes Patient signature on Condition Code 44 notice:  Yes Documentation of 2 MD's agreement:  Yes Code 44 added to claim:  Yes    Joaquin Courts, RN 04/23/2019, 4:01 PM

## 2019-04-23 NOTE — Progress Notes (Signed)
PROGRESS NOTE    Jenny Torres  PPI:[id number] DOB: 22-Dec-1927 DOA: 04/21/2019 PCP: Hoyt Koch, MD    Brief Narrative:  85 y.o. female with medical history significant for right femur fracture several years ago, osteoporosis, htn, mild copd, malnutrition, who presents with above. Says that earlier this morning was searching for her phone in her bedroom, tripped and fell, landing on her right side. Denies loc or headache or laceration. Uses walker to ambulate at baseline. No pain when not moving but right hip pain when does move. Was unable to ambulate after falling. Denies recent illness. No chest pain or sob. No fevers. No cough. No vomiting or diarrhea. No recent med changes. No dysuria. No blood in stool or melena. Denies history of syncope or lightheadedness  Assessment & Plan:   Principal Problem:   Pubic ramus fracture (HCC) Active Problems:   Protein-calorie malnutrition, severe (HCC)   Insomnia   Hyponatremia   COPD (chronic obstructive pulmonary disease) (HCC)  # Pubic ramus fractures - acute closed nondisplaced fx of right superior pubic ramus and acute on chronic fx of inferior ramus. Prior ORIF right femur appears intact. No signs of head trauma and head/neck cleared by ED team per discussion w/ EDP. Fall appears to be 2/2 trip. - PT/OT following with recommendation for SNF. SW following - continued with oxy prn for pain control as tolerated - covid neg  # hyponatremia  - chronic, noted to be 132 at presentation, stable.  -Clinically seem stable  # htn - here bp mildly elevated - Cont on home meds as tolerated  # Anorexia  - Admitting physician confirmed w/ daughter hx decades anorexia - Stable at this time - Did not eat much food this AM - Will consult Nutrition   # Insomnia - Seems stable at this time  DVT prophylaxis: Lovenox subQ Code Status: DNR Family Communication: Pt in room, family not at bedside Disposition Plan: SNF, timing  uncertain  Consultants:     Procedures:     Antimicrobials: Anti-infectives (From admission, onward)   None      Subjective: No complaints this AM. Does not feel hungry and did not eat much breakfast  Objective: Vitals:   04/22/19 1314 04/22/19 2042 04/23/19 0700 04/23/19 1338  BP: 139/80 136/84 (!) 149/78 129/66  Pulse: 66 68 66 68  Resp: 16 15 15 16   Temp: (!) 97.4 F (36.3 C) (!) 97.5 F (36.4 C) 97.7 F (36.5 C) 98.2 F (36.8 C)  TempSrc: Oral Oral Oral Oral  SpO2: 98% 98% 94% 97%  Weight:        Intake/Output Summary (Last 24 hours) at 04/23/2019 1523 Last data filed at 04/23/2019 1104 Gross per 24 hour  Intake 480 ml  Output 300 ml  Net 180 ml   Filed Weights   04/21/19 0816  Weight: 34 kg    Examination: General exam: Awake, laying in bed, in nad Respiratory system: Normal respiratory effort, no wheezing Cardiovascular system: regular rate, s1, s2 Gastrointestinal system: Soft, nondistended, positive BS Central nervous system: CN2-12 grossly intact, strength intact Extremities: Perfused, no clubbing Skin: Normal skin turgor, no notable skin lesions seen Psychiatry: Mood normal // no visual hallucinations \  Data Reviewed: I have personally reviewed following labs and imaging studies  CBC: Recent Labs  Lab 04/21/19 1711  WBC 10.7*  NEUTROABS 8.2*  HGB 12.5  HCT 38.0  MCV 88.8  PLT 063   Basic Metabolic Panel: Recent Labs  Lab 04/21/19 1711  NA 132*  K 4.3  CL 92*  CO2 28  GLUCOSE 118*  BUN 11  CREATININE 0.63  CALCIUM 9.1   GFR: CrCl cannot be calculated (Unknown ideal weight.). Liver Function Tests: No results for input(s): AST, ALT, ALKPHOS, BILITOT, PROT, ALBUMIN in the last 168 hours. No results for input(s): LIPASE, AMYLASE in the last 168 hours. No results for input(s): AMMONIA in the last 168 hours. Coagulation Profile: No results for input(s): INR, PROTIME in the last 168 hours. Cardiac Enzymes: No results for  input(s): CKTOTAL, CKMB, CKMBINDEX, TROPONINI in the last 168 hours. BNP (last 3 results) No results for input(s): PROBNP in the last 8760 hours. HbA1C: No results for input(s): HGBA1C in the last 72 hours. CBG: No results for input(s): GLUCAP in the last 168 hours. Lipid Profile: No results for input(s): CHOL, HDL, LDLCALC, TRIG, CHOLHDL, LDLDIRECT in the last 72 hours. Thyroid Function Tests: No results for input(s): TSH, T4TOTAL, FREET4, T3FREE, THYROIDAB in the last 72 hours. Anemia Panel: No results for input(s): VITAMINB12, FOLATE, FERRITIN, TIBC, IRON, RETICCTPCT in the last 72 hours. Sepsis Labs: No results for input(s): PROCALCITON, LATICACIDVEN in the last 168 hours.  Recent Results (from the past 240 hour(s))  SARS Coronavirus 2 [facility] order, Performed in [facility] hospital lab)     Status: None   Collection Time: 04/21/19  5:11 PM  Result Value Ref Range Status   SARS Coronavirus 2 NEGATIVE NEGATIVE Final    Comment: (NOTE) If result is NEGATIVE SARS-CoV-2 target nucleic acids are NOT DETECTED. The SARS-CoV-2 RNA is generally detectable in upper and lower  respiratory specimens during the acute phase of infection. The lowest  concentration of SARS-CoV-2 viral copies this assay can detect is 250  copies / mL. A negative result does not preclude SARS-CoV-2 infection  and should not be used as the sole basis for treatment or other  patient management decisions.  A negative result may occur with  improper specimen collection / handling, submission of specimen other  than nasopharyngeal swab, presence of viral mutation(s) within the  areas targeted by this assay, and inadequate number of viral copies  (<250 copies / mL). A negative result must be combined with clinical  observations, patient history, and epidemiological information. If result is POSITIVE SARS-CoV-2 target nucleic acids are DETECTED. The SARS-CoV-2 RNA is generally detectable in upper and lower   respiratory specimens dur ing the acute phase of infection.  Positive  results are indicative of active infection with SARS-CoV-2.  Clinical  correlation with patient history and other diagnostic information is  necessary to determine patient infection status.  Positive results do  not rule out bacterial infection or co-infection with other viruses. If result is PRESUMPTIVE POSTIVE SARS-CoV-2 nucleic acids MAY BE PRESENT.   A presumptive positive result was obtained on the submitted specimen  and confirmed on repeat testing.  While 2019 novel coronavirus  (SARS-CoV-2) nucleic acids may be present in the submitted sample  additional confirmatory testing may be necessary for epidemiological  and / or clinical management purposes  to differentiate between  SARS-CoV-2 and other Sarbecovirus currently known to infect humans.  If clinically indicated additional testing with an alternate test  methodology [phone]) is advised. The SARS-CoV-2 RNA is generally  detectable in upper and lower respiratory sp ecimens during the acute  phase of infection. The expected result is Negative. Fact Sheet for Patients:  StrictlyIdeas.no Fact Sheet for Healthcare Providers: BankingDealers.co.za This test is not yet approved or cleared by  the [employer] and has been authorized for detection and/or diagnosis of SARS-CoV-2 by FDA under an Emergency Use Authorization (EUA).  This EUA will remain in effect (meaning this test can be used) for the duration of the COVID-19 declaration under Section 564(b)(1) of the Act, 21 U.S.C. section 360bbb-3(b)(1), unless the authorization is terminated or revoked sooner. Performed at [facility], [place] [address] [place], [place] [address]      Radiology Studies: Dg Chest Port 1 View  Result Date: 04/21/2019 CLINICAL DATA:  Fall.  Pelvic pain. EXAM: PORTABLE CHEST 1 VIEW COMPARISON:  10/22/2017  FINDINGS: The cardiomediastinal silhouette is within normal limits. Aortic atherosclerosis is noted. The lungs are hyperinflated with similar appearance of biapical scarring. No airspace consolidation, edema, pleural effusion, pneumothorax is identified. No acute osseous abnormality is seen. IMPRESSION: No active disease. Electronically Signed   By: Logan Bores M.D.   On: 04/21/2019 16:05    Scheduled Meds: . enoxaparin (LOVENOX) injection  30 mg Subcutaneous QHS  . metoprolol tartrate  12.5 mg Oral BID  . sertraline  50 mg Oral Daily   Continuous Infusions:   LOS: 2 days   Marylu Lund, MD [facility] Pager On Amion  If 7PM-7AM, please contact night-coverage 04/23/2019, 3:23 PM

## 2019-04-23 NOTE — Plan of Care (Signed)
  Problem: Education: Goal: Knowledge of General Education information will improve Description: Including pain rating scale, medication(s)/side effects and non-pharmacologic comfort measures 04/23/2019 1709 by Audrea Muscat, RN Outcome: Progressing 04/23/2019 1709 by Audrea Muscat, RN Outcome: Progressing   Problem: Health Behavior/Discharge Planning: Goal: Ability to manage health-related needs will improve 04/23/2019 1709 by Audrea Muscat, RN Outcome: Progressing 04/23/2019 1709 by Audrea Muscat, RN Outcome: Progressing   Problem: Clinical Measurements: Goal: Ability to maintain clinical measurements within normal limits will improve 04/23/2019 1709 by Audrea Muscat, RN Outcome: Progressing 04/23/2019 1709 by Audrea Muscat, RN Outcome: Progressing Goal: Will remain free from infection 04/23/2019 1709 by Audrea Muscat, RN Outcome: Progressing 04/23/2019 1709 by Audrea Muscat, RN Outcome: Progressing Goal: Diagnostic test results will improve 04/23/2019 1709 by Audrea Muscat, RN Outcome: Progressing 04/23/2019 1709 by Audrea Muscat, RN Outcome: Progressing Goal: Respiratory complications will improve 04/23/2019 1709 by Audrea Muscat, RN Outcome: Progressing 04/23/2019 1709 by Audrea Muscat, RN Outcome: Progressing Goal: Cardiovascular complication will be avoided 04/23/2019 1709 by Audrea Muscat, RN Outcome: Progressing 04/23/2019 1709 by Audrea Muscat, RN Outcome: Progressing   Problem: Activity: Goal: Risk for activity intolerance will decrease 04/23/2019 1709 by Audrea Muscat, RN Outcome: Progressing 04/23/2019 1709 by Audrea Muscat, RN Outcome: Progressing   Problem: Nutrition: Goal: Adequate nutrition will be maintained 04/23/2019 1709 by Audrea Muscat, RN Outcome: Progressing 04/23/2019 1709 by Audrea Muscat, RN Outcome: Progressing   Problem: Coping: Goal: Level of anxiety will decrease 04/23/2019 1709 by Audrea Muscat, RN Outcome:  Progressing 04/23/2019 1709 by Audrea Muscat, RN Outcome: Progressing   Problem: Elimination: Goal: Will not experience complications related to bowel motility 04/23/2019 1709 by Audrea Muscat, RN Outcome: Progressing 04/23/2019 1709 by Audrea Muscat, RN Outcome: Progressing Goal: Will not experience complications related to urinary retention 04/23/2019 1709 by Audrea Muscat, RN Outcome: Progressing 04/23/2019 1709 by Audrea Muscat, RN Outcome: Progressing   Problem: Pain Managment: Goal: General experience of comfort will improve 04/23/2019 1709 by Audrea Muscat, RN Outcome: Progressing 04/23/2019 1709 by Audrea Muscat, RN Outcome: Progressing   Problem: Safety: Goal: Ability to remain free from injury will improve 04/23/2019 1709 by Audrea Muscat, RN Outcome: Progressing 04/23/2019 1709 by Audrea Muscat, RN Outcome: Progressing   Problem: Skin Integrity: Goal: Risk for impaired skin integrity will decrease 04/23/2019 1709 by Audrea Muscat, RN Outcome: Progressing 04/23/2019 1709 by Audrea Muscat, RN Outcome: Progressing

## 2019-04-23 NOTE — Care Management Obs Status (Signed)
[facility] NOTIFICATION   Patient Details  Name: Jenny Torres MRN: [id number] Date of Birth: 09-01-1928   Medicare Observation Status Notification Given:  Yes    Joaquin Courts, RN 04/23/2019, 4:01 PM

## 2019-04-23 NOTE — Patient Care Conference (Signed)
Tried calling patient's daughter at number listed to give update. No answer.

## 2019-04-24 DIAGNOSIS — E871 Hypo-osmolality and hyponatremia: Secondary | ICD-10-CM | POA: Diagnosis not present

## 2019-04-24 DIAGNOSIS — S3282XA Multiple fractures of pelvis without disruption of pelvic ring, initial encounter for closed fracture: Secondary | ICD-10-CM | POA: Diagnosis not present

## 2019-04-24 MED ORDER — BOOST / RESOURCE BREEZE PO LIQD CUSTOM
1.0000 | Freq: Two times a day (BID) | ORAL | Status: DC
  Administered 2019-04-25: 1 via ORAL

## 2019-04-24 MED ORDER — ADULT MULTIVITAMIN W/MINERALS CH
1.0000 | ORAL_TABLET | Freq: Every day | ORAL | Status: DC
  Administered 2019-04-25: 09:00:00 1 via ORAL
  Filled 2019-04-24: qty 1

## 2019-04-24 MED ORDER — ENSURE ENLIVE PO LIQD
237.0000 mL | Freq: Two times a day (BID) | ORAL | Status: DC
  Administered 2019-04-24 – 2019-04-25 (×3): 237 mL via ORAL

## 2019-04-24 NOTE — Plan of Care (Signed)

## 2019-04-24 NOTE — Progress Notes (Signed)
PROGRESS NOTE    Jenny Torres  KKX:[id number] DOB: [birth year] DOA: 04/21/2019 PCP: Hoyt Koch, MD    Brief Narrative:  85 y.o. female with medical history significant for right femur fracture several years ago, osteoporosis, htn, mild copd, malnutrition, who presents with above. Says that earlier this morning was searching for her phone in her bedroom, tripped and fell, landing on her right side. Denies loc or headache or laceration. Uses walker to ambulate at baseline. No pain when not moving but right hip pain when does move. Was unable to ambulate after falling. Denies recent illness. No chest pain or sob. No fevers. No cough. No vomiting or diarrhea. No recent med changes. No dysuria. No blood in stool or melena. Denies history of syncope or lightheadedness  Assessment & Plan:   Principal Problem:   Pubic ramus fracture (HCC) Active Problems:   Protein-calorie malnutrition, severe (HCC)   Insomnia   Hyponatremia   COPD (chronic obstructive pulmonary disease) (HCC)   Multiple closed pelvic fractures without disruption of pelvic circle (HCC)   Multiple fractures  # Pubic ramus fractures - acute closed nondisplaced fx of right superior pubic ramus and acute on chronic fx of inferior ramus. Prior ORIF right femur appears intact. No signs of head trauma and head/neck cleared by ED team per discussion w/ EDP. Fall appears to be 2/2 trip. - PT/OT following with recommendation for SNF. - continued with oxy prn for pain control as tolerated - covid neg. SW following for SNF placement, pending  # hyponatremia  - chronic, noted to be 132 at presentation, stable.  -Clinically seem stable  # htn - here bp mildly elevated - Cont on home meds as tolerated  # Anorexia  - Admitting physician confirmed w/ daughter hx decades anorexia - Stable at this time - Did not eat much food this AM - Consulted nutrition  # Insomnia - Seems stable at this time  DVT prophylaxis:  Lovenox subQ Code Status: DNR Family Communication: Pt in room, family not at bedside Disposition Plan: SNF, timing uncertain  Consultants:     Procedures:     Antimicrobials: Anti-infectives (From admission, onward)   None      Subjective: Without complaints Objective: Vitals:   04/23/19 1338 04/23/19 2031 04/24/19 0452 04/24/19 0921  BP: 129/66 (!) 146/87 (!) 178/80 96/71  Pulse: 68 76 74 74  Resp: 16 16 16    Temp: 98.2 F (36.8 C) 98.6 F (37 C) 97.7 F (36.5 C)   TempSrc: Oral Oral Oral   SpO2: 97% 94% 97% 95%  Weight:        Intake/Output Summary (Last 24 hours) at 04/24/2019 1357 Last data filed at 04/24/2019 0909 Gross per 24 hour  Intake 580 ml  Output 500 ml  Net 80 ml   Filed Weights   04/21/19 0816  Weight: 34 kg    Examination: General exam: Conversant, in no acute distress Respiratory system: normal chest rise, clear, no audible wheezing Cardiovascular system: regular rhythm, s1-s2 Gastrointestinal system: Nondistended, nontender, pos BS Central nervous system: No seizures, no tremors Extremities: No cyanosis, no joint deformities Skin: No rashes, no pallor Psychiatry: Affect normal // no auditory hallucinations   Data Reviewed: I have personally reviewed following labs and imaging studies  CBC: Recent Labs  Lab 04/21/19 1711  WBC 10.7*  NEUTROABS 8.2*  HGB 12.5  HCT 38.0  MCV 88.8  PLT 169   Basic Metabolic Panel: Recent Labs  Lab 04/21/19 1711  NA 132*  K 4.3  CL 92*  CO2 28  GLUCOSE 118*  BUN 11  CREATININE 0.63  CALCIUM 9.1   GFR: CrCl cannot be calculated (Unknown ideal weight.). Liver Function Tests: No results for input(s): AST, ALT, ALKPHOS, BILITOT, PROT, ALBUMIN in the last 168 hours. No results for input(s): LIPASE, AMYLASE in the last 168 hours. No results for input(s): AMMONIA in the last 168 hours. Coagulation Profile: No results for input(s): INR, PROTIME in the last 168 hours. Cardiac Enzymes: No  results for input(s): CKTOTAL, CKMB, CKMBINDEX, TROPONINI in the last 168 hours. BNP (last 3 results) No results for input(s): PROBNP in the last 8760 hours. HbA1C: No results for input(s): HGBA1C in the last 72 hours. CBG: No results for input(s): GLUCAP in the last 168 hours. Lipid Profile: No results for input(s): CHOL, HDL, LDLCALC, TRIG, CHOLHDL, LDLDIRECT in the last 72 hours. Thyroid Function Tests: No results for input(s): TSH, T4TOTAL, FREET4, T3FREE, THYROIDAB in the last 72 hours. Anemia Panel: No results for input(s): VITAMINB12, FOLATE, FERRITIN, TIBC, IRON, RETICCTPCT in the last 72 hours. Sepsis Labs: No results for input(s): PROCALCITON, LATICACIDVEN in the last 168 hours.  Recent Results (from the past 240 hour(s))  SARS Coronavirus 2 [facility] order, Performed in [facility] hospital lab)     Status: None   Collection Time: 04/21/19  5:11 PM  Result Value Ref Range Status   SARS Coronavirus 2 NEGATIVE NEGATIVE Final    Comment: (NOTE) If result is NEGATIVE SARS-CoV-2 target nucleic acids are NOT DETECTED. The SARS-CoV-2 RNA is generally detectable in upper and lower  respiratory specimens during the acute phase of infection. The lowest  concentration of SARS-CoV-2 viral copies this assay can detect is 250  copies / mL. A negative result does not preclude SARS-CoV-2 infection  and should not be used as the sole basis for treatment or other  patient management decisions.  A negative result may occur with  improper specimen collection / handling, submission of specimen other  than nasopharyngeal swab, presence of viral mutation(s) within the  areas targeted by this assay, and inadequate number of viral copies  (<250 copies / mL). A negative result must be combined with clinical  observations, patient history, and epidemiological information. If result is POSITIVE SARS-CoV-2 target nucleic acids are DETECTED. The SARS-CoV-2 RNA is generally detectable in upper and  lower  respiratory specimens dur ing the acute phase of infection.  Positive  results are indicative of active infection with SARS-CoV-2.  Clinical  correlation with patient history and other diagnostic information is  necessary to determine patient infection status.  Positive results do  not rule out bacterial infection or co-infection with other viruses. If result is PRESUMPTIVE POSTIVE SARS-CoV-2 nucleic acids MAY BE PRESENT.   A presumptive positive result was obtained on the submitted specimen  and confirmed on repeat testing.  While 2019 novel coronavirus  (SARS-CoV-2) nucleic acids may be present in the submitted sample  additional confirmatory testing may be necessary for epidemiological  and / or clinical management purposes  to differentiate between  SARS-CoV-2 and other Sarbecovirus currently known to infect humans.  If clinically indicated additional testing with an alternate test  methodology [id number]) is advised. The SARS-CoV-2 RNA is generally  detectable in upper and lower respiratory sp ecimens during the acute  phase of infection. The expected result is Negative. Fact Sheet for Patients:  StrictlyIdeas.no Fact Sheet for Healthcare Providers: BankingDealers.co.za This test is not yet approved or cleared by the [place]  FDA and has been authorized for detection and/or diagnosis of SARS-CoV-2 by FDA under an Emergency Use Authorization (EUA).  This EUA will remain in effect (meaning this test can be used) for the duration of the COVID-19 declaration under Section 564(b)(1) of the Act, 21 U.S.C. section 360bbb-3(b)(1), unless the authorization is terminated or revoked sooner. Performed at [facility], [place] [address], [place],  [address]      Radiology Studies: No results found.  Scheduled Meds: . enoxaparin (LOVENOX) injection  30 mg Subcutaneous QHS  . feeding supplement  1 Container  Oral BID BM  . feeding supplement (ENSURE ENLIVE)  237 mL Oral BID BM  . metoprolol tartrate  12.5 mg Oral BID  . [START ON 04/25/2019] multivitamin with minerals  1 tablet Oral Daily  . sertraline  50 mg Oral Daily   Continuous Infusions:   LOS: 2 days   Marylu Lund, MD [facility] Pager On Amion  If 7PM-7AM, please contact night-coverage 04/24/2019, 1:57 PM

## 2019-04-24 NOTE — Progress Notes (Signed)
Occupational Therapy Treatment Patient Details Name: Jenny Torres MRN: [id number] DOB: Jul 24, 1928 Today's Date: 04/24/2019    History of present illness Pt s/p post fall with noted multiple non-displaced pelvic fxs on imaging.  Pt with hx of falls, R hip fx, peripheral neuropathy, anorexia, anxiety and osteoporosis    OT comments  Pt aware she needs SNF  Follow Up Recommendations  SNF;Supervision/Assistance - 24 hour    Equipment Recommendations  3 in 1 bedside commode    Recommendations for Other Services      Precautions / Restrictions Precautions Precautions: Fall       Mobility Bed Mobility               General bed mobility comments: pt OOB  Transfers   Equipment used: Rolling walker (2 wheeled) Transfers: Sit to/from [facility] Sit to Stand: Min assist;Mod assist Stand pivot transfers: Min assist;Mod assist       General transfer comment: assist to rise and steady    Balance Overall balance assessment: Needs assistance   Sitting balance-Leahy Scale: Fair     Standing balance support: Bilateral upper extremity supported Standing balance-Leahy Scale: Poor                             ADL either performed or assessed with clinical judgement   ADL Overall ADL's : Needs assistance/impaired     Grooming: Wash/dry hands;Wash/dry face;Sitting;Set up       Lower Body Bathing: Maximal assistance;Sit to/from stand;Cueing for compensatory techniques;Cueing for safety;Cueing for sequencing       Lower Body Dressing: Maximal assistance;Sitting/lateral leans;Sit to/from stand;Cueing for compensatory techniques;Cueing for sequencing   Toilet Transfer: Minimal assistance;Stand-pivot;RW;BSC   Toileting- Clothing Manipulation and Hygiene: Maximal assistance;Sit to/from stand         General ADL Comments: Pt agreeable to SNF.  Pt understands she will need ST SNF     Vision Patient Visual Report: No change from  baseline            Cognition Arousal/Alertness: Awake/alert Behavior During Therapy: WFL for tasks assessed/performed Overall Cognitive Status: Within Functional Limits for tasks assessed                                                     Pertinent Vitals/ Pain       Faces Pain Scale: Hurts little more Pain Location: R hip     Prior Functioning/Environment              Frequency  Min 2X/week        Progress Toward Goals  OT Goals(current goals can now be found in the care plan section)  Progress towards OT goals: Progressing toward goals     Plan Discharge plan remains appropriate       AM-PAC OT "6 Clicks" Daily Activity     Outcome Measure   Help from another person eating meals?: A Little Help from another person taking care of personal grooming?: A Little Help from another person toileting, which includes using toliet, bedpan, or urinal?: A Lot Help from another person bathing (including washing, rinsing, drying)?: A Lot Help from another person to put on and taking off regular upper body clothing?: A Little Help from another person to put on and taking off regular  lower body clothing?: A Lot 6 Click Score: 15    End of Session Equipment Utilized During Treatment: Gait belt;Rolling walker  OT Visit Diagnosis: Unsteadiness on feet (R26.81);Other abnormalities of gait and mobility (R26.89);Pain;Muscle weakness (generalized) (M62.81)   Activity Tolerance Patient tolerated treatment well   Patient Left with call bell/phone within reach;with bed alarm set;in chair   Nurse Communication          Time: 1040-1100 OT Time Calculation (min): 20 min  Charges: OT General Charges $OT Visit: 1 Visit OT Treatments $Self Care/Home Management : 8-22 mins  Kari Baars, [place] [phone] Office- [phone]      Romesha Scherer, Edwena Felty D 04/24/2019, 2:10 PM

## 2019-04-24 NOTE — Progress Notes (Signed)
Initial Nutrition Assessment  RD working remotely.   DOCUMENTATION CODES:   Underweight  INTERVENTION:  - will order Boost Breeze BID, each supplement provides 250 kcal and 9 grams of protein, - will order Ensure Enlive BID, each supplement provides 350 kcal and 20 grams of protein. - will order daily multivitamin with minerals. - continue to encourage PO intakes.  - weigh patient today.  - recommend consult to Psychiatry d/t hx of anorexia nervosa.    NUTRITION DIAGNOSIS:   Increased nutrient needs related to acute illness, post-op healing as evidenced by estimated needs.  GOAL:   Patient will meet greater than or equal to 90% of their needs  MONITOR:   PO intake, Supplement acceptance, Labs, Weight trends  REASON FOR ASSESSMENT:   Consult Assessment of nutrition requirement/status  ASSESSMENT:   85 y.o. female with medical history significant for R femur fracture several years ago, osteoporosis, HTN, mild COPD, and malnutrition. She was searching for her phone in her bedroom, tripped and fell, and landed on her right side. She did not have any pain with staying still but does have significant R hip pain when attempting to move. She was unable to ambulate after the fall; she uses a walker to ambulate at baseline.  Per RN flow sheet, patient consumed 25% of breakfast and 100% of lunch on 8/1, 100% of lunch and 75% of dinner on 8/2, and 50% of breakfast on 8/3.   Per chart review, current weight is 75 lb. This weight is the same as recordings on 08/23/18, 07/18/18, and 04/12/18. Re-weigh patient to make sure that weight wasn't copied forward. Suspect some degree of malnutrition but unable to confirm without performing NFPE.   Reviewed RD note from 09/15/16 which indicates that patient's daughter reported patient has extensive history of anorexia nervosa. At that time patient met criteria for severe malnutrition in the context of social/environmental circumstances as evidenced  by energy intake of >/= 1 month, severe muscle, and severe fat depletion.    Per notes: - R hip fracture--previous ORIF, acute closed non-displaced fracture - hyponatremia--clinically stable - anorexia--"admitting physical confirmed with daughter hx of decades of anorexia  Labs reviewed; Na: 132 mmol/l, Cl: 92 mmol/l. Medications reviewed.     NUTRITION - FOCUSED PHYSICAL EXAM:  unable to complete at this time.   Diet Order:   Diet Order            Diet regular Room service appropriate? Yes; Fluid consistency: Thin  Diet effective now              EDUCATION NEEDS:   No education needs have been identified at this time  Skin:  Skin Assessment: Reviewed RN Assessment  Last BM:  PTA/unknown  Height:   Ht Readings from Last 1 Encounters:  08/23/18 5' (1.524 m)    Weight:   Wt Readings from Last 1 Encounters:  04/21/19 34 kg    Ideal Body Weight:  45.4 kg  BMI:  Body mass index is 14.64 kg/m.  Estimated Nutritional Needs:   Kcal:  1360-1530 kcal  Protein:  60-70 grams  Fluid:  >/= 1.6 L/day     Jarome Matin, MS, RD, LDN, [facility] Inpatient Clinical Dietitian Pager # [phone] After hours/weekend pager # [phone]

## 2019-04-25 DIAGNOSIS — S3282XD Multiple fractures of pelvis without disruption of pelvic ring, subsequent encounter for fracture with routine healing: Secondary | ICD-10-CM

## 2019-04-25 DIAGNOSIS — T07XXXA Unspecified multiple injuries, initial encounter: Secondary | ICD-10-CM | POA: Diagnosis not present

## 2019-04-25 DIAGNOSIS — S3282XA Multiple fractures of pelvis without disruption of pelvic ring, initial encounter for closed fracture: Secondary | ICD-10-CM | POA: Diagnosis not present

## 2019-04-25 DIAGNOSIS — E871 Hypo-osmolality and hyponatremia: Secondary | ICD-10-CM | POA: Diagnosis not present

## 2019-04-25 MED ORDER — ENSURE ENLIVE PO LIQD: 237.0000 mL | Freq: Two times a day (BID) | ORAL | 0 refills | Status: AC

## 2019-04-25 MED ORDER — OXYCODONE HCL 5 MG PO TABS: 5.0000 mg | ORAL_TABLET | ORAL | 0 refills | Status: DC | PRN

## 2019-04-25 NOTE — Progress Notes (Signed)
Physical Therapy Treatment Patient Details Name: Jenny Torres MRN: [id number] DOB: 13-Nov-1927 Today's Date: 04/25/2019    History of Present Illness Pt s/p post fall with noted multiple non-displaced pelvic fxs on imaging.  Pt with hx of falls, R hip fx, peripheral neuropathy, anorexia, anxiety and osteoporosis     PT Comments    Pt is making steady progress with mobility. Pt is ambulating 100 ft with min guard assist. Pt did demonstrate some decreased balance and activity tolerance. Pt is agreeable to SNF placement for more therapy to regain independence. Pt will continue to benefit from acute skilled PT to maximize mobility and independence for next venue of care.  Follow Up Recommendations  SNF     Equipment Recommendations  None recommended by PT    Recommendations for Other Services       Precautions / Restrictions Precautions Precautions: Fall Precaution Comments: pt with hx of falls/peripheral neuropathy    Mobility  Bed Mobility               General bed mobility comments: sitting  in recliner  Transfers Overall transfer level: Needs assistance Equipment used: Rolling walker (2 wheeled) Transfers: Sit to/from Stand Sit to Stand: Min assist Stand pivot transfers: Min assist       General transfer comment: assist to rise and steady  Ambulation/Gait Ambulation/Gait assistance: Min guard Gait Distance (Feet): 100 Feet Assistive device: Rolling walker (2 wheeled) Gait Pattern/deviations: Step-through pattern;Decreased stride length;Trunk flexed;Narrow base of support Gait velocity: decr   General Gait Details: cues for upright posture, cues for a wider BOS to improve balance   Stairs             Wheelchair Mobility    Modified Rankin (Stroke Patients Only)       Balance Overall balance assessment: Needs assistance Sitting-balance support: No upper extremity supported Sitting balance-Leahy Scale: Fair     Standing balance support:  Bilateral upper extremity supported Standing balance-Leahy Scale: Fair                              Cognition Arousal/Alertness: Awake/alert Behavior During Therapy: WFL for tasks assessed/performed Overall Cognitive Status: Within Functional Limits for tasks assessed                                        Exercises General Exercises - Lower Extremity Ankle Circles/Pumps: AROM;Both;10 reps;Seated Long Arc Quad: AROM;Strengthening;Both;10 reps;Seated    General Comments        Pertinent Vitals/Pain Pain Assessment: No/denies pain    Home Living                      Prior Function            PT Goals (current goals can now be found in the care plan section) Progress towards PT goals: Progressing toward goals    Frequency    Min 3X/week      PT Plan Current plan remains appropriate    Co-evaluation              AM-PAC PT "6 Clicks" Mobility   Outcome Measure  Help needed turning from your back to your side while in a flat bed without using bedrails?: A Little Help needed moving from lying on your back to sitting on the side of a flat  bed without using bedrails?: A Little Help needed moving to and from a bed to a chair (including a wheelchair)?: A Little Help needed standing up from a chair using your arms (e.g., wheelchair or bedside chair)?: A Little Help needed to walk in hospital room?: A Little Help needed climbing 3-5 steps with a railing? : A Little 6 Click Score: 18    End of Session Equipment Utilized During Treatment: Gait belt Activity Tolerance: Patient tolerated treatment well Patient left: in bed;with call bell/phone within reach Nurse Communication: Mobility status PT Visit Diagnosis: Difficulty in walking, not elsewhere classified (R26.2);Muscle weakness (generalized) (M62.81);History of falling (Z91.81);Pain     Time: 4193-7902 PT Time Calculation (min) (ACUTE ONLY): 15 min  Charges:  $Gait  Training: 8-22 mins                     Theodoro Grist, PT   Lelon Mast 04/25/2019, 12:33 PM

## 2019-04-25 NOTE — Discharge Summary (Signed)
Physician Discharge Summary  Jenny Torres SPQ:[id number] DOB: 03-08-1928 DOA: 04/21/2019  PCP: Hoyt Koch, MD  Admit date: 04/21/2019 Discharge date: 04/25/2019  Admitted From: ALF Disposition:  SNF  Recommendations for Outpatient Follow-up:  1. Follow up with PCP in 1-2 weeks  Discharge Condition:Stable CODE STATUS:DNR Diet recommendation: Regular   Brief/Interim Summary: 85 y.o.femalewith medical history significant forright femur fracture several years ago, osteoporosis, htn, mild copd, malnutrition, who presents with above. Says that earlier this morning was searching for her phone in her bedroom, tripped and fell, landing on her right side. Denies loc or headache or laceration. Uses walker to ambulate at baseline. No pain when not moving but right hip pain when does move. Was unable to ambulate after falling. Denies recent illness. No chest pain or sob. No fevers. No cough. No vomiting or diarrhea. No recent med changes. No dysuria. No blood in stool or melena. Denies history of syncope or lightheadedness  Discharge Diagnoses:  Principal Problem:   Pubic ramus fracture (HCC) Active Problems:   Protein-calorie malnutrition, severe (HCC)   Insomnia   Hyponatremia   COPD (chronic obstructive pulmonary disease) (HCC)   Multiple closed pelvic fractures without disruption of pelvic circle (HCC)   Multiple fractures  # Pubic ramus fractures - acute closed nondisplaced fx of right superior pubic ramus and acute on chronic fx of inferior ramus. Prior ORIF right femur appears intact. No signs of head trauma and head/neck cleared by ED team per discussion w/ EDP. Fall appears to be 2/2 trip. - PT/OT following with recommendation for SNF. - continued with oxy prn for pain control as tolerated - covid neg. SW following for SNF placement  # hyponatremia  - chronic, noted to be 132 at presentation, stable.  -Clinically seem stable  # htn - here bp mildly elevated - Cont  on home meds as tolerated  # Anorexia  - Admitting physician confirmed w/ daughter hx decades anorexia - Stable at this time - Consulted nutrition, recommendation for nutritional supplementation  # Insomnia - Seems stable at this time   Discharge Instructions   Allergies as of 04/25/2019      Reactions   Lovastatin Other (See Comments)   unknown   Sulfamethoxazole Swelling   Swelling of the throat, mouth, tongue, nose   Darvocet [propoxyphene N-acetaminophen] Itching   Erythromycin Nausea And Vomiting   Upset stomach   Hydrocod Polst-cpm Polst Er Nausea Only   nausea   Penicillins Nausea And Vomiting   "CAN TOLERATE NOW" Has patient had a PCN reaction causing immediate rash, facial/tongue/throat swelling, SOB or lightheadedness with hypotension: No Has patient had a PCN reaction causing severe rash involving mucus membranes or skin necrosis: No Has patient had a PCN reaction that required hospitalization No Has patient had a PCN reaction occurring within the last 10 years: yes If all of the above answers are "NO", then may proceed with Cephalosporin use.   Theophyllines Other (See Comments)   Unknown reaction   Zovirax [acyclovir] Other (See Comments)   Per patient lots of side effects   Ceftin Diarrhea   GI problems   Paroxetine Hcl Other (See Comments)   jittery      Medication List    TAKE these medications   feeding supplement (ENSURE ENLIVE) Liqd Take 237 mLs by mouth 2 (two) times daily between meals.   metoprolol tartrate 25 MG tablet Commonly known as: LOPRESSOR Take 0.5 tablets (12.5 mg total) by mouth 2 (two) times daily.  ondansetron 4 MG disintegrating tablet Commonly known as: Zofran ODT Take 1 tablet (4 mg total) by mouth every 4 (four) hours as needed for nausea or vomiting.   oxyCODONE 5 MG immediate release tablet Commonly known as: Oxy IR/ROXICODONE Take 1-2 tablets (5-10 mg total) by mouth every 4 (four) hours as needed for breakthrough  pain ((for MODERATE breakthrough pain)).   sertraline 50 MG tablet Commonly known as: ZOLOFT TAKE 1 TABLET DAILY   zolpidem 5 MG tablet Commonly known as: AMBIEN TAKE ONE TABLET AT BEDTIME AS NEEDED FOR SLEEP What changed: See the new instructions.      Follow-up Information    Mcarthur Rossetti, MD.   Specialty: Orthopedic Surgery Contact information: [address] [place] [address] [phone]          Allergies  Allergen Reactions  . Lovastatin Other (See Comments)    unknown  . Sulfamethoxazole Swelling    Swelling of the throat, mouth, tongue, nose  . Darvocet [Propoxyphene N-Acetaminophen] Itching  . Erythromycin Nausea And Vomiting    Upset stomach  . Hydrocod Polst-Cpm Polst Er Nausea Only    nausea  . Penicillins Nausea And Vomiting    "CAN TOLERATE NOW" Has patient had a PCN reaction causing immediate rash, facial/tongue/throat swelling, SOB or lightheadedness with hypotension: No Has patient had a PCN reaction causing severe rash involving mucus membranes or skin necrosis: No Has patient had a PCN reaction that required hospitalization No Has patient had a PCN reaction occurring within the last 10 years: yes If all of the above answers are "NO", then may proceed with Cephalosporin use.   . Theophyllines Other (See Comments)    Unknown reaction  . Zovirax [Acyclovir] Other (See Comments)    Per patient lots of side effects  . Ceftin Diarrhea    GI problems  . Paroxetine Hcl Other (See Comments)    jittery    Procedures/Studies: Ct Hip Right Wo Contrast  Result Date: 04/21/2019 CLINICAL DATA:  Right hip pain since a trip and fall at 1 a.m. last night. Initial encounter. EXAM: CT OF THE RIGHT HIP WITHOUT CONTRAST TECHNIQUE: Multidetector CT imaging of the right hip was performed according to the standard protocol. Multiplanar CT image reconstructions were also generated. COMPARISON:  Plain films right hip earlier today. FINDINGS:  Bones/Joint/Cartilage Nondisplaced fracture of the posterior, inferior aspect of the left ilium is incomplete. The patient also has a nondisplaced fracture of the high right superior pubic ramus. Finally, there is an acute on chronic fracture of the anterior aspect of the right inferior pubic ramus. Remote healed fracture of the posterior aspect of the inferior pubic ramus is noted. The right hip is located and no acute hip fracture is identified. Healed intertrochanteric fracture with fixation hardware in place noted. Advanced right hip osteoarthritis is present. Ligaments Suboptimally assessed by CT. Muscles and Tendons Intact. Soft tissues No acute abnormality within the pelvis is identified. Status post hysterectomy. Atherosclerosis noted. IMPRESSION: Acute, nondisplaced high right superior pubic ramus fracture. Also seen is an acute on chronic right inferior pubic ramus fracture. Nondisplaced fracture of the posterior, inferior aspect of the right ilium. Negative for acute hip fracture. Healed intertrochanteric fracture noted. Advanced right hip osteoarthritis. Electronically Signed   By: Inge Rise M.D.   On: 04/21/2019 13:19   Dg Chest Port 1 View  Result Date: 04/21/2019 CLINICAL DATA:  Fall.  Pelvic pain. EXAM: PORTABLE CHEST 1 VIEW COMPARISON:  10/22/2017 FINDINGS: The cardiomediastinal silhouette is within  normal limits. Aortic atherosclerosis is noted. The lungs are hyperinflated with similar appearance of biapical scarring. No airspace consolidation, edema, pleural effusion, pneumothorax is identified. No acute osseous abnormality is seen. IMPRESSION: No active disease. Electronically Signed   By: Logan Bores M.D.   On: 04/21/2019 16:05   Dg Hip Unilat  With Pelvis 2-3 Views Right  Result Date: 04/21/2019 CLINICAL DATA:  Fall. EXAM: DG HIP (WITH OR WITHOUT PELVIS) 2-3V RIGHT COMPARISON:  05/31/2015. FINDINGS: Prior ORIF right femur. Hardware intact. Anatomic alignment. Diffuse osteopenia  and degenerative change. Old right superior and r inferior pubic rami fractures. No acute fractures identified. IMPRESSION: 1. Prior ORIF right femur. Anatomic alignment. Old right superior inferior pubic rami fractures noted. No acute abnormality identified. No evidence of acute fracture or dislocation. 2.  Diffuse osteopenia degenerative change. Electronically Signed   By: Marcello Moores  Register   On: 04/21/2019 09:24     Subjective: Eager to go to SNF  Discharge Exam: Vitals:   04/24/19 2105 04/25/19 0437  BP: (!) 146/67 (!) 159/70  Pulse: 78 75  Resp: 16 18  Temp: (!) 97.5 F (36.4 C) 97.7 F (36.5 C)  SpO2: 98% 99%   Vitals:   04/24/19 1432 04/24/19 1938 04/24/19 2105 04/25/19 0437  BP: (!) 187/91  (!) 146/67 (!) 159/70  Pulse: 61  78 75  Resp: 16  16 18   Temp: 98.7 F (37.1 C)  (!) 97.5 F (36.4 C) 97.7 F (36.5 C)  TempSrc:   Oral   SpO2: 100%  98% 99%  Weight:  30.7 kg    Height:  5\' 3"  (1.6 m)      General: Pt is alert, awake, not in acute distress Cardiovascular: RRR, S1/S2 +, no rubs, no gallops Respiratory: CTA bilaterally, no wheezing, no rhonchi Abdominal: Soft, NT, ND, bowel sounds + Extremities: no edema, no cyanosis   The results of significant diagnostics from this hospitalization (including imaging, microbiology, ancillary and laboratory) are listed below for reference.     Microbiology: Recent Results (from the past 240 hour(s))  SARS Coronavirus 2 [facility] order, Performed in [facility] hospital lab)     Status: None   Collection Time: 04/21/19  5:11 PM  Result Value Ref Range Status   SARS Coronavirus 2 NEGATIVE NEGATIVE Final    Comment: (NOTE) If result is NEGATIVE SARS-CoV-2 target nucleic acids are NOT DETECTED. The SARS-CoV-2 RNA is generally detectable in upper and lower  respiratory specimens during the acute phase of infection. The lowest  concentration of SARS-CoV-2 viral copies this assay can detect is 250  copies / mL. A negative  result does not preclude SARS-CoV-2 infection  and should not be used as the sole basis for treatment or other  patient management decisions.  A negative result may occur with  improper specimen collection / handling, submission of specimen other  than nasopharyngeal swab, presence of viral mutation(s) within the  areas targeted by this assay, and inadequate number of viral copies  (<250 copies / mL). A negative result must be combined with clinical  observations, patient history, and epidemiological information. If result is POSITIVE SARS-CoV-2 target nucleic acids are DETECTED. The SARS-CoV-2 RNA is generally detectable in upper and lower  respiratory specimens dur ing the acute phase of infection.  Positive  results are indicative of active infection with SARS-CoV-2.  Clinical  correlation with patient history and other diagnostic information is  necessary to determine patient infection status.  Positive results do  not rule out  bacterial infection or co-infection with other viruses. If result is PRESUMPTIVE POSTIVE SARS-CoV-2 nucleic acids MAY BE PRESENT.   A presumptive positive result was obtained on the submitted specimen  and confirmed on repeat testing.  While 2019 novel coronavirus  (SARS-CoV-2) nucleic acids may be present in the submitted sample  additional confirmatory testing may be necessary for epidemiological  and / or clinical management purposes  to differentiate between  SARS-CoV-2 and other Sarbecovirus currently known to infect humans.  If clinically indicated additional testing with an alternate test  methodology [phone]) is advised. The SARS-CoV-2 RNA is generally  detectable in upper and lower respiratory sp ecimens during the acute  phase of infection. The expected result is Negative. Fact Sheet for Patients:  StrictlyIdeas.no Fact Sheet for Healthcare Providers: BankingDealers.co.za This test is not yet  approved or cleared by the [place] FDA and has been authorized for detection and/or diagnosis of SARS-CoV-2 by FDA under an Emergency Use Authorization (EUA).  This EUA will remain in effect (meaning this test can be used) for the duration of the COVID-19 declaration under Section 564(b)(1) of the Act, 21 U.S.C. section 360bbb-3(b)(1), unless the authorization is terminated or revoked sooner. Performed at [facility], [place] [address] [place], [place] [address]      Labs: BNP (last 3 results) No results for input(s): BNP in the last 8760 hours. Basic Metabolic Panel: Recent Labs  Lab 04/21/19 1711  NA 132*  K 4.3  CL 92*  CO2 28  GLUCOSE 118*  BUN 11  CREATININE 0.63  CALCIUM 9.1   Liver Function Tests: No results for input(s): AST, ALT, ALKPHOS, BILITOT, PROT, ALBUMIN in the last 168 hours. No results for input(s): LIPASE, AMYLASE in the last 168 hours. No results for input(s): AMMONIA in the last 168 hours. CBC: Recent Labs  Lab 04/21/19 1711  WBC 10.7*  NEUTROABS 8.2*  HGB 12.5  HCT 38.0  MCV 88.8  PLT 284   Cardiac Enzymes: No results for input(s): CKTOTAL, CKMB, CKMBINDEX, TROPONINI in the last 168 hours. BNP: Invalid input(s): POCBNP CBG: No results for input(s): GLUCAP in the last 168 hours. D-Dimer No results for input(s): DDIMER in the last 72 hours. Hgb A1c No results for input(s): HGBA1C in the last 72 hours. Lipid Profile No results for input(s): CHOL, HDL, LDLCALC, TRIG, CHOLHDL, LDLDIRECT in the last 72 hours. Thyroid function studies No results for input(s): TSH, T4TOTAL, T3FREE, THYROIDAB in the last 72 hours.  Invalid input(s): FREET3 Anemia work up No results for input(s): VITAMINB12, FOLATE, FERRITIN, TIBC, IRON, RETICCTPCT in the last 72 hours. Urinalysis    Component Value Date/Time   COLORURINE AMBER (A) 10/22/2017 2206   APPEARANCEUR CLEAR 10/22/2017 2206   LABSPEC 1.025 10/22/2017 2206   PHURINE 5.0  10/22/2017 2206   GLUCOSEU NEGATIVE 10/22/2017 2206   HGBUR NEGATIVE 10/22/2017 2206   [place] 10/22/2017 2206   BILIRUBINUR negative 04/15/2017 1507   BILIRUBINUR negative 09/08/2016 1044   KETONESUR 20 (A) 10/22/2017 2206   PROTEINUR 100 (A) 10/22/2017 2206   UROBILINOGEN 1.0 04/15/2017 1507   UROBILINOGEN 1.0 05/31/2015 1600   NITRITE NEGATIVE 10/22/2017 2206   LEUKOCYTESUR SMALL (A) 10/22/2017 2206   Sepsis Labs Invalid input(s): PROCALCITONIN,  WBC,  LACTICIDVEN Microbiology Recent Results (from the past 240 hour(s))  SARS Coronavirus 2 [facility] order, Performed in [place] hospital lab)     Status: None   Collection Time: 04/21/19  5:11 PM  Result Value Ref Range Status  SARS Coronavirus 2 NEGATIVE NEGATIVE Final    Comment: (NOTE) If result is NEGATIVE SARS-CoV-2 target nucleic acids are NOT DETECTED. The SARS-CoV-2 RNA is generally detectable in upper and lower  respiratory specimens during the acute phase of infection. The lowest  concentration of SARS-CoV-2 viral copies this assay can detect is 250  copies / mL. A negative result does not preclude SARS-CoV-2 infection  and should not be used as the sole basis for treatment or other  patient management decisions.  A negative result may occur with  improper specimen collection / handling, submission of specimen other  than nasopharyngeal swab, presence of viral mutation(s) within the  areas targeted by this assay, and inadequate number of viral copies  (<250 copies / mL). A negative result must be combined with clinical  observations, patient history, and epidemiological information. If result is POSITIVE SARS-CoV-2 target nucleic acids are DETECTED. The SARS-CoV-2 RNA is generally detectable in upper and lower  respiratory specimens dur ing the acute phase of infection.  Positive  results are indicative of active infection with SARS-CoV-2.  Clinical  correlation with patient history and other  diagnostic information is  necessary to determine patient infection status.  Positive results do  not rule out bacterial infection or co-infection with other viruses. If result is PRESUMPTIVE POSTIVE SARS-CoV-2 nucleic acids MAY BE PRESENT.   A presumptive positive result was obtained on the submitted specimen  and confirmed on repeat testing.  While 2019 novel coronavirus  (SARS-CoV-2) nucleic acids may be present in the submitted sample  additional confirmatory testing may be necessary for epidemiological  and / or clinical management purposes  to differentiate between  SARS-CoV-2 and other Sarbecovirus currently known to infect humans.  If clinically indicated additional testing with an alternate test  methodology [phone]) is advised. The SARS-CoV-2 RNA is generally  detectable in upper and lower respiratory sp ecimens during the acute  phase of infection. The expected result is Negative. Fact Sheet for Patients:  StrictlyIdeas.no Fact Sheet for Healthcare Providers: BankingDealers.co.za This test is not yet approved or cleared by the [place] FDA and has been authorized for detection and/or diagnosis of SARS-CoV-2 by FDA under an Emergency Use Authorization (EUA).  This EUA will remain in effect (meaning this test can be used) for the duration of the COVID-19 declaration under Section 564(b)(1) of the Act, 21 U.S.C. section 360bbb-3(b)(1), unless the authorization is terminated or revoked sooner. Performed at [facility], [place] [address] [place], [place] [address]    Time spent: 31min  SIGNED:   Marylu Lund, MD  [facility] 04/25/2019, 10:53 AM  If 7PM-7AM, please contact night-coverage

## 2019-04-25 NOTE — Plan of Care (Signed)
Patient discharged to [facility]. Report called to [phone]. [facility] to transport patient at 1500. Discharge packet sent to facility with [facility].

## 2019-04-25 NOTE — TOC Transition Note (Signed)
Transition of Care [facility]) - CM/SW Discharge Note   Patient Details  Name: Jenny Torres MRN: [id number] Date of Birth: 12/04/1927  [facility] CM/SW Contact:  Leeroy Cha, RN Phone Number: 04/25/2019, 11:19 AM   Clinical Narrative:   [facility] called for transport to [facility] place at 3 pm per request form [facility].   Final next level of care: Skilled Nursing Facility Barriers to Discharge: No Barriers Identified   Patient Goals and CMS Choice Patient states their goals for this hospitalization and ongoing recovery are:: want to be able to walk and move CMS Medicare.gov Compare Post Acute Care list provided to:: Patient Represenative (must comment)([name]) Choice offered to / list presented to : Patient, Adult Children  Discharge Placement                       Discharge Plan and Services In-house Referral: Clinical Social Work Discharge Planning Services: CM Consult                                 Social Determinants of Health (SDOH) Interventions     Readmission Risk Interventions No flowsheet data found.

## 2019-04-26 ENCOUNTER — Telehealth: Payer: Self-pay | Admitting: *Deleted

## 2019-04-26 NOTE — Telephone Encounter (Signed)
Pt was on TCM report she was admitted 04/21/19 for Pubic ramus fracture. Pt was D/C 04/25/19 to SNF. Per summary after leaving SNF will need to f/u w/PCP in 1-2 wks.Marland KitchenJohny Chess

## 2019-05-17 ENCOUNTER — Ambulatory Visit: Payer: Medicare Other | Admitting: Orthopaedic Surgery

## 2019-05-18 ENCOUNTER — Telehealth: Payer: Self-pay | Admitting: Internal Medicine

## 2019-05-18 NOTE — Telephone Encounter (Signed)
Patient should still have refills of this medication at her pharmacy and it is too early to refill as she last filled on 05/10/2019 wouldn't be due till 06/10/2019

## 2019-05-18 NOTE — Telephone Encounter (Signed)
Medication: zolpidem (AMBIEN) 5 MG tablet [id number]   Has the patient contacted their pharmacy? Yes  (Agent: If no, request that the patient contact the pharmacy for the refill.) (Agent: If yes, when and what did the pharmacy advise?)  Preferred Pharmacy (with phone number or street name): [place], [place] - [address] [phone] (Phone) [phone] (Fax)    Agent: Please be advised that RX refills may take up to 3 business days. We ask that you follow-up with your pharmacy.

## 2019-05-28 ENCOUNTER — Encounter (HOSPITAL_COMMUNITY): Payer: Self-pay | Admitting: *Deleted

## 2019-05-28 ENCOUNTER — Inpatient Hospital Stay (HOSPITAL_COMMUNITY)
Admission: EM | Admit: 2019-05-28 | Discharge: 2019-06-01 | DRG: 183 | Disposition: A | Discharge status: Hospice | Attending: General Surgery | Admitting: General Surgery

## 2019-05-28 ENCOUNTER — Other Ambulatory Visit: Payer: Self-pay

## 2019-05-28 ENCOUNTER — Emergency Department (HOSPITAL_COMMUNITY)

## 2019-05-28 DIAGNOSIS — Z66 Do not resuscitate: Secondary | ICD-10-CM | POA: Diagnosis not present

## 2019-05-28 DIAGNOSIS — S72012A Unspecified intracapsular fracture of left femur, initial encounter for closed fracture: Secondary | ICD-10-CM | POA: Diagnosis not present

## 2019-05-28 DIAGNOSIS — J449 Chronic obstructive pulmonary disease, unspecified: Secondary | ICD-10-CM | POA: Diagnosis present

## 2019-05-28 DIAGNOSIS — I341 Nonrheumatic mitral (valve) prolapse: Secondary | ICD-10-CM | POA: Diagnosis present

## 2019-05-28 DIAGNOSIS — Z87891 Personal history of nicotine dependence: Secondary | ICD-10-CM

## 2019-05-28 DIAGNOSIS — S72002A Fracture of unspecified part of neck of left femur, initial encounter for closed fracture: Secondary | ICD-10-CM | POA: Diagnosis present

## 2019-05-28 DIAGNOSIS — G4700 Insomnia, unspecified: Secondary | ICD-10-CM | POA: Diagnosis present

## 2019-05-28 DIAGNOSIS — S2249XA Multiple fractures of ribs, unspecified side, initial encounter for closed fracture: Secondary | ICD-10-CM | POA: Diagnosis present

## 2019-05-28 DIAGNOSIS — Z882 Allergy status to sulfonamides status: Secondary | ICD-10-CM

## 2019-05-28 DIAGNOSIS — S2243XA Multiple fractures of ribs, bilateral, initial encounter for closed fracture: Principal | ICD-10-CM | POA: Diagnosis present

## 2019-05-28 DIAGNOSIS — Z803 Family history of malignant neoplasm of breast: Secondary | ICD-10-CM

## 2019-05-28 DIAGNOSIS — I7300 Raynaud's syndrome without gangrene: Secondary | ICD-10-CM | POA: Diagnosis present

## 2019-05-28 DIAGNOSIS — Z9119 Patient's noncompliance with other medical treatment and regimen: Secondary | ICD-10-CM

## 2019-05-28 DIAGNOSIS — S2242XA Multiple fractures of ribs, left side, initial encounter for closed fracture: Secondary | ICD-10-CM | POA: Diagnosis not present

## 2019-05-28 DIAGNOSIS — W19XXXA Unspecified fall, initial encounter: Secondary | ICD-10-CM | POA: Diagnosis not present

## 2019-05-28 DIAGNOSIS — S72002D Fracture of unspecified part of neck of left femur, subsequent encounter for closed fracture with routine healing: Secondary | ICD-10-CM | POA: Diagnosis not present

## 2019-05-28 DIAGNOSIS — Z681 Body mass index (BMI) 19 or less, adult: Secondary | ICD-10-CM | POA: Diagnosis not present

## 2019-05-28 DIAGNOSIS — S42002A Fracture of unspecified part of left clavicle, initial encounter for closed fracture: Secondary | ICD-10-CM | POA: Diagnosis present

## 2019-05-28 DIAGNOSIS — A310 Pulmonary mycobacterial infection: Secondary | ICD-10-CM | POA: Diagnosis not present

## 2019-05-28 DIAGNOSIS — I10 Essential (primary) hypertension: Secondary | ICD-10-CM | POA: Diagnosis present

## 2019-05-28 DIAGNOSIS — Z515 Encounter for palliative care: Secondary | ICD-10-CM | POA: Diagnosis not present

## 2019-05-28 DIAGNOSIS — Z880 Allergy status to penicillin: Secondary | ICD-10-CM

## 2019-05-28 DIAGNOSIS — E43 Unspecified severe protein-calorie malnutrition: Secondary | ICD-10-CM | POA: Diagnosis present

## 2019-05-28 DIAGNOSIS — G609 Hereditary and idiopathic neuropathy, unspecified: Secondary | ICD-10-CM | POA: Diagnosis present

## 2019-05-28 DIAGNOSIS — Y92099 Unspecified place in other non-institutional residence as the place of occurrence of the external cause: Secondary | ICD-10-CM

## 2019-05-28 DIAGNOSIS — E785 Hyperlipidemia, unspecified: Secondary | ICD-10-CM | POA: Diagnosis not present

## 2019-05-28 DIAGNOSIS — F419 Anxiety disorder, unspecified: Secondary | ICD-10-CM | POA: Diagnosis present

## 2019-05-28 DIAGNOSIS — K219 Gastro-esophageal reflux disease without esophagitis: Secondary | ICD-10-CM | POA: Diagnosis present

## 2019-05-28 DIAGNOSIS — Z881 Allergy status to other antibiotic agents status: Secondary | ICD-10-CM | POA: Diagnosis not present

## 2019-05-28 DIAGNOSIS — Z20828 Contact with and (suspected) exposure to other viral communicable diseases: Secondary | ICD-10-CM | POA: Diagnosis present

## 2019-05-28 DIAGNOSIS — Z888 Allergy status to other drugs, medicaments and biological substances status: Secondary | ICD-10-CM

## 2019-05-28 DIAGNOSIS — M810 Age-related osteoporosis without current pathological fracture: Secondary | ICD-10-CM | POA: Diagnosis present

## 2019-05-28 DIAGNOSIS — W1830XA Fall on same level, unspecified, initial encounter: Secondary | ICD-10-CM | POA: Diagnosis present

## 2019-05-28 DIAGNOSIS — H9190 Unspecified hearing loss, unspecified ear: Secondary | ICD-10-CM | POA: Diagnosis present

## 2019-05-28 DIAGNOSIS — Z9181 History of falling: Secondary | ICD-10-CM | POA: Diagnosis not present

## 2019-05-28 DIAGNOSIS — Z7189 Other specified counseling: Secondary | ICD-10-CM | POA: Diagnosis not present

## 2019-05-28 LAB — COMPREHENSIVE METABOLIC PANEL
ALT: 23 U/L (ref 0–44)
AST: 32 U/L (ref 15–41)
Albumin: 3.8 g/dL (ref 3.5–5.0)
Alkaline Phosphatase: 220 U/L — ABNORMAL HIGH (ref 38–126)
Anion gap: 11 (ref 5–15)
BUN: 9 mg/dL (ref 8–23)
CO2: 26 mmol/L (ref 22–32)
Calcium: 9.1 mg/dL (ref 8.9–10.3)
Chloride: 93 mmol/L — ABNORMAL LOW (ref 98–111)
Creatinine, Ser: 0.43 mg/dL — ABNORMAL LOW (ref 0.44–1.00)
GFR calc Af Amer: >60 mL/min (ref >60)
GFR calc non Af Amer: >60 mL/min (ref >60)
Glucose, Bld: 119 mg/dL — ABNORMAL HIGH (ref 70–99)
Potassium: 4.1 mmol/L (ref 3.5–5.1)
Sodium: 130 mmol/L — ABNORMAL LOW (ref 135–145)
Total Bilirubin: 0.9 mg/dL (ref 0.3–1.2)
Total Protein: 7.2 g/dL (ref 6.5–8.1)

## 2019-05-28 LAB — URINALYSIS, ROUTINE W REFLEX MICROSCOPIC
Bilirubin Urine: NEGATIVE
Glucose, UA: 50 mg/dL — AB
Hgb urine dipstick: NEGATIVE
Ketones, ur: NEGATIVE mg/dL
Leukocytes,Ua: NEGATIVE
Nitrite: NEGATIVE
Protein, ur: NEGATIVE mg/dL
Specific Gravity, Urine: 1.011 (ref 1.005–1.030)
pH: 8 (ref 5.0–8.0)

## 2019-05-28 LAB — CBC WITH DIFFERENTIAL/PLATELET
Abs Immature Granulocytes: 0.13 10*3/uL — ABNORMAL HIGH (ref 0.00–0.07)
Basophils Absolute: 0 10*3/uL (ref 0.0–0.1)
Basophils Relative: 0 %
Eosinophils Absolute: 0 10*3/uL (ref 0.0–0.5)
Eosinophils Relative: 0 %
HCT: 37.4 % (ref 36.0–46.0)
Hemoglobin: 12.3 g/dL (ref 12.0–15.0)
Immature Granulocytes: 1 %
Lymphocytes Relative: 3 %
Lymphs Abs: 0.6 10*3/uL — ABNORMAL LOW (ref 0.7–4.0)
MCH: 29.1 pg (ref 26.0–34.0)
MCHC: 32.9 g/dL (ref 30.0–36.0)
MCV: 88.4 fL (ref 80.0–100.0)
Monocytes Absolute: 1 10*3/uL (ref 0.1–1.0)
Monocytes Relative: 6 %
Neutro Abs: 16.1 10*3/uL — ABNORMAL HIGH (ref 1.7–7.7)
Neutrophils Relative %: 90 %
Platelets: 345 10*3/uL (ref 150–400)
RBC: 4.23 MIL/uL (ref 3.87–5.11)
RDW: 12.8 % (ref 11.5–15.5)
WBC: 17.9 10*3/uL — ABNORMAL HIGH (ref 4.0–10.5)
nRBC: 0 % (ref 0.0–0.2)

## 2019-05-28 LAB — SARS CORONAVIRUS 2 BY RT PCR (HOSPITAL ORDER, PERFORMED IN ~~LOC~~ HOSPITAL LAB): SARS Coronavirus 2: NEGATIVE

## 2019-05-28 LAB — CK: Total CK: 200 U/L (ref 38–234)

## 2019-05-28 MED ORDER — HYDROCODONE-ACETAMINOPHEN 5-325 MG PO TABS
1.0000 | ORAL_TABLET | ORAL | Status: DC | PRN
  Administered 2019-05-28 – 2019-05-29 (×2): 1 via ORAL
  Administered 2019-05-30: 2 via ORAL
  Administered 2019-05-30: 1 via ORAL
  Administered 2019-05-31 – 2019-06-01 (×4): 2 via ORAL
  Filled 2019-05-28: qty 2
  Filled 2019-05-28: qty 1
  Filled 2019-05-28 (×2): qty 2
  Filled 2019-05-28: qty 1
  Filled 2019-05-28 (×2): qty 2
  Filled 2019-05-28: qty 1

## 2019-05-28 MED ORDER — VANCOMYCIN HCL IN DEXTROSE 750-5 MG/150ML-% IV SOLN
750.0000 mg | Freq: Once | INTRAVENOUS | Status: AC
  Administered 2019-05-28: 750 mg via INTRAVENOUS
  Filled 2019-05-28: qty 150

## 2019-05-28 MED ORDER — ZOLPIDEM TARTRATE 5 MG PO TABS
5.0000 mg | ORAL_TABLET | Freq: Every evening | ORAL | Status: DC | PRN
  Administered 2019-05-29 – 2019-06-01 (×4): 5 mg via ORAL
  Filled 2019-05-28 (×5): qty 1

## 2019-05-28 MED ORDER — SODIUM CHLORIDE 0.9 % IV SOLN: 2.0000 g | Freq: Once | INTRAVENOUS | Status: DC

## 2019-05-28 MED ORDER — SODIUM CHLORIDE 0.9 % IV SOLN
2.0000 g | Freq: Once | INTRAVENOUS | Status: AC
  Administered 2019-05-28: 2 g via INTRAVENOUS
  Filled 2019-05-28: qty 2

## 2019-05-28 MED ORDER — WHITE PETROLATUM EX OINT
TOPICAL_OINTMENT | CUTANEOUS | Status: AC
  Administered 2019-05-29: 08:00:00
  Filled 2019-05-28: qty 28.35

## 2019-05-28 MED ORDER — SODIUM CHLORIDE (PF) 0.9 % IJ SOLN
INTRAMUSCULAR | Status: AC
  Filled 2019-05-28: qty 50

## 2019-05-28 MED ORDER — ACETAMINOPHEN 650 MG RE SUPP: 650.0000 mg | Freq: Four times a day (QID) | RECTAL | Status: DC | PRN

## 2019-05-28 MED ORDER — ONDANSETRON 4 MG PO TBDP
4.0000 mg | ORAL_TABLET | Freq: Four times a day (QID) | ORAL | Status: DC | PRN
  Administered 2019-05-31: 4 mg via ORAL
  Filled 2019-05-28: qty 1

## 2019-05-28 MED ORDER — SODIUM CHLORIDE 0.9 % IV SOLN
500.0000 mg | Freq: Once | INTRAVENOUS | Status: AC
  Administered 2019-05-28: 16:00:00 500 mg via INTRAVENOUS
  Filled 2019-05-28: qty 500

## 2019-05-28 MED ORDER — HYDROMORPHONE HCL 1 MG/ML IJ SOLN
0.5000 mg | INTRAMUSCULAR | Status: DC | PRN
  Administered 2019-05-28 – 2019-06-01 (×2): 0.5 mg via INTRAVENOUS
  Filled 2019-05-28 (×2): qty 1

## 2019-05-28 MED ORDER — TRAMADOL HCL 50 MG PO TABS
50.0000 mg | ORAL_TABLET | Freq: Four times a day (QID) | ORAL | Status: DC | PRN
  Administered 2019-05-29: 50 mg via ORAL
  Filled 2019-05-28 (×2): qty 1

## 2019-05-28 MED ORDER — KCL IN DEXTROSE-NACL 20-5-0.45 MEQ/L-%-% IV SOLN
INTRAVENOUS | Status: DC
  Administered 2019-05-28 – 2019-05-30 (×4): via INTRAVENOUS
  Filled 2019-05-28 (×4): qty 1000

## 2019-05-28 MED ORDER — FENTANYL CITRATE (PF) 100 MCG/2ML IJ SOLN
50.0000 ug | Freq: Once | INTRAMUSCULAR | Status: AC
  Administered 2019-05-28: 50 ug via INTRAVENOUS
  Filled 2019-05-28: qty 2

## 2019-05-28 MED ORDER — IOHEXOL 300 MG/ML  SOLN
75.0000 mL | Freq: Once | INTRAMUSCULAR | Status: AC | PRN
  Administered 2019-05-28: 13:00:00 75 mL via INTRAVENOUS

## 2019-05-28 MED ORDER — ONDANSETRON HCL 4 MG/2ML IJ SOLN: 4.0000 mg | Freq: Four times a day (QID) | INTRAMUSCULAR | Status: DC | PRN

## 2019-05-28 MED ORDER — ACETAMINOPHEN 325 MG PO TABS: 650.0000 mg | ORAL_TABLET | Freq: Four times a day (QID) | ORAL | Status: DC | PRN

## 2019-05-28 NOTE — ED Provider Notes (Signed)
[facility] DEPT Provider Note   CSN: [id number] Arrival date & time: 05/28/19  0935     History   Chief Complaint Chief Complaint  Patient presents with   Fall   Shoulder Pain    HPI Jenny Torres is a 85 y.o. female with PMHx osteoporosis, HTN, HLD, GERD, COPD who presents to the ED via EMS from [facility] for fall that occurred last night.  Patient reports that she got up to go to the bathroom and fell landing onto the floor around 1 AM.  Patient states that she was not found until 9 AM this morning and continued to lay on the floor during that time.  She denies hitting her head or losing consciousness.  States that her left shoulder hurts but otherwise has no complaints at this time.  She is not anticoagulated. She has not taken anything for pain. Reports history of multiple fractures in the past including her left humeral head.        Past Medical History:  Diagnosis Date   Anorexia    Anxiety    Carotid artery occlusion    COPD (chronic obstructive pulmonary disease) (HCC)    Depression    Diverticula, colon    Gastritis    GERD (gastroesophageal reflux disease)    Hemorrhoids, external    HTN (hypertension)    Hyperlipidemia    Hyperlipidemia    Idiopathic peripheral neuropathy    chronic   MVP (mitral valve prolapse)    stable   Osteoporosis    Palpitations    Polyp of colon    Raynaud's disease     Patient Active Problem List   Diagnosis Date Noted   Multiple fractures of ribs, left side, initial encounter for closed fracture 05/28/2019   Closed displaced fracture of left clavicle 05/28/2019   Closed left hip fracture ([place]) 05/28/2019   Multiple rib fractures 05/28/2019   Multiple fractures 04/23/2019   Pubic ramus fracture ([place]) 04/21/2019   Routine general medical examination at a health care facility 04/12/2018   Venous insufficiency 12/17/2017   Physical  deconditioning    Dysuria 09/08/2016   Multiple closed pelvic fractures without disruption of pelvic circle ([place]) 05/08/2015   Syncope and collapse 12/02/2014   Hyponatremia 12/02/2014   Hyperlipidemia 12/02/2014   COPD (chronic obstructive pulmonary disease) ([place]) 12/02/2014   Essential hypertension 12/02/2014   Insomnia 02/18/2014   Protein-calorie malnutrition, severe ([place]) 02/12/2014   Low vitamin B12 level 12/29/2012   Pulmonary nodule 10/21/2012   Idiopathic peripheral neuropathy    MVP (mitral valve prolapse)    Osteoporosis    Raynaud's disease 04/24/2011   Anxiety state 11/10/2007   Depression 11/10/2007    Past Surgical History:  Procedure Laterality Date   CESAREAN SECTION     x2   CHOLECYSTECTOMY     INTRAMEDULLARY (IM) NAIL INTERTROCHANTERIC Right 02/11/2014     OB History   No obstetric history on file.      Home Medications    Prior to Admission medications   Medication Sig Start Date End Date Taking? Authorizing Provider  zolpidem (AMBIEN) 5 MG tablet TAKE ONE TABLET AT BEDTIME AS NEEDED FOR SLEEP Patient taking differently: Take 5 mg by mouth at bedtime as needed for sleep.  03/21/19  Yes Hoyt Koch, MD  feeding supplement, ENSURE ENLIVE, (ENSURE ENLIVE) LIQD Take 237 mLs by mouth 2 (two) times daily between meals. Patient not taking: Reported on 05/28/2019 04/25/19  Donne Hazel, MD  metoprolol tartrate (LOPRESSOR) 25 MG tablet Take 0.5 tablets (12.5 mg total) by mouth 2 (two) times daily. Patient not taking: Reported on 05/28/2019 01/24/18   Hoyt Koch, MD  ondansetron (ZOFRAN ODT) 4 MG disintegrating tablet Take 1 tablet (4 mg total) by mouth every 4 (four) hours as needed for nausea or vomiting. Patient not taking: Reported on 05/28/2019 04/12/18   Hoyt Koch, MD  oxyCODONE (OXY IR/ROXICODONE) 5 MG immediate release tablet Take 1-2 tablets (5-10 mg total) by mouth every 4 (four) hours as needed for  breakthrough pain ((for MODERATE breakthrough pain)). Patient not taking: Reported on 05/28/2019 04/25/19   Donne Hazel, MD  sertraline (ZOLOFT) 50 MG tablet TAKE 1 TABLET DAILY Patient not taking: No sig reported 12/23/18   Hoyt Koch, MD    Family History Family History  Problem Relation Age of Onset   Breast cancer Mother    Liver disease Brother    Cirrhosis Brother     Social History Social History   Tobacco Use   Smoking status: Former Smoker    Packs/day: 1.00    Years: 25.00    Pack years: 25.00    Types: Cigarettes    Quit date: 09/22/1983    Years since quitting: 35.7   Smokeless tobacco: Never Used  Substance Use Topics   Alcohol use: Yes    Comment: rarely   Drug use: No     Allergies   Lovastatin, Sulfamethoxazole, Darvocet [propoxyphene n-acetaminophen], Erythromycin, Hydrocod polst-cpm polst er, Penicillins, Theophyllines, Zovirax [acyclovir], Ceftin, and Paroxetine hcl   Review of Systems Review of Systems  Constitutional: Negative for fever.  HENT: Negative for congestion.   Eyes: Negative for visual disturbance.  Respiratory: Negative for shortness of breath.   Cardiovascular: Negative for chest pain.  Gastrointestinal: Negative for abdominal pain.  Genitourinary: Negative for difficulty urinating.  Musculoskeletal: Positive for arthralgias.  Skin: Negative for rash.  Neurological: Negative for syncope.     Physical Exam Updated Vital Signs BP (!) 173/88    Pulse 94    Temp 97.6 F (36.4 C) (Oral)    Resp 18    SpO2 99%   Physical Exam Vitals signs and nursing note reviewed.  Constitutional:      Appearance: She is not ill-appearing.     Comments: Thin, elderly female  HENT:     Head: Normocephalic and atraumatic.     Comments: No raccoon's sign or battle's sign. Negative hemotympanum bilaterally.     Right Ear: Tympanic membrane normal.     Left Ear: Tympanic membrane normal.  Eyes:     Conjunctiva/sclera:  Conjunctivae normal.  Neck:     Musculoskeletal: Neck supple.  Cardiovascular:     Rate and Rhythm: Normal rate and regular rhythm.     Pulses: Normal pulses.  Pulmonary:     Effort: Pulmonary effort is normal.     Breath sounds: Normal breath sounds. No wheezing, rhonchi or rales.  Abdominal:     Palpations: Abdomen is soft.     Tenderness: There is no guarding or rebound.  Musculoskeletal:     Comments: Obvious ecchymosis to left shoulder and left distal clavicle. Crepitus appreciated to left clavicle. Tenderness to area. ROM limited due to pain. Strength 5/5 with grip strength. Sensation intact throughout. Good distal pulses.   No C, T, or L midline spinal tenderness. No paraspinal muscle tenderness.   No initial tenderness to palpation to left hip or left knee.  With ROM of LLE patient endorses pain to both. Strength and sensation intact to BLEs. Good distal pulses. No tenderness to all other joints.   Skin:    General: Skin is warm and dry.  Neurological:     Mental Status: She is alert.      ED Treatments / Results  Labs (all labs ordered are listed, but only abnormal results are displayed) Labs Reviewed  COMPREHENSIVE METABOLIC PANEL - Abnormal; Notable for the following components:      Result Value   Sodium 130 (*)    Chloride 93 (*)    Glucose, Bld 119 (*)    Creatinine, Ser 0.43 (*)    Alkaline Phosphatase 220 (*)    All other components within normal limits  CBC WITH DIFFERENTIAL/PLATELET - Abnormal; Notable for the following components:   WBC 17.9 (*)    Neutro Abs 16.1 (*)    Lymphs Abs 0.6 (*)    Abs Immature Granulocytes 0.13 (*)    All other components within normal limits  URINALYSIS, ROUTINE W REFLEX MICROSCOPIC - Abnormal; Notable for the following components:   APPearance HAZY (*)    Glucose, UA 50 (*)    All other components within normal limits  SARS CORONAVIRUS 2 (HOSPITAL ORDER, [place])  CK    EKG EKG  Interpretation  Date/Time:  Sunday May 28 2019 10:53:49 EDT Ventricular Rate:  104 PR Interval:    QRS Duration: 107 QT Interval:  386 QTC Calculation: 508 R Axis:   42 Text Interpretation:  Sinus tachycardia Right atrial enlargement Low voltage, precordial leads Prolonged QT interval Since last tracing rate faster Confirmed by Daleen Bo [id number]) on 05/28/2019 1:17:03 PM   Radiology Dg Clavicle Left  Result Date: 05/28/2019 CLINICAL DATA:  Fall with bruising over the left shoulder and clavicle. EXAM: LEFT CLAVICLE - 2+ VIEWS COMPARISON:  Left shoulder 05/28/2019 and chest radiograph 04/21/2019 FINDINGS: Segmental fracture involving the left clavicle. There is a fracture in the mid left clavicle with the lateral segment displaced superiorly. There is a second fracture along the distal or lateral aspect of the left clavicle. Chronic thickening and calcifications at the left lung apex. Atherosclerotic calcifications at the aortic arch. No evidence for a large left pneumothorax. Evidence for fractures involving the left third rib. Displaced fracture involving the left fourth rib. Question a fracture of the left fifth rib. IMPRESSION: 1. Segmental left clavicle fracture. 2. Left rib fractures. Electronically Signed   By: Markus Daft M.D.   On: 05/28/2019 10:58   Ct Head Wo Contrast  Result Date: 05/28/2019 CLINICAL DATA:  Per EMS, pt from [facility] assisted living.for fall last night. Pt has bruising and swelling to left shoulder, abrasions to left knee. Pt laid on floor from 1AM to 9AM. Pt denies loss of consciousness, did not hit head, is not on blood thinners. EXAM: CT HEAD WITHOUT CONTRAST CT CERVICAL SPINE WITHOUT CONTRAST TECHNIQUE: Multidetector CT imaging of the head and cervical spine was performed following the standard protocol without intravenous contrast. Multiplanar CT image reconstructions of the cervical spine were also generated. COMPARISON:  09/14/2016 CT head FINDINGS: CT  HEAD FINDINGS Brain: There is central and cortical atrophy. Periventricular white matter changes are consistent with small vessel disease. There is no intra or extra-axial fluid collection or mass lesion. The basilar cisterns and ventricles have a normal appearance. There is no CT evidence for acute infarction or hemorrhage. Vascular: There is minimal atherosclerotic calcification of the  internal carotid arteries. No hyperdense vessels. Skull: No calvarial fracture. LEFT parietal scalp edema/hematoma. Sinuses/Orbits: Minimal mucosal thickening of the LEFT maxillary sinus. No acute fracture. Other: None CT CERVICAL SPINE FINDINGS Alignment: There is loss of cervical lordosis. This may be secondary to splinting, soft tissue injury, or positioning. Otherwise alignment is normal. Skull base and vertebrae: No acute fracture. No primary bone lesion or focal pathologic process. Soft tissues and spinal canal: No prevertebral fluid or swelling. No visible canal hematoma. Disc levels: Disc height loss particularly at C5-6, C6-7, and C7-T1. Upper chest: There is biapical pleuroparenchymal change. Acute fractures of posterior LEFT ribs 1-4. Acute fracture of the LEFT clavicle. No pneumothorax at the apex. Other: None IMPRESSION: 1. Atrophy and small vessel disease. 2. No evidence for acute intracranial abnormality. 3. LEFT parietal scalp edema/hematoma.  No underlying fracture. 4. No evidence for acute cervical spine abnormality. 5. Acute fractures of the posterior LEFT ribs 1-4. Consider CT of the chest with intravenous contrast. 6. Acute fracture of the LEFT clavicle. These results were called by telephone at the time of interpretation on 05/28/2019 at 11:01 am to provider Terria Deschepper , who verbally acknowledged these results. Electronically Signed   By: Nolon Nations M.D.   On: 05/28/2019 11:05   Ct Chest W Contrast  Result Date: 05/28/2019 CLINICAL DATA:  85 year old with fall and left clavicle rib fractures.  Left-sided pain. EXAM: CT CHEST WITH CONTRAST TECHNIQUE: Multidetector CT imaging of the chest was performed during intravenous contrast administration. CONTRAST:  14mL OMNIPAQUE IOHEXOL 300 MG/ML  SOLN COMPARISON:  Chest CT 10/06/2012 FINDINGS: Cardiovascular: Normal caliber of the thoracic aorta. No evidence for an aortic dissection. Atherosclerotic calcifications in the aorta. Pulmonary arteries are patent. Mediastinum/Nodes: Calcifications in the mediastinum compatible with old granulomatous disease. No enlarged chest lymph nodes. 5 mm low-density in the left thyroid lobe is likely an incidental finding in a patient of this age. No axillary lymph node enlargement. Lungs/Pleura: No pleural effusion or pneumothorax. Irregular peripheral nodules in the lungs. These findings are most prominent at the base of the right middle lobe and lingula. Some of these findings were present in the exam from 2014. Largest area of peripheral nodularity is in the lingula on sequence 7, image 82 measuring up to 1.1 cm. These patchy densities and nodularity have progressed since 2014, particularly in the left lower lobe. No significant consolidation in either lung. Upper Abdomen: Punctate low-density structure in the right kidney likely represents a cyst. No acute findings in the visualized upper abdomen. Musculoskeletal: Displaced fracture involving the mid left clavicle. Minimally displaced fracture involving the distal left clavicle. Left AC joint is intact. Left shoulder is located. Irregularity along the posterior left third rib may be chronic and question a fracture at this location. Depressed angulated fracture of the anterior left second rib. There is probably a nondisplaced fracture involving the anterior left third rib. There is a displaced fracture in the lateral left third rib. Mildly displaced fracture of the posterior left fourth rib. Additional fracture along the lateral left fourth rib. Evidence for old right  posterior rib fractures. Thoracic vertebral body heights are maintained. IMPRESSION: 1. Left clavicle fractures. 2. Acute left rib fractures involving the left second, third and fourth ribs. Old right rib fractures. Negative for pneumothorax. 3. Scattered patchy nodular densities in lungs. Findings are suggestive for an acute on chronic infectious or inflammatory process. There has been progressive of disease since 2014. Findings could represent an atypical infectious etiology such as Mycobacterium  avium intracellulare (MAI). Electronically Signed   By: Markus Daft M.D.   On: 05/28/2019 13:40   Ct Cervical Spine Wo Contrast  Result Date: 05/28/2019 CLINICAL DATA:  Per EMS, pt from [facility] assisted living.for fall last night. Pt has bruising and swelling to left shoulder, abrasions to left knee. Pt laid on floor from 1AM to 9AM. Pt denies loss of consciousness, did not hit head, is not on blood thinners. EXAM: CT HEAD WITHOUT CONTRAST CT CERVICAL SPINE WITHOUT CONTRAST TECHNIQUE: Multidetector CT imaging of the head and cervical spine was performed following the standard protocol without intravenous contrast. Multiplanar CT image reconstructions of the cervical spine were also generated. COMPARISON:  09/14/2016 CT head FINDINGS: CT HEAD FINDINGS Brain: There is central and cortical atrophy. Periventricular white matter changes are consistent with small vessel disease. There is no intra or extra-axial fluid collection or mass lesion. The basilar cisterns and ventricles have a normal appearance. There is no CT evidence for acute infarction or hemorrhage. Vascular: There is minimal atherosclerotic calcification of the internal carotid arteries. No hyperdense vessels. Skull: No calvarial fracture. LEFT parietal scalp edema/hematoma. Sinuses/Orbits: Minimal mucosal thickening of the LEFT maxillary sinus. No acute fracture. Other: None CT CERVICAL SPINE FINDINGS Alignment: There is loss of cervical lordosis. This  may be secondary to splinting, soft tissue injury, or positioning. Otherwise alignment is normal. Skull base and vertebrae: No acute fracture. No primary bone lesion or focal pathologic process. Soft tissues and spinal canal: No prevertebral fluid or swelling. No visible canal hematoma. Disc levels: Disc height loss particularly at C5-6, C6-7, and C7-T1. Upper chest: There is biapical pleuroparenchymal change. Acute fractures of posterior LEFT ribs 1-4. Acute fracture of the LEFT clavicle. No pneumothorax at the apex. Other: None IMPRESSION: 1. Atrophy and small vessel disease. 2. No evidence for acute intracranial abnormality. 3. LEFT parietal scalp edema/hematoma.  No underlying fracture. 4. No evidence for acute cervical spine abnormality. 5. Acute fractures of the posterior LEFT ribs 1-4. Consider CT of the chest with intravenous contrast. 6. Acute fracture of the LEFT clavicle. These results were called by telephone at the time of interpretation on 05/28/2019 at 11:01 am to provider Leonid Manus , who verbally acknowledged these results. Electronically Signed   By: Nolon Nations M.D.   On: 05/28/2019 11:05   Ct Hip Left Wo Contrast  Result Date: 05/28/2019 CLINICAL DATA:  Left hip pain EXAM: CT OF THE LEFT HIP WITHOUT CONTRAST TECHNIQUE: Multidetector CT imaging of the left hip was performed according to the standard protocol. Multiplanar CT image reconstructions were also generated. COMPARISON:  X-ray 05/28/2019 FINDINGS: Bones/Joint/Cartilage Acute minimally impacted subcapital fracture of the left femoral neck. No intra-articular or intertrochanteric fracture component. No significant displacement or angulation. Acute to subacute appearing fracture of the right inferior pubic ramus at site of prior healed fracture (series 2, image 63). Mild degenerative changes of the left hip joint without dislocation. Pubic symphysis is intact with chondrocalcinosis. Ligaments Suboptimally assessed by CT. Muscles and  Tendons Atrophy and fatty infiltration of the included musculature. No gross tendinous abnormality. Soft tissues Induration of the subcutaneous soft tissues posterior to the left hip. No focal hematoma or fluid collection. Moderate distention of the urinary bladder. Moderate volume of stool within the visualized colon. IMPRESSION: 1. Acute, minimally impacted subcapital fracture of the left femoral neck. 2. Acute on chronic appearing right inferior pubic ramus fracture. Electronically Signed   By: Davina Poke M.D.   On: 05/28/2019 13:19  Dg Shoulder Left  Result Date: 05/28/2019 CLINICAL DATA:  Fall with left clavicle injury EXAM: LEFT SHOULDER - 2+ VIEW COMPARISON:  None similar FINDINGS: Segmental fracture of the left clavicle involving the mid and lateral portions. The mid clavicle fracture shows apex superior angulation. Posterior left third and fourth rib fractures. More lateral rib fractures at the same levels. Rib displacement is up to moderate. No visible pneumothorax. High humeral head which may be from chronic rotator cuff arthropathy. Osteopenia. IMPRESSION: 1. Segmental clavicle fracture involving the mid and lateral portions with midportion angulation. 2. Segmental left third and fourth rib fractures. Electronically Signed   By: Monte Fantasia M.D.   On: 05/28/2019 11:04   Dg Knee Complete 4 Views Left  Result Date: 05/28/2019 CLINICAL DATA:  Left knee pain after a fall last night. Initial encounter. EXAM: LEFT KNEE - COMPLETE 4+ VIEW COMPARISON:  None. FINDINGS: No evidence of fracture, dislocation, or joint effusion. No evidence of arthropathy or other focal bone abnormality. Chondrocalcinosis noted. Soft tissues are otherwise unremarkable. IMPRESSION: No acute abnormality. Electronically Signed   By: Inge Rise M.D.   On: 05/28/2019 10:58   Dg Hip Unilat With Pelvis 2-3 Views Left  Result Date: 05/28/2019 CLINICAL DATA:  Left hip pain after a fall last night. Initial encounter.  EXAM: DG HIP (WITH OR WITHOUT PELVIS) 2-3V LEFT COMPARISON:  None. FINDINGS: The patient has an acute subcapital fracture of the left hip. No other acute bony abnormality is seen. Remote bilateral parasymphyseal pubic bone fractures are seen. The patient is status post fixation of a healed right hip fracture. No complicating feature. Right hip osteoarthritis noted. IMPRESSION: Acute subcapital fracture left hip. Remote healed right hip and bilateral pubic rami fractures. Right worse than left hip osteoarthritis. Electronically Signed   By: Inge Rise M.D.   On: 05/28/2019 10:57    Procedures Procedures (including critical care time)  Medications Ordered in ED Medications  sodium chloride (PF) 0.9 % injection (has no administration in time range)  dextrose 5 % and 0.45 % NaCl with KCl 20 mEq/L infusion (has no administration in time range)  acetaminophen (TYLENOL) tablet 650 mg (has no administration in time range)    Or  acetaminophen (TYLENOL) suppository 650 mg (has no administration in time range)  HYDROcodone-acetaminophen (NORCO/VICODIN) 5-325 MG per tablet 1-2 tablet (1 tablet Oral Given 05/28/19 1606)  HYDROmorphone (DILAUDID) injection 0.5 mg (0.5 mg Intravenous Given 05/28/19 1659)  ondansetron (ZOFRAN-ODT) disintegrating tablet 4 mg (has no administration in time range)    Or  ondansetron (ZOFRAN) injection 4 mg (has no administration in time range)  fentaNYL (SUBLIMAZE) injection 50 mcg (50 mcg Intravenous Given 05/28/19 1147)  iohexol (OMNIPAQUE) 300 MG/ML solution 75 mL (75 mLs Intravenous Contrast Given 05/28/19 1251)  vancomycin (VANCOCIN) IVPB 750 mg/150 ml premix (0 mg Intravenous Stopped 05/28/19 1702)  ceFEPIme (MAXIPIME) 2 g in sodium chloride 0.9 % 100 mL IVPB (0 g Intravenous Stopped 05/28/19 1544)  azithromycin (ZITHROMAX) 500 mg in sodium chloride 0.9 % 250 mL IVPB (500 mg Intravenous Transfusing/Transfer 05/28/19 1700)     Initial Impression / Assessment and Plan / ED  Course  I have reviewed the triage vital signs and the nursing notes.  Pertinent labs & imaging results that were available during my care of the patient were reviewed by me and considered in my medical decision making (see chart for details).  85 year old female who presents s/p fall at assisted living facility. Was down for a  total of 8 hours on ground. Denies head injury or LOC. Pt has obvious ecchymosis and crepitus to left shoulder/left clavicle. Suspect fracture today. She is not anticoagulated. No focal neuro deficits on exam. Will obtain baseline bloodwork including CK given downtown to rule out rhabdo. CT head, CT C spine, and multiple xrays also ordered.   CT Head and CT C spine clear. Pt does have multiple left rib fractures, left clavicle fracture, left hip fracture. Will consult trauma surg and ortho surg. Awaiting labwork.   Leukocytosis present at 17,000.  Waiting urine at this time.  Patient also pending CT chest per radiology recommendations today.  Will evaluate for any type of infectious process.  CT chest positive for opacities.  Findings likely consistent with atypical pneumonia.  Will start on antibiotics at this time.  To be transferred to [facility] for further evaluation by orthopedic surgeon given left hip fracture.  Admitted to trauma service.   Clinical Course as of May 28 1719  Sun May 28, 2019  1103 Received call from radiologist in regards to xrays - she appreciates left clavicular fracture and multiple rib fractures on left side. Recommends CT scan for further evaluation   [MV]  1218 Discussed case with Dr. Stann Mainland who suggests sending patient to [facility] for possible surgery - earliest tomorrow.    [MV]  1227 Discussed case with Dr. Rosendo Gros with trauma surgery - agrees with recommendation to transfer to [facility]. Suggests giving general surgery Dr. Harlow Asa a call as well as they are at [facility] and may be able to evaluate patient. Trauma surgery will admit at this time.    [MV]  [id number]  Discussed case with Dr. Harlow Asa with general surgery - agrees to evaluate patient and do H&P. Pt to be transferred to [facility] by [facility]. Trauma accepting   [MV]  1254 Received call from orthopedic surgeon Dr. Stann Mainland who recommends CT left hip; will add on at this time   [MV]  1322 Discussed case with daughter [name] who reports pt was placed on palliative care yesterday for geriatric failure to thrive. This may change course of treatment/question if patient candidate for surgery? Regardless will need to be admitted to trauma for observation    [MV]  1526 Discussed case with Dr. Cyndia Skeeters with hospitalist team who will consult on patient with trauma admission   [MV]    Clinical Course User Index [MV] Eustaquio Maize, PA-C         Final Clinical Impressions(s) / ED Diagnoses   Final diagnoses:  Fall, initial encounter  Closed nondisplaced fracture of left clavicle, unspecified part of clavicle, initial encounter  Closed fracture of multiple ribs of left side, initial encounter  Closed subcapital fracture of left femur, initial encounter [facility])    ED Discharge Orders    None       Eustaquio Maize, PA-C 05/28/19 1720    Daleen Bo, MD 05/30/19 [phone]

## 2019-05-28 NOTE — Consult Note (Signed)
ORTHOPAEDIC CONSULTATION  REQUESTING PHYSICIAN: Md, Trauma, MD  PCP:  Hoyt Koch, MD  Chief Complaint: Ground-level fall with multiple fractures  HPI: Jenny Torres is a 85 y.o. female who complains of left hip and left chest and shoulder pain following a ground-level fall.  She lives in a retirement community where she had an unwitnessed fall at about 1 AM.  She lives on the floor until 9 AM when she was discovered.  She presented to [facility] long emergency department with left hip fracture, left comminuted clavicle fracture, and 3 left rib fractures as well as CT findings on chest concerning for possible inflammatory process.  She has been recently hospitalized for pelvic fractures back in July.  She went to a skilled nursing facility and then returned back to her assisted living.  She states to me that she has bilateral lower extremity peripheral neuropathy with ray nodes for many years.  She has had multiple falls recently due to balance issues and the peripheral neuropathy.  She does not do well with her walker.  She is minimally ambulatory at baseline.  She does also have a remote history about 5 years ago right hip intertrochanteric fracture fixed with intramedullary nail.  Past Medical History:  Diagnosis Date   Anorexia    Anxiety    Carotid artery occlusion    COPD (chronic obstructive pulmonary disease) (HCC)    Depression    Diverticula, colon    Gastritis    GERD (gastroesophageal reflux disease)    Hemorrhoids, external    HTN (hypertension)    Hyperlipidemia    Hyperlipidemia    Idiopathic peripheral neuropathy    chronic   MVP (mitral valve prolapse)    stable   Osteoporosis    Palpitations    Polyp of colon    Raynaud's disease    Past Surgical History:  Procedure Laterality Date   CESAREAN SECTION     x2   CHOLECYSTECTOMY     INTRAMEDULLARY (IM) NAIL INTERTROCHANTERIC Right 02/11/2014   Social History   Socioeconomic  History   Marital status: Married    Spouse name: Not on file   Number of children: 2   Years of education: Not on file   Highest education level: Not on file  Occupational History   Occupation: caregiver    Employer: RETIRED  Social Needs   Financial resource strain: Patient refused   Food insecurity    Worry: Patient refused    Inability: Patient refused   Transportation needs    Medical: Patient refused    Non-medical: Patient refused  Tobacco Use   Smoking status: Former Smoker    Packs/day: 1.00    Years: 25.00    Pack years: 25.00    Types: Cigarettes    Quit date: 09/22/1983    Years since quitting: 35.7   Smokeless tobacco: Never Used  Substance and Sexual Activity   Alcohol use: Yes    Comment: rarely   Drug use: No   Sexual activity: Never  Lifestyle   Physical activity    Days per week: Patient refused    Minutes per session: Patient refused   Stress: Not on file  Relationships   Social connections    Talks on phone: Patient refused    Gets together: Patient refused    Attends religious service: Patient refused    Active member of club or organization: Patient refused    Attends meetings of clubs or organizations: Patient refused  Relationship status: Patient refused  Other Topics Concern   Not on file  Social History Narrative   Retired.    Family History  Problem Relation Age of Onset   Breast cancer Mother    Liver disease Brother    Cirrhosis Brother    Allergies  Allergen Reactions   Lovastatin Other (See Comments)    unknown   Sulfamethoxazole Swelling    Swelling of the throat, mouth, tongue, nose   Darvocet [Propoxyphene N-Acetaminophen] Itching   Erythromycin Nausea And Vomiting    Upset stomach   Hydrocod Polst-Cpm Polst Er Nausea Only    nausea   Penicillins Nausea And Vomiting    "CAN TOLERATE NOW" Has patient had a PCN reaction causing immediate rash, facial/tongue/throat swelling, SOB or  lightheadedness with hypotension: No Has patient had a PCN reaction causing severe rash involving mucus membranes or skin necrosis: No Has patient had a PCN reaction that required hospitalization No Has patient had a PCN reaction occurring within the last 10 years: yes If all of the above answers are "NO", then may proceed with Cephalosporin use.    Theophyllines Other (See Comments)    Unknown reaction   Zovirax [Acyclovir] Other (See Comments)    Per patient lots of side effects   Ceftin Diarrhea    GI problems   Paroxetine Hcl Other (See Comments)    jittery   Prior to Admission medications   Medication Sig Start Date End Date Taking? Authorizing Provider  zolpidem (AMBIEN) 5 MG tablet TAKE ONE TABLET AT BEDTIME AS NEEDED FOR SLEEP Patient taking differently: Take 5 mg by mouth at bedtime as needed for sleep.  03/21/19  Yes Hoyt Koch, MD  feeding supplement, ENSURE ENLIVE, (ENSURE ENLIVE) LIQD Take 237 mLs by mouth 2 (two) times daily between meals. Patient not taking: Reported on 05/28/2019 04/25/19   Donne Hazel, MD  metoprolol tartrate (LOPRESSOR) 25 MG tablet Take 0.5 tablets (12.5 mg total) by mouth 2 (two) times daily. Patient not taking: Reported on 05/28/2019 01/24/18   Hoyt Koch, MD  ondansetron (ZOFRAN ODT) 4 MG disintegrating tablet Take 1 tablet (4 mg total) by mouth every 4 (four) hours as needed for nausea or vomiting. Patient not taking: Reported on 05/28/2019 04/12/18   Hoyt Koch, MD  oxyCODONE (OXY IR/ROXICODONE) 5 MG immediate release tablet Take 1-2 tablets (5-10 mg total) by mouth every 4 (four) hours as needed for breakthrough pain ((for MODERATE breakthrough pain)). Patient not taking: Reported on 05/28/2019 04/25/19   Donne Hazel, MD  sertraline (ZOLOFT) 50 MG tablet TAKE 1 TABLET DAILY Patient not taking: No sig reported 12/23/18   Hoyt Koch, MD   Dg Clavicle Left  Result Date: 05/28/2019 CLINICAL DATA:  Fall with  bruising over the left shoulder and clavicle. EXAM: LEFT CLAVICLE - 2+ VIEWS COMPARISON:  Left shoulder 05/28/2019 and chest radiograph 04/21/2019 FINDINGS: Segmental fracture involving the left clavicle. There is a fracture in the mid left clavicle with the lateral segment displaced superiorly. There is a second fracture along the distal or lateral aspect of the left clavicle. Chronic thickening and calcifications at the left lung apex. Atherosclerotic calcifications at the aortic arch. No evidence for a large left pneumothorax. Evidence for fractures involving the left third rib. Displaced fracture involving the left fourth rib. Question a fracture of the left fifth rib. IMPRESSION: 1. Segmental left clavicle fracture. 2. Left rib fractures. Electronically Signed   By: Scherrie Gerlach.D.  On: 05/28/2019 10:58   Ct Head Wo Contrast  Result Date: 05/28/2019 CLINICAL DATA:  Per EMS, pt from [facility] assisted living.for fall last night. Pt has bruising and swelling to left shoulder, abrasions to left knee. Pt laid on floor from 1AM to 9AM. Pt denies loss of consciousness, did not hit head, is not on blood thinners. EXAM: CT HEAD WITHOUT CONTRAST CT CERVICAL SPINE WITHOUT CONTRAST TECHNIQUE: Multidetector CT imaging of the head and cervical spine was performed following the standard protocol without intravenous contrast. Multiplanar CT image reconstructions of the cervical spine were also generated. COMPARISON:  09/14/2016 CT head FINDINGS: CT HEAD FINDINGS Brain: There is central and cortical atrophy. Periventricular white matter changes are consistent with small vessel disease. There is no intra or extra-axial fluid collection or mass lesion. The basilar cisterns and ventricles have a normal appearance. There is no CT evidence for acute infarction or hemorrhage. Vascular: There is minimal atherosclerotic calcification of the internal carotid arteries. No hyperdense vessels. Skull: No calvarial fracture. LEFT  parietal scalp edema/hematoma. Sinuses/Orbits: Minimal mucosal thickening of the LEFT maxillary sinus. No acute fracture. Other: None CT CERVICAL SPINE FINDINGS Alignment: There is loss of cervical lordosis. This may be secondary to splinting, soft tissue injury, or positioning. Otherwise alignment is normal. Skull base and vertebrae: No acute fracture. No primary bone lesion or focal pathologic process. Soft tissues and spinal canal: No prevertebral fluid or swelling. No visible canal hematoma. Disc levels: Disc height loss particularly at C5-6, C6-7, and C7-T1. Upper chest: There is biapical pleuroparenchymal change. Acute fractures of posterior LEFT ribs 1-4. Acute fracture of the LEFT clavicle. No pneumothorax at the apex. Other: None IMPRESSION: 1. Atrophy and small vessel disease. 2. No evidence for acute intracranial abnormality. 3. LEFT parietal scalp edema/hematoma.  No underlying fracture. 4. No evidence for acute cervical spine abnormality. 5. Acute fractures of the posterior LEFT ribs 1-4. Consider CT of the chest with intravenous contrast. 6. Acute fracture of the LEFT clavicle. These results were called by telephone at the time of interpretation on 05/28/2019 at 11:01 am to provider MARGAUX VENTER , who verbally acknowledged these results. Electronically Signed   By: Nolon Nations M.D.   On: 05/28/2019 11:05   Ct Chest W Contrast  Result Date: 05/28/2019 CLINICAL DATA:  85 year old with fall and left clavicle rib fractures. Left-sided pain. EXAM: CT CHEST WITH CONTRAST TECHNIQUE: Multidetector CT imaging of the chest was performed during intravenous contrast administration. CONTRAST:  59mL OMNIPAQUE IOHEXOL 300 MG/ML  SOLN COMPARISON:  Chest CT 10/06/2012 FINDINGS: Cardiovascular: Normal caliber of the thoracic aorta. No evidence for an aortic dissection. Atherosclerotic calcifications in the aorta. Pulmonary arteries are patent. Mediastinum/Nodes: Calcifications in the mediastinum compatible with  old granulomatous disease. No enlarged chest lymph nodes. 5 mm low-density in the left thyroid lobe is likely an incidental finding in a patient of this age. No axillary lymph node enlargement. Lungs/Pleura: No pleural effusion or pneumothorax. Irregular peripheral nodules in the lungs. These findings are most prominent at the base of the right middle lobe and lingula. Some of these findings were present in the exam from 2014. Largest area of peripheral nodularity is in the lingula on sequence 7, image 82 measuring up to 1.1 cm. These patchy densities and nodularity have progressed since 2014, particularly in the left lower lobe. No significant consolidation in either lung. Upper Abdomen: Punctate low-density structure in the right kidney likely represents a cyst. No acute findings in the visualized upper abdomen. Musculoskeletal:  Displaced fracture involving the mid left clavicle. Minimally displaced fracture involving the distal left clavicle. Left AC joint is intact. Left shoulder is located. Irregularity along the posterior left third rib may be chronic and question a fracture at this location. Depressed angulated fracture of the anterior left second rib. There is probably a nondisplaced fracture involving the anterior left third rib. There is a displaced fracture in the lateral left third rib. Mildly displaced fracture of the posterior left fourth rib. Additional fracture along the lateral left fourth rib. Evidence for old right posterior rib fractures. Thoracic vertebral body heights are maintained. IMPRESSION: 1. Left clavicle fractures. 2. Acute left rib fractures involving the left second, third and fourth ribs. Old right rib fractures. Negative for pneumothorax. 3. Scattered patchy nodular densities in lungs. Findings are suggestive for an acute on chronic infectious or inflammatory process. There has been progressive of disease since 2014. Findings could represent an atypical infectious etiology such as  Mycobacterium avium intracellulare (MAI). Electronically Signed   By: Markus Daft M.D.   On: 05/28/2019 13:40   Ct Cervical Spine Wo Contrast  Result Date: 05/28/2019 CLINICAL DATA:  Per EMS, pt from [facility] assisted living.for fall last night. Pt has bruising and swelling to left shoulder, abrasions to left knee. Pt laid on floor from 1AM to 9AM. Pt denies loss of consciousness, did not hit head, is not on blood thinners. EXAM: CT HEAD WITHOUT CONTRAST CT CERVICAL SPINE WITHOUT CONTRAST TECHNIQUE: Multidetector CT imaging of the head and cervical spine was performed following the standard protocol without intravenous contrast. Multiplanar CT image reconstructions of the cervical spine were also generated. COMPARISON:  09/14/2016 CT head FINDINGS: CT HEAD FINDINGS Brain: There is central and cortical atrophy. Periventricular white matter changes are consistent with small vessel disease. There is no intra or extra-axial fluid collection or mass lesion. The basilar cisterns and ventricles have a normal appearance. There is no CT evidence for acute infarction or hemorrhage. Vascular: There is minimal atherosclerotic calcification of the internal carotid arteries. No hyperdense vessels. Skull: No calvarial fracture. LEFT parietal scalp edema/hematoma. Sinuses/Orbits: Minimal mucosal thickening of the LEFT maxillary sinus. No acute fracture. Other: None CT CERVICAL SPINE FINDINGS Alignment: There is loss of cervical lordosis. This may be secondary to splinting, soft tissue injury, or positioning. Otherwise alignment is normal. Skull base and vertebrae: No acute fracture. No primary bone lesion or focal pathologic process. Soft tissues and spinal canal: No prevertebral fluid or swelling. No visible canal hematoma. Disc levels: Disc height loss particularly at C5-6, C6-7, and C7-T1. Upper chest: There is biapical pleuroparenchymal change. Acute fractures of posterior LEFT ribs 1-4. Acute fracture of the LEFT  clavicle. No pneumothorax at the apex. Other: None IMPRESSION: 1. Atrophy and small vessel disease. 2. No evidence for acute intracranial abnormality. 3. LEFT parietal scalp edema/hematoma.  No underlying fracture. 4. No evidence for acute cervical spine abnormality. 5. Acute fractures of the posterior LEFT ribs 1-4. Consider CT of the chest with intravenous contrast. 6. Acute fracture of the LEFT clavicle. These results were called by telephone at the time of interpretation on 05/28/2019 at 11:01 am to provider MARGAUX VENTER , who verbally acknowledged these results. Electronically Signed   By: Nolon Nations M.D.   On: 05/28/2019 11:05   Ct Hip Left Wo Contrast  Result Date: 05/28/2019 CLINICAL DATA:  Left hip pain EXAM: CT OF THE LEFT HIP WITHOUT CONTRAST TECHNIQUE: Multidetector CT imaging of the left hip was performed according to  the standard protocol. Multiplanar CT image reconstructions were also generated. COMPARISON:  X-ray 05/28/2019 FINDINGS: Bones/Joint/Cartilage Acute minimally impacted subcapital fracture of the left femoral neck. No intra-articular or intertrochanteric fracture component. No significant displacement or angulation. Acute to subacute appearing fracture of the right inferior pubic ramus at site of prior healed fracture (series 2, image 63). Mild degenerative changes of the left hip joint without dislocation. Pubic symphysis is intact with chondrocalcinosis. Ligaments Suboptimally assessed by CT. Muscles and Tendons Atrophy and fatty infiltration of the included musculature. No gross tendinous abnormality. Soft tissues Induration of the subcutaneous soft tissues posterior to the left hip. No focal hematoma or fluid collection. Moderate distention of the urinary bladder. Moderate volume of stool within the visualized colon. IMPRESSION: 1. Acute, minimally impacted subcapital fracture of the left femoral neck. 2. Acute on chronic appearing right inferior pubic ramus fracture.  Electronically Signed   By: Davina Poke M.D.   On: 05/28/2019 13:19   Dg Shoulder Left  Result Date: 05/28/2019 CLINICAL DATA:  Fall with left clavicle injury EXAM: LEFT SHOULDER - 2+ VIEW COMPARISON:  None similar FINDINGS: Segmental fracture of the left clavicle involving the mid and lateral portions. The mid clavicle fracture shows apex superior angulation. Posterior left third and fourth rib fractures. More lateral rib fractures at the same levels. Rib displacement is up to moderate. No visible pneumothorax. High humeral head which may be from chronic rotator cuff arthropathy. Osteopenia. IMPRESSION: 1. Segmental clavicle fracture involving the mid and lateral portions with midportion angulation. 2. Segmental left third and fourth rib fractures. Electronically Signed   By: Monte Fantasia M.D.   On: 05/28/2019 11:04   Dg Knee Complete 4 Views Left  Result Date: 05/28/2019 CLINICAL DATA:  Left knee pain after a fall last night. Initial encounter. EXAM: LEFT KNEE - COMPLETE 4+ VIEW COMPARISON:  None. FINDINGS: No evidence of fracture, dislocation, or joint effusion. No evidence of arthropathy or other focal bone abnormality. Chondrocalcinosis noted. Soft tissues are otherwise unremarkable. IMPRESSION: No acute abnormality. Electronically Signed   By: Inge Rise M.D.   On: 05/28/2019 10:58   Dg Hip Unilat With Pelvis 2-3 Views Left  Result Date: 05/28/2019 CLINICAL DATA:  Left hip pain after a fall last night. Initial encounter. EXAM: DG HIP (WITH OR WITHOUT PELVIS) 2-3V LEFT COMPARISON:  None. FINDINGS: The patient has an acute subcapital fracture of the left hip. No other acute bony abnormality is seen. Remote bilateral parasymphyseal pubic bone fractures are seen. The patient is status post fixation of a healed right hip fracture. No complicating feature. Right hip osteoarthritis noted. IMPRESSION: Acute subcapital fracture left hip. Remote healed right hip and bilateral pubic rami fractures.  Right worse than left hip osteoarthritis. Electronically Signed   By: Inge Rise M.D.   On: 05/28/2019 10:57    Positive ROS: All other systems have been reviewed and were otherwise negative with the exception of those mentioned in the HPI and as above.  Physical Exam: General: Alert, no acute distress, cachectic appearing Cardiovascular: No pedal edema Respiratory: No cyanosis, no use of accessory musculature GI: No organomegaly, abdomen is soft and non-tender Skin: No lesions in the area of chief complaint Neurologic: Sensation intact distally Psychiatric: Patient is competent for consent with normal mood and affect Lymphatic: No axillary or cervical lymphadenopathy  MUSCULOSKELETAL:  Left shoulder:  Bruising noted along the acromium.  Otherwise crepitus along the clavicle with no pain.  No open wounds no tenting of the skin.  Distally in the arm she is neurovascular intact.  Left hip:  Mild tenderness on the lateral trochanter palpation.  Otherwise some pain with logroll.  Decreased sensation at the knee which is symmetric with the right down to the foot.  2+ pulses.  Motor is intact.  Assessment: 1.  Comminuted left clavicle fracture, closed 2.  Valgus impacted left femoral neck fracture  Plan: 1.  For the clavicle we discussed nonoperative management with sling for comfort only.  If she prefers to be out of the sling that is fine.  I would prefer nonweightbearing other than when up with therapy she can weight-bear on a walker if need be.  2.  The management of the hip fracture is a bit more complicated.  I had a lengthy discussion with the patient as well as her daughter, Jenny Torres over the phone, regarding the merits of surgical management versus nonoperative management.  In general, we do proceed with surgical fixation for decreased perioperative mortality and morbidity as well as pain control.  She is not currently having much pain control issues with the hip and is  interested in nonoperative treatment.  She is also interested in having the palliative care doctor see her while here in the hospital.  I do think she is high risk due to the multiple fractures and recent history of multiple falls and fractures of fragility with poor overall health.  That being said, if she is interested in surgical fixation we are happy to provide that service.  We will check back with her tomorrow.  She states that she would like to think this over with her daughter over the night.    Nicholes Stairs, MD Cell [phone]    05/28/2019 6:42 PM

## 2019-05-28 NOTE — H&P (Signed)
Jenny Torres is an 85 y.o. female.    [facility]  Chief Complaint: fall on level ground, multiple rib fractures, clavicle fracture, hip fracture  HPI: Patient is an 85 year old female resident of [facility].  Patient apparently had a fall in her apartment during the night on level ground.  She was unable to get up.  She was discovered this morning and transported by EMS to the emergency department for evaluation.  Patient was found to have evidence of multiple left rib fractures, ribs 2 3 and 4.  She did not have a pneumothorax.  Patient also has a comminuted left clavicular fracture.  Further imaging demonstrated a subcapital left hip fracture.  There is evidence on scans of recent pelvic fractures.  There are patchy infiltrates in the lung likely representing an ongoing inflammatory process.  Patient was evaluated by the emergency department staff at [facility].  They contacted the trauma service at [facility] as well as the orthopedic surgical service and the hospitalist.  Patient is now seen by general surgery in order to prepare for transfer to the trauma service at [facility].  Patient's daughter is at the bedside.  She explains that the patient has recently been evaluated by palliative care.  She is being evaluated for hospice programs.  They are unsure as to how aggressive they wish to be with management of the patient's fractures.  I have asked them to discuss this further with orthopedic surgery when they are evaluated by the orthopedic surgeon.  Past Medical History:  Diagnosis Date  . Anorexia   . Anxiety   . Carotid artery occlusion   . COPD (chronic obstructive pulmonary disease) ([place])   . Depression   . Diverticula, colon   . Gastritis   . GERD (gastroesophageal reflux disease)   . Hemorrhoids, external   . HTN (hypertension)   . Hyperlipidemia   . Hyperlipidemia   . Idiopathic peripheral neuropathy     chronic  . MVP (mitral valve prolapse)    stable  . Osteoporosis   . Palpitations   . Polyp of colon   . Raynaud's disease     Past Surgical History:  Procedure Laterality Date  . CESAREAN SECTION     x2  . CHOLECYSTECTOMY    . INTRAMEDULLARY (IM) NAIL INTERTROCHANTERIC Right 02/11/2014    Family History  Problem Relation Age of Onset  . Breast cancer Mother   . Liver disease Brother   . Cirrhosis Brother    Social History:  reports that she quit smoking about 35 years ago. Her smoking use included cigarettes. She has a 25.00 pack-year smoking history. She has never used smokeless tobacco. She reports current alcohol use. She reports that she does not use drugs.  Allergies:  Allergies  Allergen Reactions  . Lovastatin Other (See Comments)    unknown  . Sulfamethoxazole Swelling    Swelling of the throat, mouth, tongue, nose  . Darvocet [Propoxyphene N-Acetaminophen] Itching  . Erythromycin Nausea And Vomiting    Upset stomach  . Hydrocod Polst-Cpm Polst Er Nausea Only    nausea  . Penicillins Nausea And Vomiting    "CAN TOLERATE NOW" Has patient had a PCN reaction causing immediate rash, facial/tongue/throat swelling, SOB or lightheadedness with hypotension: No Has patient had a PCN reaction causing severe rash involving mucus membranes or skin necrosis: No Has patient had a PCN reaction that required hospitalization No Has patient had a PCN  reaction occurring within the last 10 years: yes If all of the above answers are "NO", then may proceed with Cephalosporin use.   . Theophyllines Other (See Comments)    Unknown reaction  . Zovirax [Acyclovir] Other (See Comments)    Per patient lots of side effects  . Ceftin Diarrhea    GI problems  . Paroxetine Hcl Other (See Comments)    jittery    (Not in a hospital admission)   Results for orders placed or performed during the hospital encounter of 05/28/19 (from the past 48 hour(s))  Comprehensive metabolic panel      Status: Abnormal   Collection Time: 05/28/19 10:12 AM  Result Value Ref Range   Sodium 130 (L) 135 - 145 mmol/L   Potassium 4.1 3.5 - 5.1 mmol/L   Chloride 93 (L) 98 - 111 mmol/L   CO2 26 22 - 32 mmol/L   Glucose, Bld 119 (H) 70 - 99 mg/dL   BUN 9 8 - 23 mg/dL   Creatinine, Ser 0.43 (L) 0.44 - 1.00 mg/dL   Calcium 9.1 8.9 - 10.3 mg/dL   Total Protein 7.2 6.5 - 8.1 g/dL   Albumin 3.8 3.5 - 5.0 g/dL   AST 32 15 - 41 U/L   ALT 23 0 - 44 U/L   Alkaline Phosphatase 220 (H) 38 - 126 U/L   Total Bilirubin 0.9 0.3 - 1.2 mg/dL   GFR calc non Af Amer >60 >60 mL/min   GFR calc Af Amer >60 >60 mL/min   Anion gap 11 5 - 15    Comment: Performed at [facility], [place] [address], [place], [place] [address]  CBC with Differential     Status: Abnormal   Collection Time: 05/28/19 10:12 AM  Result Value Ref Range   WBC 17.9 (H) 4.0 - 10.5 K/uL   RBC 4.23 3.87 - 5.11 MIL/uL   Hemoglobin 12.3 12.0 - 15.0 g/dL   HCT 37.4 36.0 - 46.0 %   MCV 88.4 80.0 - 100.0 fL   MCH 29.1 26.0 - 34.0 pg   MCHC 32.9 30.0 - 36.0 g/dL   RDW 12.8 11.5 - 15.5 %   Platelets 345 150 - 400 K/uL   nRBC 0.0 0.0 - 0.2 %   Neutrophils Relative % 90 %   Neutro Abs 16.1 (H) 1.7 - 7.7 K/uL   Lymphocytes Relative 3 %   Lymphs Abs 0.6 (L) 0.7 - 4.0 K/uL   Monocytes Relative 6 %   Monocytes Absolute 1.0 0.1 - 1.0 K/uL   Eosinophils Relative 0 %   Eosinophils Absolute 0.0 0.0 - 0.5 K/uL   Basophils Relative 0 %   Basophils Absolute 0.0 0.0 - 0.1 K/uL   Immature Granulocytes 1 %   Abs Immature Granulocytes 0.13 (H) 0.00 - 0.07 K/uL    Comment: Performed at [facility], [place] [address] [place], [place] [address]  CK     Status: None   Collection Time: 05/28/19 10:12 AM  Result Value Ref Range   Total CK 200 38 - 234 U/L    Comment: Performed at [facility], [place] [address] [place], [place] [address]  Urinalysis, Routine w reflex microscopic     Status: Abnormal    Collection Time: 05/28/19 10:12 AM  Result Value Ref Range   Color, Urine YELLOW YELLOW   APPearance HAZY (A) CLEAR   Specific Gravity, Urine 1.011 1.005 - 1.030   pH 8.0 5.0 - 8.0  Glucose, UA 50 (A) NEGATIVE mg/dL   Hgb urine dipstick NEGATIVE NEGATIVE   Bilirubin Urine NEGATIVE NEGATIVE   Ketones, ur NEGATIVE NEGATIVE mg/dL   Protein, ur NEGATIVE NEGATIVE mg/dL   Nitrite NEGATIVE NEGATIVE   Leukocytes,Ua NEGATIVE NEGATIVE    Comment: Performed at [facility], [place] [address], [place], [place] [address]  SARS Coronavirus 2 [facility] order, Performed in [facility] hospital lab) Nasopharyngeal Nasopharyngeal Swab     Status: None   Collection Time: 05/28/19 11:55 AM   Specimen: Nasopharyngeal Swab  Result Value Ref Range   SARS Coronavirus 2 NEGATIVE NEGATIVE    Comment: (NOTE) If result is NEGATIVE SARS-CoV-2 target nucleic acids are NOT DETECTED. The SARS-CoV-2 RNA is generally detectable in upper and lower  respiratory specimens during the acute phase of infection. The lowest  concentration of SARS-CoV-2 viral copies this assay can detect is 250  copies / mL. A negative result does not preclude SARS-CoV-2 infection  and should not be used as the sole basis for treatment or other  patient management decisions.  A negative result may occur with  improper specimen collection / handling, submission of specimen other  than nasopharyngeal swab, presence of viral mutation(s) within the  areas targeted by this assay, and inadequate number of viral copies  (<250 copies / mL). A negative result must be combined with clinical  observations, patient history, and epidemiological information. If result is POSITIVE SARS-CoV-2 target nucleic acids are DETECTED. The SARS-CoV-2 RNA is generally detectable in upper and lower  respiratory specimens dur ing the acute phase of infection.  Positive  results are indicative of active infection with SARS-CoV-2.  Clinical   correlation with patient history and other diagnostic information is  necessary to determine patient infection status.  Positive results do  not rule out bacterial infection or co-infection with other viruses. If result is PRESUMPTIVE POSTIVE SARS-CoV-2 nucleic acids MAY BE PRESENT.   A presumptive positive result was obtained on the submitted specimen  and confirmed on repeat testing.  While 2019 novel coronavirus  (SARS-CoV-2) nucleic acids may be present in the submitted sample  additional confirmatory testing may be necessary for epidemiological  and / or clinical management purposes  to differentiate between  SARS-CoV-2 and other Sarbecovirus currently known to infect humans.  If clinically indicated additional testing with an alternate test  methodology [phone]) is advised. The SARS-CoV-2 RNA is generally  detectable in upper and lower respiratory sp ecimens during the acute  phase of infection. The expected result is Negative. Fact Sheet for Patients:  StrictlyIdeas.no Fact Sheet for Healthcare Providers: BankingDealers.co.za This test is not yet approved or cleared by the [place] FDA and has been authorized for detection and/or diagnosis of SARS-CoV-2 by FDA under an Emergency Use Authorization (EUA).  This EUA will remain in effect (meaning this test can be used) for the duration of the COVID-19 declaration under Section 564(b)(1) of the Act, 21 U.S.C. section 360bbb-3(b)(1), unless the authorization is terminated or revoked sooner. Performed at [facility], [place] [address], [place], [place] [address]    Dg Clavicle Left  Result Date: 05/28/2019 CLINICAL DATA:  Fall with bruising over the left shoulder and clavicle. EXAM: LEFT CLAVICLE - 2+ VIEWS COMPARISON:  Left shoulder 05/28/2019 and chest radiograph 04/21/2019 FINDINGS: Segmental fracture involving the left clavicle. There is a fracture in the mid  left clavicle with the lateral segment displaced superiorly. There is a second fracture along the distal or lateral aspect  of the left clavicle. Chronic thickening and calcifications at the left lung apex. Atherosclerotic calcifications at the aortic arch. No evidence for a large left pneumothorax. Evidence for fractures involving the left third rib. Displaced fracture involving the left fourth rib. Question a fracture of the left fifth rib. IMPRESSION: 1. Segmental left clavicle fracture. 2. Left rib fractures. Electronically Signed   By: Markus Daft M.D.   On: 05/28/2019 10:58   Ct Head Wo Contrast  Result Date: 05/28/2019 CLINICAL DATA:  Per EMS, pt from [facility] assisted living.for fall last night. Pt has bruising and swelling to left shoulder, abrasions to left knee. Pt laid on floor from 1AM to 9AM. Pt denies loss of consciousness, did not hit head, is not on blood thinners. EXAM: CT HEAD WITHOUT CONTRAST CT CERVICAL SPINE WITHOUT CONTRAST TECHNIQUE: Multidetector CT imaging of the head and cervical spine was performed following the standard protocol without intravenous contrast. Multiplanar CT image reconstructions of the cervical spine were also generated. COMPARISON:  09/14/2016 CT head FINDINGS: CT HEAD FINDINGS Brain: There is central and cortical atrophy. Periventricular white matter changes are consistent with small vessel disease. There is no intra or extra-axial fluid collection or mass lesion. The basilar cisterns and ventricles have a normal appearance. There is no CT evidence for acute infarction or hemorrhage. Vascular: There is minimal atherosclerotic calcification of the internal carotid arteries. No hyperdense vessels. Skull: No calvarial fracture. LEFT parietal scalp edema/hematoma. Sinuses/Orbits: Minimal mucosal thickening of the LEFT maxillary sinus. No acute fracture. Other: None CT CERVICAL SPINE FINDINGS Alignment: There is loss of cervical lordosis. This may be secondary to  splinting, soft tissue injury, or positioning. Otherwise alignment is normal. Skull base and vertebrae: No acute fracture. No primary bone lesion or focal pathologic process. Soft tissues and spinal canal: No prevertebral fluid or swelling. No visible canal hematoma. Disc levels: Disc height loss particularly at C5-6, C6-7, and C7-T1. Upper chest: There is biapical pleuroparenchymal change. Acute fractures of posterior LEFT ribs 1-4. Acute fracture of the LEFT clavicle. No pneumothorax at the apex. Other: None IMPRESSION: 1. Atrophy and small vessel disease. 2. No evidence for acute intracranial abnormality. 3. LEFT parietal scalp edema/hematoma.  No underlying fracture. 4. No evidence for acute cervical spine abnormality. 5. Acute fractures of the posterior LEFT ribs 1-4. Consider CT of the chest with intravenous contrast. 6. Acute fracture of the LEFT clavicle. These results were called by telephone at the time of interpretation on 05/28/2019 at 11:01 am to provider MARGAUX VENTER , who verbally acknowledged these results. Electronically Signed   By: Nolon Nations M.D.   On: 05/28/2019 11:05   Ct Chest W Contrast  Result Date: 05/28/2019 CLINICAL DATA:  85 year old with fall and left clavicle rib fractures. Left-sided pain. EXAM: CT CHEST WITH CONTRAST TECHNIQUE: Multidetector CT imaging of the chest was performed during intravenous contrast administration. CONTRAST:  36mL OMNIPAQUE IOHEXOL 300 MG/ML  SOLN COMPARISON:  Chest CT 10/06/2012 FINDINGS: Cardiovascular: Normal caliber of the thoracic aorta. No evidence for an aortic dissection. Atherosclerotic calcifications in the aorta. Pulmonary arteries are patent. Mediastinum/Nodes: Calcifications in the mediastinum compatible with old granulomatous disease. No enlarged chest lymph nodes. 5 mm low-density in the left thyroid lobe is likely an incidental finding in a patient of this age. No axillary lymph node enlargement. Lungs/Pleura: No pleural effusion or  pneumothorax. Irregular peripheral nodules in the lungs. These findings are most prominent at the base of the right middle lobe and lingula. Some of these  findings were present in the exam from 2014. Largest area of peripheral nodularity is in the lingula on sequence 7, image 82 measuring up to 1.1 cm. These patchy densities and nodularity have progressed since 2014, particularly in the left lower lobe. No significant consolidation in either lung. Upper Abdomen: Punctate low-density structure in the right kidney likely represents a cyst. No acute findings in the visualized upper abdomen. Musculoskeletal: Displaced fracture involving the mid left clavicle. Minimally displaced fracture involving the distal left clavicle. Left AC joint is intact. Left shoulder is located. Irregularity along the posterior left third rib may be chronic and question a fracture at this location. Depressed angulated fracture of the anterior left second rib. There is probably a nondisplaced fracture involving the anterior left third rib. There is a displaced fracture in the lateral left third rib. Mildly displaced fracture of the posterior left fourth rib. Additional fracture along the lateral left fourth rib. Evidence for old right posterior rib fractures. Thoracic vertebral body heights are maintained. IMPRESSION: 1. Left clavicle fractures. 2. Acute left rib fractures involving the left second, third and fourth ribs. Old right rib fractures. Negative for pneumothorax. 3. Scattered patchy nodular densities in lungs. Findings are suggestive for an acute on chronic infectious or inflammatory process. There has been progressive of disease since 2014. Findings could represent an atypical infectious etiology such as Mycobacterium avium intracellulare (MAI). Electronically Signed   By: Markus Daft M.D.   On: 05/28/2019 13:40   Ct Cervical Spine Wo Contrast  Result Date: 05/28/2019 CLINICAL DATA:  Per EMS, pt from [facility] assisted  living.for fall last night. Pt has bruising and swelling to left shoulder, abrasions to left knee. Pt laid on floor from 1AM to 9AM. Pt denies loss of consciousness, did not hit head, is not on blood thinners. EXAM: CT HEAD WITHOUT CONTRAST CT CERVICAL SPINE WITHOUT CONTRAST TECHNIQUE: Multidetector CT imaging of the head and cervical spine was performed following the standard protocol without intravenous contrast. Multiplanar CT image reconstructions of the cervical spine were also generated. COMPARISON:  09/14/2016 CT head FINDINGS: CT HEAD FINDINGS Brain: There is central and cortical atrophy. Periventricular white matter changes are consistent with small vessel disease. There is no intra or extra-axial fluid collection or mass lesion. The basilar cisterns and ventricles have a normal appearance. There is no CT evidence for acute infarction or hemorrhage. Vascular: There is minimal atherosclerotic calcification of the internal carotid arteries. No hyperdense vessels. Skull: No calvarial fracture. LEFT parietal scalp edema/hematoma. Sinuses/Orbits: Minimal mucosal thickening of the LEFT maxillary sinus. No acute fracture. Other: None CT CERVICAL SPINE FINDINGS Alignment: There is loss of cervical lordosis. This may be secondary to splinting, soft tissue injury, or positioning. Otherwise alignment is normal. Skull base and vertebrae: No acute fracture. No primary bone lesion or focal pathologic process. Soft tissues and spinal canal: No prevertebral fluid or swelling. No visible canal hematoma. Disc levels: Disc height loss particularly at C5-6, C6-7, and C7-T1. Upper chest: There is biapical pleuroparenchymal change. Acute fractures of posterior LEFT ribs 1-4. Acute fracture of the LEFT clavicle. No pneumothorax at the apex. Other: None IMPRESSION: 1. Atrophy and small vessel disease. 2. No evidence for acute intracranial abnormality. 3. LEFT parietal scalp edema/hematoma.  No underlying fracture. 4. No evidence  for acute cervical spine abnormality. 5. Acute fractures of the posterior LEFT ribs 1-4. Consider CT of the chest with intravenous contrast. 6. Acute fracture of the LEFT clavicle. These results were called by telephone at  the time of interpretation on 05/28/2019 at 11:01 am to provider [facility] VENTER , who verbally acknowledged these results. Electronically Signed   By: Nolon Nations M.D.   On: 05/28/2019 11:05   Ct Hip Left Wo Contrast  Result Date: 05/28/2019 CLINICAL DATA:  Left hip pain EXAM: CT OF THE LEFT HIP WITHOUT CONTRAST TECHNIQUE: Multidetector CT imaging of the left hip was performed according to the standard protocol. Multiplanar CT image reconstructions were also generated. COMPARISON:  X-ray 05/28/2019 FINDINGS: Bones/Joint/Cartilage Acute minimally impacted subcapital fracture of the left femoral neck. No intra-articular or intertrochanteric fracture component. No significant displacement or angulation. Acute to subacute appearing fracture of the right inferior pubic ramus at site of prior healed fracture (series 2, image 63). Mild degenerative changes of the left hip joint without dislocation. Pubic symphysis is intact with chondrocalcinosis. Ligaments Suboptimally assessed by CT. Muscles and Tendons Atrophy and fatty infiltration of the included musculature. No gross tendinous abnormality. Soft tissues Induration of the subcutaneous soft tissues posterior to the left hip. No focal hematoma or fluid collection. Moderate distention of the urinary bladder. Moderate volume of stool within the visualized colon. IMPRESSION: 1. Acute, minimally impacted subcapital fracture of the left femoral neck. 2. Acute on chronic appearing right inferior pubic ramus fracture. Electronically Signed   By: Davina Poke M.D.   On: 05/28/2019 13:19   Dg Shoulder Left  Result Date: 05/28/2019 CLINICAL DATA:  Fall with left clavicle injury EXAM: LEFT SHOULDER - 2+ VIEW COMPARISON:  None similar FINDINGS:  Segmental fracture of the left clavicle involving the mid and lateral portions. The mid clavicle fracture shows apex superior angulation. Posterior left third and fourth rib fractures. More lateral rib fractures at the same levels. Rib displacement is up to moderate. No visible pneumothorax. High humeral head which may be from chronic rotator cuff arthropathy. Osteopenia. IMPRESSION: 1. Segmental clavicle fracture involving the mid and lateral portions with midportion angulation. 2. Segmental left third and fourth rib fractures. Electronically Signed   By: Monte Fantasia M.D.   On: 05/28/2019 11:04   Dg Knee Complete 4 Views Left  Result Date: 05/28/2019 CLINICAL DATA:  Left knee pain after a fall last night. Initial encounter. EXAM: LEFT KNEE - COMPLETE 4+ VIEW COMPARISON:  None. FINDINGS: No evidence of fracture, dislocation, or joint effusion. No evidence of arthropathy or other focal bone abnormality. Chondrocalcinosis noted. Soft tissues are otherwise unremarkable. IMPRESSION: No acute abnormality. Electronically Signed   By: Inge Rise M.D.   On: 05/28/2019 10:58   Dg Hip Unilat With Pelvis 2-3 Views Left  Result Date: 05/28/2019 CLINICAL DATA:  Left hip pain after a fall last night. Initial encounter. EXAM: DG HIP (WITH OR WITHOUT PELVIS) 2-3V LEFT COMPARISON:  None. FINDINGS: The patient has an acute subcapital fracture of the left hip. No other acute bony abnormality is seen. Remote bilateral parasymphyseal pubic bone fractures are seen. The patient is status post fixation of a healed right hip fracture. No complicating feature. Right hip osteoarthritis noted. IMPRESSION: Acute subcapital fracture left hip. Remote healed right hip and bilateral pubic rami fractures. Right worse than left hip osteoarthritis. Electronically Signed   By: Inge Rise M.D.   On: 05/28/2019 10:57    Review of Systems  Constitutional: Negative for chills and fever.  HENT: Negative.   Eyes: Negative.    Respiratory: Negative.   Cardiovascular: Negative.   Gastrointestinal: Negative.   Genitourinary: Negative.   Musculoskeletal: Positive for back pain, falls and joint pain.  Skin:       Per patient's daughter, early development of a decubitus ulcer has been noted.  Neurological: Negative for loss of consciousness.  Endo/Heme/Allergies: Negative.   Psychiatric/Behavioral: Negative.     Blood pressure (!) 177/92, pulse (!) 104, temperature 97.6 F (36.4 C), temperature source Oral, resp. rate 20, SpO2 100 %. Physical Exam  Constitutional: She is oriented to person, place, and time. No distress.  Thin, frail appearing woman on stretcher in ER  HENT:  Head: Normocephalic and atraumatic.  Right Ear: External ear normal.  Left Ear: External ear normal.  Mouth/Throat: No oropharyngeal exudate.  Eyes: Pupils are equal, round, and reactive to light. Conjunctivae are normal. No scleral icterus.  Neck: Normal range of motion. Neck supple. No tracheal deviation present. No thyromegaly present.  Cardiovascular: Normal rate, regular rhythm and normal heart sounds.  Respiratory: Effort normal and breath sounds normal. No respiratory distress. She has no wheezes. She has no rales. She exhibits tenderness (left anterior chest wall, no crepitance).  GI: Soft. Bowel sounds are normal. She exhibits no distension. There is no abdominal tenderness.  Musculoskeletal:        General: Tenderness (left hip) and deformity (left clavicle) present.  Lymphadenopathy:    She has no cervical adenopathy.  Neurological: She is alert and oriented to person, place, and time.  Skin: Skin is warm and dry. She is not diaphoretic.  Psychiatric: She has a normal mood and affect. Her behavior is normal.     Assessment/Plan Fall on level ground Prolonged time on ground, unable to ambulate Multiple left rib fractures (2,3,4) Left clavicle fracture Left hip fracture Patchy inflammatory changes to lungs, chronic, rule  out atypical pneumonia  Patient will be admitted to the [facility] service at [facility].  Arrangements have been made for transfer via [place].  Patient will be seen in consultation by orthopedic surgery.  Patient will also be seen in consultation by the medical hospitalist.  Family requests that palliative care services be involved in decision making.  Armandina Gemma, [place] [facility]: [place], MD 05/28/2019, 3:21 PM

## 2019-05-28 NOTE — ED Provider Notes (Signed)
  Face-to-face evaluation   History: Patient at home, lives independently in a retirement community.  She fell, during the night and was unable to get up until someone found her.  Complains of pain in the left shoulder, and left pelvis region.  She is alert.  She is somewhat dejected.  Physical exam: Elderly, frail, alert and conversant.  Left shoulder, ecchymotic, swollen without deformity.  She guards against movement of the left hip secondary to pain.  No gross pelvic instability.  Medical screening examination/treatment/procedure(s) were conducted as a shared visit with non-physician practitioner(s) and myself.  I personally evaluated the patient during the encounter    Daleen Bo, MD 05/30/19 [phone]

## 2019-05-28 NOTE — Progress Notes (Signed)
A consult was received from an ED physician for vancomycin and aztreonam per pharmacy dosing.  The patient's profile has been reviewed for ht/wt/allergies/indication/available labs.   Pt has tolerated ceftriaxone, cefazolin PTA> will change aztreonam to cefepime per protocol.  A one time order has been placed for vancomycin 750 mg and cefepime 2 gm .   Further antibiotics/pharmacy consults should be ordered by admitting physician if indicated.                       Thank you,  Eudelia Bunch, Pharm.D [phone] 05/28/2019 2:06 PM

## 2019-05-28 NOTE — Consult Note (Signed)
Medical Consultation   Jenny Torres  [id number]  DOB: 08/25/1928  DOA: 05/28/2019  PCP: Hoyt Koch, MD   Outpatient Specialists: None   Requesting physician: Armandina Gemma, MD  Reason for consultation: Atypical lung infection and other chronic medical conditions.  History of Present Illness: Jenny Torres is an 85 y.o. female with history of COPD not on oxygen, rainouts disease, anxiety, depression, idiopathic peripheral neuropathy, recurrent fall, HTN and severe protein calorie malnutrition Presenting from independent living facility after mechanical fall and multiple fractures.  History provided by patient and patient's daughter at bedside.  Patient was discharged from SNF to independent living facility about 2 weeks ago.  Normally ambulates using walker.  She has helped twice a day and as needed at independent living facility.   She got up to go to bathroom about 1 AM last night and she fell on the floor on her left side.  She thinks she fell due to hip by neuropathy.  She states she had no sensation in her feet.  She states she normally falls on the right side most of the time.  She says she was not able to get up and spend the whole night lying on the ground up until 9 AM this morning.  She denies hitting her head or loss of consciousness.  She denies any symptoms of prodromes leading to this.  She denies URI symptoms, fever, chills, cough, shortness of breath, chest pain, palpitation, dizziness, nausea, vomiting, abdominal pain or UTI symptoms.  She admits poor appetite although she tries to gain weight.  She denies focal neuro symptoms other than a chronic neuropathy in both feet.  Daughter states that her mother is very stubborn.   Patient denies smoking cigarettes, drinking alcohol recreational drug use.  Patient and patient's daughter confirms that patient is DNR/DNI.  In ED, hypertensive to 173/88 but improved to 132/71.  Sodium 130 (chronic).   Chloride 93.  Alkaline phosphatase 220.  WBC 18.  Otherwise, CBC and CMP not impressive.  CK 200.  COVID-19 negative.  UA not impressive.  Imaging revealed no acute intracranial or cervical spine finding but segmental left clavicular fracture, acute left rib fractures involving left second through fourth, old right rib fracture, scattered patchy nodular densities in the lungs concerning for acute on chronic atypical infection, acute, minimally impacted subcapital fracture of left femoral neck and acute on chronic appearing right inferior pubic ramus fracture.   Trauma surgery and orthopedic surgery consulted by EDP.  Patient was admitted by trauma surgery.  Received vancomycin, cefepime and azithromycin in ED.  Per ED PA, trauma surgery requested hospitalist consult for management of her medical conditions.   ROS All review of system negative except for pertinent positives and negatives as history of present illness above.  Past Medical History: Past Medical History:  Diagnosis Date   Anorexia    Anxiety    Carotid artery occlusion    COPD (chronic obstructive pulmonary disease) (HCC)    Depression    Diverticula, colon    Gastritis    GERD (gastroesophageal reflux disease)    Hemorrhoids, external    HTN (hypertension)    Hyperlipidemia    Hyperlipidemia    Idiopathic peripheral neuropathy    chronic   MVP (mitral valve prolapse)    stable   Osteoporosis    Palpitations    Polyp of colon    Raynaud's disease  Past Surgical History: Past Surgical History:  Procedure Laterality Date   CESAREAN SECTION     x2   CHOLECYSTECTOMY     INTRAMEDULLARY (IM) NAIL INTERTROCHANTERIC Right 02/11/2014     Allergies:   Allergies  Allergen Reactions   Lovastatin Other (See Comments)    unknown   Sulfamethoxazole Swelling    Swelling of the throat, mouth, tongue, nose   Darvocet [Propoxyphene N-Acetaminophen] Itching   Erythromycin Nausea And Vomiting     Upset stomach   Hydrocod Polst-Cpm Polst Er Nausea Only    nausea   Penicillins Nausea And Vomiting    "CAN TOLERATE NOW" Has patient had a PCN reaction causing immediate rash, facial/tongue/throat swelling, SOB or lightheadedness with hypotension: No Has patient had a PCN reaction causing severe rash involving mucus membranes or skin necrosis: No Has patient had a PCN reaction that required hospitalization No Has patient had a PCN reaction occurring within the last 10 years: yes If all of the above answers are "NO", then may proceed with Cephalosporin use.    Theophyllines Other (See Comments)    Unknown reaction   Zovirax [Acyclovir] Other (See Comments)    Per patient lots of side effects   Ceftin Diarrhea    GI problems   Paroxetine Hcl Other (See Comments)    jittery     Social History:  reports that she quit smoking about 35 years ago. Her smoking use included cigarettes. She has a 25.00 pack-year smoking history. She has never used smokeless tobacco. She reports current alcohol use. She reports that she does not use drugs.   Family History: Family History  Problem Relation Age of Onset   Breast cancer Mother    Liver disease Brother    Cirrhosis Brother     Physical Exam: Vitals:   05/28/19 0953 05/28/19 1245 05/28/19 1634 05/28/19 1755  BP: (!) 173/88 (!) 177/92 (!) 142/94 132/71  Pulse: 94 (!) 104 (!) 109 100  Resp: 18 20 20 18   Temp: 97.6 F (36.4 C)   98.5 F (36.9 C)  TempSrc: Oral   Oral  SpO2: 99% 100% 98% 98%  Weight:    33.4 kg  Height:    5\' 4"  (1.626 m)    Constitutional -frail looking elderly female. Eyes - vision grossly intact. Sclera anicteric. PERRL.  Nose - no gross deformity or drainage Mouth - no oral lesions noted Throat - no swelling or erythema Ear-hard hearing.  No obvious drainage or bleeding. Endo - no obvious thyromegaly CV -RRR. (+)S1S2, no murmurs; no JVD or peripheral edema Resp - No increased work of breathing,  good aeration bilaterally, no wheeze or crackles GI - (+)BS, soft, non-tender, non-distended MSK -frail.  Significant muscle mass wasting.  Normal ROM in her neck.  Tenderness over left shoulder, left distal clavicle, left hip and left anterior chest wall proximally.  No crepitus. Skin -pressure injury over tailbone per daughter. Neuro - alert, aware, oriented to person/place/time. No gross deficit  Psych - calm, normal mood and affect   Data reviewed:  I have personally reviewed following labs and imaging studies Labs:  CBC: Recent Labs  Lab 05/28/19 1012  WBC 17.9*  NEUTROABS 16.1*  HGB 12.3  HCT 37.4  MCV 88.4  PLT 123456    Basic Metabolic Panel: Recent Labs  Lab 05/28/19 1012  NA 130*  K 4.1  CL 93*  CO2 26  GLUCOSE 119*  BUN 9  CREATININE 0.43*  CALCIUM 9.1   GFR Estimated  Creatinine Clearance: 25.1 mL/min (A) (by C-G formula based on SCr of 0.43 mg/dL (L)). Liver Function Tests: Recent Labs  Lab 05/28/19 1012  AST 32  ALT 23  ALKPHOS 220*  BILITOT 0.9  PROT 7.2  ALBUMIN 3.8   No results for input(s): LIPASE, AMYLASE in the last 168 hours. No results for input(s): AMMONIA in the last 168 hours. Coagulation profile No results for input(s): INR, PROTIME in the last 168 hours.  Cardiac Enzymes: Recent Labs  Lab 05/28/19 1012  CKTOTAL 200   BNP: Invalid input(s): POCBNP CBG: No results for input(s): GLUCAP in the last 168 hours. D-Dimer No results for input(s): DDIMER in the last 72 hours. Hgb A1c No results for input(s): HGBA1C in the last 72 hours. Lipid Profile No results for input(s): CHOL, HDL, LDLCALC, TRIG, CHOLHDL, LDLDIRECT in the last 72 hours. Thyroid function studies No results for input(s): TSH, T4TOTAL, T3FREE, THYROIDAB in the last 72 hours.  Invalid input(s): FREET3 Anemia work up No results for input(s): VITAMINB12, FOLATE, FERRITIN, TIBC, IRON, RETICCTPCT in the last 72 hours. Urinalysis    Component Value Date/Time    COLORURINE YELLOW 05/28/2019 1012   APPEARANCEUR HAZY (A) 05/28/2019 1012   LABSPEC 1.011 05/28/2019 1012   PHURINE 8.0 05/28/2019 1012   GLUCOSEU 50 (A) 05/28/2019 1012   HGBUR NEGATIVE 05/28/2019 1012   BILIRUBINUR NEGATIVE 05/28/2019 1012   BILIRUBINUR negative 04/15/2017 1507   BILIRUBINUR negative 09/08/2016 1044   KETONESUR NEGATIVE 05/28/2019 1012   PROTEINUR NEGATIVE 05/28/2019 1012   UROBILINOGEN 1.0 04/15/2017 1507   UROBILINOGEN 1.0 05/31/2015 1600   NITRITE NEGATIVE 05/28/2019 1012   LEUKOCYTESUR NEGATIVE 05/28/2019 1012     Microbiology Recent Results (from the past 240 hour(s))  SARS Coronavirus 2 [facility] order, Performed in [facility] hospital lab) Nasopharyngeal Nasopharyngeal Swab     Status: None   Collection Time: 05/28/19 11:55 AM   Specimen: Nasopharyngeal Swab  Result Value Ref Range Status   SARS Coronavirus 2 NEGATIVE NEGATIVE Final    Comment: (NOTE) If result is NEGATIVE SARS-CoV-2 target nucleic acids are NOT DETECTED. The SARS-CoV-2 RNA is generally detectable in upper and lower  respiratory specimens during the acute phase of infection. The lowest  concentration of SARS-CoV-2 viral copies this assay can detect is 250  copies / mL. A negative result does not preclude SARS-CoV-2 infection  and should not be used as the sole basis for treatment or other  patient management decisions.  A negative result may occur with  improper specimen collection / handling, submission of specimen other  than nasopharyngeal swab, presence of viral mutation(s) within the  areas targeted by this assay, and inadequate number of viral copies  (<250 copies / mL). A negative result must be combined with clinical  observations, patient history, and epidemiological information. If result is POSITIVE SARS-CoV-2 target nucleic acids are DETECTED. The SARS-CoV-2 RNA is generally detectable in upper and lower  respiratory specimens dur ing the acute phase of infection.   Positive  results are indicative of active infection with SARS-CoV-2.  Clinical  correlation with patient history and other diagnostic information is  necessary to determine patient infection status.  Positive results do  not rule out bacterial infection or co-infection with other viruses. If result is PRESUMPTIVE POSTIVE SARS-CoV-2 nucleic acids MAY BE PRESENT.   A presumptive positive result was obtained on the submitted specimen  and confirmed on repeat testing.  While 2019 novel coronavirus  (SARS-CoV-2) nucleic acids may be present in  the submitted sample  additional confirmatory testing may be necessary for epidemiological  and / or clinical management purposes  to differentiate between  SARS-CoV-2 and other Sarbecovirus currently known to infect humans.  If clinically indicated additional testing with an alternate test  methodology [phone]) is advised. The SARS-CoV-2 RNA is generally  detectable in upper and lower respiratory sp ecimens during the acute  phase of infection. The expected result is Negative. Fact Sheet for Patients:  StrictlyIdeas.no Fact Sheet for Healthcare Providers: BankingDealers.co.za This test is not yet approved or cleared by the [place] FDA and has been authorized for detection and/or diagnosis of SARS-CoV-2 by FDA under an Emergency Use Authorization (EUA).  This EUA will remain in effect (meaning this test can be used) for the duration of the COVID-19 declaration under Section 564(b)(1) of the Act, 21 U.S.C. section 360bbb-3(b)(1), unless the authorization is terminated or revoked sooner. Performed at [facility], [place] [address] [place], [place] [address]      Inpatient Medications:   Scheduled Meds:  sodium chloride (PF)       Continuous Infusions:  dextrose 5 % and 0.45 % NaCl with KCl 20 mEq/L       Radiological Exams on Admission: Dg Clavicle Left  Result  Date: 05/28/2019 CLINICAL DATA:  Fall with bruising over the left shoulder and clavicle. EXAM: LEFT CLAVICLE - 2+ VIEWS COMPARISON:  Left shoulder 05/28/2019 and chest radiograph 04/21/2019 FINDINGS: Segmental fracture involving the left clavicle. There is a fracture in the mid left clavicle with the lateral segment displaced superiorly. There is a second fracture along the distal or lateral aspect of the left clavicle. Chronic thickening and calcifications at the left lung apex. Atherosclerotic calcifications at the aortic arch. No evidence for a large left pneumothorax. Evidence for fractures involving the left third rib. Displaced fracture involving the left fourth rib. Question a fracture of the left fifth rib. IMPRESSION: 1. Segmental left clavicle fracture. 2. Left rib fractures. Electronically Signed   By: Markus Daft M.D.   On: 05/28/2019 10:58   Ct Head Wo Contrast  Result Date: 05/28/2019 CLINICAL DATA:  Per EMS, pt from [facility] assisted living.for fall last night. Pt has bruising and swelling to left shoulder, abrasions to left knee. Pt laid on floor from 1AM to 9AM. Pt denies loss of consciousness, did not hit head, is not on blood thinners. EXAM: CT HEAD WITHOUT CONTRAST CT CERVICAL SPINE WITHOUT CONTRAST TECHNIQUE: Multidetector CT imaging of the head and cervical spine was performed following the standard protocol without intravenous contrast. Multiplanar CT image reconstructions of the cervical spine were also generated. COMPARISON:  09/14/2016 CT head FINDINGS: CT HEAD FINDINGS Brain: There is central and cortical atrophy. Periventricular white matter changes are consistent with small vessel disease. There is no intra or extra-axial fluid collection or mass lesion. The basilar cisterns and ventricles have a normal appearance. There is no CT evidence for acute infarction or hemorrhage. Vascular: There is minimal atherosclerotic calcification of the internal carotid arteries. No hyperdense  vessels. Skull: No calvarial fracture. LEFT parietal scalp edema/hematoma. Sinuses/Orbits: Minimal mucosal thickening of the LEFT maxillary sinus. No acute fracture. Other: None CT CERVICAL SPINE FINDINGS Alignment: There is loss of cervical lordosis. This may be secondary to splinting, soft tissue injury, or positioning. Otherwise alignment is normal. Skull base and vertebrae: No acute fracture. No primary bone lesion or focal pathologic process. Soft tissues and spinal canal: No prevertebral fluid or swelling. No visible canal hematoma.  Disc levels: Disc height loss particularly at C5-6, C6-7, and C7-T1. Upper chest: There is biapical pleuroparenchymal change. Acute fractures of posterior LEFT ribs 1-4. Acute fracture of the LEFT clavicle. No pneumothorax at the apex. Other: None IMPRESSION: 1. Atrophy and small vessel disease. 2. No evidence for acute intracranial abnormality. 3. LEFT parietal scalp edema/hematoma.  No underlying fracture. 4. No evidence for acute cervical spine abnormality. 5. Acute fractures of the posterior LEFT ribs 1-4. Consider CT of the chest with intravenous contrast. 6. Acute fracture of the LEFT clavicle. These results were called by telephone at the time of interpretation on 05/28/2019 at 11:01 am to provider MARGAUX VENTER , who verbally acknowledged these results. Electronically Signed   By: Nolon Nations M.D.   On: 05/28/2019 11:05   Ct Chest W Contrast  Result Date: 05/28/2019 CLINICAL DATA:  85 year old with fall and left clavicle rib fractures. Left-sided pain. EXAM: CT CHEST WITH CONTRAST TECHNIQUE: Multidetector CT imaging of the chest was performed during intravenous contrast administration. CONTRAST:  19mL OMNIPAQUE IOHEXOL 300 MG/ML  SOLN COMPARISON:  Chest CT 10/06/2012 FINDINGS: Cardiovascular: Normal caliber of the thoracic aorta. No evidence for an aortic dissection. Atherosclerotic calcifications in the aorta. Pulmonary arteries are patent. Mediastinum/Nodes:  Calcifications in the mediastinum compatible with old granulomatous disease. No enlarged chest lymph nodes. 5 mm low-density in the left thyroid lobe is likely an incidental finding in a patient of this age. No axillary lymph node enlargement. Lungs/Pleura: No pleural effusion or pneumothorax. Irregular peripheral nodules in the lungs. These findings are most prominent at the base of the right middle lobe and lingula. Some of these findings were present in the exam from 2014. Largest area of peripheral nodularity is in the lingula on sequence 7, image 82 measuring up to 1.1 cm. These patchy densities and nodularity have progressed since 2014, particularly in the left lower lobe. No significant consolidation in either lung. Upper Abdomen: Punctate low-density structure in the right kidney likely represents a cyst. No acute findings in the visualized upper abdomen. Musculoskeletal: Displaced fracture involving the mid left clavicle. Minimally displaced fracture involving the distal left clavicle. Left AC joint is intact. Left shoulder is located. Irregularity along the posterior left third rib may be chronic and question a fracture at this location. Depressed angulated fracture of the anterior left second rib. There is probably a nondisplaced fracture involving the anterior left third rib. There is a displaced fracture in the lateral left third rib. Mildly displaced fracture of the posterior left fourth rib. Additional fracture along the lateral left fourth rib. Evidence for old right posterior rib fractures. Thoracic vertebral body heights are maintained. IMPRESSION: 1. Left clavicle fractures. 2. Acute left rib fractures involving the left second, third and fourth ribs. Old right rib fractures. Negative for pneumothorax. 3. Scattered patchy nodular densities in lungs. Findings are suggestive for an acute on chronic infectious or inflammatory process. There has been progressive of disease since 2014. Findings could  represent an atypical infectious etiology such as Mycobacterium avium intracellulare (MAI). Electronically Signed   By: Markus Daft M.D.   On: 05/28/2019 13:40   Ct Cervical Spine Wo Contrast  Result Date: 05/28/2019 CLINICAL DATA:  Per EMS, pt from [facility] assisted living.for fall last night. Pt has bruising and swelling to left shoulder, abrasions to left knee. Pt laid on floor from 1AM to 9AM. Pt denies loss of consciousness, did not hit head, is not on blood thinners. EXAM: CT HEAD WITHOUT CONTRAST CT CERVICAL  SPINE WITHOUT CONTRAST TECHNIQUE: Multidetector CT imaging of the head and cervical spine was performed following the standard protocol without intravenous contrast. Multiplanar CT image reconstructions of the cervical spine were also generated. COMPARISON:  09/14/2016 CT head FINDINGS: CT HEAD FINDINGS Brain: There is central and cortical atrophy. Periventricular white matter changes are consistent with small vessel disease. There is no intra or extra-axial fluid collection or mass lesion. The basilar cisterns and ventricles have a normal appearance. There is no CT evidence for acute infarction or hemorrhage. Vascular: There is minimal atherosclerotic calcification of the internal carotid arteries. No hyperdense vessels. Skull: No calvarial fracture. LEFT parietal scalp edema/hematoma. Sinuses/Orbits: Minimal mucosal thickening of the LEFT maxillary sinus. No acute fracture. Other: None CT CERVICAL SPINE FINDINGS Alignment: There is loss of cervical lordosis. This may be secondary to splinting, soft tissue injury, or positioning. Otherwise alignment is normal. Skull base and vertebrae: No acute fracture. No primary bone lesion or focal pathologic process. Soft tissues and spinal canal: No prevertebral fluid or swelling. No visible canal hematoma. Disc levels: Disc height loss particularly at C5-6, C6-7, and C7-T1. Upper chest: There is biapical pleuroparenchymal change. Acute fractures of  posterior LEFT ribs 1-4. Acute fracture of the LEFT clavicle. No pneumothorax at the apex. Other: None IMPRESSION: 1. Atrophy and small vessel disease. 2. No evidence for acute intracranial abnormality. 3. LEFT parietal scalp edema/hematoma.  No underlying fracture. 4. No evidence for acute cervical spine abnormality. 5. Acute fractures of the posterior LEFT ribs 1-4. Consider CT of the chest with intravenous contrast. 6. Acute fracture of the LEFT clavicle. These results were called by telephone at the time of interpretation on 05/28/2019 at 11:01 am to provider MARGAUX VENTER , who verbally acknowledged these results. Electronically Signed   By: Nolon Nations M.D.   On: 05/28/2019 11:05   Ct Hip Left Wo Contrast  Result Date: 05/28/2019 CLINICAL DATA:  Left hip pain EXAM: CT OF THE LEFT HIP WITHOUT CONTRAST TECHNIQUE: Multidetector CT imaging of the left hip was performed according to the standard protocol. Multiplanar CT image reconstructions were also generated. COMPARISON:  X-ray 05/28/2019 FINDINGS: Bones/Joint/Cartilage Acute minimally impacted subcapital fracture of the left femoral neck. No intra-articular or intertrochanteric fracture component. No significant displacement or angulation. Acute to subacute appearing fracture of the right inferior pubic ramus at site of prior healed fracture (series 2, image 63). Mild degenerative changes of the left hip joint without dislocation. Pubic symphysis is intact with chondrocalcinosis. Ligaments Suboptimally assessed by CT. Muscles and Tendons Atrophy and fatty infiltration of the included musculature. No gross tendinous abnormality. Soft tissues Induration of the subcutaneous soft tissues posterior to the left hip. No focal hematoma or fluid collection. Moderate distention of the urinary bladder. Moderate volume of stool within the visualized colon. IMPRESSION: 1. Acute, minimally impacted subcapital fracture of the left femoral neck. 2. Acute on chronic  appearing right inferior pubic ramus fracture. Electronically Signed   By: Davina Poke M.D.   On: 05/28/2019 13:19   Dg Shoulder Left  Result Date: 05/28/2019 CLINICAL DATA:  Fall with left clavicle injury EXAM: LEFT SHOULDER - 2+ VIEW COMPARISON:  None similar FINDINGS: Segmental fracture of the left clavicle involving the mid and lateral portions. The mid clavicle fracture shows apex superior angulation. Posterior left third and fourth rib fractures. More lateral rib fractures at the same levels. Rib displacement is up to moderate. No visible pneumothorax. High humeral head which may be from chronic rotator cuff arthropathy. Osteopenia. IMPRESSION:  1. Segmental clavicle fracture involving the mid and lateral portions with midportion angulation. 2. Segmental left third and fourth rib fractures. Electronically Signed   By: Monte Fantasia M.D.   On: 05/28/2019 11:04   Dg Knee Complete 4 Views Left  Result Date: 05/28/2019 CLINICAL DATA:  Left knee pain after a fall last night. Initial encounter. EXAM: LEFT KNEE - COMPLETE 4+ VIEW COMPARISON:  None. FINDINGS: No evidence of fracture, dislocation, or joint effusion. No evidence of arthropathy or other focal bone abnormality. Chondrocalcinosis noted. Soft tissues are otherwise unremarkable. IMPRESSION: No acute abnormality. Electronically Signed   By: Inge Rise M.D.   On: 05/28/2019 10:58   Dg Hip Unilat With Pelvis 2-3 Views Left  Result Date: 05/28/2019 CLINICAL DATA:  Left hip pain after a fall last night. Initial encounter. EXAM: DG HIP (WITH OR WITHOUT PELVIS) 2-3V LEFT COMPARISON:  None. FINDINGS: The patient has an acute subcapital fracture of the left hip. No other acute bony abnormality is seen. Remote bilateral parasymphyseal pubic bone fractures are seen. The patient is status post fixation of a healed right hip fracture. No complicating feature. Right hip osteoarthritis noted. IMPRESSION: Acute subcapital fracture left hip. Remote  healed right hip and bilateral pubic rami fractures. Right worse than left hip osteoarthritis. Electronically Signed   By: Inge Rise M.D.   On: 05/28/2019 10:57    Impression/Recommendations Possible atypical infection of the lung: -CT chest revealed scattered patchy nodular densities in the lungs concerning for acute on chronic atypical infection.  Patient has no respiratory symptoms but significant muscle mass wasting.  I have discussed with infectious disease, Dr. Baxter Flattery who suggested sputum AFB if possible.  Otherwise, outpatient work-up/evaluation.  No precautions or antibiotics needed.  At this time, I do not think patient can cough up phlegm for sputum test due to rib fractures.  Mechanical fall at ALF History of recurrent fall Left rib fractures second through fourth Left clavicular fracture Left hip fracture -Trauma surgery and orthopedic surgery managing. -CK within normal range.  Chronic COPD not on oxygen or medication at home. -PRN DuoNeb.  Raynaud's disease  -May benefit from amlodipine if she needs blood pressure medication  Insomnia, anxiety and depression: Does not take Zoloft per daughter. -Continue home Ambien.   HTN: BP elevated on arrival but normalized.  Per daughter, does not take her medications at home. -Will optimize pain control -Hold home BP meds  Severe protein calorie malnutrition: Poor p.o. intake.  BMI 12.64. -Consult dietitian  Noncompliance -Encourage medication compliance.  QTC: -We will avoid QTC prolonging medications  Goal of care: Patient and daughter states that patient is DNR/DNI  -Consider involving palliative care.  Thank you for this consultation.  Our [facility] hospitalist team will follow the patient with you.  55 minutes with more than 50% spent in reviewing records, counseling patient and coordinating care.  Mercy Riding M.D. [facility] 05/28/2019, 6:20 PM

## 2019-05-28 NOTE — ED Triage Notes (Signed)
Per EMS, pt from [facility] assisted living.for fall last night. Pt has bruising and swelling to left shoulder, abrasions to left knee. Pt laid on floor from 1AM to 9AM. Pt denies loss of consciousness, did not hit head, is not on blood thinners. Pt has hx of left humeral head fracture. Pt normally ambulates w/ walker.   BP 190/100 HR 100 O2 98% RR 18 CBG 180 EKG sinus tach.

## 2019-05-28 NOTE — ED Notes (Signed)
Pt threw up norco, states she has difficulty taking pills

## 2019-05-29 DIAGNOSIS — S42002A Fracture of unspecified part of left clavicle, initial encounter for closed fracture: Secondary | ICD-10-CM

## 2019-05-29 DIAGNOSIS — Z66 Do not resuscitate: Secondary | ICD-10-CM

## 2019-05-29 DIAGNOSIS — Z7189 Other specified counseling: Secondary | ICD-10-CM

## 2019-05-29 DIAGNOSIS — S72012A Unspecified intracapsular fracture of left femur, initial encounter for closed fracture: Secondary | ICD-10-CM

## 2019-05-29 DIAGNOSIS — Z515 Encounter for palliative care: Secondary | ICD-10-CM

## 2019-05-29 DIAGNOSIS — W19XXXA Unspecified fall, initial encounter: Secondary | ICD-10-CM

## 2019-05-29 NOTE — Progress Notes (Signed)
PROGRESS NOTE    Jenny Torres  [id number] DOB: March 04, 1928 DOA: 05/28/2019 PCP: Hoyt Koch, MD   Brief Narrative:  85 y.o. female with history of COPD not on oxygen, rainouts disease, anxiety, depression, idiopathic peripheral neuropathy, recurrent fall, HTN and severe protein calorie malnutrition Presenting from independent living facility after mechanical fall and multiple fractures.  Admitted to trauma service, medical team consulted for possible atypical lung infection and comanagement of medical issues.   Assessment & Plan:   Principal Problem:   Multiple fractures of ribs, left side, initial encounter for closed fracture Active Problems:   Closed displaced fracture of left clavicle   Closed left hip fracture (HCC)   Multiple rib fractures  Patchy opacity in the lung concerning for atypical infection - Likely chronic.  No obvious evidence of infection.  She is not having any fevers, chills or any other signs that would suggest infection.  Clinically monitor this.  Case was discussed by previous provider with infectious disease.  Follow-up outpatient.  Mechanical fall leading to left-sided rib fracture second through fourth, left clavicular fracture, left hip fracture - Plans per surgical team and trauma.  Advise aggressive use of incentive spirometer.  Pain control.  Essential hypertension - Does not take any home medications.  Continue to monitor  Severe protein calorie malnutrition -Dietitian consult.  Prolonged QTC -Continue to monitor.  Chronic COPD -Stable.  PRN bronchodilators  Raynaud's disease -Continue home medicines  Goals of care- consulted palliative care  Subjective: Overall feels weak and generalized pain from her fractures.  Denies any shortness of breath, coughing, fevers and chills.  Review of Systems Otherwise negative except as per HPI, including: General: Denies fever, chills, night sweats or unintended weight loss. Resp: Denies  cough, wheezing, shortness of breath. Cardiac: Denies chest pain, palpitations, orthopnea, paroxysmal nocturnal dyspnea. GI: Denies abdominal pain, nausea, vomiting, diarrhea or constipation GU: Denies dysuria, frequency, hesitancy or incontinence MS: painful at the fracture sites.  Neuro: Denies headache, neurologic deficits (focal weakness, numbness, tingling), abnormal gait Psych: Denies anxiety, depression, SI/HI/AVH Skin: Denies new rashes or lesions ID: Denies sick contacts, exotic exposures, travel  Objective: Vitals:   05/29/19 0059 05/29/19 0635 05/29/19 0814 05/29/19 1152  BP: 137/78 (!) 153/91 (!) 146/75 (!) 143/64  Pulse: 99 95 93 91  Resp: 17 17 16 16   Temp: 97.8 F (36.6 C) 97.8 F (36.6 C) 98 F (36.7 C) 98.5 F (36.9 C)  TempSrc: Oral Oral Oral Oral  SpO2: 99% 100% 97% 100%  Weight:      Height:        Intake/Output Summary (Last 24 hours) at 05/29/2019 1152 Last data filed at 05/29/2019 0248 Gross per 24 hour  Intake 515.05 ml  Output 700 ml  Net -184.95 ml   Filed Weights   05/28/19 1755  Weight: 33.4 kg    Examination:  General exam: Elderly cachectic frail-appearing with muscle wasting Respiratory system: Bibasilar crackles Cardiovascular system: S1 & S2 heard, RRR. No JVD, murmurs, rubs, gallops or clicks. No pedal edema. Gastrointestinal system: Abdomen is nondistended, soft and nontender. No organomegaly or masses felt. Normal bowel sounds heard. Central nervous system: Alert and oriented. No focal neurological deficits. Extremities: Tenderness to chest wall palpation Skin: No rashes, lesions or ulcers Psychiatry: Judgement and insight appear normal. Mood & affect appropriate.     Data Reviewed:   CBC: Recent Labs  Lab 05/28/19 1012  WBC 17.9*  NEUTROABS 16.1*  HGB 12.3  HCT 37.4  MCV 88.4  PLT 123456   Basic Metabolic Panel: Recent Labs  Lab 05/28/19 1012  NA 130*  K 4.1  CL 93*  CO2 26  GLUCOSE 119*  BUN 9  CREATININE 0.43*    CALCIUM 9.1   GFR: Estimated Creatinine Clearance: 25.1 mL/min (A) (by C-G formula based on SCr of 0.43 mg/dL (L)). Liver Function Tests: Recent Labs  Lab 05/28/19 1012  AST 32  ALT 23  ALKPHOS 220*  BILITOT 0.9  PROT 7.2  ALBUMIN 3.8   No results for input(s): LIPASE, AMYLASE in the last 168 hours. No results for input(s): AMMONIA in the last 168 hours. Coagulation Profile: No results for input(s): INR, PROTIME in the last 168 hours. Cardiac Enzymes: Recent Labs  Lab 05/28/19 1012  CKTOTAL 200   BNP (last 3 results) No results for input(s): PROBNP in the last 8760 hours. HbA1C: No results for input(s): HGBA1C in the last 72 hours. CBG: No results for input(s): GLUCAP in the last 168 hours. Lipid Profile: No results for input(s): CHOL, HDL, LDLCALC, TRIG, CHOLHDL, LDLDIRECT in the last 72 hours. Thyroid Function Tests: No results for input(s): TSH, T4TOTAL, FREET4, T3FREE, THYROIDAB in the last 72 hours. Anemia Panel: No results for input(s): VITAMINB12, FOLATE, FERRITIN, TIBC, IRON, RETICCTPCT in the last 72 hours. Sepsis Labs: No results for input(s): PROCALCITON, LATICACIDVEN in the last 168 hours.  Recent Results (from the past 240 hour(s))  SARS Coronavirus 2 [facility] order, Performed in [facility] hospital lab) Nasopharyngeal Nasopharyngeal Swab     Status: None   Collection Time: 05/28/19 11:55 AM   Specimen: Nasopharyngeal Swab  Result Value Ref Range Status   SARS Coronavirus 2 NEGATIVE NEGATIVE Final    Comment: (NOTE) If result is NEGATIVE SARS-CoV-2 target nucleic acids are NOT DETECTED. The SARS-CoV-2 RNA is generally detectable in upper and lower  respiratory specimens during the acute phase of infection. The lowest  concentration of SARS-CoV-2 viral copies this assay can detect is 250  copies / mL. A negative result does not preclude SARS-CoV-2 infection  and should not be used as the sole basis for treatment or other  patient management  decisions.  A negative result may occur with  improper specimen collection / handling, submission of specimen other  than nasopharyngeal swab, presence of viral mutation(s) within the  areas targeted by this assay, and inadequate number of viral copies  (<250 copies / mL). A negative result must be combined with clinical  observations, patient history, and epidemiological information. If result is POSITIVE SARS-CoV-2 target nucleic acids are DETECTED. The SARS-CoV-2 RNA is generally detectable in upper and lower  respiratory specimens dur ing the acute phase of infection.  Positive  results are indicative of active infection with SARS-CoV-2.  Clinical  correlation with patient history and other diagnostic information is  necessary to determine patient infection status.  Positive results do  not rule out bacterial infection or co-infection with other viruses. If result is PRESUMPTIVE POSTIVE SARS-CoV-2 nucleic acids MAY BE PRESENT.   A presumptive positive result was obtained on the submitted specimen  and confirmed on repeat testing.  While 2019 novel coronavirus  (SARS-CoV-2) nucleic acids may be present in the submitted sample  additional confirmatory testing may be necessary for epidemiological  and / or clinical management purposes  to differentiate between  SARS-CoV-2 and other Sarbecovirus currently known to infect humans.  If clinically indicated additional testing with an alternate test  methodology [phone]) is advised. The SARS-CoV-2 RNA is generally  detectable  in upper and lower respiratory sp ecimens during the acute  phase of infection. The expected result is Negative. Fact Sheet for Patients:  StrictlyIdeas.no Fact Sheet for Healthcare Providers: BankingDealers.co.za This test is not yet approved or cleared by the [place] FDA and has been authorized for detection and/or diagnosis of SARS-CoV-2 by FDA under an  Emergency Use Authorization (EUA).  This EUA will remain in effect (meaning this test can be used) for the duration of the COVID-19 declaration under Section 564(b)(1) of the Act, 21 U.S.C. section 360bbb-3(b)(1), unless the authorization is terminated or revoked sooner. Performed at [facility], [place] [address] [place], [place] [address]          Radiology Studies: Dg Clavicle Left  Result Date: 05/28/2019 CLINICAL DATA:  Fall with bruising over the left shoulder and clavicle. EXAM: LEFT CLAVICLE - 2+ VIEWS COMPARISON:  Left shoulder 05/28/2019 and chest radiograph 04/21/2019 FINDINGS: Segmental fracture involving the left clavicle. There is a fracture in the mid left clavicle with the lateral segment displaced superiorly. There is a second fracture along the distal or lateral aspect of the left clavicle. Chronic thickening and calcifications at the left lung apex. Atherosclerotic calcifications at the aortic arch. No evidence for a large left pneumothorax. Evidence for fractures involving the left third rib. Displaced fracture involving the left fourth rib. Question a fracture of the left fifth rib. IMPRESSION: 1. Segmental left clavicle fracture. 2. Left rib fractures. Electronically Signed   By: Markus Daft M.D.   On: 05/28/2019 10:58   Ct Head Wo Contrast  Result Date: 05/28/2019 CLINICAL DATA:  Per EMS, pt from [facility] assisted living.for fall last night. Pt has bruising and swelling to left shoulder, abrasions to left knee. Pt laid on floor from 1AM to 9AM. Pt denies loss of consciousness, did not hit head, is not on blood thinners. EXAM: CT HEAD WITHOUT CONTRAST CT CERVICAL SPINE WITHOUT CONTRAST TECHNIQUE: Multidetector CT imaging of the head and cervical spine was performed following the standard protocol without intravenous contrast. Multiplanar CT image reconstructions of the cervical spine were also generated. COMPARISON:  09/14/2016 CT head FINDINGS: CT  HEAD FINDINGS Brain: There is central and cortical atrophy. Periventricular white matter changes are consistent with small vessel disease. There is no intra or extra-axial fluid collection or mass lesion. The basilar cisterns and ventricles have a normal appearance. There is no CT evidence for acute infarction or hemorrhage. Vascular: There is minimal atherosclerotic calcification of the internal carotid arteries. No hyperdense vessels. Skull: No calvarial fracture. LEFT parietal scalp edema/hematoma. Sinuses/Orbits: Minimal mucosal thickening of the LEFT maxillary sinus. No acute fracture. Other: None CT CERVICAL SPINE FINDINGS Alignment: There is loss of cervical lordosis. This may be secondary to splinting, soft tissue injury, or positioning. Otherwise alignment is normal. Skull base and vertebrae: No acute fracture. No primary bone lesion or focal pathologic process. Soft tissues and spinal canal: No prevertebral fluid or swelling. No visible canal hematoma. Disc levels: Disc height loss particularly at C5-6, C6-7, and C7-T1. Upper chest: There is biapical pleuroparenchymal change. Acute fractures of posterior LEFT ribs 1-4. Acute fracture of the LEFT clavicle. No pneumothorax at the apex. Other: None IMPRESSION: 1. Atrophy and small vessel disease. 2. No evidence for acute intracranial abnormality. 3. LEFT parietal scalp edema/hematoma.  No underlying fracture. 4. No evidence for acute cervical spine abnormality. 5. Acute fractures of the posterior LEFT ribs 1-4. Consider CT of the chest with intravenous contrast. 6. Acute fracture  of the LEFT clavicle. These results were called by telephone at the time of interpretation on 05/28/2019 at 11:01 am to provider MARGAUX VENTER , who verbally acknowledged these results. Electronically Signed   By: Nolon Nations M.D.   On: 05/28/2019 11:05   Ct Chest W Contrast  Result Date: 05/28/2019 CLINICAL DATA:  85 year old with fall and left clavicle rib fractures.  Left-sided pain. EXAM: CT CHEST WITH CONTRAST TECHNIQUE: Multidetector CT imaging of the chest was performed during intravenous contrast administration. CONTRAST:  68mL OMNIPAQUE IOHEXOL 300 MG/ML  SOLN COMPARISON:  Chest CT 10/06/2012 FINDINGS: Cardiovascular: Normal caliber of the thoracic aorta. No evidence for an aortic dissection. Atherosclerotic calcifications in the aorta. Pulmonary arteries are patent. Mediastinum/Nodes: Calcifications in the mediastinum compatible with old granulomatous disease. No enlarged chest lymph nodes. 5 mm low-density in the left thyroid lobe is likely an incidental finding in a patient of this age. No axillary lymph node enlargement. Lungs/Pleura: No pleural effusion or pneumothorax. Irregular peripheral nodules in the lungs. These findings are most prominent at the base of the right middle lobe and lingula. Some of these findings were present in the exam from 2014. Largest area of peripheral nodularity is in the lingula on sequence 7, image 82 measuring up to 1.1 cm. These patchy densities and nodularity have progressed since 2014, particularly in the left lower lobe. No significant consolidation in either lung. Upper Abdomen: Punctate low-density structure in the right kidney likely represents a cyst. No acute findings in the visualized upper abdomen. Musculoskeletal: Displaced fracture involving the mid left clavicle. Minimally displaced fracture involving the distal left clavicle. Left AC joint is intact. Left shoulder is located. Irregularity along the posterior left third rib may be chronic and question a fracture at this location. Depressed angulated fracture of the anterior left second rib. There is probably a nondisplaced fracture involving the anterior left third rib. There is a displaced fracture in the lateral left third rib. Mildly displaced fracture of the posterior left fourth rib. Additional fracture along the lateral left fourth rib. Evidence for old right  posterior rib fractures. Thoracic vertebral body heights are maintained. IMPRESSION: 1. Left clavicle fractures. 2. Acute left rib fractures involving the left second, third and fourth ribs. Old right rib fractures. Negative for pneumothorax. 3. Scattered patchy nodular densities in lungs. Findings are suggestive for an acute on chronic infectious or inflammatory process. There has been progressive of disease since 2014. Findings could represent an atypical infectious etiology such as Mycobacterium avium intracellulare (MAI). Electronically Signed   By: Markus Daft M.D.   On: 05/28/2019 13:40   Ct Cervical Spine Wo Contrast  Result Date: 05/28/2019 CLINICAL DATA:  Per EMS, pt from [facility] assisted living.for fall last night. Pt has bruising and swelling to left shoulder, abrasions to left knee. Pt laid on floor from 1AM to 9AM. Pt denies loss of consciousness, did not hit head, is not on blood thinners. EXAM: CT HEAD WITHOUT CONTRAST CT CERVICAL SPINE WITHOUT CONTRAST TECHNIQUE: Multidetector CT imaging of the head and cervical spine was performed following the standard protocol without intravenous contrast. Multiplanar CT image reconstructions of the cervical spine were also generated. COMPARISON:  09/14/2016 CT head FINDINGS: CT HEAD FINDINGS Brain: There is central and cortical atrophy. Periventricular white matter changes are consistent with small vessel disease. There is no intra or extra-axial fluid collection or mass lesion. The basilar cisterns and ventricles have a normal appearance. There is no CT evidence for acute infarction or hemorrhage.  Vascular: There is minimal atherosclerotic calcification of the internal carotid arteries. No hyperdense vessels. Skull: No calvarial fracture. LEFT parietal scalp edema/hematoma. Sinuses/Orbits: Minimal mucosal thickening of the LEFT maxillary sinus. No acute fracture. Other: None CT CERVICAL SPINE FINDINGS Alignment: There is loss of cervical lordosis. This  may be secondary to splinting, soft tissue injury, or positioning. Otherwise alignment is normal. Skull base and vertebrae: No acute fracture. No primary bone lesion or focal pathologic process. Soft tissues and spinal canal: No prevertebral fluid or swelling. No visible canal hematoma. Disc levels: Disc height loss particularly at C5-6, C6-7, and C7-T1. Upper chest: There is biapical pleuroparenchymal change. Acute fractures of posterior LEFT ribs 1-4. Acute fracture of the LEFT clavicle. No pneumothorax at the apex. Other: None IMPRESSION: 1. Atrophy and small vessel disease. 2. No evidence for acute intracranial abnormality. 3. LEFT parietal scalp edema/hematoma.  No underlying fracture. 4. No evidence for acute cervical spine abnormality. 5. Acute fractures of the posterior LEFT ribs 1-4. Consider CT of the chest with intravenous contrast. 6. Acute fracture of the LEFT clavicle. These results were called by telephone at the time of interpretation on 05/28/2019 at 11:01 am to provider MARGAUX VENTER , who verbally acknowledged these results. Electronically Signed   By: Nolon Nations M.D.   On: 05/28/2019 11:05   Ct Hip Left Wo Contrast  Result Date: 05/28/2019 CLINICAL DATA:  Left hip pain EXAM: CT OF THE LEFT HIP WITHOUT CONTRAST TECHNIQUE: Multidetector CT imaging of the left hip was performed according to the standard protocol. Multiplanar CT image reconstructions were also generated. COMPARISON:  X-ray 05/28/2019 FINDINGS: Bones/Joint/Cartilage Acute minimally impacted subcapital fracture of the left femoral neck. No intra-articular or intertrochanteric fracture component. No significant displacement or angulation. Acute to subacute appearing fracture of the right inferior pubic ramus at site of prior healed fracture (series 2, image 63). Mild degenerative changes of the left hip joint without dislocation. Pubic symphysis is intact with chondrocalcinosis. Ligaments Suboptimally assessed by CT. Muscles and  Tendons Atrophy and fatty infiltration of the included musculature. No gross tendinous abnormality. Soft tissues Induration of the subcutaneous soft tissues posterior to the left hip. No focal hematoma or fluid collection. Moderate distention of the urinary bladder. Moderate volume of stool within the visualized colon. IMPRESSION: 1. Acute, minimally impacted subcapital fracture of the left femoral neck. 2. Acute on chronic appearing right inferior pubic ramus fracture. Electronically Signed   By: Davina Poke M.D.   On: 05/28/2019 13:19   Dg Shoulder Left  Result Date: 05/28/2019 CLINICAL DATA:  Fall with left clavicle injury EXAM: LEFT SHOULDER - 2+ VIEW COMPARISON:  None similar FINDINGS: Segmental fracture of the left clavicle involving the mid and lateral portions. The mid clavicle fracture shows apex superior angulation. Posterior left third and fourth rib fractures. More lateral rib fractures at the same levels. Rib displacement is up to moderate. No visible pneumothorax. High humeral head which may be from chronic rotator cuff arthropathy. Osteopenia. IMPRESSION: 1. Segmental clavicle fracture involving the mid and lateral portions with midportion angulation. 2. Segmental left third and fourth rib fractures. Electronically Signed   By: Monte Fantasia M.D.   On: 05/28/2019 11:04   Dg Knee Complete 4 Views Left  Result Date: 05/28/2019 CLINICAL DATA:  Left knee pain after a fall last night. Initial encounter. EXAM: LEFT KNEE - COMPLETE 4+ VIEW COMPARISON:  None. FINDINGS: No evidence of fracture, dislocation, or joint effusion. No evidence of arthropathy or other focal bone abnormality. Chondrocalcinosis  noted. Soft tissues are otherwise unremarkable. IMPRESSION: No acute abnormality. Electronically Signed   By: Inge Rise M.D.   On: 05/28/2019 10:58   Dg Hip Unilat With Pelvis 2-3 Views Left  Result Date: 05/28/2019 CLINICAL DATA:  Left hip pain after a fall last night. Initial encounter.  EXAM: DG HIP (WITH OR WITHOUT PELVIS) 2-3V LEFT COMPARISON:  None. FINDINGS: The patient has an acute subcapital fracture of the left hip. No other acute bony abnormality is seen. Remote bilateral parasymphyseal pubic bone fractures are seen. The patient is status post fixation of a healed right hip fracture. No complicating feature. Right hip osteoarthritis noted. IMPRESSION: Acute subcapital fracture left hip. Remote healed right hip and bilateral pubic rami fractures. Right worse than left hip osteoarthritis. Electronically Signed   By: Inge Rise M.D.   On: 05/28/2019 10:57        Scheduled Meds: Continuous Infusions:  dextrose 5 % and 0.45 % NaCl with KCl 20 mEq/L 75 mL/hr at 05/29/19 0800     LOS: 1 day   Time spent= 15 mins    Anaalicia Reimann Arsenio Loader, MD [facility]  If 7PM-7AM, please contact night-coverage [email or web address] 05/29/2019, 11:52 AM

## 2019-05-29 NOTE — Progress Notes (Signed)
Subjective/Chief Complaint: Notes pain with movement. Is thinking she will not pursue surgery.    Objective: Vital signs in last 24 hours: Temp:  [97.6 F (36.4 C)-98.5 F (36.9 C)] 98 F (36.7 C) (09/07 0814) Pulse Rate:  [92-109] 93 (09/07 0814) Resp:  [16-20] 16 (09/07 0814) BP: (132-177)/(71-94) 146/75 (09/07 0814) SpO2:  [94 %-100 %] 97 % (09/07 0814) Weight:  [33.4 kg] 33.4 kg (09/06 1755) Last BM Date: (pta)  Intake/Output from previous day: 09/06 0701 - 09/07 0700 In: 515.1 [I.V.:15.1; IV Piggyback:500] Out: 700 [Urine:700] Intake/Output this shift: No intake/output data recorded.  General appearance: alert and cooperative Resp: clear to auscultation bilaterally GI: soft, non-tender; bowel sounds normal; no masses,  no organomegaly Extremities: mild left knee tenderness without edema or crepitus Neurologic: Grossly normal  Lab Results:  Recent Labs    05/28/19 1012  WBC 17.9*  HGB 12.3  HCT 37.4  PLT 345   BMET Recent Labs    05/28/19 1012  NA 130*  K 4.1  CL 93*  CO2 26  GLUCOSE 119*  BUN 9  CREATININE 0.43*  CALCIUM 9.1   PT/INR No results for input(s): LABPROT, INR in the last 72 hours. ABG No results for input(s): PHART, HCO3 in the last 72 hours.  Invalid input(s): PCO2, PO2  Studies/Results: Dg Clavicle Left  Result Date: 05/28/2019 CLINICAL DATA:  Fall with bruising over the left shoulder and clavicle. EXAM: LEFT CLAVICLE - 2+ VIEWS COMPARISON:  Left shoulder 05/28/2019 and chest radiograph 04/21/2019 FINDINGS: Segmental fracture involving the left clavicle. There is a fracture in the mid left clavicle with the lateral segment displaced superiorly. There is a second fracture along the distal or lateral aspect of the left clavicle. Chronic thickening and calcifications at the left lung apex. Atherosclerotic calcifications at the aortic arch. No evidence for a large left pneumothorax. Evidence for fractures involving the left third rib.  Displaced fracture involving the left fourth rib. Question a fracture of the left fifth rib. IMPRESSION: 1. Segmental left clavicle fracture. 2. Left rib fractures. Electronically Signed   By: Markus Daft M.D.   On: 05/28/2019 10:58   Ct Head Wo Contrast  Result Date: 05/28/2019 CLINICAL DATA:  Per EMS, pt from [facility] assisted living.for fall last night. Pt has bruising and swelling to left shoulder, abrasions to left knee. Pt laid on floor from 1AM to 9AM. Pt denies loss of consciousness, did not hit head, is not on blood thinners. EXAM: CT HEAD WITHOUT CONTRAST CT CERVICAL SPINE WITHOUT CONTRAST TECHNIQUE: Multidetector CT imaging of the head and cervical spine was performed following the standard protocol without intravenous contrast. Multiplanar CT image reconstructions of the cervical spine were also generated. COMPARISON:  09/14/2016 CT head FINDINGS: CT HEAD FINDINGS Brain: There is central and cortical atrophy. Periventricular white matter changes are consistent with small vessel disease. There is no intra or extra-axial fluid collection or mass lesion. The basilar cisterns and ventricles have a normal appearance. There is no CT evidence for acute infarction or hemorrhage. Vascular: There is minimal atherosclerotic calcification of the internal carotid arteries. No hyperdense vessels. Skull: No calvarial fracture. LEFT parietal scalp edema/hematoma. Sinuses/Orbits: Minimal mucosal thickening of the LEFT maxillary sinus. No acute fracture. Other: None CT CERVICAL SPINE FINDINGS Alignment: There is loss of cervical lordosis. This may be secondary to splinting, soft tissue injury, or positioning. Otherwise alignment is normal. Skull base and vertebrae: No acute fracture. No primary bone lesion or focal pathologic process. Soft  tissues and spinal canal: No prevertebral fluid or swelling. No visible canal hematoma. Disc levels: Disc height loss particularly at C5-6, C6-7, and C7-T1. Upper chest: There  is biapical pleuroparenchymal change. Acute fractures of posterior LEFT ribs 1-4. Acute fracture of the LEFT clavicle. No pneumothorax at the apex. Other: None IMPRESSION: 1. Atrophy and small vessel disease. 2. No evidence for acute intracranial abnormality. 3. LEFT parietal scalp edema/hematoma.  No underlying fracture. 4. No evidence for acute cervical spine abnormality. 5. Acute fractures of the posterior LEFT ribs 1-4. Consider CT of the chest with intravenous contrast. 6. Acute fracture of the LEFT clavicle. These results were called by telephone at the time of interpretation on 05/28/2019 at 11:01 am to provider MARGAUX VENTER , who verbally acknowledged these results. Electronically Signed   By: Nolon Nations M.D.   On: 05/28/2019 11:05   Ct Chest W Contrast  Result Date: 05/28/2019 CLINICAL DATA:  85 year old with fall and left clavicle rib fractures. Left-sided pain. EXAM: CT CHEST WITH CONTRAST TECHNIQUE: Multidetector CT imaging of the chest was performed during intravenous contrast administration. CONTRAST:  46mL OMNIPAQUE IOHEXOL 300 MG/ML  SOLN COMPARISON:  Chest CT 10/06/2012 FINDINGS: Cardiovascular: Normal caliber of the thoracic aorta. No evidence for an aortic dissection. Atherosclerotic calcifications in the aorta. Pulmonary arteries are patent. Mediastinum/Nodes: Calcifications in the mediastinum compatible with old granulomatous disease. No enlarged chest lymph nodes. 5 mm low-density in the left thyroid lobe is likely an incidental finding in a patient of this age. No axillary lymph node enlargement. Lungs/Pleura: No pleural effusion or pneumothorax. Irregular peripheral nodules in the lungs. These findings are most prominent at the base of the right middle lobe and lingula. Some of these findings were present in the exam from 2014. Largest area of peripheral nodularity is in the lingula on sequence 7, image 82 measuring up to 1.1 cm. These patchy densities and nodularity have progressed  since 2014, particularly in the left lower lobe. No significant consolidation in either lung. Upper Abdomen: Punctate low-density structure in the right kidney likely represents a cyst. No acute findings in the visualized upper abdomen. Musculoskeletal: Displaced fracture involving the mid left clavicle. Minimally displaced fracture involving the distal left clavicle. Left AC joint is intact. Left shoulder is located. Irregularity along the posterior left third rib may be chronic and question a fracture at this location. Depressed angulated fracture of the anterior left second rib. There is probably a nondisplaced fracture involving the anterior left third rib. There is a displaced fracture in the lateral left third rib. Mildly displaced fracture of the posterior left fourth rib. Additional fracture along the lateral left fourth rib. Evidence for old right posterior rib fractures. Thoracic vertebral body heights are maintained. IMPRESSION: 1. Left clavicle fractures. 2. Acute left rib fractures involving the left second, third and fourth ribs. Old right rib fractures. Negative for pneumothorax. 3. Scattered patchy nodular densities in lungs. Findings are suggestive for an acute on chronic infectious or inflammatory process. There has been progressive of disease since 2014. Findings could represent an atypical infectious etiology such as Mycobacterium avium intracellulare (MAI). Electronically Signed   By: Markus Daft M.D.   On: 05/28/2019 13:40   Ct Cervical Spine Wo Contrast  Result Date: 05/28/2019 CLINICAL DATA:  Per EMS, pt from [facility] assisted living.for fall last night. Pt has bruising and swelling to left shoulder, abrasions to left knee. Pt laid on floor from 1AM to 9AM. Pt denies loss of consciousness, did not hit  head, is not on blood thinners. EXAM: CT HEAD WITHOUT CONTRAST CT CERVICAL SPINE WITHOUT CONTRAST TECHNIQUE: Multidetector CT imaging of the head and cervical spine was performed  following the standard protocol without intravenous contrast. Multiplanar CT image reconstructions of the cervical spine were also generated. COMPARISON:  09/14/2016 CT head FINDINGS: CT HEAD FINDINGS Brain: There is central and cortical atrophy. Periventricular white matter changes are consistent with small vessel disease. There is no intra or extra-axial fluid collection or mass lesion. The basilar cisterns and ventricles have a normal appearance. There is no CT evidence for acute infarction or hemorrhage. Vascular: There is minimal atherosclerotic calcification of the internal carotid arteries. No hyperdense vessels. Skull: No calvarial fracture. LEFT parietal scalp edema/hematoma. Sinuses/Orbits: Minimal mucosal thickening of the LEFT maxillary sinus. No acute fracture. Other: None CT CERVICAL SPINE FINDINGS Alignment: There is loss of cervical lordosis. This may be secondary to splinting, soft tissue injury, or positioning. Otherwise alignment is normal. Skull base and vertebrae: No acute fracture. No primary bone lesion or focal pathologic process. Soft tissues and spinal canal: No prevertebral fluid or swelling. No visible canal hematoma. Disc levels: Disc height loss particularly at C5-6, C6-7, and C7-T1. Upper chest: There is biapical pleuroparenchymal change. Acute fractures of posterior LEFT ribs 1-4. Acute fracture of the LEFT clavicle. No pneumothorax at the apex. Other: None IMPRESSION: 1. Atrophy and small vessel disease. 2. No evidence for acute intracranial abnormality. 3. LEFT parietal scalp edema/hematoma.  No underlying fracture. 4. No evidence for acute cervical spine abnormality. 5. Acute fractures of the posterior LEFT ribs 1-4. Consider CT of the chest with intravenous contrast. 6. Acute fracture of the LEFT clavicle. These results were called by telephone at the time of interpretation on 05/28/2019 at 11:01 am to provider MARGAUX VENTER , who verbally acknowledged these results. Electronically  Signed   By: Nolon Nations M.D.   On: 05/28/2019 11:05   Ct Hip Left Wo Contrast  Result Date: 05/28/2019 CLINICAL DATA:  Left hip pain EXAM: CT OF THE LEFT HIP WITHOUT CONTRAST TECHNIQUE: Multidetector CT imaging of the left hip was performed according to the standard protocol. Multiplanar CT image reconstructions were also generated. COMPARISON:  X-ray 05/28/2019 FINDINGS: Bones/Joint/Cartilage Acute minimally impacted subcapital fracture of the left femoral neck. No intra-articular or intertrochanteric fracture component. No significant displacement or angulation. Acute to subacute appearing fracture of the right inferior pubic ramus at site of prior healed fracture (series 2, image 63). Mild degenerative changes of the left hip joint without dislocation. Pubic symphysis is intact with chondrocalcinosis. Ligaments Suboptimally assessed by CT. Muscles and Tendons Atrophy and fatty infiltration of the included musculature. No gross tendinous abnormality. Soft tissues Induration of the subcutaneous soft tissues posterior to the left hip. No focal hematoma or fluid collection. Moderate distention of the urinary bladder. Moderate volume of stool within the visualized colon. IMPRESSION: 1. Acute, minimally impacted subcapital fracture of the left femoral neck. 2. Acute on chronic appearing right inferior pubic ramus fracture. Electronically Signed   By: Davina Poke M.D.   On: 05/28/2019 13:19   Dg Shoulder Left  Result Date: 05/28/2019 CLINICAL DATA:  Fall with left clavicle injury EXAM: LEFT SHOULDER - 2+ VIEW COMPARISON:  None similar FINDINGS: Segmental fracture of the left clavicle involving the mid and lateral portions. The mid clavicle fracture shows apex superior angulation. Posterior left third and fourth rib fractures. More lateral rib fractures at the same levels. Rib displacement is up to moderate. No visible pneumothorax.  High humeral head which may be from chronic rotator cuff arthropathy.  Osteopenia. IMPRESSION: 1. Segmental clavicle fracture involving the mid and lateral portions with midportion angulation. 2. Segmental left third and fourth rib fractures. Electronically Signed   By: Monte Fantasia M.D.   On: 05/28/2019 11:04   Dg Knee Complete 4 Views Left  Result Date: 05/28/2019 CLINICAL DATA:  Left knee pain after a fall last night. Initial encounter. EXAM: LEFT KNEE - COMPLETE 4+ VIEW COMPARISON:  None. FINDINGS: No evidence of fracture, dislocation, or joint effusion. No evidence of arthropathy or other focal bone abnormality. Chondrocalcinosis noted. Soft tissues are otherwise unremarkable. IMPRESSION: No acute abnormality. Electronically Signed   By: Inge Rise M.D.   On: 05/28/2019 10:58   Dg Hip Unilat With Pelvis 2-3 Views Left  Result Date: 05/28/2019 CLINICAL DATA:  Left hip pain after a fall last night. Initial encounter. EXAM: DG HIP (WITH OR WITHOUT PELVIS) 2-3V LEFT COMPARISON:  None. FINDINGS: The patient has an acute subcapital fracture of the left hip. No other acute bony abnormality is seen. Remote bilateral parasymphyseal pubic bone fractures are seen. The patient is status post fixation of a healed right hip fracture. No complicating feature. Right hip osteoarthritis noted. IMPRESSION: Acute subcapital fracture left hip. Remote healed right hip and bilateral pubic rami fractures. Right worse than left hip osteoarthritis. Electronically Signed   By: Inge Rise M.D.   On: 05/28/2019 10:57    Anti-infectives: Anti-infectives (From admission, onward)   Start     Dose/Rate Route Frequency Ordered Stop   05/28/19 1530  azithromycin (ZITHROMAX) 500 mg in sodium chloride 0.9 % 250 mL IVPB     500 mg 250 mL/hr over 60 Minutes Intravenous  Once 05/28/19 1524 05/29/19 0750   05/28/19 1430  vancomycin (VANCOCIN) IVPB 750 mg/150 ml premix     750 mg 150 mL/hr over 60 Minutes Intravenous  Once 05/28/19 1401 05/28/19 1702   05/28/19 1415  ceFEPIme (MAXIPIME) 2  g in sodium chloride 0.9 % 100 mL IVPB     2 g 200 mL/hr over 30 Minutes Intravenous  Once 05/28/19 1405 05/28/19 1544   05/28/19 1400  aztreonam (AZACTAM) 2 g in sodium chloride 0.9 % 100 mL IVPB  Status:  Discontinued     2 g 200 mL/hr over 30 Minutes Intravenous  Once 05/28/19 1354 05/28/19 1405      Assessment/Plan: 85yo woman s/p ground level fall. Multiple prior falls with various fractures Left femoral neck fracture- she is thinking she will not have surgery. Will have her discuss this more with orthopedics today as she is having pain with movement.  Left clavicle fx- non-op Left 2-4 ribs- pulm toilet, pain control Left knee pain- plain films neg Patchy nodular densities in lungs concerning for atypical pna- appreciate medicine assistance  If no plans for surgical intervention would resume follow up with palliative   LOS: 1 day    Clovis Riley 05/29/2019

## 2019-05-29 NOTE — Consult Note (Signed)
Consultation Note Date: 05/29/2019   Patient Name: Jenny Torres  DOB: Jan 17, 1928  MRN: [id number]  Age / Sex: 85 y.o., female   PCP: Hoyt Koch, MD Referring Physician: Md, Trauma, MD   REASON FOR CONSULTATION:Establishing goals of care  Palliative Care consult requested for this 85 y.o. female with multiple medical problems including COPD, GERD, hypertension, hyperlipidemia, ideopathic peripheral neuropathy, mitral valve prolapse, osteoporosis, and Raynaud's disease. Jenny Torres presented from [facility] living facility s/p ground level fall in her apartment. She was unable to get up and remained on the floor until assistance arrived the next morning. CT C-spine showed acute fractures of the posterior left ribs 1-4, left parietal scalp edema/hematoma, atrophy and small vessel disease. Acute fracture of the left clavicle. Chest CT showed old right rib fracture. Negative for pneumothorax. Left hip x-ray shows an acute subcapital fracture of left hip, bilateral hip osteoarthritis right worse, and remote right hip and bilateral pubic fractures healing. Since admission patient has been under the care of trauma and surgical team. She is undecided regarding surgical interventions. Palliative Medicine team consulted for goals of care.   Clinical Assessment and Goals of Care: I have reviewed medical records including lab results, imaging, Epic notes, and MAR, received report from the bedside RN, and assessed the patient. I met at the bedside with patient and spoke with her daughter, Jenny Torres) via phone to discuss diagnosis prognosis, [place], EOL wishes, disposition and options. Patient is awake, alert, and oriented x3. She denies pain as long as she remains still and un-moved. Denies shortness of breath.   I introduced Palliative Medicine as specialized medical care for people living with serious illness. It focuses on providing relief from the symptoms and stress of  a serious illness. The goal is to improve quality of life for both the patient and the family.  We discussed a brief life review of the patient, along with her functional and nutritional status. 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 She had 2 children, unfortunately her son passed away last year due to [place]. Her husband passed away in Dec 01, 2016 with Alzheimer's Disease. Patient lives alone in her apartment at [facility], which she just recently moved into no more than a week ago. Prior to that she resided alone at home until she fell and required SNF for rehab at [facility].   Patient and daughter reports patient was ambulatory with walker assistance prior to admission. Patient has experienced several falls over the past year resulting in fractures. Jenny Torres reports her appetite is fair, and that she has never been a big eater.   We discussed Her current illness and what it means in the larger context of Her on-going co-morbidities. With specific discussions regarding frequent falls, multiple fractures, and her overall functional state. Natural disease trajectory and expectations at EOL were discussed.  Both patient and daughter verbalized understanding of patient's condition.   Jenny Torres states she is not planning to have surgery as she is "too old and does not want to die because she tried to fix something!" We discussed the risk of surgery but also the benefits of pain control with some hopes she would have the ability to become a little more mobile versus more chair/bedbound. She verbalized awareness stating she does not have anywhere to go and would be ok in bed or her recliner. I educated both her and her daughter  on the use of pain medication for comfort in the event she did not have surgery and also the need for good bowel regimen to prevent constipation.   Patient states "I am aware of the dying process, palliative,  and hospice. After dealing with the death of my husband and my son, you become quite familiar and close with those services because they were our support!" Support given. She states "I have a strong faith and at this point of my life, going home, being comfortable, and allowing God to do his will is what matters to me! No one has ever left this life alive!"   Jenny Torres (daughter) states patient was recently evaluated by [facility] for outpatient palliative versus hospice support however patient was hospitalized and she was unable to follow up with them. They both expressed the goal was to get her back home with 24/7 assistance and hospice support.   I attempted to elicit values and goals of care important to the patient.    The difference between aggressive medical intervention and comfort care was considered in light of the patient's goals of care. I educated patient/family on what comfort care measures would look like versus aggressive/palliative approach.   Although they are familiar with outpatient palliative and hospice, I re-educated patient and daughter to make sure they had a clear understanding. Patient and family verbalized their understanding and awareness of both palliative and hospice's goals and philosophy of care. Ms. 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   Daughter and patient both requesting hospice, with daughter requesting to use [facility] since they have began establishing a relationship. Patient and daughter aware I will discuss with attending providers and place referral. Jenny Torres, is requesting equipment in the home such as a hospital bed, BSC, and tray. Advised this would be included in the referral.   Patient reports she has an advanced directive. She confirms Jenny Torres is her HCPOA and also they both confirm wishes for DNR/DNI.   Questions and concerns were addressed. The family was encouraged to call with  questions or concerns.  PMT will continue to support holistically.   SOCIAL HISTORY:     reports that she quit smoking about 35 years ago. Her smoking use included cigarettes. She has a 25.00 pack-year smoking history. She has never used smokeless tobacco. She reports current alcohol use. She reports that she does not use drugs.  CODE STATUS: DNR  ADVANCE DIRECTIVES: Patient is A&O x3   SYMPTOM MANAGEMENT: per attending   Palliative Prophylaxis:   Aspiration, Bowel Regimen, Delirium Protocol, Frequent Pain Assessment, Oral Care, Palliative Wound Care and Turn Reposition  PSYCHO-SOCIAL/SPIRITUAL:  Support System: Family   Desire for further Chaplaincy support: NO   Additional Recommendations (Limitations, Scope, Preferences):  No Surgical Procedures, continue to treat the treatable with no aggressive measures    PAST MEDICAL HISTORY: Past Medical History:  Diagnosis Date   Anorexia    Anxiety    Carotid artery occlusion    COPD (chronic obstructive pulmonary disease) (HCC)    Depression    Diverticula, colon    Gastritis    GERD (gastroesophageal reflux disease)    Hemorrhoids, external    HTN (hypertension)    Hyperlipidemia    Hyperlipidemia    Idiopathic peripheral neuropathy    chronic   MVP (mitral valve prolapse)    stable   Osteoporosis    Palpitations    Polyp of colon  Raynaud's disease     PAST SURGICAL HISTORY:  Past Surgical History:  Procedure Laterality Date   CESAREAN SECTION     x2   CHOLECYSTECTOMY     INTRAMEDULLARY (IM) NAIL INTERTROCHANTERIC Right 02/11/2014    ALLERGIES:  is allergic to lovastatin; sulfamethoxazole; darvocet [propoxyphene n-acetaminophen]; erythromycin; hydrocod polst-cpm polst er; penicillins; theophyllines; zovirax [acyclovir]; ceftin; and paroxetine hcl.   MEDICATIONS:  Current Facility-Administered Medications  Medication Dose Route Frequency Provider Last Rate Last Dose   acetaminophen  (TYLENOL) tablet 650 mg  650 mg Oral Q6H PRN Armandina Gemma, MD       Or   acetaminophen (TYLENOL) suppository 650 mg  650 mg Rectal Q6H PRN Armandina Gemma, MD       dextrose 5 % and 0.45 % NaCl with KCl 20 mEq/L infusion   Intravenous Continuous Armandina Gemma, MD 75 mL/hr at 05/29/19 0800     HYDROcodone-acetaminophen (NORCO/VICODIN) 5-325 MG per tablet 1-2 tablet  1-2 tablet Oral Q4H PRN Armandina Gemma, MD   1 tablet at 05/28/19 1606   HYDROmorphone (DILAUDID) injection 0.5 mg  0.5 mg Intravenous Q2H PRN Armandina Gemma, MD   0.5 mg at 05/28/19 1659   ondansetron (ZOFRAN-ODT) disintegrating tablet 4 mg  4 mg Oral Q6H PRN Armandina Gemma, MD       Or   ondansetron (ZOFRAN) injection 4 mg  4 mg Intravenous Q6H PRN Armandina Gemma, MD       traMADol Veatrice Bourbon) tablet 50 mg  50 mg Oral Q6H PRN Armandina Gemma, MD       zolpidem (AMBIEN) tablet 5 mg  5 mg Oral QHS PRN Wendee Beavers T, MD   5 mg at 05/29/19 0040    VITAL SIGNS: BP (!) 143/64 (BP Location: Right Arm)    Pulse 91    Temp 98.5 F (36.9 C) (Oral)    Resp 16    Ht '5\' 4"'  (1.626 m)    Wt 33.4 kg    SpO2 100%    BMI 12.64 kg/m  Filed Weights   05/28/19 1755  Weight: 33.4 kg    Estimated body mass index is 12.64 kg/m as calculated from the following:   Height as of this encounter: '5\' 4"'  (1.626 m).   Weight as of this encounter: 33.4 kg.  LABS: CBC:    Component Value Date/Time   WBC 17.9 (H) 05/28/2019 1012   HGB 12.3 05/28/2019 1012   HGB 11.9 04/15/2017 1617   HCT 37.4 05/28/2019 1012   HCT 36.4 04/15/2017 1617   PLT 345 05/28/2019 1012   PLT 332 04/15/2017 1617   Comprehensive Metabolic Panel:    Component Value Date/Time   NA 130 (L) 05/28/2019 1012   NA 130 (L) 04/15/2017 1617   K 4.1 05/28/2019 1012   CO2 26 05/28/2019 1012   BUN 9 05/28/2019 1012   BUN 8 04/15/2017 1617   CREATININE 0.43 (L) 05/28/2019 1012   ALBUMIN 3.8 05/28/2019 1012   ALBUMIN 4.6 04/08/2017 1505     Review of Systems  Musculoskeletal: Positive  for arthralgias.  Neurological: Positive for weakness.  Unless otherwise noted, a complete review of systems is negative.  Physical Exam General: NAD, frail appearing, thin Cardiovascular: regular rate and rhythm Pulmonary: clear ant fields Abdomen: soft, nontender, + bowel sounds Extremities: no edema, no joint deformities Skin: no rashes, bruising, reports of pressure sore at tailbone (not assessed) Neurological: Weakness, calm, mood appropriate, A&O x3   Prognosis: Unable to determine (Guarded) in the setting  of recurrent falls, generalized weakness, thin, multiple fractures, frail.   Discharge Planning:  Home with Hospice as patient and daughter states they are not interested in surgical interventions despite awareness of ability to better resolve pain with mobility.   Recommendations:  DNR/DNI-as confirmed by patient and daughter  Continue with current treatment plan per attending. No escalation of care, patient and daughter both declining surgical interventions.   Outpatient hospice as requested with equipment needs for bed, BSC, tray, (referral placed)  PMT will continue to support and follow    Palliative Performance Scale: PPS 20%              Patient and daughter expressed understanding and was in agreement with this plan.   Thank you for allowing the Palliative Medicine Team to assist in the care of this patient.  Time In: 1400  Time Out: 1535 Time Total: 95 min.   Visit consisted of counseling and education dealing with the complex and emotionally intense issues of symptom management and palliative care in the setting of serious and potentially life-threatening illness.Greater than 50%  of this time was spent counseling and coordinating care related to the above assessment and plan.  Signed by:  Alda Lea, AGPCNP-BC Palliative Medicine Team  Phone: [phone] Fax: [phone] Pager: [phone] Amion: Bjorn Pippin

## 2019-05-29 NOTE — Plan of Care (Signed)
  Problem: Elimination: Goal: Will not experience complications related to bowel motility Outcome: Progressing Goal: Will not experience complications related to urinary retention Outcome: Progressing   Problem: Pain Managment: Goal: General experience of comfort will improve Outcome: Progressing   Problem: Skin Integrity: Goal: Risk for impaired skin integrity will decrease Outcome: Progressing   

## 2019-05-29 NOTE — Progress Notes (Signed)
Prior to midnight last night, p t was given chicken noodle soup,strawberry Ensure,graham crackers, saltines, and gingerale.  Pt refused vanilla ice cream. Pt only ate a few bites of soup and crackers, and a few sips of gingerale and said that the food was terrible.

## 2019-05-30 DIAGNOSIS — S72002D Fracture of unspecified part of neck of left femur, subsequent encounter for closed fracture with routine healing: Secondary | ICD-10-CM

## 2019-05-30 NOTE — Consult Note (Signed)
Responded to consult, conferred with nurse, and visited with pt and daughter [name]. [name] indicated to me her mom was hard of hearing at outset, which I appreciated as I then got a little closer to pt and projected my voice to her more. Then we had a good conversation. Pt told me her name was Jenny Torres. Family Is [employer]. Pt said she'd be happy to receive communion tomorrow if a priest comes to bring it to another pt. I explained we did not yet know if he could come, but if so, would ask him to bring communion to her too. Offered spiritual/emotional support and prayer: spontaneous prayer, then the [employer], [place], [place], in which [place] and [place] joined in.. Explained pt can ask for a chaplain to come any time, and that there is a chapel open 35/7 on 3rd floor. They were appreciative.  Rev. Eloise Levels Chaplain

## 2019-05-30 NOTE — Progress Notes (Signed)
Daily Progress Note   Patient Name: Jenny Torres       Date: 05/30/2019 DOB: 1928-08-12  Age: 85 y.o. MRN#: [id number] Attending Physician: Jenny Jasper, MD Primary Care Physician: Jenny Koch, MD Admit Date: 05/28/2019  Reason for Consultation/Follow-up: Establishing goals of care  Subjective: Patient resting in bed watching [employer]. She is A&O x3. Denies pain at this time, but states it hurts bad when she is moved. Reports she did not have much of an appetite this morning, and did not eat much breakfast. Receiving PRN pain medications.   Updates discussed with Jenny Torres, [place] regarding patient now undecided about proceeding with surgical intervention. Discussed patient was insisting yesterday she was not interesting however, having surgery is not unreasonable to assist with pain management and quality of life.   On follow-up this morning patient continues to state she is not planning to have surgery. I too am beginning to wonder if patient has some underlying dementia or not understanding discussions, although in conversations she is very much appropriate and oriented. I discussed support of her decisions with a goal by all providers to do as she wishes as the patient and ensure she is receiving the best possible care. Patient verbalized understanding and appreciation of care being received. She again states "I hear what everyone is saying, but I am not having surgery. It is all in God's hands now. I am almost 89 and my time is coming to an end!" Support given. Used this opportunity to allow patient to elaborate on her statement. She states "Jenny Torres, I am no spring chicken. I have faith and if and when it is time for me to go I am ready. Just don't let me suffer!" Support given.   Patient becomes  tearful requesting for a Chaplain visit. 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 Support given and request sent for Spiritual support as she requested. No family at the bedside. Daughter shared yesterday she is a Education officer, museum and had multiple meetings today with the last one being over at 530 this afternoon. Will update and follow up tomorrow once patient and daughter have had time to discuss with Jenny Torres and further make decisions.  1422: Received a call from patient's daughter, Jenny Torres stating she came in to visit her mother. Jenny Torres verbalized she had spoken with Jenny Torres, [place] and plans to further discuss with Jenny Torres later today. Jenny Torres expressed she had spoken to Jenny Torres previously and was aware of all available options, however felt it was necessary to make sure her mother was well informed. I verbalized agreement and expressed that Jenny Torres is alert and oriented and it is important to make sure her decisions and wishes are being honored in the manner she so chooses. Explained to daughter there is no right or wrong decisions, as long as it aligns with patient's goals of care. I discussed with surgical intervention, outpatient palliative at minimum if she so chooses to proceed with SNF placement would be a recommendation at minimum. Daughter verbalized she was now concerned her mother was not eating or drinking much, having some choking concerns with pills, and inquiring about the hospice referral and set-up. I educated daughter, at this time I have placed the hospice referral on hold until we have certainty from Jenny Torres that she does wish or does not wish to proceed with surgical intervention. I expressed after further discussions with Jenny Torres and the medical team, and a definitive decision has been made all necessary referrals would be placed appropriately. Jenny Torres verbalized  understanding and appreciation.   Length of Stay: 2  Current Medications: Scheduled Meds:    Continuous Infusions:  dextrose 5 % and 0.45 % NaCl with KCl 20 mEq/L 75 mL/hr at 05/30/19 0835    PRN Meds: acetaminophen **OR** acetaminophen, HYDROcodone-acetaminophen, HYDROmorphone (DILAUDID) injection, ondansetron **OR** ondansetron (ZOFRAN) IV, traMADol, zolpidem  Physical Exam    NAD, fail, thin RRR, diminished bases A&O x3       Vital Signs: BP (!) 146/83 (BP Location: Left Arm)    Pulse 97    Temp (!) 97.5 F (36.4 C) (Oral)    Resp 16    Ht 5\' 4"  (1.626 m)    Wt 33.4 kg    SpO2 99%    BMI 12.64 kg/m  SpO2: SpO2: 99 % O2 Device: O2 Device: Room Air O2 Flow Rate:    Intake/output summary:   Intake/Output Summary (Last 24 hours) at 05/30/2019 1229 Last data filed at 05/30/2019 1118 Gross per 24 hour  Intake 360 ml  Output 2300 ml  Net -1940 ml   LBM: Last BM Date: (PTA) Baseline Weight: Weight: 33.4 kg Most recent weight: Weight: 33.4 kg       Palliative Assessment/Data: PPS 20%      Patient Active Problem List   Diagnosis Date Noted   Multiple fractures of ribs, left side, initial encounter for closed fracture 05/28/2019   Closed displaced fracture of left clavicle 05/28/2019   Closed left hip fracture ([place]) 05/28/2019   Multiple rib fractures 05/28/2019   Multiple fractures 04/23/2019   Pubic ramus fracture ([place]) 04/21/2019   Routine general medical examination at a health care facility 04/12/2018   Venous insufficiency 12/17/2017   Physical deconditioning    Dysuria 09/08/2016   Multiple closed pelvic fractures without disruption of pelvic circle (HCC) 05/08/2015   Syncope and collapse 12/02/2014   Hyponatremia 12/02/2014   Hyperlipidemia 12/02/2014   COPD (chronic obstructive pulmonary disease) ([place]) 12/02/2014   Essential hypertension 12/02/2014   Insomnia 02/18/2014   Protein-calorie malnutrition, severe ([place]) 02/12/2014   Low  vitamin B12 level 12/29/2012   Pulmonary nodule 10/21/2012   Idiopathic peripheral neuropathy  MVP (mitral valve prolapse)    Osteoporosis    Raynaud's disease 04/24/2011   Anxiety state 11/10/2007   Depression 11/10/2007    Palliative Care Assessment & Plan   Patient Profile: Palliative Care consult requested for this 85 y.o. female with multiple medical problems including COPD, GERD, hypertension, hyperlipidemia, ideopathic peripheral neuropathy, mitral valve prolapse, osteoporosis, and Raynaud's disease. Ms. Muhammad presented from [facility] living facility s/p ground level fall in her apartment. She was unable to get up and remained on the floor until assistance arrived the next morning. CT C-spine showed acute fractures of the posterior left ribs 1-4, left parietal scalp edema/hematoma, atrophy and small vessel disease. Acute fracture of the left clavicle. Chest CT showed old right rib fracture. Negative for pneumothorax. Left hip x-ray shows an acute subcapital fracture of left hip, bilateral hip osteoarthritis right worse, and remote right hip and bilateral pubic fractures healing. Since admission patient has been under the care of trauma and surgical team. She is undecided regarding surgical interventions. Palliative Medicine team consulted for goals of care.   Recommendations/Plan:  DNR/DNI  Continue with current treatment plan per attending  Patient states she is not interested in surgical interventions. Feel it is appropriate for continued discussion and education on interventions. From a Palliative standpoint surgery could be beneficial for pain control and allowing patient some mobility.   I have d/c referral for hospice until a definitive decision has been made by patient. Patient will benefit from hospice support at discharge regardless of surgical intervention or not, however, if she chooses to undergo surgery and SNF she will quality for outpatient  palliative and can later transition to hospice.   PMT will continue to support and follow.   Goals of Care and Additional Recommendations:  Limitations on Scope of Treatment: Continue with current plan of care   Code Status:    Code Status Orders  (From admission, onward)         Start     Ordered   05/28/19 1905  Do not attempt resuscitation (DNR)  Continuous    Question Answer Comment  In the event of cardiac or respiratory ARREST Do not call a code blue   In the event of cardiac or respiratory ARREST Do not perform Intubation, CPR, defibrillation or ACLS   In the event of cardiac or respiratory ARREST Use medication by any route, position, wound care, and other measures to relive pain and suffering. May use oxygen, suction and manual treatment of airway obstruction as needed for comfort.      05/28/19 1904        Code Status History    Date Active Date Inactive Code Status Order ID Comments User Context   05/28/2019 1534 05/28/2019 1904 Full Code [id number]  Armandina Gemma, MD ED   04/21/2019 2104 04/25/2019 1842 DNR [id number]  Gwynne Edinger, MD Inpatient   10/23/2017 0234 10/25/2017 1835 Full Code [id number]  Quintella Baton, MD Inpatient   09/14/2016 1818 09/17/2016 1952 DNR [id number]  [name], MD Inpatient   05/08/2015 2042 05/10/2015 1736 DNR [id number]  [name], MD Inpatient   12/02/2014 0813 12/03/2014 2135 Full Code [id number]  [name], MD Inpatient   02/13/2014 0443 02/14/2014 1956 DNR [id number]  [name], NP Inpatient   02/11/2014 2210 02/13/2014 0443 Full Code [id number]  [name], MD Inpatient   02/11/2014 2210 02/11/2014 2210 DNR [id number]  [name], MD Inpatient   Advance Care Planning Activity  Prognosis:   Unable to determine (Guarded to Poor)  Discharge Planning:  To Be Determined  Care plan was discussed with patient, patient's daughter, Jenny Torres and [place], [place].   Thank you for allowing the Palliative Medicine  Team to assist in the care of this patient.  Time 1045-1110 Time 1425-1500 Total Time: 60 min.   Greater than 50%  of this time was spent counseling and coordinating care related to the above assessment and plan.  Alda Lea, AGPCNP-BC Palliative Medicine Team  Phone: [phone]   Please contact Palliative Medicine Team phone at [phone] for questions and concerns.

## 2019-05-30 NOTE — Progress Notes (Signed)
Subjective:  Patient reports pain as moderate to severe.  Complaining of pain at the left shoulder and left hip.  When asked which one is more significant she states "I am unable to determine."  She also states that she prefers to not have surgery.  She did work with physical therapy today and get up to the chair.  She states that went okay but had some difficulty with hip pain.  She has not had much of an appetite.  She does deny chest pain, shortness of breath or nausea and vomiting.  Objective:   VITALS:   Vitals:   05/29/19 1152 05/29/19 2056 05/30/19 0545 05/30/19 1610  BP: (!) 143/64 137/77 (!) 146/83 122/80  Pulse: 91 (!) 105 97 95  Resp: 16   18  Temp: 98.5 F (36.9 C) 98.3 F (36.8 C) (!) 97.5 F (36.4 C) 97.6 F (36.4 C)  TempSrc: Oral Oral Oral Oral  SpO2: 100% 100% 99% 97%  Weight:      Height:        Left shoulder:  Bruising noted along the lateral shoulder.  She has crepitus noted through the clavicle fracture site is painful.  Otherwise no open wounds or open fracture.  Neurovascular intact.   Left hip:  Internally rotated but not short.  Tenderness with logroll.  No pain with palpation.  Neurovascular intact distal.  She does have noted symmetric peripheral neuropathy from about the tibial tubercle distal.  Lab Results  Component Value Date   WBC 17.9 (H) 05/28/2019   HGB 12.3 05/28/2019   HCT 37.4 05/28/2019   MCV 88.4 05/28/2019   PLT 345 05/28/2019   BMET    Component Value Date/Time   NA 130 (L) 05/28/2019 1012   NA 130 (L) 04/15/2017 1617   K 4.1 05/28/2019 1012   CL 93 (L) 05/28/2019 1012   CO2 26 05/28/2019 1012   GLUCOSE 119 (H) 05/28/2019 1012   BUN 9 05/28/2019 1012   BUN 8 04/15/2017 1617   CREATININE 0.43 (L) 05/28/2019 1012   CALCIUM 9.1 05/28/2019 1012   GFRNONAA >60 05/28/2019 1012   GFRAA >60 05/28/2019 1012     Assessment/Plan:     Principal Problem:   Multiple fractures of ribs, left side, initial encounter for closed  fracture Active Problems:   Closed displaced fracture of left clavicle   Closed left hip fracture (HCC)   Multiple rib fractures  -Plan for nonoperative management of the left clavicle.  She may weight-bear as tolerated with a walker to the clavicle.  The sling is only for comfort.  She may discontinue that as she already has.  We will allow overhead weightbearing at about 1 month.  -Treatment of the left hip is a bit more complicated.  She is high risk for perioperative mortality given her peripheral neuropathy and multiple falls recently and malnourishment.  That being said, the hip surgery would likely be amenable to percutaneous pins which is a less invasive procedure but nonetheless a surgery.  The surgery sometimes is used for palliation of pain.  I am not optimistic that she will regain much ambulatory function following surgery but certainly could have some decrease in hip pain.  That being said she seems to be preferring nonoperative treatment, this is quite reasonable as well given her multiple falls in the past few months and her poor overall health.  -I have attempted to call her daughter which she was not available immediately due to being in a meeting.  I will reach back out to her later this evening.  For any further physical therapy sessions I am okay with her weightbearing through the left shoulder with a walker or other assistive device.  Nicholes Stairs 05/30/2019, 5:39 PM   Geralynn Rile, MD [phone]

## 2019-05-30 NOTE — Progress Notes (Signed)
Per Saverio Danker PA-C, L UE is WBAT on walker only (otherwise NWB per Dr. Stann Mainland note) and she is to be NWB on L LE for now. Advises transfers only.   Deniece Ree PT, DPT, CBIS  Supplemental Physical Therapist [facility]    Pager [phone] Acute Rehab Office [phone]

## 2019-05-30 NOTE — Progress Notes (Addendum)
Patient ID: Jenny Torres, female   DOB: 1928/08/27, 85 y.o.   MRN: [id number]       Subjective: Patient is really unsure that she does not want a surgery.  She does not like the idea of being almost essentially bed bound for the rest of her life.  She is very undecided on how she wants to continue moving forward.  She has a lot of questions about surgery and what they may mean for her pain wise as well as mobility wise post op.  Objective: Vital signs in last 24 hours: Temp:  [97.5 F (36.4 C)-98.5 F (36.9 C)] 97.5 F (36.4 C) (09/08 0545) Pulse Rate:  [91-105] 97 (09/08 0545) Resp:  [16] 16 (09/07 1152) BP: (137-146)/(64-83) 146/83 (09/08 0545) SpO2:  [99 %-100 %] 99 % (09/08 0545) Last BM Date: (PTA)  Intake/Output from previous day: 09/07 0701 - 09/08 0700 In: 240 [P.O.:240] Out: 1450 [Urine:1450] Intake/Output this shift: Total I/O In: -  Out: 400 [Urine:400]  PE: Gen: NAD Heart: regular Lungs: CTAB Abd: soft, NT, ND, +BS Ext: left clavicular ecchymosis, moves left arm some but limited mobility in raising arm.  NVI in LLE, but some pain with movement of her leg  Lab Results:  Recent Labs    05/28/19 1012  WBC 17.9*  HGB 12.3  HCT 37.4  PLT 345   BMET Recent Labs    05/28/19 1012  NA 130*  K 4.1  CL 93*  CO2 26  GLUCOSE 119*  BUN 9  CREATININE 0.43*  CALCIUM 9.1   PT/INR No results for input(s): LABPROT, INR in the last 72 hours. CMP     Component Value Date/Time   NA 130 (L) 05/28/2019 1012   NA 130 (L) 04/15/2017 1617   K 4.1 05/28/2019 1012   CL 93 (L) 05/28/2019 1012   CO2 26 05/28/2019 1012   GLUCOSE 119 (H) 05/28/2019 1012   BUN 9 05/28/2019 1012   BUN 8 04/15/2017 1617   CREATININE 0.43 (L) 05/28/2019 1012   CALCIUM 9.1 05/28/2019 1012   PROT 7.2 05/28/2019 1012   PROT 7.6 04/08/2017 1505   ALBUMIN 3.8 05/28/2019 1012   ALBUMIN 4.6 04/08/2017 1505   AST 32 05/28/2019 1012   ALT 23 05/28/2019 1012   ALKPHOS 220 (H) 05/28/2019  1012   BILITOT 0.9 05/28/2019 1012   BILITOT 0.5 04/08/2017 1505   GFRNONAA >60 05/28/2019 1012   GFRAA >60 05/28/2019 1012   Lipase     Component Value Date/Time   LIPASE 39 10/22/2017 1928       Studies/Results: Dg Clavicle Left  Result Date: 05/28/2019 CLINICAL DATA:  Fall with bruising over the left shoulder and clavicle. EXAM: LEFT CLAVICLE - 2+ VIEWS COMPARISON:  Left shoulder 05/28/2019 and chest radiograph 04/21/2019 FINDINGS: Segmental fracture involving the left clavicle. There is a fracture in the mid left clavicle with the lateral segment displaced superiorly. There is a second fracture along the distal or lateral aspect of the left clavicle. Chronic thickening and calcifications at the left lung apex. Atherosclerotic calcifications at the aortic arch. No evidence for a large left pneumothorax. Evidence for fractures involving the left third rib. Displaced fracture involving the left fourth rib. Question a fracture of the left fifth rib. IMPRESSION: 1. Segmental left clavicle fracture. 2. Left rib fractures. Electronically Signed   By: Markus Daft M.D.   On: 05/28/2019 10:58   Ct Head Wo Contrast  Result Date: 05/28/2019 CLINICAL DATA:  Per  EMS, pt from [facility] assisted living.for fall last night. Pt has bruising and swelling to left shoulder, abrasions to left knee. Pt laid on floor from 1AM to 9AM. Pt denies loss of consciousness, did not hit head, is not on blood thinners. EXAM: CT HEAD WITHOUT CONTRAST CT CERVICAL SPINE WITHOUT CONTRAST TECHNIQUE: Multidetector CT imaging of the head and cervical spine was performed following the standard protocol without intravenous contrast. Multiplanar CT image reconstructions of the cervical spine were also generated. COMPARISON:  09/14/2016 CT head FINDINGS: CT HEAD FINDINGS Brain: There is central and cortical atrophy. Periventricular white matter changes are consistent with small vessel disease. There is no intra or extra-axial fluid  collection or mass lesion. The basilar cisterns and ventricles have a normal appearance. There is no CT evidence for acute infarction or hemorrhage. Vascular: There is minimal atherosclerotic calcification of the internal carotid arteries. No hyperdense vessels. Skull: No calvarial fracture. LEFT parietal scalp edema/hematoma. Sinuses/Orbits: Minimal mucosal thickening of the LEFT maxillary sinus. No acute fracture. Other: None CT CERVICAL SPINE FINDINGS Alignment: There is loss of cervical lordosis. This may be secondary to splinting, soft tissue injury, or positioning. Otherwise alignment is normal. Skull base and vertebrae: No acute fracture. No primary bone lesion or focal pathologic process. Soft tissues and spinal canal: No prevertebral fluid or swelling. No visible canal hematoma. Disc levels: Disc height loss particularly at C5-6, C6-7, and C7-T1. Upper chest: There is biapical pleuroparenchymal change. Acute fractures of posterior LEFT ribs 1-4. Acute fracture of the LEFT clavicle. No pneumothorax at the apex. Other: None IMPRESSION: 1. Atrophy and small vessel disease. 2. No evidence for acute intracranial abnormality. 3. LEFT parietal scalp edema/hematoma.  No underlying fracture. 4. No evidence for acute cervical spine abnormality. 5. Acute fractures of the posterior LEFT ribs 1-4. Consider CT of the chest with intravenous contrast. 6. Acute fracture of the LEFT clavicle. These results were called by telephone at the time of interpretation on 05/28/2019 at 11:01 am to provider MARGAUX VENTER , who verbally acknowledged these results. Electronically Signed   By: Nolon Nations M.D.   On: 05/28/2019 11:05   Ct Chest W Contrast  Result Date: 05/28/2019 CLINICAL DATA:  85 year old with fall and left clavicle rib fractures. Left-sided pain. EXAM: CT CHEST WITH CONTRAST TECHNIQUE: Multidetector CT imaging of the chest was performed during intravenous contrast administration. CONTRAST:  12mL OMNIPAQUE  IOHEXOL 300 MG/ML  SOLN COMPARISON:  Chest CT 10/06/2012 FINDINGS: Cardiovascular: Normal caliber of the thoracic aorta. No evidence for an aortic dissection. Atherosclerotic calcifications in the aorta. Pulmonary arteries are patent. Mediastinum/Nodes: Calcifications in the mediastinum compatible with old granulomatous disease. No enlarged chest lymph nodes. 5 mm low-density in the left thyroid lobe is likely an incidental finding in a patient of this age. No axillary lymph node enlargement. Lungs/Pleura: No pleural effusion or pneumothorax. Irregular peripheral nodules in the lungs. These findings are most prominent at the base of the right middle lobe and lingula. Some of these findings were present in the exam from 2014. Largest area of peripheral nodularity is in the lingula on sequence 7, image 82 measuring up to 1.1 cm. These patchy densities and nodularity have progressed since 2014, particularly in the left lower lobe. No significant consolidation in either lung. Upper Abdomen: Punctate low-density structure in the right kidney likely represents a cyst. No acute findings in the visualized upper abdomen. Musculoskeletal: Displaced fracture involving the mid left clavicle. Minimally displaced fracture involving the distal left clavicle. Left AC  joint is intact. Left shoulder is located. Irregularity along the posterior left third rib may be chronic and question a fracture at this location. Depressed angulated fracture of the anterior left second rib. There is probably a nondisplaced fracture involving the anterior left third rib. There is a displaced fracture in the lateral left third rib. Mildly displaced fracture of the posterior left fourth rib. Additional fracture along the lateral left fourth rib. Evidence for old right posterior rib fractures. Thoracic vertebral body heights are maintained. IMPRESSION: 1. Left clavicle fractures. 2. Acute left rib fractures involving the left second, third and fourth  ribs. Old right rib fractures. Negative for pneumothorax. 3. Scattered patchy nodular densities in lungs. Findings are suggestive for an acute on chronic infectious or inflammatory process. There has been progressive of disease since 2014. Findings could represent an atypical infectious etiology such as Mycobacterium avium intracellulare (MAI). Electronically Signed   By: Markus Daft M.D.   On: 05/28/2019 13:40   Ct Cervical Spine Wo Contrast  Result Date: 05/28/2019 CLINICAL DATA:  Per EMS, pt from [facility] assisted living.for fall last night. Pt has bruising and swelling to left shoulder, abrasions to left knee. Pt laid on floor from 1AM to 9AM. Pt denies loss of consciousness, did not hit head, is not on blood thinners. EXAM: CT HEAD WITHOUT CONTRAST CT CERVICAL SPINE WITHOUT CONTRAST TECHNIQUE: Multidetector CT imaging of the head and cervical spine was performed following the standard protocol without intravenous contrast. Multiplanar CT image reconstructions of the cervical spine were also generated. COMPARISON:  09/14/2016 CT head FINDINGS: CT HEAD FINDINGS Brain: There is central and cortical atrophy. Periventricular white matter changes are consistent with small vessel disease. There is no intra or extra-axial fluid collection or mass lesion. The basilar cisterns and ventricles have a normal appearance. There is no CT evidence for acute infarction or hemorrhage. Vascular: There is minimal atherosclerotic calcification of the internal carotid arteries. No hyperdense vessels. Skull: No calvarial fracture. LEFT parietal scalp edema/hematoma. Sinuses/Orbits: Minimal mucosal thickening of the LEFT maxillary sinus. No acute fracture. Other: None CT CERVICAL SPINE FINDINGS Alignment: There is loss of cervical lordosis. This may be secondary to splinting, soft tissue injury, or positioning. Otherwise alignment is normal. Skull base and vertebrae: No acute fracture. No primary bone lesion or focal  pathologic process. Soft tissues and spinal canal: No prevertebral fluid or swelling. No visible canal hematoma. Disc levels: Disc height loss particularly at C5-6, C6-7, and C7-T1. Upper chest: There is biapical pleuroparenchymal change. Acute fractures of posterior LEFT ribs 1-4. Acute fracture of the LEFT clavicle. No pneumothorax at the apex. Other: None IMPRESSION: 1. Atrophy and small vessel disease. 2. No evidence for acute intracranial abnormality. 3. LEFT parietal scalp edema/hematoma.  No underlying fracture. 4. No evidence for acute cervical spine abnormality. 5. Acute fractures of the posterior LEFT ribs 1-4. Consider CT of the chest with intravenous contrast. 6. Acute fracture of the LEFT clavicle. These results were called by telephone at the time of interpretation on 05/28/2019 at 11:01 am to provider MARGAUX VENTER , who verbally acknowledged these results. Electronically Signed   By: Nolon Nations M.D.   On: 05/28/2019 11:05   Ct Hip Left Wo Contrast  Result Date: 05/28/2019 CLINICAL DATA:  Left hip pain EXAM: CT OF THE LEFT HIP WITHOUT CONTRAST TECHNIQUE: Multidetector CT imaging of the left hip was performed according to the standard protocol. Multiplanar CT image reconstructions were also generated. COMPARISON:  X-ray 05/28/2019 FINDINGS: Bones/Joint/Cartilage Acute  minimally impacted subcapital fracture of the left femoral neck. No intra-articular or intertrochanteric fracture component. No significant displacement or angulation. Acute to subacute appearing fracture of the right inferior pubic ramus at site of prior healed fracture (series 2, image 63). Mild degenerative changes of the left hip joint without dislocation. Pubic symphysis is intact with chondrocalcinosis. Ligaments Suboptimally assessed by CT. Muscles and Tendons Atrophy and fatty infiltration of the included musculature. No gross tendinous abnormality. Soft tissues Induration of the subcutaneous soft tissues posterior to the  left hip. No focal hematoma or fluid collection. Moderate distention of the urinary bladder. Moderate volume of stool within the visualized colon. IMPRESSION: 1. Acute, minimally impacted subcapital fracture of the left femoral neck. 2. Acute on chronic appearing right inferior pubic ramus fracture. Electronically Signed   By: Davina Poke M.D.   On: 05/28/2019 13:19   Dg Shoulder Left  Result Date: 05/28/2019 CLINICAL DATA:  Fall with left clavicle injury EXAM: LEFT SHOULDER - 2+ VIEW COMPARISON:  None similar FINDINGS: Segmental fracture of the left clavicle involving the mid and lateral portions. The mid clavicle fracture shows apex superior angulation. Posterior left third and fourth rib fractures. More lateral rib fractures at the same levels. Rib displacement is up to moderate. No visible pneumothorax. High humeral head which may be from chronic rotator cuff arthropathy. Osteopenia. IMPRESSION: 1. Segmental clavicle fracture involving the mid and lateral portions with midportion angulation. 2. Segmental left third and fourth rib fractures. Electronically Signed   By: Monte Fantasia M.D.   On: 05/28/2019 11:04   Dg Knee Complete 4 Views Left  Result Date: 05/28/2019 CLINICAL DATA:  Left knee pain after a fall last night. Initial encounter. EXAM: LEFT KNEE - COMPLETE 4+ VIEW COMPARISON:  None. FINDINGS: No evidence of fracture, dislocation, or joint effusion. No evidence of arthropathy or other focal bone abnormality. Chondrocalcinosis noted. Soft tissues are otherwise unremarkable. IMPRESSION: No acute abnormality. Electronically Signed   By: Inge Rise M.D.   On: 05/28/2019 10:58   Dg Hip Unilat With Pelvis 2-3 Views Left  Result Date: 05/28/2019 CLINICAL DATA:  Left hip pain after a fall last night. Initial encounter. EXAM: DG HIP (WITH OR WITHOUT PELVIS) 2-3V LEFT COMPARISON:  None. FINDINGS: The patient has an acute subcapital fracture of the left hip. No other acute bony abnormality is  seen. Remote bilateral parasymphyseal pubic bone fractures are seen. The patient is status post fixation of a healed right hip fracture. No complicating feature. Right hip osteoarthritis noted. IMPRESSION: Acute subcapital fracture left hip. Remote healed right hip and bilateral pubic rami fractures. Right worse than left hip osteoarthritis. Electronically Signed   By: Inge Rise M.D.   On: 05/28/2019 10:57    Anti-infectives: Anti-infectives (From admission, onward)   Start     Dose/Rate Route Frequency Ordered Stop   05/28/19 1530  azithromycin (ZITHROMAX) 500 mg in sodium chloride 0.9 % 250 mL IVPB     500 mg 250 mL/hr over 60 Minutes Intravenous  Once 05/28/19 1524 05/29/19 0750   05/28/19 1430  vancomycin (VANCOCIN) IVPB 750 mg/150 ml premix     750 mg 150 mL/hr over 60 Minutes Intravenous  Once 05/28/19 1401 05/28/19 1702   05/28/19 1415  ceFEPIme (MAXIPIME) 2 g in sodium chloride 0.9 % 100 mL IVPB     2 g 200 mL/hr over 30 Minutes Intravenous  Once 05/28/19 1405 05/28/19 1544   05/28/19 1400  aztreonam (AZACTAM) 2 g in sodium chloride 0.9 %  100 mL IVPB  Status:  Discontinued     2 g 200 mL/hr over 30 Minutes Intravenous  Once 05/28/19 1354 05/28/19 1405       Assessment/Plan Ground level fall Left femoral neck FX - initially planned non-op management and pursuit of hospice care; however the patient is very unsure of this plan.  She would like to speak to Dr. Stann Mainland again regarding operative vs nonoperative management and what each of these may entail for the patient moving forward.  She does not like the idea of being essentially bed bound for the rest of her life.  Will have PT get her up today to the chair per Dr. Stann Mainland as if she did not have surgery this would be the only mobility she would have. Left clavicle Fx - sling prn, pain control, non-op per ortho Left 2-4 rib FXs - pain control and IS  Left knee pain - pain control  MMP - appreciate medicine's assistance FEN -  regular diet/IVFs VTE - SCDs ID - none currently  Dispo - I have spoken with Dr. Stann Mainland who will come back and see the patient again tonight after his clinic to further discuss surgery with the patient.  She is still very unsure about hospice and not having surgery to help make her more comfortable and to be able to try and move some after surgery.  I have spoken to [place], daughter, who has a meeting at 4:30, but will be here by 5:30 to hopefully get to speak to Dr. Stann Mainland.  Daughter has reasonable concerns regarding the patient's use of sleeping pills and then getting up at night without assistance.  Will await PT eval and ortho discussion again with patient to determine what they would like to do.    LOS: 2 days    Henreitta Cea , [facility] Surgery 05/30/2019, 8:36 AM Pager: [phone]

## 2019-05-30 NOTE — Progress Notes (Signed)
PT Cancellation Note  Patient Details Name: Jenny Torres MRN: [id number] DOB: 1928/06/29   Cancelled Treatment:    Reason Eval/Treat Not Completed: Other (comment) aware of L UE weight bearing status, however will need clarification on LE weight bearing status prior to PT evaluation. Have discussed this with RN and have also paged ortho PA, awaiting call back and formal guidance on LE weight bearing status.    Deniece Ree PT, DPT, CBIS  Supplemental Physical Therapist [facility]    Pager [phone] Acute Rehab Office [phone]

## 2019-05-30 NOTE — Progress Notes (Signed)
PROGRESS NOTE    KAT PLAZA  [id number] DOB: May 10, 1928 DOA: 05/28/2019 PCP: Hoyt Koch, MD   Brief Narrative:  85 y.o. female with history of COPD not on oxygen, rainouts disease, anxiety, depression, idiopathic peripheral neuropathy, recurrent fall, HTN and severe protein calorie malnutrition Presenting from independent living facility after mechanical fall and multiple fractures.  Admitted to trauma service, medical team consulted for possible atypical lung infection and comanagement of medical issues.   Assessment & Plan:   Principal Problem:   Multiple fractures of ribs, left side, initial encounter for closed fracture Active Problems:   Closed displaced fracture of left clavicle   Closed left hip fracture (HCC)   Multiple rib fractures  Patchy opacity in the lung concerning for atypical infection - Likely chronic.  No obvious evidence of infection.  She is not having any fevers, chills or any other signs that would suggest infection.  Clinically monitor this.  Case was discussed by previous provider with infectious disease.  Follow-up outpatient.  Mechanical fall leading to left-sided rib fracture second through fourth, left clavicular fracture, left hip fracture - Plans per surgical team and trauma.  Advise aggressive use of incentive spirometer.  Pain control. Plans are to meet with ortho today to discuss further treatment options, surgical vs. Non surgical.  Essential hypertension - Does not take any home medications.  Continue to monitor  Severe protein calorie malnutrition -Dietitian consult.  Prolonged QTC -Continue to monitor.  Chronic COPD -Stable.  PRN bronchodilators  Raynaud's disease -Continue home medicines  Goals of care- consulted palliative care  Subjective: Reports that her pain is controlled. Denies any chest pain or shortness of breath  Objective: Vitals:   05/29/19 2056 05/30/19 0545 05/30/19 1610 05/30/19 2033  BP: 137/77  (!) 146/83 122/80 (!) 153/88  Pulse: (!) 105 97 95 (!) 101  Resp:   18 17  Temp: 98.3 F (36.8 C) (!) 97.5 F (36.4 C) 97.6 F (36.4 C) 98.5 F (36.9 C)  TempSrc: Oral Oral Oral Oral  SpO2: 100% 99% 97% 99%  Weight:      Height:        Intake/Output Summary (Last 24 hours) at 05/30/2019 2217 Last data filed at 05/30/2019 1645 Gross per 24 hour  Intake 360 ml  Output 1800 ml  Net -1440 ml   Filed Weights   05/28/19 1755  Weight: 33.4 kg    Examination:  General exam: Alert, awake, oriented x 3 Respiratory system: crackles at bases. Respiratory effort normal. Cardiovascular system:RRR. No murmurs, rubs, gallops. Gastrointestinal system: Abdomen is nondistended, soft and nontender. No organomegaly or masses felt. Normal bowel sounds heard. Central nervous system: Alert and oriented. No focal neurological deficits. Extremities: 1+ edema bilaterally Skin: No rashes, lesions or ulcers Psychiatry: Judgement and insight appear normal. Mood & affect appropriate.      Data Reviewed:   CBC: Recent Labs  Lab 05/28/19 1012  WBC 17.9*  NEUTROABS 16.1*  HGB 12.3  HCT 37.4  MCV 88.4  PLT 123456   Basic Metabolic Panel: Recent Labs  Lab 05/28/19 1012  NA 130*  K 4.1  CL 93*  CO2 26  GLUCOSE 119*  BUN 9  CREATININE 0.43*  CALCIUM 9.1   GFR: Estimated Creatinine Clearance: 25.1 mL/min (A) (by C-G formula based on SCr of 0.43 mg/dL (L)). Liver Function Tests: Recent Labs  Lab 05/28/19 1012  AST 32  ALT 23  ALKPHOS 220*  BILITOT 0.9  PROT 7.2  ALBUMIN 3.8  No results for input(s): LIPASE, AMYLASE in the last 168 hours. No results for input(s): AMMONIA in the last 168 hours. Coagulation Profile: No results for input(s): INR, PROTIME in the last 168 hours. Cardiac Enzymes: Recent Labs  Lab 05/28/19 1012  CKTOTAL 200   BNP (last 3 results) No results for input(s): PROBNP in the last 8760 hours. HbA1C: No results for input(s): HGBA1C in the last 72 hours.  CBG: No results for input(s): GLUCAP in the last 168 hours. Lipid Profile: No results for input(s): CHOL, HDL, LDLCALC, TRIG, CHOLHDL, LDLDIRECT in the last 72 hours. Thyroid Function Tests: No results for input(s): TSH, T4TOTAL, FREET4, T3FREE, THYROIDAB in the last 72 hours. Anemia Panel: No results for input(s): VITAMINB12, FOLATE, FERRITIN, TIBC, IRON, RETICCTPCT in the last 72 hours. Sepsis Labs: No results for input(s): PROCALCITON, LATICACIDVEN in the last 168 hours.  Recent Results (from the past 240 hour(s))  SARS Coronavirus 2 [facility] order, Performed in [facility] hospital lab) Nasopharyngeal Nasopharyngeal Swab     Status: None   Collection Time: 05/28/19 11:55 AM   Specimen: Nasopharyngeal Swab  Result Value Ref Range Status   SARS Coronavirus 2 NEGATIVE NEGATIVE Final    Comment: (NOTE) If result is NEGATIVE SARS-CoV-2 target nucleic acids are NOT DETECTED. The SARS-CoV-2 RNA is generally detectable in upper and lower  respiratory specimens during the acute phase of infection. The lowest  concentration of SARS-CoV-2 viral copies this assay can detect is 250  copies / mL. A negative result does not preclude SARS-CoV-2 infection  and should not be used as the sole basis for treatment or other  patient management decisions.  A negative result may occur with  improper specimen collection / handling, submission of specimen other  than nasopharyngeal swab, presence of viral mutation(s) within the  areas targeted by this assay, and inadequate number of viral copies  (<250 copies / mL). A negative result must be combined with clinical  observations, patient history, and epidemiological information. If result is POSITIVE SARS-CoV-2 target nucleic acids are DETECTED. The SARS-CoV-2 RNA is generally detectable in upper and lower  respiratory specimens dur ing the acute phase of infection.  Positive  results are indicative of active infection with SARS-CoV-2.  Clinical   correlation with patient history and other diagnostic information is  necessary to determine patient infection status.  Positive results do  not rule out bacterial infection or co-infection with other viruses. If result is PRESUMPTIVE POSTIVE SARS-CoV-2 nucleic acids MAY BE PRESENT.   A presumptive positive result was obtained on the submitted specimen  and confirmed on repeat testing.  While 2019 novel coronavirus  (SARS-CoV-2) nucleic acids may be present in the submitted sample  additional confirmatory testing may be necessary for epidemiological  and / or clinical management purposes  to differentiate between  SARS-CoV-2 and other Sarbecovirus currently known to infect humans.  If clinically indicated additional testing with an alternate test  methodology [phone]) is advised. The SARS-CoV-2 RNA is generally  detectable in upper and lower respiratory sp ecimens during the acute  phase of infection. The expected result is Negative. Fact Sheet for Patients:  StrictlyIdeas.no Fact Sheet for Healthcare Providers: BankingDealers.co.za This test is not yet approved or cleared by the [place] FDA and has been authorized for detection and/or diagnosis of SARS-CoV-2 by FDA under an Emergency Use Authorization (EUA).  This EUA will remain in effect (meaning this test can be used) for the duration of the COVID-19 declaration under Section 564(b)(1) of  the Act, 21 U.S.C. section 360bbb-3(b)(1), unless the authorization is terminated or revoked sooner. Performed at [facility], [place] [address], [place],  [address]          Radiology Studies: No results found.      Scheduled Meds: Continuous Infusions: . dextrose 5 % and 0.45 % NaCl with KCl 20 mEq/L 75 mL/hr at 05/30/19 0835     LOS: 2 days   Time spent= 15 mins    Kathie Dike, MD [facility]  If 7PM-7AM, please contact  night-coverage [email or web address] 05/30/2019, 10:17 PM

## 2019-05-30 NOTE — Evaluation (Signed)
Physical Therapy Evaluation Patient Details Name: Jenny Torres MRN: [id number] DOB: Jun 14, 1928 Today's Date: 05/30/2019   History of Present Illness  85yo female s/p fall found in the morning and taken to the ED; found to have multiple L rib fractures, L clavicular fracture, and L subcapital hip fracture. PMH anxiety, COPD, HTN, severe B peripheral neuropathy, raynauds, and R IM nail procedure  Clinical Impression   Patient received in bed, pleasant and willing to participate with PT but often stating "I've just been waiting to die". Educated on precautions per Dr. Stann Mainland note and Saverio Danker PA-C, then able to perform bed mobility with MaxA and Mod cues to maintain NWB status L UE. Once seated at EOB she was able to maintain upright sitting with R UE support, Max cues to keep L UE NWB. Attempted sit to stand with RW, however patient unable to perform this while maintaining NWB on both L UE and LE. Switched to "bear hug" technique and patient able to maintain NWB L UE during sit to stand and also able to maintain NWB L LE during static standing and during pivot to chair with verbal cues/totalA from PT with this technique. She was positioned to comfort and left up in chair with alarm active, all needs otherwise met; nursing staff educated regarding precautions and technique for return to bed later in day. She will continue to benefit from skilled PT services in the acute setting, also recommend ST-SNF and 24/7 S/assist moving forward.     Follow Up Recommendations SNF;Supervision/Assistance - 24 hour    Equipment Recommendations  None recommended by PT(defer to next venue)    Recommendations for Other Services       Precautions / Restrictions Precautions Precautions: Fall;Other (comment) Precaution Comments: per Dr. Stann Mainland note, L UE NWB except for WBAT on RW during transfers; per Saverio Danker, L LE NWB. Transfers only right now. Restrictions Weight Bearing Restrictions: Yes LUE Weight  Bearing: Non weight bearing LLE Weight Bearing: Non weight bearing Other Position/Activity Restrictions: L UE NWB (except can be WBAT on RW during transfers only), L LE NWB at all times      Mobility  Bed Mobility Overal bed mobility: Needs Assistance Bed Mobility: Supine to Sit     Supine to sit: Max assist     General bed mobility comments: MaxA to pivot legs around and bring trunk up to upright position  Transfers Overall transfer level: Needs assistance Equipment used: 1 person hand held assist Transfers: Sit to/from [facility] Sit to Stand: Total assist Stand pivot transfers: Total assist       General transfer comment: attempted transfers with RW, patient unable to perform safely as she continued to keep attempting to bear weight through L UE; switched to bear hug technique for which she was able to hold her L LE off of the ground and avoid putting weight through L UE  Ambulation/Gait             General Gait Details: unable  Stairs            Wheelchair Mobility    Modified Rankin (Stroke Patients Only)       Balance Overall balance assessment: Needs assistance;History of Falls Sitting-balance support: Single extremity supported;Feet supported Sitting balance-Leahy Scale: Fair     Standing balance support: Single extremity supported;During functional activity Standing balance-Leahy Scale: Poor Standing balance comment: reliant on external support  Pertinent Vitals/Pain Pain Assessment: Faces Faces Pain Scale: Hurts even more Pain Location: with movement of L LE Pain Descriptors / Indicators: Aching;Guarding;Discomfort Pain Intervention(s): Limited activity within patient's tolerance;Monitored during session;Repositioned    Home Living Family/patient expects to be discharged to:: Unsure                 Additional Comments: was living at [facility] independent living  facility    Prior Function Level of Independence: Independent with assistive device(s)         Comments: RW at all times     Hand Dominance   Dominant Hand: Right    Extremity/Trunk Assessment   Upper Extremity Assessment Upper Extremity Assessment: Defer to OT evaluation    Lower Extremity Assessment Lower Extremity Assessment: Generalized weakness    Cervical / Trunk Assessment Cervical / Trunk Assessment: Kyphotic  Communication   Communication: No difficulties  Cognition Arousal/Alertness: Awake/alert Behavior During Therapy: Flat affect Overall Cognitive Status: No family/caregiver present to determine baseline cognitive functioning                                        General Comments      Exercises     Assessment/Plan    PT Assessment Patient needs continued PT services  PT Problem List Decreased strength;Decreased mobility;Decreased safety awareness;Decreased coordination;Decreased activity tolerance;Decreased knowledge of precautions;Decreased balance;Pain;Decreased knowledge of use of DME       PT Treatment Interventions DME instruction;Therapeutic activities;Gait training;Therapeutic exercise;Patient/family education;Stair training;Balance training;Functional mobility training;Neuromuscular re-education    PT Goals (Current goals can be found in the Care Plan section)  Acute Rehab PT Goals Patient Stated Goal: get out of hospital PT Goal Formulation: With patient Time For Goal Achievement: 06/13/19 Potential to Achieve Goals: Fair    Frequency Min 2X/week   Barriers to discharge Decreased caregiver support      Co-evaluation               AM-PAC PT "6 Clicks" Mobility  Outcome Measure Help needed turning from your back to your side while in a flat bed without using bedrails?: A Lot Help needed moving from lying on your back to sitting on the side of a flat bed without using bedrails?: A Lot Help needed moving to  and from a bed to a chair (including a wheelchair)?: Total Help needed standing up from a chair using your arms (e.g., wheelchair or bedside chair)?: Total Help needed to walk in hospital room?: Total Help needed climbing 3-5 steps with a railing? : Total 6 Click Score: 8    End of Session Equipment Utilized During Treatment: Gait belt Activity Tolerance: Patient limited by pain Patient left: in chair;with call bell/phone within reach;with chair alarm set Nurse Communication: Mobility status;Weight bearing status;Precautions(technique for back to bed) PT Visit Diagnosis: Unsteadiness on feet (R26.81);History of falling (Z91.81);Muscle weakness (generalized) (M62.81);Pain;Difficulty in walking, not elsewhere classified (R26.2) Pain - Right/Left: Left Pain - part of body: Leg;Shoulder    Time: 6579-0383 PT Time Calculation (min) (ACUTE ONLY): 41 min   Charges:   PT Evaluation $PT Eval Moderate Complexity: 1 Mod PT Treatments $Therapeutic Activity: 23-37 mins        Deniece Ree PT, DPT, CBIS  Supplemental Physical Therapist [place]    Pager [phone] Acute Rehab Office [phone]

## 2019-05-31 NOTE — Progress Notes (Signed)
Patient ID: Jenny Torres, female   DOB: Jan 09, 1928, 85 y.o.   MRN: [id number]       Subjective: Patient now decided upon non-op management for her hip fracture.  She is agreeable to go home with hospice care.  Feels slightly better today.  Objective: Vital signs in last 24 hours: Temp:  [97.6 F (36.4 C)-98.5 F (36.9 C)] 97.8 F (36.6 C) (09/09 0553) Pulse Rate:  [95-101] 99 (09/09 0553) Resp:  [17-18] 17 (09/08 2033) BP: (122-159)/(80-95) 159/95 (09/09 0553) SpO2:  [97 %-99 %] 99 % (09/09 0553) Last BM Date: (PTA)  Intake/Output from previous day: 09/08 0701 - 09/09 0700 In: 360 [P.O.:360] Out: 2175 [Urine:2175] Intake/Output this shift: No intake/output data recorded.  PE: Gen: NAD, laying in bed Heart: regular Lungs: CTAB Abd: soft, NT, ND, +BS Ext: moves both feet.  Left upper chest ecchymosis from clavicle  Lab Results:  Recent Labs    05/28/19 1012  WBC 17.9*  HGB 12.3  HCT 37.4  PLT 345   BMET Recent Labs    05/28/19 1012  NA 130*  K 4.1  CL 93*  CO2 26  GLUCOSE 119*  BUN 9  CREATININE 0.43*  CALCIUM 9.1   PT/INR No results for input(s): LABPROT, INR in the last 72 hours. CMP     Component Value Date/Time   NA 130 (L) 05/28/2019 1012   NA 130 (L) 04/15/2017 1617   K 4.1 05/28/2019 1012   CL 93 (L) 05/28/2019 1012   CO2 26 05/28/2019 1012   GLUCOSE 119 (H) 05/28/2019 1012   BUN 9 05/28/2019 1012   BUN 8 04/15/2017 1617   CREATININE 0.43 (L) 05/28/2019 1012   CALCIUM 9.1 05/28/2019 1012   PROT 7.2 05/28/2019 1012   PROT 7.6 04/08/2017 1505   ALBUMIN 3.8 05/28/2019 1012   ALBUMIN 4.6 04/08/2017 1505   AST 32 05/28/2019 1012   ALT 23 05/28/2019 1012   ALKPHOS 220 (H) 05/28/2019 1012   BILITOT 0.9 05/28/2019 1012   BILITOT 0.5 04/08/2017 1505   GFRNONAA >60 05/28/2019 1012   GFRAA >60 05/28/2019 1012   Lipase     Component Value Date/Time   LIPASE 39 10/22/2017 1928       Studies/Results: No results found.   Anti-infectives: Anti-infectives (From admission, onward)   Start     Dose/Rate Route Frequency Ordered Stop   05/28/19 1530  azithromycin (ZITHROMAX) 500 mg in sodium chloride 0.9 % 250 mL IVPB     500 mg 250 mL/hr over 60 Minutes Intravenous  Once 05/28/19 1524 05/29/19 0750   05/28/19 1430  vancomycin (VANCOCIN) IVPB 750 mg/150 ml premix     750 mg 150 mL/hr over 60 Minutes Intravenous  Once 05/28/19 1401 05/28/19 1702   05/28/19 1415  ceFEPIme (MAXIPIME) 2 g in sodium chloride 0.9 % 100 mL IVPB     2 g 200 mL/hr over 30 Minutes Intravenous  Once 05/28/19 1405 05/28/19 1544   05/28/19 1400  aztreonam (AZACTAM) 2 g in sodium chloride 0.9 % 100 mL IVPB  Status:  Discontinued     2 g 200 mL/hr over 30 Minutes Intravenous  Once 05/28/19 1354 05/28/19 1405       Assessment/Plan Ground level fall Left femoral neck FX - now wants non-op management and hospice care.  Appreciate Dr. Stann Mainland coming back by to speak to patient again.  Will have PCM proceed with setting up hospice care.  Left clavicle Fx - sling prn, pain control,  non-op per ortho Left 2-4 rib FXs - pain control and IS  Left knee pain - pain control  MMP - appreciate medicine's assistance FEN - regular diet/IVFs VTE - SCDs ID - none currently  dispo - arrange hospice care at home.  Once arranged, patient is stable for DC.   LOS: 3 days    Henreitta Cea , [facility] 05/31/2019, 8:06 AM Pager: [phone]

## 2019-05-31 NOTE — Progress Notes (Addendum)
[facility] Admission @ 3pm  Ms. Bacheller sustained an unwitnessed fall at her ILF on 9/5.  She laid without assistance until found the following morning.  She was transported to ED for pain, and admitted to [phone] for multiple left rib fractures, left clavicle fracture, and left hip fracture.  Hospice was not notified by facility of her admission.  She is under hospice services with a terminal diagnosis of protein calorie malnutrition per Dr. Gildardo Cranker.  She is admitted for management of left hip and clavicle fractures.  This is a related hospital admission.  Exchanged report with staff at hospital.  Patients pain is currently managed.  Spoke with daughter [name].  Decision for palliative surgical intervention was left up to Ms. Halbritter, she is clear that she does not want to have any interventions.  She understands that the surgery could make her more comfortable, but she is resolute with her decision.  She is now bed bound.  Per her hospice RN, she was mostly in the bed and had to be encouraged to eat, she was not able to safely stay in her ILF.    V/S:  97.8 oral, 159/95, HR 99, RR 18, SPO2 99% RA Lab work:  None collected since 9/6 I&O:  360/2175 Diagnostics:  L hip-Acute subcapital fracture left hip; L clavicle-Segmental left clavicle fracture; L-2nd, 3rd, 4th rib fracture Wt:  33.4 kg Diet:  She has consumed 40% of lunch on 9/8, the only solid intake for >36 hours PRNs: vicodin 5/325 PO x 3 for last 24 hours.  No current IV infusions.  Problem List: L hip and clavicle fracture--position for comfort, PRN analgesics  D/C planning:  Dtr does not want her to return to [facility] due to the visitation policy.  She feels her mother is at the EOL and would like [facility].  We will have a bed for her on 9/10. IDT:  Updated.   Family:  Updated GOC:  Clear, pt wants to avoid surgery and die in peace.  Venia Carbon RN, BSN, [facility] Liaison  (in [place]) [phone]

## 2019-05-31 NOTE — Progress Notes (Signed)
This RN could observe the daughter and patient having a disagreement from outside the patient's door. Upon entry into the room the patient was shouting, could not clearly make out what she was saying. The daughter expressed they had some concerns regarding placement for her mother and they needed to reevaluate the current plan. I sat down at the bedside with the patient to try to get a clear understanding of the request and she stated she "wanted to go somewhere her family could visit." Daughter states she feels her mother is not comfortable with [facility] and she is being "manipulative". Informed the family I would notify the physicians and social worker to see if they could reevaluate goals of care and placement options. Both the patient and daughter agreed that was an appropriate solution. Will give report to night RN to inform following day RN to page palliative and SW to help resolve this matter. Will continue to monitor.

## 2019-05-31 NOTE — TOC Initial Note (Signed)
Transition of Care [facility]) - Initial/Assessment Note    Patient Details  Name: Jenny Torres MRN: [id number] Date of Birth: 09-Sep-1928  Transition of Care [facility]) CM/SW Contact:    Alexander [place], [place] Phone Number: 05/31/2019, 11:06 AM  Clinical Narrative:                 CSW met with pt at bedside. Introduced self, role and reason for visit. CSW requested to call pt daughter and pt gave permission. Pt from [facility] where she thinks she has lived for about 3 years. She lost her husband and her son over the past two years and that has been difficult for her. Pt sad over her previous independence and discomfort; emotional support provided. Pt oriented but at one point had to be redirected that she was in the hospital.   CSW will reach out to pt daughter for plan as she has been in discussions with [facility] for [place] and requests [place] referral.   Expected Discharge Plan: Hospice Medical Facility Barriers to Discharge: Continued Medical Work up, Hospice Bed not available   Patient Goals and CMS Choice Patient states their goals for this hospitalization and ongoing recovery are:: to be comfortable CMS Medicare.gov Compare Post Acute Care list provided to:: Patient Choice offered to / list presented to : Patient, Adult Children  Expected Discharge Plan and Services Expected Discharge Plan: [place] In-house Referral: Clinical Social Work Discharge Planning Services: CM Consult Post Acute Care Choice: Hospice Living arrangements for the past 2 months: [place]   Prior Living Arrangements/Services Living arrangements for the past 2 months: [place] Lives with:: Facility Resident Patient language and need for interpreter reviewed:: Yes(no needs)        Need for Family Participation in Patient Care: Yes (Comment)(assistance with decision making; support) Care giver support system in place?: Yes (comment)(adult  daughter) Current home services: DME Criminal Activity/Legal Involvement Pertinent to Current Situation/Hospitalization: No - Comment as needed  Activities of Daily Living      Permission Sought/Granted Permission sought to share information with : Family Supports, Other (comment) Permission granted to share information with : Yes, Verbal Permission Granted  Share Information with NAME: [name]  Permission granted to share info w AGENCY: [facility]  Permission granted to share info w Relationship: daughter  Permission granted to share info w Contact Information: [phone]  Emotional Assessment Appearance:: Appears stated age Attitude/Demeanor/Rapport: Engaged, Gracious Affect (typically observed): Accepting, Adaptable, Appropriate, Quiet, Pleasant Orientation: : Oriented to Self, Oriented to Place, Oriented to  Time, Fluctuating Orientation (Suspected and/or reported Sundowners) Alcohol / Substance Use: Not Applicable Psych Involvement: No (comment)  Admission diagnosis:  Closed subcapital fracture of left femur, initial encounter ([place]) [S72.012A] Fall, initial encounter [W19.XXXA] Closed fracture of multiple ribs of left side, initial encounter [S22.42XA] Closed nondisplaced fracture of left clavicle, unspecified part of clavicle, initial encounter [S42.002A] Patient Active Problem List   Diagnosis Date Noted  . Multiple fractures of ribs, left side, initial encounter for closed fracture 05/28/2019  . Closed displaced fracture of left clavicle 05/28/2019  . Closed left hip fracture ([place]) 05/28/2019  . Multiple rib fractures 05/28/2019  . Multiple fractures 04/23/2019  . Pubic ramus fracture ([place]) 04/21/2019  . Routine general medical examination at a health care facility 04/12/2018  . Venous insufficiency 12/17/2017  . Physical deconditioning   . Dysuria 09/08/2016  . Multiple closed pelvic fractures without disruption of pelvic circle ([place]) 05/08/2015  . Syncope  and collapse 12/02/2014  . Hyponatremia 12/02/2014  . Hyperlipidemia 12/02/2014  . COPD (chronic obstructive pulmonary disease) ([place]) 12/02/2014  . Essential hypertension 12/02/2014  . Insomnia 02/18/2014  . Protein-calorie malnutrition, severe ([place]) 02/12/2014  . Low vitamin B12 level 12/29/2012  . Pulmonary nodule 10/21/2012  . Idiopathic peripheral neuropathy   . MVP (mitral valve prolapse)   . Osteoporosis   . Raynaud's disease 04/24/2011  . Anxiety state 11/10/2007  . Depression 11/10/2007   PCP:  Hoyt Koch, MD Pharmacy:   [facility], [place] - [address] [place] [address] Phone: [phone] Fax: [phone]     Social Determinants of Health (SDOH) Interventions    Readmission Risk Interventions No flowsheet data found.

## 2019-05-31 NOTE — Care Management Important Message (Signed)
Important Message  Patient Details  Name: Jenny Torres MRN: [id number] Date of Birth: [birth year]   Medicare Important Message Given:  Yes     Memory Argue 05/31/2019, 4:21 PM

## 2019-05-31 NOTE — Progress Notes (Signed)
Daily Progress Note   Patient Name: Jenny Torres       Date: 05/31/2019 DOB: 1928/08/10  Age: 85 y.o. MRN#: [id number] Attending Physician: Particia Jasper, MD Primary Care Physician: Hoyt Koch, MD Admit Date: 05/28/2019  Reason for Consultation/Follow-up: Establishing goals of care  Subjective: Patient resting in bed. When entering patient observed grimacing and moaning at times. She states she is having some pain but it is bearable. Explained she has medication ordered and we do not want her laying in bed suffering in pain if at all possible. She verbalized understanding and is requesting medications. RN notified and is preparing to administer. Jenny Torres continues to have a poor appetite. She states she did not eat much yesterday and did not want any breakfast this morning. States "I wanted just a few sips of drink, I just don't want to each much these days!" She reports she is just wants to watch tv and be comfortable "until God calls me home" Support given.   Asked patient her wishes at this point in her care and she stated, not to have surgery, be kept comfortable and allowed to rest, and have hospice support wherever. Support given. I explained that orders had been placed for hospice and they were in discussion with her daughter. She verbalized understanding stating "she will make all of the arrangements and decides what is best. I prefer to just relax and will follow instructions when it is time to leave! I am almost 89 and at this point in my life, my time to lead is up, I've done it most of my life!" Support and therapeutic listening provided.   Daughter,  [name] states after discussions with family and outside healthcare team she would now prefer [employer] versus her mother  returning to [facility]. She has already been in discussion with [name], RN [facility] hospital Liaison) and making arrangements.   Length of Stay: 3  Current Medications: Scheduled Meds:    Continuous Infusions:   PRN Meds: acetaminophen **OR** acetaminophen, HYDROcodone-acetaminophen, HYDROmorphone (DILAUDID) injection, ondansetron **OR** ondansetron (ZOFRAN) IV, traMADol, zolpidem  Physical Exam    NAD, fail, thin, poor appetite RRR, diminished bases A&O x3, generalized weakness, will follow commands        Vital Signs: BP 131/86 (BP Location: Right Arm)   Pulse 97  Temp 97.8 F (36.6 C) (Oral)   Resp 14   Ht 5\' 4"  (1.626 m)   Wt 33.4 kg   SpO2 97%   BMI 12.64 kg/m  SpO2: SpO2: 97 % O2 Device: O2 Device: Room Air O2 Flow Rate:    Intake/output summary:   Intake/Output Summary (Last 24 hours) at 05/31/2019 1627 Last data filed at 05/31/2019 0554 Gross per 24 hour  Intake -  Output 1325 ml  Net -1325 ml   LBM: Last BM Date: (PTA) Baseline Weight: Weight: 33.4 kg Most recent weight: Weight: 33.4 kg       Palliative Assessment/Data: PPS 20%      Patient Active Problem List   Diagnosis Date Noted  . Multiple fractures of ribs, left side, initial encounter for closed fracture 05/28/2019  . Closed displaced fracture of left clavicle 05/28/2019  . Closed left hip fracture ([place]) 05/28/2019  . Multiple rib fractures 05/28/2019  . Multiple fractures 04/23/2019  . Pubic ramus fracture ([place]) 04/21/2019  . Routine general medical examination at a health care facility 04/12/2018  . Venous insufficiency 12/17/2017  . Physical deconditioning   . Dysuria 09/08/2016  . Multiple closed pelvic fractures without disruption of pelvic circle ([place]) 05/08/2015  . Syncope and collapse 12/02/2014  . Hyponatremia 12/02/2014  . Hyperlipidemia 12/02/2014  . COPD (chronic obstructive pulmonary disease) ([place]) 12/02/2014  . Essential hypertension 12/02/2014  . Insomnia  02/18/2014  . Protein-calorie malnutrition, severe ([place]) 02/12/2014  . Low vitamin B12 level 12/29/2012  . Pulmonary nodule 10/21/2012  . Idiopathic peripheral neuropathy   . MVP (mitral valve prolapse)   . Osteoporosis   . Raynaud's disease 04/24/2011  . Anxiety state 11/10/2007  . Depression 11/10/2007    Palliative Care Assessment & Plan   Patient Profile: Palliative Care consult requested for this 85 y.o. female with multiple medical problems including COPD, GERD, hypertension, hyperlipidemia, ideopathic peripheral neuropathy, mitral valve prolapse, osteoporosis, and Raynaud's disease. Jenny Torres presented from [facility] living facility s/p ground level fall in her apartment. She was unable to get up and remained on the floor until assistance arrived the next morning. CT C-spine showed acute fractures of the posterior left ribs 1-4, left parietal scalp edema/hematoma, atrophy and small vessel disease. Acute fracture of the left clavicle. Chest CT showed old right rib fracture. Negative for pneumothorax. Left hip x-ray shows an acute subcapital fracture of left hip, bilateral hip osteoarthritis right worse, and remote right hip and bilateral pubic fractures healing. Since admission patient has been under the care of trauma and surgical team. She is undecided regarding surgical interventions. Palliative Medicine team consulted for goals of care.   Recommendations/Plan:  DNR/DNI  Comfort care with and expressed goal to discharge to hospice ([facility])  D/C orders not focused on comfort (telemetry, medications, interventions)  Norco PRN for pain as ordered  Dilaudid PRN for pain  Zofran PRN for nausea as ordered  PMT will continue to support and follow   Goals of Care and Additional Recommendations:  Limitations on Scope of Treatment: Full Comfort Care  Code Status:    Code Status Orders  (From admission, onward)         Start     Ordered   05/28/19  1905  Do not attempt resuscitation (DNR)  Continuous    Question Answer Comment  In the event of cardiac or respiratory ARREST Do not call a "code blue"   In the event of cardiac or respiratory ARREST Do not perform  Intubation, CPR, defibrillation or ACLS   In the event of cardiac or respiratory ARREST Use medication by any route, position, wound care, and other measures to relive pain and suffering. May use oxygen, suction and manual treatment of airway obstruction as needed for comfort.      05/28/19 1904        Code Status History    Date Active Date Inactive Code Status Order ID Comments User Context   05/28/2019 1534 05/28/2019 1904 Full Code [id number]  Armandina Gemma, MD ED   04/21/2019 2104 04/25/2019 1842 DNR [id number]  Gwynne Edinger, MD Inpatient   10/23/2017 0234 10/25/2017 1835 Full Code [id number]  Quintella Baton, MD Inpatient   09/14/2016 1818 09/17/2016 1952 DNR [id number]  [name], MD Inpatient   05/08/2015 2042 05/10/2015 1736 DNR [id number]  [name], MD Inpatient   12/02/2014 0813 12/03/2014 2135 Full Code [id number]  [name], MD Inpatient   02/13/2014 0443 02/14/2014 1956 DNR [id number]  [name], NP Inpatient   02/11/2014 2210 02/13/2014 0443 Full Code [id number]  [name], MD Inpatient   02/11/2014 2210 02/11/2014 2210 DNR [id number]  [name], MD Inpatient   Advance Care Planning Activity      Prognosis:   Poor (weeks) in the setting of full comfort care, protein calorie malnutrition, poor po intake, generalized weakness, multiple fractures (patient declining surgical intervention).   Discharge Planning:  Home with Hospice vs residential hospice   Care plan was discussed with patient, patient's daughter, [name] and [place], [place].   Thank you for allowing the Palliative Medicine Team to assist in the care of this patient.  Total Time: 45 min.   Greater than 50%  of this time was spent counseling and coordinating care related to the  above assessment and plan.  Alda Lea, AGPCNP-BC Palliative Medicine Team  Phone: [phone]   Please contact Palliative Medicine Team phone at [phone] for questions and concerns.

## 2019-05-31 NOTE — TOC Progression Note (Signed)
Transition of Care [facility]) - Progression Note    Patient Details  Name: Jenny Torres MRN: [id number] Date of Birth: August 21, 1928  Transition of Care [facility]) CM/SW [place], [place] Phone Number: 05/31/2019, 12:49 PM  Clinical Narrative:    Pt approved for [facility] transfer tomorrow. Have alerted Saverio Danker with trauma service.    Expected Discharge Plan: [place] Barriers to Discharge: Continued Medical Work up, Hospice Bed not available  Expected Discharge Plan and Services Expected Discharge Plan: [place] In-house Referral: Clinical Social Work Discharge Planning Services: CM Consult Post Acute Care Choice: Hospice Living arrangements for the past 2 months: [place]   Social Determinants of Health (SDOH) Interventions    Readmission Risk Interventions No flowsheet data found.

## 2019-05-31 NOTE — Progress Notes (Signed)
PROGRESS NOTE    Jenny Torres  [id number] DOB: 09-30-1927 DOA: 05/28/2019 PCP: Hoyt Koch, MD   Brief Narrative:  85 y.o. female with history of COPD not on oxygen, rainouts disease, anxiety, depression, idiopathic peripheral neuropathy, recurrent fall, HTN and severe protein calorie malnutrition Presenting from independent living facility after mechanical fall and multiple fractures.  Admitted to trauma service, medical team consulted for possible atypical lung infection and comanagement of medical issues.  Assessment & Plan:   Principal Problem:   Multiple fractures of ribs, left side, initial encounter for closed fracture Active Problems:   Closed displaced fracture of left clavicle   Closed left hip fracture (HCC)   Multiple rib fractures    Mechanical fall leading to left-sided rib fracture second through fourth, left clavicular fracture, left hip fracture - Plans per surgical team and trauma.  Advise aggressive use of incentive spirometer.  Pain control. Plans are to meet with ortho today to discuss further treatment options, surgical vs. Non surgical.  Essential hypertension - Does not take any home medications.  Continue to monitor  Severe protein calorie malnutrition -Dietitian consult.  Prolonged QTC -Continue to monitor.  Chronic COPD -Stable.  PRN bronchodilators  Raynaud's disease -Continue home medicines  Atypical opacification of the lung, POA-pneumonia ruled out - Likely chronic.   -Patient remains without fever, chills, leukocytosis.  No overt symptoms of infection.   -Repeat imaging in 3 to 6 months to further evaluate  Goals of care- consulted palliative care; tentative discharge with hospice pending  Subjective: No acute issues or events overnight, tolerating p.o. well, denies chest pain shortness of breath, nausea, vomiting, diarrhea, constipation, headache, fevers, chills.  Pain markedly well controlled on current regimen.   Objective: Vitals:   05/30/19 1610 05/30/19 2033 05/31/19 0553 05/31/19 1440  BP: 122/80 (!) 153/88 (!) 159/95 131/86  Pulse: 95 (!) 101 99 97  Resp: 18 17  14   Temp: 97.6 F (36.4 C) 98.5 F (36.9 C) 97.8 F (36.6 C) 97.8 F (36.6 C)  TempSrc: Oral Oral Oral Oral  SpO2: 97% 99% 99% 97%  Weight:      Height:        Intake/Output Summary (Last 24 hours) at 05/31/2019 1510 Last data filed at 05/31/2019 0554 Gross per 24 hour  Intake -  Output 1325 ml  Net -1325 ml   Filed Weights   05/28/19 1755  Weight: 33.4 kg    Examination:  General exam: Alert, awake, oriented x 3 Respiratory system: crackles at bases. Respiratory effort normal. Cardiovascular system:RRR. No murmurs, rubs, gallops. Gastrointestinal system: Abdomen is nondistended, soft and nontender. No organomegaly or masses felt. Normal bowel sounds heard. Central nervous system: Alert and oriented. No focal neurological deficits. Extremities: 1+ edema bilaterally Skin: No rashes, lesions or ulcers Psychiatry: Judgement and insight appear normal. Mood & affect appropriate.   Data Reviewed:   CBC: Recent Labs  Lab 05/28/19 1012  WBC 17.9*  NEUTROABS 16.1*  HGB 12.3  HCT 37.4  MCV 88.4  PLT 123456   Basic Metabolic Panel: Recent Labs  Lab 05/28/19 1012  NA 130*  K 4.1  CL 93*  CO2 26  GLUCOSE 119*  BUN 9  CREATININE 0.43*  CALCIUM 9.1   GFR: Estimated Creatinine Clearance: 25.1 mL/min (A) (by C-G formula based on SCr of 0.43 mg/dL (L)).   Liver Function Tests: Recent Labs  Lab 05/28/19 1012  AST 32  ALT 23  ALKPHOS 220*  BILITOT 0.9  PROT 7.2  ALBUMIN 3.8   Cardiac Enzymes: Recent Labs  Lab 05/28/19 1012  CKTOTAL 200   Recent Results (from the past 240 hour(s))  SARS Coronavirus 2 [facility] order, Performed in [facility] hospital lab) Nasopharyngeal Nasopharyngeal Swab     Status: None   Collection Time: 05/28/19 11:55 AM   Specimen: Nasopharyngeal Swab  Result Value Ref Range  Status   SARS Coronavirus 2 NEGATIVE NEGATIVE Final    Comment: (NOTE) If result is NEGATIVE SARS-CoV-2 target nucleic acids are NOT DETECTED. The SARS-CoV-2 RNA is generally detectable in upper and lower  respiratory specimens during the acute phase of infection. The lowest  concentration of SARS-CoV-2 viral copies this assay can detect is 250  copies / mL. A negative result does not preclude SARS-CoV-2 infection  and should not be used as the sole basis for treatment or other  patient management decisions.  A negative result may occur with  improper specimen collection / handling, submission of specimen other  than nasopharyngeal swab, presence of viral mutation(s) within the  areas targeted by this assay, and inadequate number of viral copies  (<250 copies / mL). A negative result must be combined with clinical  observations, patient history, and epidemiological information. If result is POSITIVE SARS-CoV-2 target nucleic acids are DETECTED. The SARS-CoV-2 RNA is generally detectable in upper and lower  respiratory specimens dur ing the acute phase of infection.  Positive  results are indicative of active infection with SARS-CoV-2.  Clinical  correlation with patient history and other diagnostic information is  necessary to determine patient infection status.  Positive results do  not rule out bacterial infection or co-infection with other viruses. If result is PRESUMPTIVE POSTIVE SARS-CoV-2 nucleic acids MAY BE PRESENT.   A presumptive positive result was obtained on the submitted specimen  and confirmed on repeat testing.  While 2019 novel coronavirus  (SARS-CoV-2) nucleic acids may be present in the submitted sample  additional confirmatory testing may be necessary for epidemiological  and / or clinical management purposes  to differentiate between  SARS-CoV-2 and other Sarbecovirus currently known to infect humans.  If clinically indicated additional testing with an alternate  test  methodology [phone]) is advised. The SARS-CoV-2 RNA is generally  detectable in upper and lower respiratory sp ecimens during the acute  phase of infection. The expected result is Negative. Fact Sheet for Patients:  StrictlyIdeas.no Fact Sheet for Healthcare Providers: BankingDealers.co.za This test is not yet approved or cleared by the [place] FDA and has been authorized for detection and/or diagnosis of SARS-CoV-2 by FDA under an Emergency Use Authorization (EUA).  This EUA will remain in effect (meaning this test can be used) for the duration of the COVID-19 declaration under Section 564(b)(1) of the Act, 21 U.S.C. section 360bbb-3(b)(1), unless the authorization is terminated or revoked sooner. Performed at [facility], [place] [address] [place], [place] [address]     Radiology Studies: No results found.   LOS: 3 days   Time spent: 20 minutes  Little Ishikawa, DO [facility]  If 7PM-7AM, please contact night-coverage [email or web address] 05/31/2019, 3:10 PM

## 2019-05-31 NOTE — Progress Notes (Signed)
On rounds on 6N. Visited with patient and prayed with her upon her request. Patient said she is not interested in communion at this time. Will continue to provide spiritual care as needed.   Rev. Eligha Bridegroom Chaplain

## 2019-05-31 NOTE — Progress Notes (Addendum)
[facility])   Jenny Torres was enrolled in hospice services at [facility] on 9/2.  Hospice was not notified of her fall on 9/6 or her hospitalization.    Left message for daughter to return call to discuss next phase of care.  RN Case Architectural technologist updated.  Detailed note to follow.  Jenny Carbon RN, BSN, [facility] Liaison (in [place]) [phone]  **update, spoke with daughter Jenny Torres, she is requesting residential hospice at [facility].  Will update hospital staff.

## 2019-06-01 ENCOUNTER — Telehealth: Payer: Self-pay | Admitting: *Deleted

## 2019-06-01 MED ORDER — HYDROCODONE-ACETAMINOPHEN 5-325 MG PO TABS: 1.0000 | ORAL_TABLET | ORAL | 0 refills | Status: AC | PRN

## 2019-06-01 NOTE — Progress Notes (Signed)
Wasted 0.5mg  Dilaudid with Suzan Garibaldi RN

## 2019-06-01 NOTE — TOC Progression Note (Signed)
Transition of Care [facility]) - Progression Note    Patient Details  Name: Jenny Torres MRN: [id number] Date of Birth: [birth year]  Transition of Care [facility]) CM/SW [place], [place] Phone Number: 06/01/2019, 8:50 AM  Clinical Narrative:    Aware of RN note from previous evening.  Pt is GIP under [facility] therefore will reach out to [facility] providers regarding disposition. [facility] does allow visitation with guidelines therefore it may be best for [facility] to discuss with family and pt.   Expected Discharge Plan: [place] Barriers to Discharge: Continued Medical Work up, Hospice Bed not available  Expected Discharge Plan and Services Expected Discharge Plan: [place] In-house Referral: Clinical Social Work Discharge Planning Services: CM Consult Post Acute Care Choice: Hospice Living arrangements for the past 2 months: [place] Expected Discharge Date: 06/01/19                  Social Determinants of Health (SDOH) Interventions    Readmission Risk Interventions No flowsheet data found.

## 2019-06-01 NOTE — Telephone Encounter (Signed)
Patient decided on nonoperative management.  Palliative care medicine was consulted and outpatient hospice care was arranged.  Patient was discharged from the hospital 06/01/19 to [facility] place for further hospice care.

## 2019-06-01 NOTE — Progress Notes (Signed)
Physician Discharge Summary  BEVIE MORTENSON [id number] DOB: [birth year] DOA: 05/28/2019  PCP: Hoyt Koch, MD  Admit date: 05/28/2019 Discharge date: 06/01/2019  Admitted From: [facility] Disposition: [facility] with hospice  Recommendations for Outpatient Follow-up:  1. Follow up with Hospice/PCP as needed  Discharge Condition: Stable CODE STATUS: DNR Diet recommendation: As tolerated  Brief/Interim Summary: 85 y.o.femalewith history of COPD not on oxygen, rainouts disease, anxiety, depression, idiopathic peripheral neuropathy, recurrent fall, HTNandsevere protein calorie malnutrition Presenting from independent living facility after mechanical fall and multiple fractures.  Admitted to trauma service, medical team consulted for possible atypical lung infection and comanagement of medical issues  Patient admitted as above after mechanical fall having noted multiple fractures most notably the left hip, left-sided rib second through fourth and left clavicular fracture.  At this time patient patient is deferring surgery and opting for nonoperative management of her fractures and pain and has since been accepted by hospice per family's wishes at [facility].  Time patient appears well, tolerating p.o., pain is well controlled and otherwise stable for discharge.  Discharge Diagnoses:  Principal Problem:   Multiple fractures of ribs, left side, initial encounter for closed fracture Active Problems:   Closed displaced fracture of left clavicle   Closed left hip fracture (HCC)   Multiple rib fractures  Discharge Instructions   Allergies as of 06/01/2019      Reactions   Lovastatin Other (See Comments)   unknown   Sulfamethoxazole Swelling   Swelling of the throat, mouth, tongue, nose   Darvocet [propoxyphene N-acetaminophen] Itching   Erythromycin Nausea And Vomiting   Upset stomach   Hydrocod Polst-cpm Polst Er Nausea Only   nausea   Penicillins Nausea And  Vomiting   "CAN TOLERATE NOW" Has patient had a PCN reaction causing immediate rash, facial/tongue/throat swelling, SOB or lightheadedness with hypotension: No Has patient had a PCN reaction causing severe rash involving mucus membranes or skin necrosis: No Has patient had a PCN reaction that required hospitalization No Has patient had a PCN reaction occurring within the last 10 years: yes If all of the above answers are "NO", then may proceed with Cephalosporin use.   Theophyllines Other (See Comments)   Unknown reaction   Zovirax [acyclovir] Other (See Comments)   Per patient lots of side effects   Ceftin Diarrhea   GI problems   Paroxetine Hcl Other (See Comments)   jittery      Medication List    TAKE these medications   feeding supplement (ENSURE ENLIVE) Liqd Take 237 mLs by mouth 2 (two) times daily between meals.   HYDROcodone-acetaminophen 5-325 MG tablet Commonly known as: NORCO/VICODIN Take 1-2 tablets by mouth every 4 (four) hours as needed for moderate pain.   zolpidem 5 MG tablet Commonly known as: AMBIEN TAKE ONE TABLET AT BEDTIME AS NEEDED FOR SLEEP What changed: See the new instructions.       Allergies  Allergen Reactions  . Lovastatin Other (See Comments)    unknown  . Sulfamethoxazole Swelling    Swelling of the throat, mouth, tongue, nose  . Darvocet [Propoxyphene N-Acetaminophen] Itching  . Erythromycin Nausea And Vomiting    Upset stomach  . Hydrocod Polst-Cpm Polst Er Nausea Only    nausea  . Penicillins Nausea And Vomiting    "CAN TOLERATE NOW" Has patient had a PCN reaction causing immediate rash, facial/tongue/throat swelling, SOB or lightheadedness with hypotension: No Has patient had a PCN reaction causing severe rash involving mucus membranes  or skin necrosis: No Has patient had a PCN reaction that required hospitalization No Has patient had a PCN reaction occurring within the last 10 years: yes If all of the above answers are "NO",  then may proceed with Cephalosporin use.   . Theophyllines Other (See Comments)    Unknown reaction  . Zovirax [Acyclovir] Other (See Comments)    Per patient lots of side effects  . Ceftin Diarrhea    GI problems  . Paroxetine Hcl Other (See Comments)    jittery    Consultations:  Orthopedic surgery  Procedures/Studies: Dg Clavicle Left  Result Date: 05/28/2019 CLINICAL DATA:  Fall with bruising over the left shoulder and clavicle. EXAM: LEFT CLAVICLE - 2+ VIEWS COMPARISON:  Left shoulder 05/28/2019 and chest radiograph 04/21/2019 FINDINGS: Segmental fracture involving the left clavicle. There is a fracture in the mid left clavicle with the lateral segment displaced superiorly. There is a second fracture along the distal or lateral aspect of the left clavicle. Chronic thickening and calcifications at the left lung apex. Atherosclerotic calcifications at the aortic arch. No evidence for a large left pneumothorax. Evidence for fractures involving the left third rib. Displaced fracture involving the left fourth rib. Question a fracture of the left fifth rib. IMPRESSION: 1. Segmental left clavicle fracture. 2. Left rib fractures. Electronically Signed   By: Markus Daft M.D.   On: 05/28/2019 10:58   Ct Head Wo Contrast  Result Date: 05/28/2019 CLINICAL DATA:  Per EMS, pt from [facility] assisted living.for fall last night. Pt has bruising and swelling to left shoulder, abrasions to left knee. Pt laid on floor from 1AM to 9AM. Pt denies loss of consciousness, did not hit head, is not on blood thinners. EXAM: CT HEAD WITHOUT CONTRAST CT CERVICAL SPINE WITHOUT CONTRAST TECHNIQUE: Multidetector CT imaging of the head and cervical spine was performed following the standard protocol without intravenous contrast. Multiplanar CT image reconstructions of the cervical spine were also generated. COMPARISON:  09/14/2016 CT head FINDINGS: CT HEAD FINDINGS Brain: There is central and cortical atrophy.  Periventricular white matter changes are consistent with small vessel disease. There is no intra or extra-axial fluid collection or mass lesion. The basilar cisterns and ventricles have a normal appearance. There is no CT evidence for acute infarction or hemorrhage. Vascular: There is minimal atherosclerotic calcification of the internal carotid arteries. No hyperdense vessels. Skull: No calvarial fracture. LEFT parietal scalp edema/hematoma. Sinuses/Orbits: Minimal mucosal thickening of the LEFT maxillary sinus. No acute fracture. Other: None CT CERVICAL SPINE FINDINGS Alignment: There is loss of cervical lordosis. This may be secondary to splinting, soft tissue injury, or positioning. Otherwise alignment is normal. Skull base and vertebrae: No acute fracture. No primary bone lesion or focal pathologic process. Soft tissues and spinal canal: No prevertebral fluid or swelling. No visible canal hematoma. Disc levels: Disc height loss particularly at C5-6, C6-7, and C7-T1. Upper chest: There is biapical pleuroparenchymal change. Acute fractures of posterior LEFT ribs 1-4. Acute fracture of the LEFT clavicle. No pneumothorax at the apex. Other: None IMPRESSION: 1. Atrophy and small vessel disease. 2. No evidence for acute intracranial abnormality. 3. LEFT parietal scalp edema/hematoma.  No underlying fracture. 4. No evidence for acute cervical spine abnormality. 5. Acute fractures of the posterior LEFT ribs 1-4. Consider CT of the chest with intravenous contrast. 6. Acute fracture of the LEFT clavicle. These results were called by telephone at the time of interpretation on 05/28/2019 at 11:01 am to provider MARGAUX VENTER , who  verbally acknowledged these results. Electronically Signed   By: Nolon Nations M.D.   On: 05/28/2019 11:05   Ct Chest W Contrast  Result Date: 05/28/2019 CLINICAL DATA:  85 year old with fall and left clavicle rib fractures. Left-sided pain. EXAM: CT CHEST WITH CONTRAST TECHNIQUE:  Multidetector CT imaging of the chest was performed during intravenous contrast administration. CONTRAST:  60mL OMNIPAQUE IOHEXOL 300 MG/ML  SOLN COMPARISON:  Chest CT 10/06/2012 FINDINGS: Cardiovascular: Normal caliber of the thoracic aorta. No evidence for an aortic dissection. Atherosclerotic calcifications in the aorta. Pulmonary arteries are patent. Mediastinum/Nodes: Calcifications in the mediastinum compatible with old granulomatous disease. No enlarged chest lymph nodes. 5 mm low-density in the left thyroid lobe is likely an incidental finding in a patient of this age. No axillary lymph node enlargement. Lungs/Pleura: No pleural effusion or pneumothorax. Irregular peripheral nodules in the lungs. These findings are most prominent at the base of the right middle lobe and lingula. Some of these findings were present in the exam from 2014. Largest area of peripheral nodularity is in the lingula on sequence 7, image 82 measuring up to 1.1 cm. These patchy densities and nodularity have progressed since 2014, particularly in the left lower lobe. No significant consolidation in either lung. Upper Abdomen: Punctate low-density structure in the right kidney likely represents a cyst. No acute findings in the visualized upper abdomen. Musculoskeletal: Displaced fracture involving the mid left clavicle. Minimally displaced fracture involving the distal left clavicle. Left AC joint is intact. Left shoulder is located. Irregularity along the posterior left third rib may be chronic and question a fracture at this location. Depressed angulated fracture of the anterior left second rib. There is probably a nondisplaced fracture involving the anterior left third rib. There is a displaced fracture in the lateral left third rib. Mildly displaced fracture of the posterior left fourth rib. Additional fracture along the lateral left fourth rib. Evidence for old right posterior rib fractures. Thoracic vertebral body heights are  maintained. IMPRESSION: 1. Left clavicle fractures. 2. Acute left rib fractures involving the left second, third and fourth ribs. Old right rib fractures. Negative for pneumothorax. 3. Scattered patchy nodular densities in lungs. Findings are suggestive for an acute on chronic infectious or inflammatory process. There has been progressive of disease since 2014. Findings could represent an atypical infectious etiology such as Mycobacterium avium intracellulare (MAI). Electronically Signed   By: Markus Daft M.D.   On: 05/28/2019 13:40   Ct Cervical Spine Wo Contrast  Result Date: 05/28/2019 CLINICAL DATA:  Per EMS, pt from [facility] assisted living.for fall last night. Pt has bruising and swelling to left shoulder, abrasions to left knee. Pt laid on floor from 1AM to 9AM. Pt denies loss of consciousness, did not hit head, is not on blood thinners. EXAM: CT HEAD WITHOUT CONTRAST CT CERVICAL SPINE WITHOUT CONTRAST TECHNIQUE: Multidetector CT imaging of the head and cervical spine was performed following the standard protocol without intravenous contrast. Multiplanar CT image reconstructions of the cervical spine were also generated. COMPARISON:  09/14/2016 CT head FINDINGS: CT HEAD FINDINGS Brain: There is central and cortical atrophy. Periventricular white matter changes are consistent with small vessel disease. There is no intra or extra-axial fluid collection or mass lesion. The basilar cisterns and ventricles have a normal appearance. There is no CT evidence for acute infarction or hemorrhage. Vascular: There is minimal atherosclerotic calcification of the internal carotid arteries. No hyperdense vessels. Skull: No calvarial fracture. LEFT parietal scalp edema/hematoma. Sinuses/Orbits: Minimal mucosal thickening  of the LEFT maxillary sinus. No acute fracture. Other: None CT CERVICAL SPINE FINDINGS Alignment: There is loss of cervical lordosis. This may be secondary to splinting, soft tissue injury, or  positioning. Otherwise alignment is normal. Skull base and vertebrae: No acute fracture. No primary bone lesion or focal pathologic process. Soft tissues and spinal canal: No prevertebral fluid or swelling. No visible canal hematoma. Disc levels: Disc height loss particularly at C5-6, C6-7, and C7-T1. Upper chest: There is biapical pleuroparenchymal change. Acute fractures of posterior LEFT ribs 1-4. Acute fracture of the LEFT clavicle. No pneumothorax at the apex. Other: None IMPRESSION: 1. Atrophy and small vessel disease. 2. No evidence for acute intracranial abnormality. 3. LEFT parietal scalp edema/hematoma.  No underlying fracture. 4. No evidence for acute cervical spine abnormality. 5. Acute fractures of the posterior LEFT ribs 1-4. Consider CT of the chest with intravenous contrast. 6. Acute fracture of the LEFT clavicle. These results were called by telephone at the time of interpretation on 05/28/2019 at 11:01 am to provider MARGAUX VENTER , who verbally acknowledged these results. Electronically Signed   By: Nolon Nations M.D.   On: 05/28/2019 11:05   Ct Hip Left Wo Contrast  Result Date: 05/28/2019 CLINICAL DATA:  Left hip pain EXAM: CT OF THE LEFT HIP WITHOUT CONTRAST TECHNIQUE: Multidetector CT imaging of the left hip was performed according to the standard protocol. Multiplanar CT image reconstructions were also generated. COMPARISON:  X-ray 05/28/2019 FINDINGS: Bones/Joint/Cartilage Acute minimally impacted subcapital fracture of the left femoral neck. No intra-articular or intertrochanteric fracture component. No significant displacement or angulation. Acute to subacute appearing fracture of the right inferior pubic ramus at site of prior healed fracture (series 2, image 63). Mild degenerative changes of the left hip joint without dislocation. Pubic symphysis is intact with chondrocalcinosis. Ligaments Suboptimally assessed by CT. Muscles and Tendons Atrophy and fatty infiltration of the  included musculature. No gross tendinous abnormality. Soft tissues Induration of the subcutaneous soft tissues posterior to the left hip. No focal hematoma or fluid collection. Moderate distention of the urinary bladder. Moderate volume of stool within the visualized colon. IMPRESSION: 1. Acute, minimally impacted subcapital fracture of the left femoral neck. 2. Acute on chronic appearing right inferior pubic ramus fracture. Electronically Signed   By: Davina Poke M.D.   On: 05/28/2019 13:19   Dg Shoulder Left  Result Date: 05/28/2019 CLINICAL DATA:  Fall with left clavicle injury EXAM: LEFT SHOULDER - 2+ VIEW COMPARISON:  None similar FINDINGS: Segmental fracture of the left clavicle involving the mid and lateral portions. The mid clavicle fracture shows apex superior angulation. Posterior left third and fourth rib fractures. More lateral rib fractures at the same levels. Rib displacement is up to moderate. No visible pneumothorax. High humeral head which may be from chronic rotator cuff arthropathy. Osteopenia. IMPRESSION: 1. Segmental clavicle fracture involving the mid and lateral portions with midportion angulation. 2. Segmental left third and fourth rib fractures. Electronically Signed   By: Monte Fantasia M.D.   On: 05/28/2019 11:04   Dg Knee Complete 4 Views Left  Result Date: 05/28/2019 CLINICAL DATA:  Left knee pain after a fall last night. Initial encounter. EXAM: LEFT KNEE - COMPLETE 4+ VIEW COMPARISON:  None. FINDINGS: No evidence of fracture, dislocation, or joint effusion. No evidence of arthropathy or other focal bone abnormality. Chondrocalcinosis noted. Soft tissues are otherwise unremarkable. IMPRESSION: No acute abnormality. Electronically Signed   By: Inge Rise M.D.   On: 05/28/2019 10:58  Dg Hip Unilat With Pelvis 2-3 Views Left  Result Date: 05/28/2019 CLINICAL DATA:  Left hip pain after a fall last night. Initial encounter. EXAM: DG HIP (WITH OR WITHOUT PELVIS) 2-3V  LEFT COMPARISON:  None. FINDINGS: The patient has an acute subcapital fracture of the left hip. No other acute bony abnormality is seen. Remote bilateral parasymphyseal pubic bone fractures are seen. The patient is status post fixation of a healed right hip fracture. No complicating feature. Right hip osteoarthritis noted. IMPRESSION: Acute subcapital fracture left hip. Remote healed right hip and bilateral pubic rami fractures. Right worse than left hip osteoarthritis. Electronically Signed   By: Inge Rise M.D.   On: 05/28/2019 10:57    Subjective: No acute issues or events overnight, pain currently well controlled, denies chest pain, shortness of breath, nausea, vomiting, diarrhea, constipation, headache, fevers, chills.   Discharge Exam: Vitals:   05/31/19 2250 06/01/19 0454  BP: (!) 167/85 (!) 176/92  Pulse: 88 99  Resp:    Temp: 97.7 F (36.5 C) 98.4 F (36.9 C)  SpO2: 98% 97%   Vitals:   05/31/19 0553 05/31/19 1440 05/31/19 2250 06/01/19 0454  BP: (!) 159/95 131/86 (!) 167/85 (!) 176/92  Pulse: 99 97 88 99  Resp:  14    Temp: 97.8 F (36.6 C) 97.8 F (36.6 C) 97.7 F (36.5 C) 98.4 F (36.9 C)  TempSrc: Oral Oral Oral Oral  SpO2: 99% 97% 98% 97%  Weight:      Height:        General:  Pleasantly resting in bed, No acute distress. HEENT:  Normocephalic atraumatic.  Sclerae nonicteric, noninjected.  Extraocular movements intact bilaterally. Neck:  Without mass or deformity.  Trachea is midline. Lungs:  Clear to auscultate bilaterally without rhonchi, wheeze, or rales. Heart:  Regular rate and rhythm.  Without murmurs, rubs, or gallops. Abdomen:  Soft, nontender, nondistended.  Without guarding or rebound. Extremities: Without cyanosis, clubbing, edema, or obvious deformity. Vascular:  Dorsalis pedis and posterior tibial pulses palpable bilaterally. Skin:  Warm and dry, no erythema, no ulcerations.   The results of significant diagnostics from this hospitalization  (including imaging, microbiology, ancillary and laboratory) are listed below for reference.     Microbiology: Recent Results (from the past 240 hour(s))  SARS Coronavirus 2 [facility] order, Performed in [facility] hospital lab) Nasopharyngeal Nasopharyngeal Swab     Status: None   Collection Time: 05/28/19 11:55 AM   Specimen: Nasopharyngeal Swab  Result Value Ref Range Status   SARS Coronavirus 2 NEGATIVE NEGATIVE Final    Comment: (NOTE) If result is NEGATIVE SARS-CoV-2 target nucleic acids are NOT DETECTED. The SARS-CoV-2 RNA is generally detectable in upper and lower  respiratory specimens during the acute phase of infection. The lowest  concentration of SARS-CoV-2 viral copies this assay can detect is 250  copies / mL. A negative result does not preclude SARS-CoV-2 infection  and should not be used as the sole basis for treatment or other  patient management decisions.  A negative result may occur with  improper specimen collection / handling, submission of specimen other  than nasopharyngeal swab, presence of viral mutation(s) within the  areas targeted by this assay, and inadequate number of viral copies  (<250 copies / mL). A negative result must be combined with clinical  observations, patient history, and epidemiological information. If result is POSITIVE SARS-CoV-2 target nucleic acids are DETECTED. The SARS-CoV-2 RNA is generally detectable in upper and lower  respiratory specimens dur ing  the acute phase of infection.  Positive  results are indicative of active infection with SARS-CoV-2.  Clinical  correlation with patient history and other diagnostic information is  necessary to determine patient infection status.  Positive results do  not rule out bacterial infection or co-infection with other viruses. If result is PRESUMPTIVE POSTIVE SARS-CoV-2 nucleic acids MAY BE PRESENT.   A presumptive positive result was obtained on the submitted specimen  and confirmed on  repeat testing.  While 2019 novel coronavirus  (SARS-CoV-2) nucleic acids may be present in the submitted sample  additional confirmatory testing may be necessary for epidemiological  and / or clinical management purposes  to differentiate between  SARS-CoV-2 and other Sarbecovirus currently known to infect humans.  If clinically indicated additional testing with an alternate test  methodology [phone]) is advised. The SARS-CoV-2 RNA is generally  detectable in upper and lower respiratory sp ecimens during the acute  phase of infection. The expected result is Negative. Fact Sheet for Patients:  StrictlyIdeas.no Fact Sheet for Healthcare Providers: BankingDealers.co.za This test is not yet approved or cleared by the [place] FDA and has been authorized for detection and/or diagnosis of SARS-CoV-2 by FDA under an Emergency Use Authorization (EUA).  This EUA will remain in effect (meaning this test can be used) for the duration of the COVID-19 declaration under Section 564(b)(1) of the Act, 21 U.S.C. section 360bbb-3(b)(1), unless the authorization is terminated or revoked sooner. Performed at [facility], [place] [address] [place], [place] [address]      Labs:  Basic Metabolic Panel: Recent Labs  Lab 05/28/19 1012  NA 130*  K 4.1  CL 93*  CO2 26  GLUCOSE 119*  BUN 9  CREATININE 0.43*  CALCIUM 9.1   Liver Function Tests: Recent Labs  Lab 05/28/19 1012  AST 32  ALT 23  ALKPHOS 220*  BILITOT 0.9  PROT 7.2  ALBUMIN 3.8   CBC: Recent Labs  Lab 05/28/19 1012  WBC 17.9*  NEUTROABS 16.1*  HGB 12.3  HCT 37.4  MCV 88.4  PLT 345   Cardiac Enzymes: Recent Labs  Lab 05/28/19 1012  CKTOTAL 200   Urinalysis    Component Value Date/Time   COLORURINE YELLOW 05/28/2019 1012   APPEARANCEUR HAZY (A) 05/28/2019 1012   LABSPEC 1.011 05/28/2019 1012   PHURINE 8.0 05/28/2019 1012   GLUCOSEU 50 (A)  05/28/2019 1012   HGBUR NEGATIVE 05/28/2019 1012   BILIRUBINUR NEGATIVE 05/28/2019 1012   BILIRUBINUR negative 04/15/2017 1507   BILIRUBINUR negative 09/08/2016 [place] 05/28/2019 1012   PROTEINUR NEGATIVE 05/28/2019 1012   UROBILINOGEN 1.0 04/15/2017 1507   UROBILINOGEN 1.0 05/31/2015 1600   NITRITE NEGATIVE 05/28/2019 1012   LEUKOCYTESUR NEGATIVE 05/28/2019 1012   Sepsis Labs Invalid input(s): PROCALCITONIN,  WBC,  LACTICIDVEN Microbiology Recent Results (from the past 240 hour(s))  SARS Coronavirus 2 [facility] order, Performed in [facility] hospital lab) Nasopharyngeal Nasopharyngeal Swab     Status: None   Collection Time: 05/28/19 11:55 AM   Specimen: Nasopharyngeal Swab  Result Value Ref Range Status   SARS Coronavirus 2 NEGATIVE NEGATIVE Final    Comment: (NOTE) If result is NEGATIVE SARS-CoV-2 target nucleic acids are NOT DETECTED. The SARS-CoV-2 RNA is generally detectable in upper and lower  respiratory specimens during the acute phase of infection. The lowest  concentration of SARS-CoV-2 viral copies this assay can detect is 250  copies / mL. A negative result does not preclude SARS-CoV-2 infection  and should not be used as the sole basis for treatment or other  patient management decisions.  A negative result may occur with  improper specimen collection / handling, submission of specimen other  than nasopharyngeal swab, presence of viral mutation(s) within the  areas targeted by this assay, and inadequate number of viral copies  (<250 copies / mL). A negative result must be combined with clinical  observations, patient history, and epidemiological information. If result is POSITIVE SARS-CoV-2 target nucleic acids are DETECTED. The SARS-CoV-2 RNA is generally detectable in upper and lower  respiratory specimens dur ing the acute phase of infection.  Positive  results are indicative of active infection with SARS-CoV-2.  Clinical  correlation with  patient history and other diagnostic information is  necessary to determine patient infection status.  Positive results do  not rule out bacterial infection or co-infection with other viruses. If result is PRESUMPTIVE POSTIVE SARS-CoV-2 nucleic acids MAY BE PRESENT.   A presumptive positive result was obtained on the submitted specimen  and confirmed on repeat testing.  While 2019 novel coronavirus  (SARS-CoV-2) nucleic acids may be present in the submitted sample  additional confirmatory testing may be necessary for epidemiological  and / or clinical management purposes  to differentiate between  SARS-CoV-2 and other Sarbecovirus currently known to infect humans.  If clinically indicated additional testing with an alternate test  methodology [phone]) is advised. The SARS-CoV-2 RNA is generally  detectable in upper and lower respiratory sp ecimens during the acute  phase of infection. The expected result is Negative. Fact Sheet for Patients:  StrictlyIdeas.no Fact Sheet for Healthcare Providers: BankingDealers.co.za This test is not yet approved or cleared by the [place] FDA and has been authorized for detection and/or diagnosis of SARS-CoV-2 by FDA under an Emergency Use Authorization (EUA).  This EUA will remain in effect (meaning this test can be used) for the duration of the COVID-19 declaration under Section 564(b)(1) of the Act, 21 U.S.C. section 360bbb-3(b)(1), unless the authorization is terminated or revoked sooner. Performed at [facility], [place] [address] [place], [place] [address]      Time coordinating discharge: Over 30 minutes  SIGNED:   Little Ishikawa, DO [facility] 06/01/2019, 8:06 AM Pager   If 7PM-7AM, please contact night-coverage [email or web address] Password [email or web address]

## 2019-06-01 NOTE — Progress Notes (Signed)
[facility] notified BP 176/92 hr 99. Arthor Captain LPN

## 2019-06-01 NOTE — TOC Transition Note (Signed)
Transition of Care (TOC) - CM/SW Discharge Note   Patient Details  Name: Jenny Torres MRN: [id number] Date of Birth: 01/05/1928  Transition of Care (TOC) CM/SW Contact:   H , LCSWA Phone Number: 06/01/2019, 1:07 PM   Clinical Narrative:    CSW met with pt at bedside; reintroduced self, role, reason for visit. We discussed care planning, the importance of safety regardless of where pt goes to. Since pt is GIP CSW asked pt if it would be alright to call Sherry with [facility]. Pt consented; we called Sherry and discussed why [facility] had been recommended. We discussed alternate placement options: that unfortunately pt requires too much assistance to return to Independent Living and we feel she would not be comfortable there, we also discussed SNF placement and that pt would not be able to maintain SNF benefits and hospice meaning she likely would private pay. Pt family is important to pt and she would like to be somewhere were her family can visit her. We discussed that pt would like for her priest to come and see her and that this can happen at [facility]. Pt states that she is okay with [facility] and that she wants to be comfortable. We called pt daughter [name] and CSW facilitated a discussion between pt and pt daughter. They both value comfort and time together- they are both in agreement for [facility] transfer today.   PTAR called for 2pm; pt daughter will complete paperwork and is amenable to this transfer.  All questions answered, DNR signed by Dr. Lancaster, PTAR papers sent to floor for pt to discharge.   Final next level of care: Hospice Medical Facility Barriers to Discharge: Barriers Resolved   Patient Goals and CMS Choice Patient states their goals for this hospitalization and ongoing recovery are:: to be comfortable CMS Medicare.gov Compare Post Acute Care list provided to:: Patient Choice offered to / list presented to : Patient, Adult  Children  Discharge Placement         Patient chooses bed at: Other - please specify in the comment section below:([facility]) Patient to be transferred to facility by: [facility] Name of family member notified: pt daughter [name] via telephone Patient and family notified of of transfer: 06/01/19  Discharge Plan and Services In-house Referral: Clinical Social Work Discharge Planning Services: CM Consult Post Acute Care Choice: Hospice              Social Determinants of Health (SDOH) Interventions     Readmission Risk Interventions No flowsheet data found.     

## 2019-06-01 NOTE — Social Work (Signed)
Clinical Social Worker facilitated patient discharge including contacting patient family and facility to confirm patient discharge plans.  Clinical information faxed to facility and family agreeable with plan.  CSW arranged ambulance transport via PT[facility] to [facility] RN to call [phone]  with report prior to discharge.  Clinical Social Worker will sign off for now as social work intervention is no longer needed. Please consult us again if new need arises.  Jeanet Lupe, MSW, LCSWA Clinical Social Worker [phone]  

## 2019-06-01 NOTE — Progress Notes (Signed)
Patient discharged to [facility]. Patient belongings and AVS summary transported with patient via [place].  Patient  transported off unit by PTAR at 1430.

## 2019-06-01 NOTE — Discharge Summary (Signed)
Patient ID: Jenny Torres [id number] 05-Feb-1928 85 y.o.  Admit date: 05/28/2019 Discharge date: 06/01/2019  Admitting Diagnosis: Fall on level ground Prolonged time on ground, unable to ambulate Multiple left rib fractures (2,3,4) Left clavicle fracture Left hip fracture Patchy inflammatory changes to lungs, chronic, rule out atypical pneumonia  Discharge Diagnosis Patient Active Problem List   Diagnosis Date Noted   Multiple fractures of ribs, left side, initial encounter for closed fracture 05/28/2019   Closed displaced fracture of left clavicle 05/28/2019   Closed left hip fracture ([place]) 05/28/2019   Multiple rib fractures 05/28/2019   Multiple fractures 04/23/2019   Pubic ramus fracture ([place]) 04/21/2019   Routine general medical examination at a health care facility 04/12/2018   Venous insufficiency 12/17/2017   Physical deconditioning    Dysuria 09/08/2016   Multiple closed pelvic fractures without disruption of pelvic circle ([place]) 05/08/2015   Syncope and collapse 12/02/2014   Hyponatremia 12/02/2014   Hyperlipidemia 12/02/2014   COPD (chronic obstructive pulmonary disease) ([place]) 12/02/2014   Essential hypertension 12/02/2014   Insomnia 02/18/2014   Protein-calorie malnutrition, severe ([place]) 02/12/2014   Low vitamin B12 level 12/29/2012   Pulmonary nodule 10/21/2012   Idiopathic peripheral neuropathy    MVP (mitral valve prolapse)    Osteoporosis    Raynaud's disease 04/24/2011   Anxiety state 11/10/2007   Depression 11/10/2007    Consultants Internal Medicine Dr. Stann Mainland, ortho Palliative Care Medicine  Reason for Admission: Patient is an 85 year old female resident of [facility].  Patient apparently had a fall in her apartment during the night on level ground.  She was unable to get up.  She was discovered this morning and transported by EMS to the emergency department for evaluation.  Patient  was found to have evidence of multiple left rib fractures, ribs 2 3 and 4.  She did not have a pneumothorax.  Patient also has a comminuted left clavicular fracture.  Further imaging demonstrated a subcapital left hip fracture.  There is evidence on scans of recent pelvic fractures.  There are patchy infiltrates in the lung likely representing an ongoing inflammatory process.  Patient was evaluated by the emergency department staff at [facility].  They contacted the trauma service at [facility] as well as the orthopedic surgical service and the hospitalist.  Patient is now seen by general surgery in order to prepare for transfer to the trauma service at [facility].  Patient's daughter is at the bedside.  She explains that the patient has recently been evaluated by palliative care.  She is being evaluated for hospice programs.  They are unsure as to how aggressive they wish to be with management of the patient's fractures.  I have asked them to discuss this further with orthopedic surgery when they are evaluated by the orthopedic surgeon.  Procedures None  Hospital Course:  The patient was admitted and ortho was consulted for her hip fracture.  The patient initially declined any operative intervention and wanted hospice care.  She then wavered a bit in this decision and respoke to ortho.  She then firmly decided on nonoperative management.  Palliative care medicine was consulted and outpatient hospice care was arranged.  She received pain control for her rib fractures.  The hospitalist were consulted initially to help with her chronic medical problems which remained stable.  On HD 5, the patient was stable for DC to [facility] place for further hospice care.    Physical Exam: Gen: NAD,  but clearly saddened today by her disposition and seems a bit confused asking some repetitive questions Heart: regular Lungs: CTAB Abd: soft, NT, ND, +BS Ext: MAE, but limited due to injury to left clavicle and left  hip  Allergies as of 06/01/2019      Reactions   Lovastatin Other (See Comments)   unknown   Sulfamethoxazole Swelling   Swelling of the throat, mouth, tongue, nose   Darvocet [propoxyphene N-acetaminophen] Itching   Erythromycin Nausea And Vomiting   Upset stomach   Hydrocod Polst-cpm Polst Er Nausea Only   nausea   Penicillins Nausea And Vomiting   "CAN TOLERATE NOW" Has patient had a PCN reaction causing immediate rash, facial/tongue/throat swelling, SOB or lightheadedness with hypotension: No Has patient had a PCN reaction causing severe rash involving mucus membranes or skin necrosis: No Has patient had a PCN reaction that required hospitalization No Has patient had a PCN reaction occurring within the last 10 years: yes If all of the above answers are "NO", then may proceed with Cephalosporin use.   Theophyllines Other (See Comments)   Unknown reaction   Zovirax [acyclovir] Other (See Comments)   Per patient lots of side effects   Ceftin Diarrhea   GI problems   Paroxetine Hcl Other (See Comments)   jittery      Medication List    TAKE these medications   feeding supplement (ENSURE ENLIVE) Liqd Take 237 mLs by mouth 2 (two) times daily between meals.   HYDROcodone-acetaminophen 5-325 MG tablet Commonly known as: NORCO/VICODIN Take 1-2 tablets by mouth every 4 (four) hours as needed for moderate pain.   zolpidem 5 MG tablet Commonly known as: AMBIEN TAKE ONE TABLET AT BEDTIME AS NEEDED FOR SLEEP What changed: See the new instructions.       Follow up: [place]   Signed: Saverio Danker, [facility] Surgery 06/01/2019, 8:10 AM Pager: [phone]

## 2019-06-01 NOTE — Progress Notes (Signed)
PALLIATIVE NOTE:  Patient awake and watching tv. When I entered she smiled, waving for me to come in and calling me by name. She denied shortness of breath or pain. Again stating she is comfortable until she has to be moved. Appetite remains poor. Breakfast tray at the bedside, it appears she may have eaten a few spoonfuls and drank a few sips of her coffee according to her. No family is at the bedside.   I received a message this morning from the nurse that daughter was on the way to the hospital to have further discussions regarding disposition with concerns that her mother is upset with her about disposition. I have reviewed notes from RN regarding event on yesterday evening.   I asked patient at the bedside to summarize her understanding regarding her plan of care without prompting or providing any information initially. Patient immediately states "we have been talking about this all week, Nikki. I though I was supposed to leave today to go to the hospice house, [employer] given. I asked patient if she was aware of what the goals and missions of [facility] were to evaluate her understanding. Ms. Lage reports "yes I know, I told you I worked heavily with them and the community. I was not on the board although I could have been and I am well known to the organization. I know people go there for their last days!" I confirmed patient's statement. I re-educated patient in detail regarding hospice's goals and care. Patient verbalizes understanding smiling and telling me I did not have to do all that she knew, I explained I wanted to make sure she was aware and that we were carrying out her wishes. She verbalized that her wish was to go to [facility] while asking what time would she go. I informed her I was unsure but most likely early afternoon and that I would make sure the RN pre-medicated for comfort and try to reduce any discomfort and pain associated with the transfer. She verbalized appreciation.    I also called her daughter at the bedside to further discuss patient and I discussion. Daughter verbalized awareness stating she had spoken to her mother earlier who told her she was in agreement with [employer]. [name], states she is more at ease with her decision for [facility] knowing this is what her mother does indeed want.   Updates provided to RN who is at the bedside. Patient is scheduled to d/c today to [facility] as requested by her and her daughter.   All questions answered to the best of my ability.   Assessment -awake, alert & oriented x3, NAD RRR, Clear bilaterally  Plan -DNR/DNI -Transfer to [facility] today  Total Time: 45 min.   Greater than 50%  of this time was spent counseling and coordinating care related to the above assessment and plan.  Alda Lea, AGPCNP-BC Palliative Medicine Team

## 2019-06-01 NOTE — Progress Notes (Signed)
Olla and gave report to [employer]. Patient to leave unit via PTAR at 2 p.m.

## 2019-06-22 DEATH — deceased

## 2019-07-24 ENCOUNTER — Telehealth: Payer: Self-pay

## 2019-07-24 NOTE — Telephone Encounter (Signed)
Copied from [place] [phone]. Topic: General - Other >> Jul 24, 2019 11:23 AM Ivar Drape wrote: Reason for CRM: Chavon w/[facility] stated that they sent an order over 05/25/2019 and 07/17/2019 and they have not received it back. She would like a return call from [facility]

## 2019-07-24 NOTE — Telephone Encounter (Signed)
Informed I have not seen the forms will refax to [place]

## 2019-10-28 IMAGING — CT CT CHEST W/ CM
2 of 3 series · 15 of 36 positions shown, 18 images · IV contrast (omnipaque)
Comparison: Chest CT 10/06/2012

CLINICAL DATA: 89-year-old with fall and left clavicle rib
fractures. Left-sided pain.

EXAM:
CT CHEST WITH CONTRAST
TECHNIQUE: Multidetector CT imaging of the chest was performed during
intravenous contrast administration.
CONTRAST:  75mL OMNIPAQUE IOHEXOL 300 MG/ML  SOLN

[Series 2: axial st · axial · 0.58mm/px · z∈[-310,-46]mm · 12 of 156 slices shown, 15 images]
[im 12/156  mediastinal]
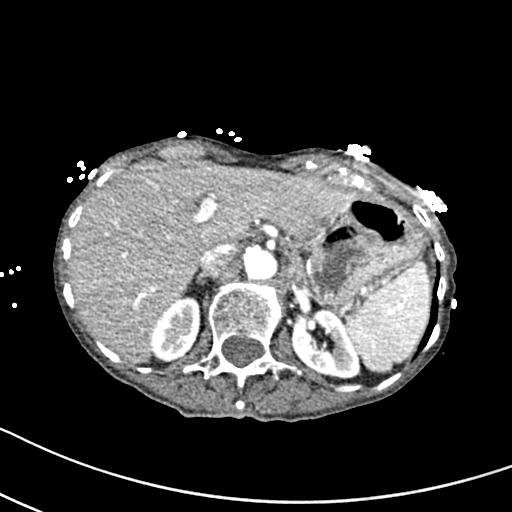
[im 12/156  lung]
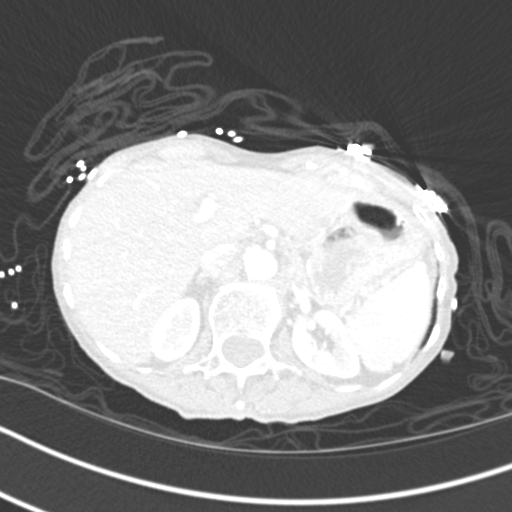
[im 23/156  lung]
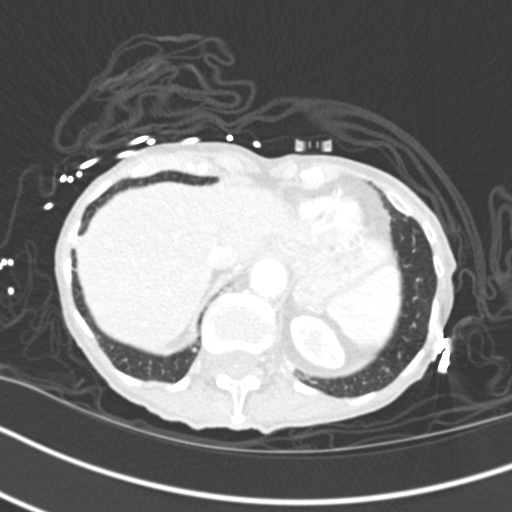
[im 35/156  lung]
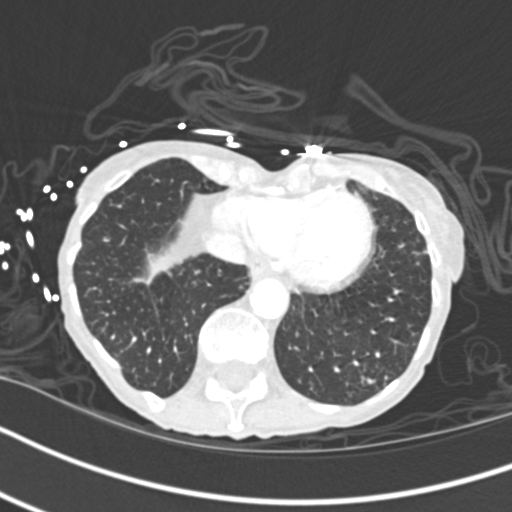
[im 46/156  lung]
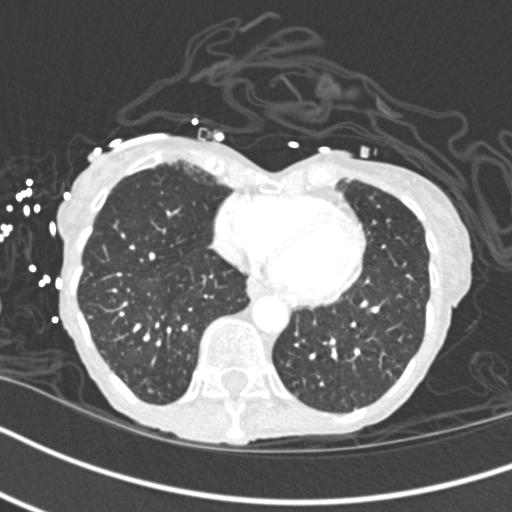
[im 58/156  mediastinal]
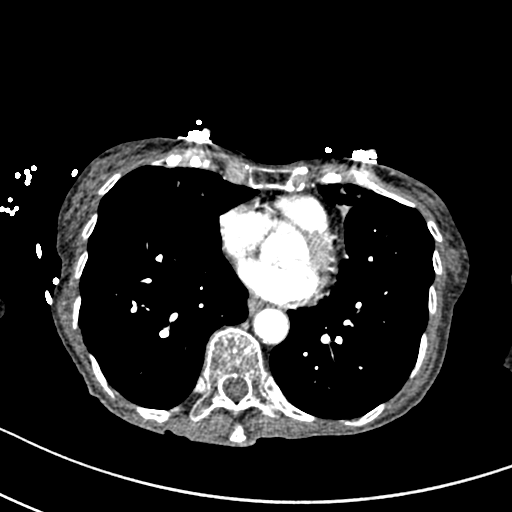
[im 58/156  lung]
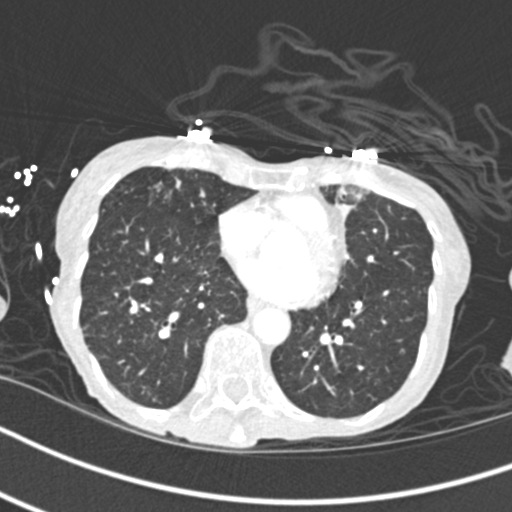
[im 69/156  lung]
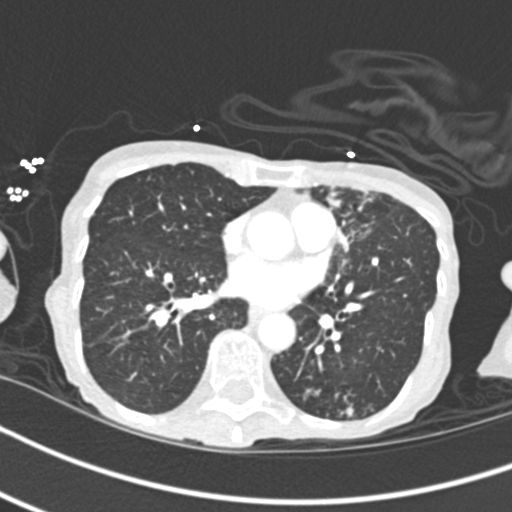
[im 87/156  lung]
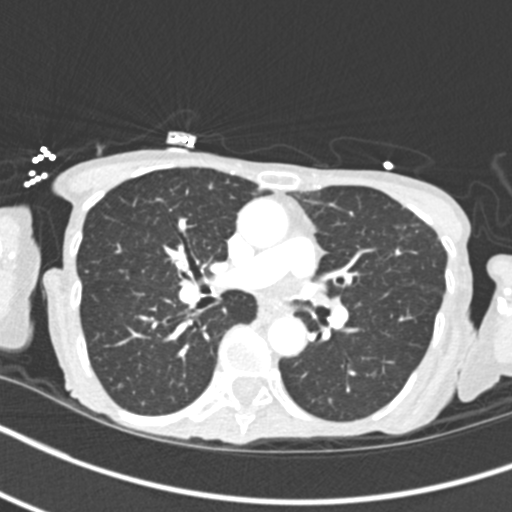
[im 98/156  lung]
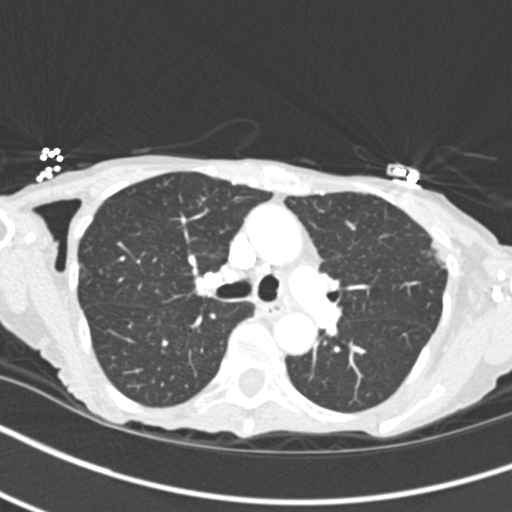
[im 110/156  mediastinal]
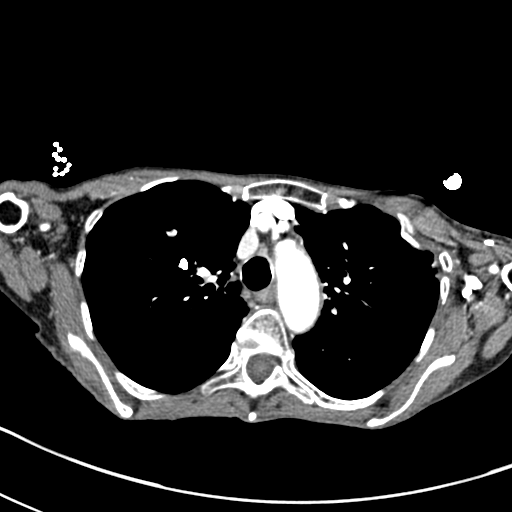
[im 110/156  lung]
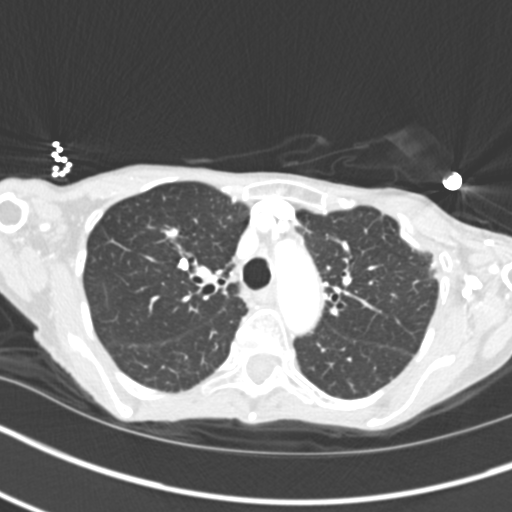
[im 121/156  lung]
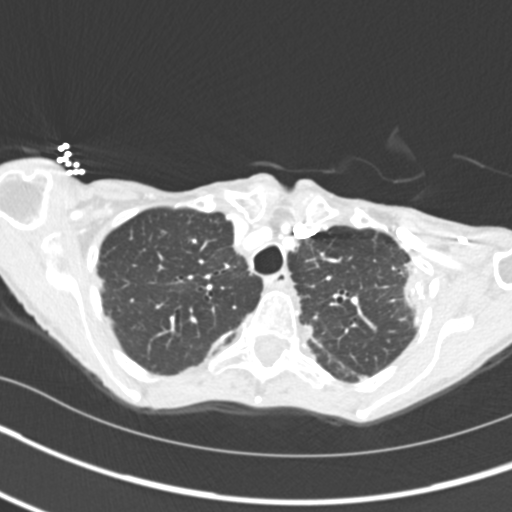
[im 133/156  lung]
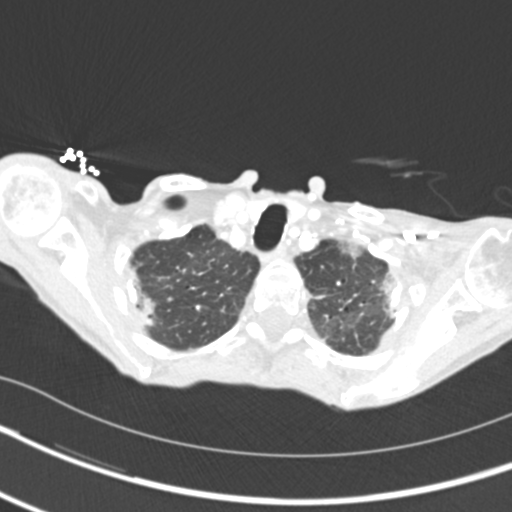
[im 144/156  lung]
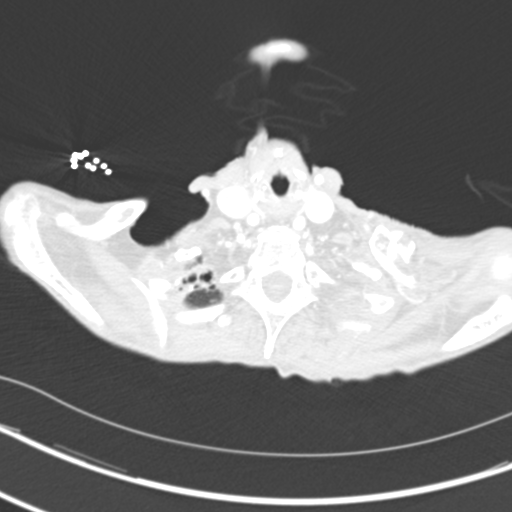

[Series 5: coronal · coronal · 0.67mm/px · 3 of 88 slices shown]
[im 18/88  lung]
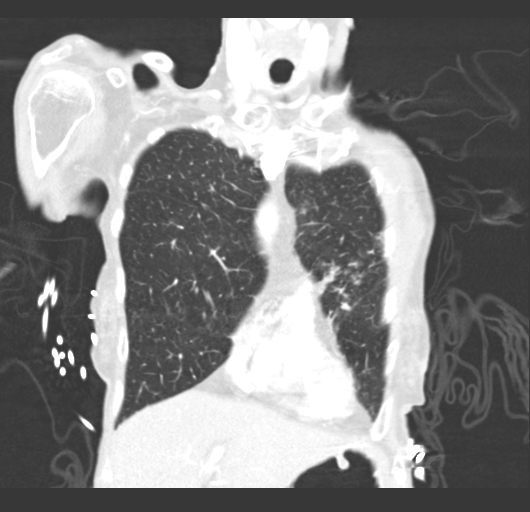
[im 35/88  lung]
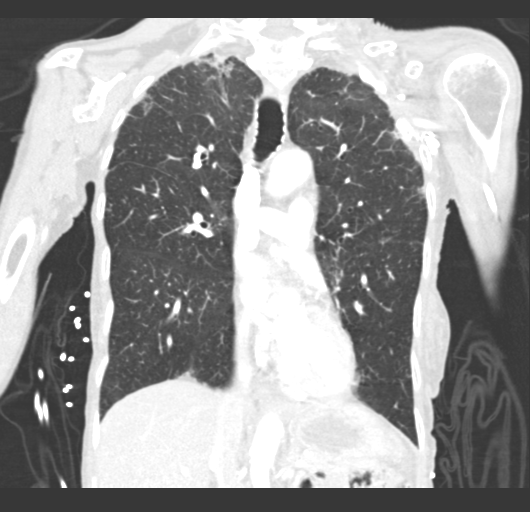
[im 53/88  lung]
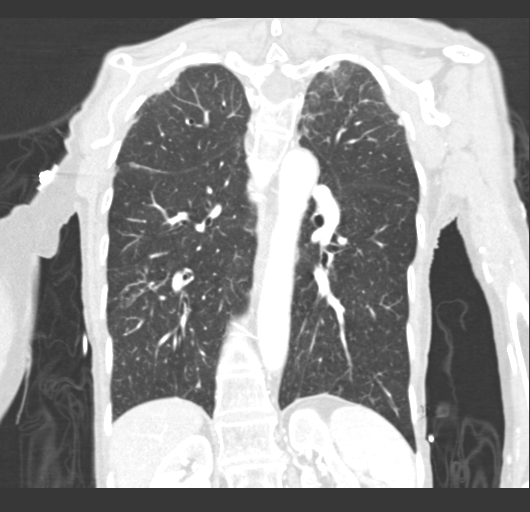

[15 of 36 positions shown; findings below may reference images not displayed]

FINDINGS: Cardiovascular: Normal caliber of the thoracic aorta. No evidence
for an aortic dissection. Atherosclerotic calcifications in the
aorta. Pulmonary arteries are patent.

Mediastinum/Nodes: Calcifications in the mediastinum compatible with
old granulomatous disease. No enlarged chest lymph nodes. 5 mm
low-density in the left thyroid lobe is likely an incidental finding
in a patient of this age. No axillary lymph node enlargement.

Lungs/Pleura: No pleural effusion or pneumothorax. Irregular
peripheral nodules in the lungs. These findings are most prominent
at the base of the right middle lobe and lingula. Some of these
findings were present in the exam from 8537. Largest area of
peripheral nodularity is in the lingula on sequence 7, image 82
measuring up to 1.1 cm. These patchy densities and nodularity have
progressed since 8537, particularly in the left lower lobe. No
significant consolidation in either lung.

Upper Abdomen: Punctate low-density structure in the right kidney
likely represents a cyst. No acute findings in the visualized upper
abdomen.

Musculoskeletal: Displaced fracture involving the mid left clavicle.
Minimally displaced fracture involving the distal left clavicle.
Left AC joint is intact. Left shoulder is located. Irregularity
along the posterior left third rib may be chronic and question a
fracture at this location. Depressed angulated fracture of the
anterior left second rib. There is probably a nondisplaced fracture
involving the anterior left third rib. There is a displaced fracture
in the lateral left third rib. Mildly displaced fracture of the
posterior left fourth rib. Additional fracture along the lateral
left fourth rib. Evidence for old right posterior rib fractures.
Thoracic vertebral body heights are maintained.
IMPRESSION: 1. Left clavicle fractures.
2. Acute left rib fractures involving the left second, third and
fourth ribs. Old right rib fractures. Negative for pneumothorax.
3. Scattered patchy nodular densities in lungs. Findings are
suggestive for an acute on chronic infectious or inflammatory
process. There has been progressive of disease since 8537. Findings
could represent an atypical infectious etiology such as
Mycobacterium avium intracellulare (ELISA EDUARDO).

## 2019-10-28 IMAGING — CT CT HIP*L* W/O CM
2 of 3 series · 16 of 46 positions shown, 18 images · non-contrast
Comparison: X-ray 05/28/2019

CLINICAL DATA: Left hip pain

EXAM:
CT OF THE LEFT HIP WITHOUT CONTRAST
TECHNIQUE: Multidetector CT imaging of the left hip was performed according to
the standard protocol. Multiplanar CT image reconstructions were
also generated.

[Series 3: axial st · axial · 0.38mm/px · z∈[-293,-69]mm · 13 of 130 slices shown, 15 images]
[im 9/130  soft-tissue]
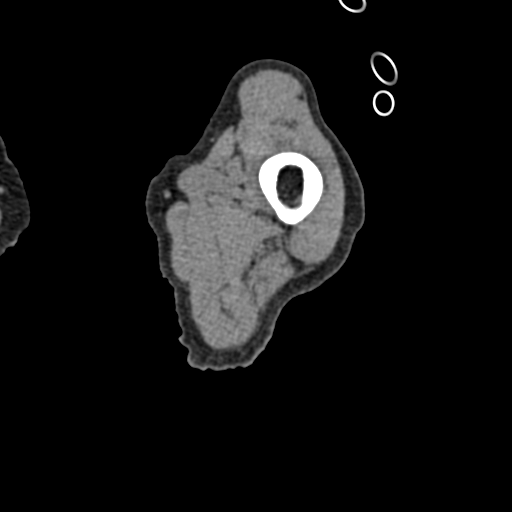
[im 9/130  bone]
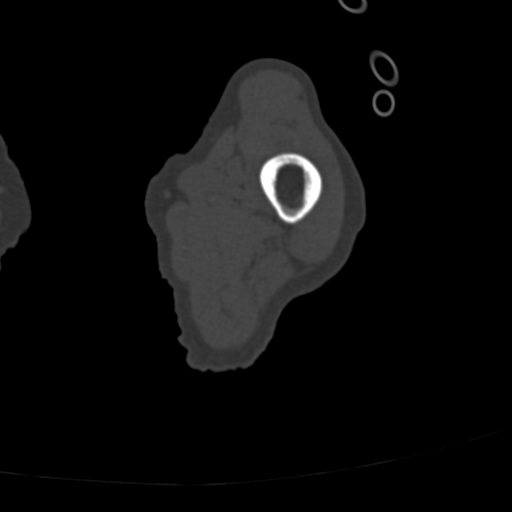
[im 17/130  soft-tissue]
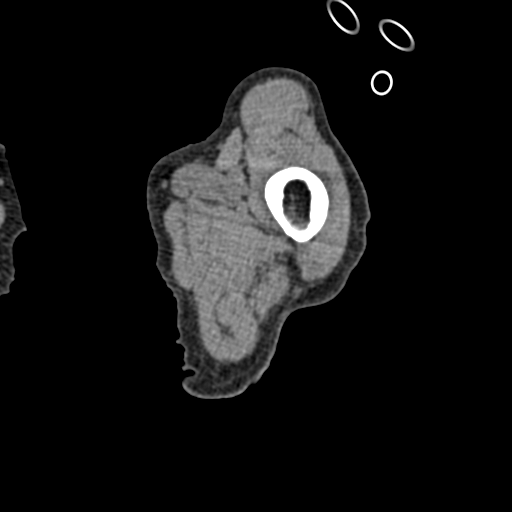
[im 25/130  soft-tissue]
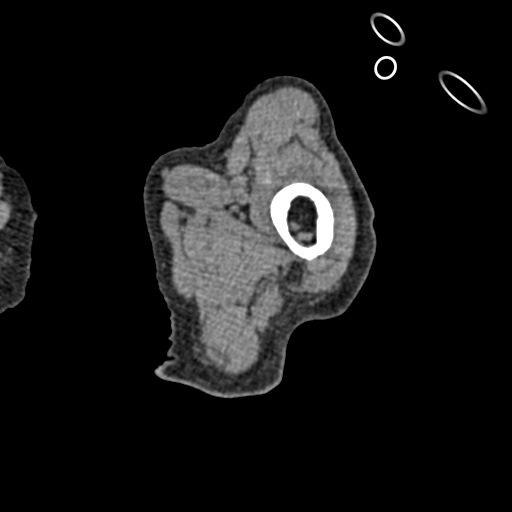
[im 38/130  soft-tissue]
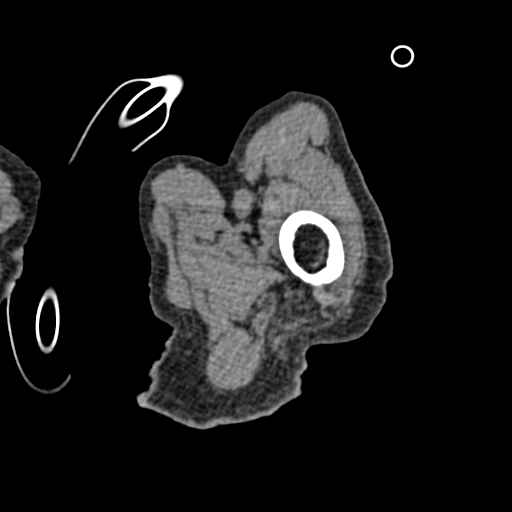
[im 46/130  soft-tissue]
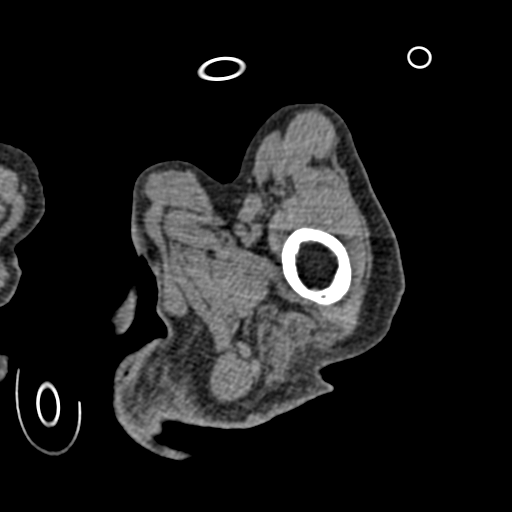
[im 55/130  soft-tissue]
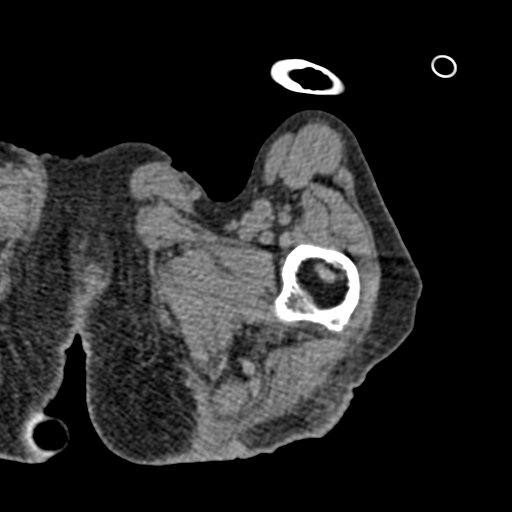
[im 67/130  soft-tissue]
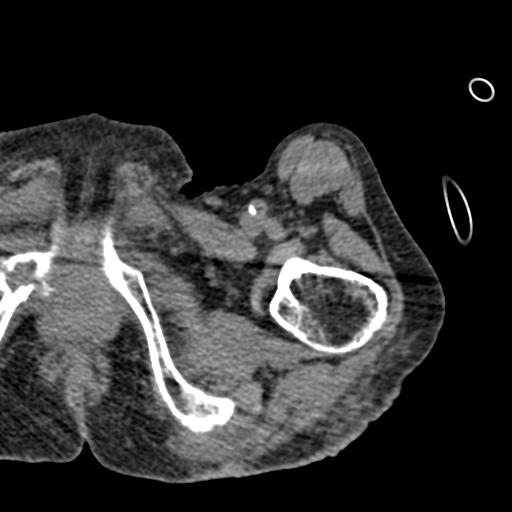
[im 75/130  soft-tissue]
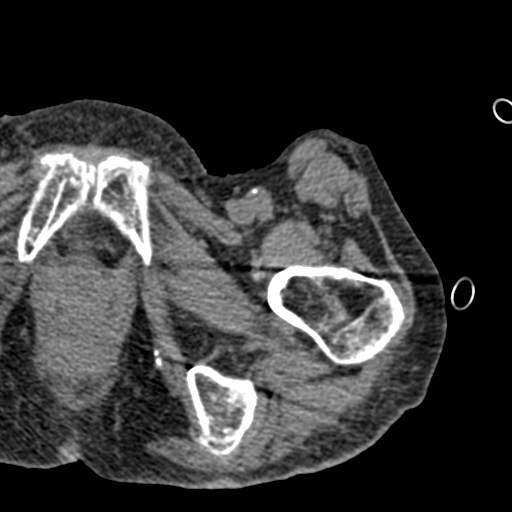
[im 84/130  soft-tissue]
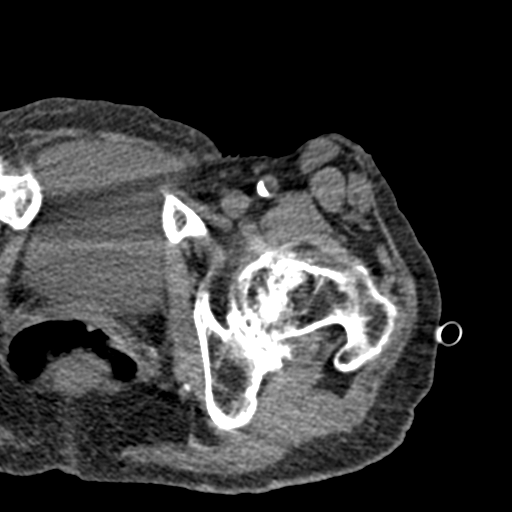
[im 84/130  bone]
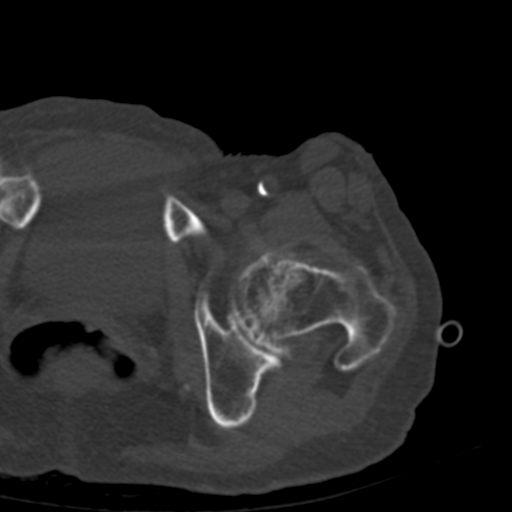
[im 92/130  soft-tissue]
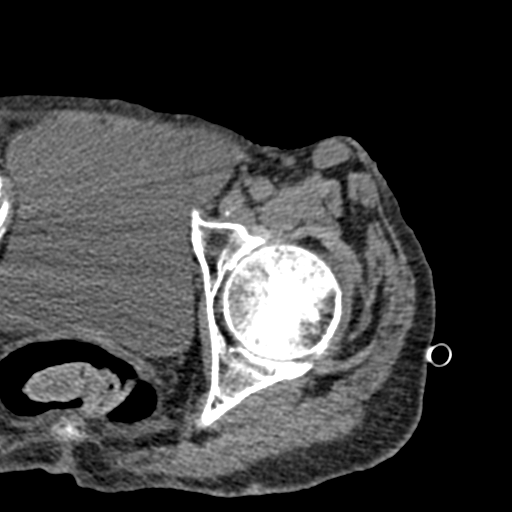
[im 105/130  soft-tissue]
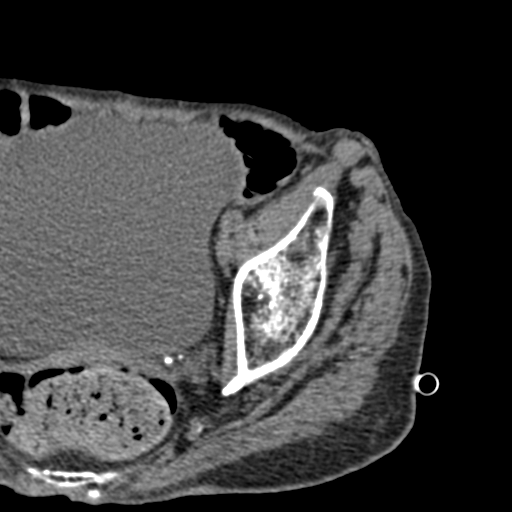
[im 113/130  soft-tissue]
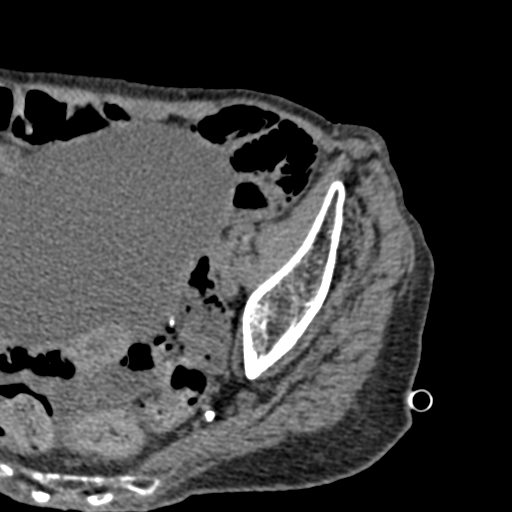
[im 121/130  soft-tissue]
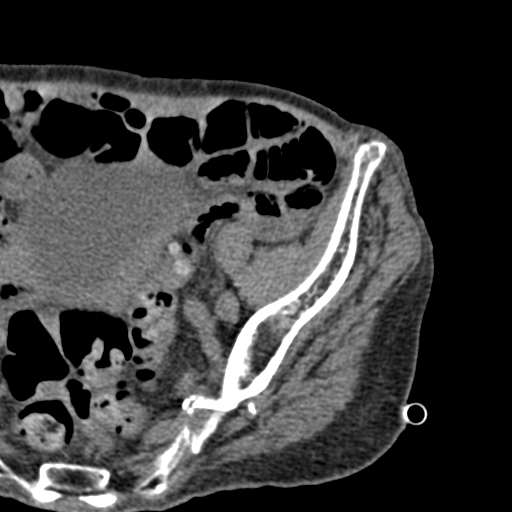

[Series 8: coronal st · coronal · 0.39mm/px · 3 of 86 slices shown]
[im 29/86  soft-tissue]
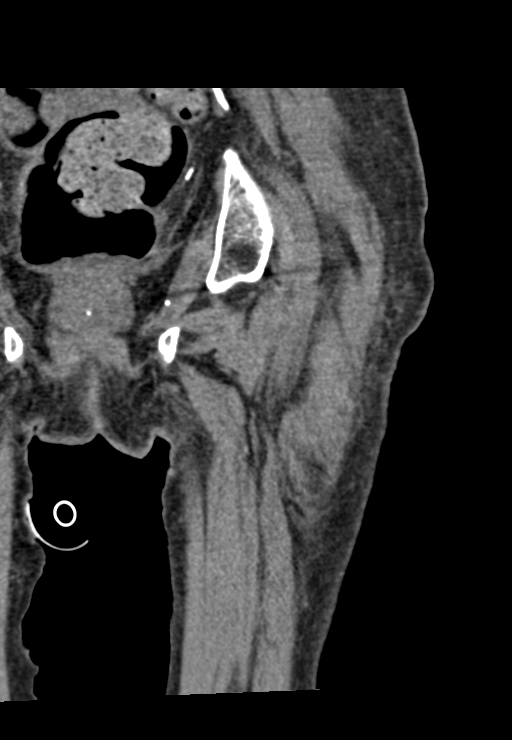
[im 38/86  soft-tissue]
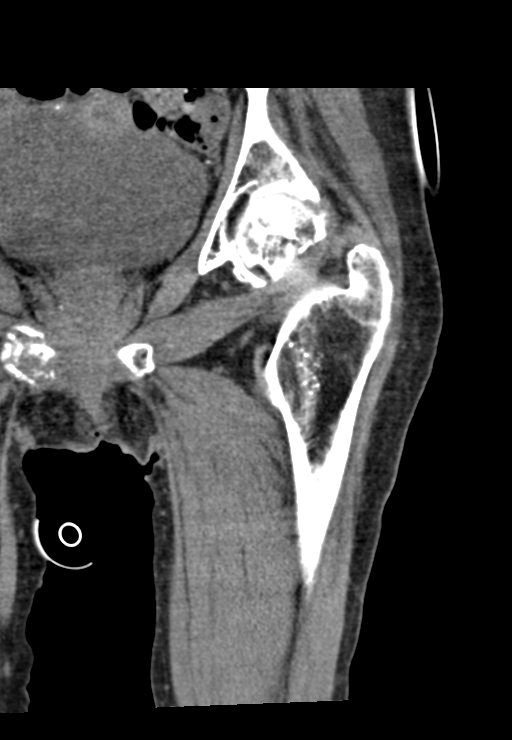
[im 48/86  soft-tissue]
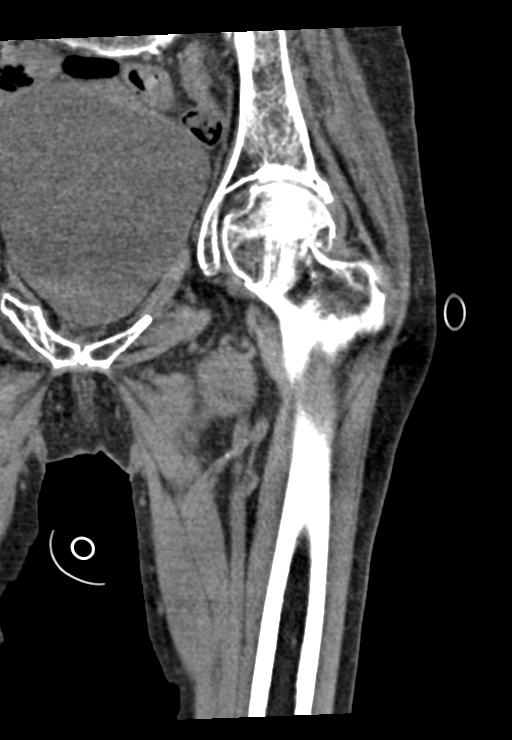

[16 of 46 positions shown; findings below may reference images not displayed]

FINDINGS: Bones/Joint/Cartilage

Acute minimally impacted subcapital fracture of the left femoral
neck. No intra-articular or intertrochanteric fracture component. No
significant displacement or angulation. Acute to subacute appearing
fracture of the right inferior pubic ramus at site of prior healed
fracture (series 2, image 63). Mild degenerative changes of the left
hip joint without dislocation. Pubic symphysis is intact with
chondrocalcinosis.

Ligaments

Suboptimally assessed by CT.

Muscles and Tendons

Atrophy and fatty infiltration of the included musculature. No gross
tendinous abnormality.

Soft tissues

Induration of the subcutaneous soft tissues posterior to the left
hip. No focal hematoma or fluid collection. Moderate distention of
the urinary bladder. Moderate volume of stool within the visualized
colon.
IMPRESSION: 1. Acute, minimally impacted subcapital fracture of the left femoral
neck.
2. Acute on chronic appearing right inferior pubic ramus fracture.

## 2019-10-28 IMAGING — CT CT HEAD W/O CM
3 series · 14 of 47 positions shown, 16 images · non-contrast
Comparison: 09/14/2016 CT head

CLINICAL DATA: Per EMS, pt from [REDACTED].for
fall last night. Pt has bruising and swelling to left shoulder,
abrasions to left knee. Pt laid on floor from 1AM to 9AM. Pt denies
loss of consciousness, did not hit head, is not on blood thinners.

EXAM:
CT HEAD WITHOUT CONTRAST
CT CERVICAL SPINE WITHOUT CONTRAST
TECHNIQUE: Multidetector CT imaging of the head and cervical spine was
performed following the standard protocol without intravenous
contrast. Multiplanar CT image reconstructions of the cervical spine
were also generated.

[Series 3: head wo · axial · 0.47mm/px · z∈[-157,-32]mm · 8 of 31 slices shown, 10 images]
[im 3/31  brain]
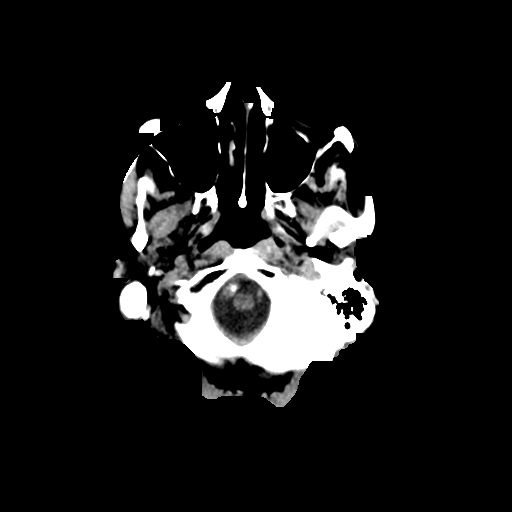
[im 3/31  bone]
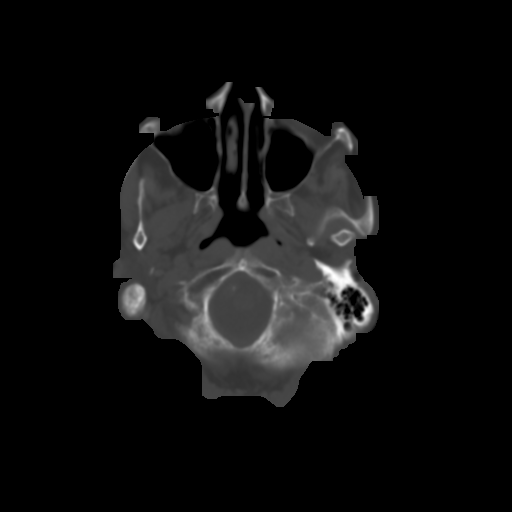
[im 7/31  brain]
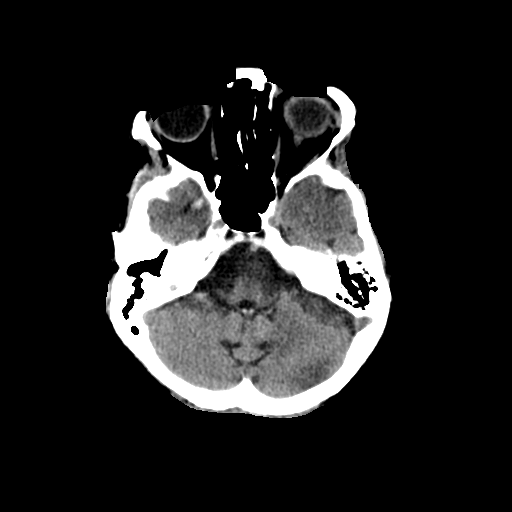
[im 10/31  brain]
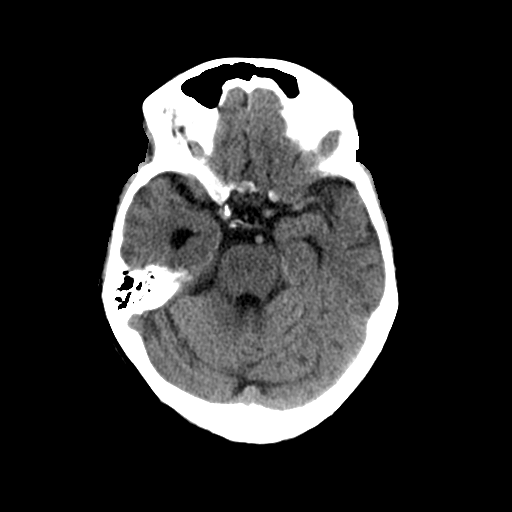
[im 14/31  brain]
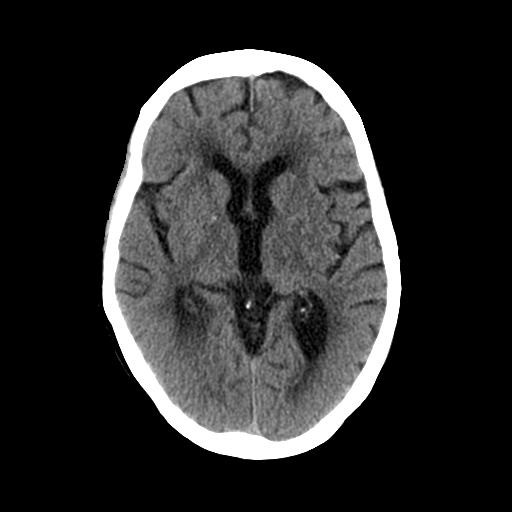
[im 17/31  brain]
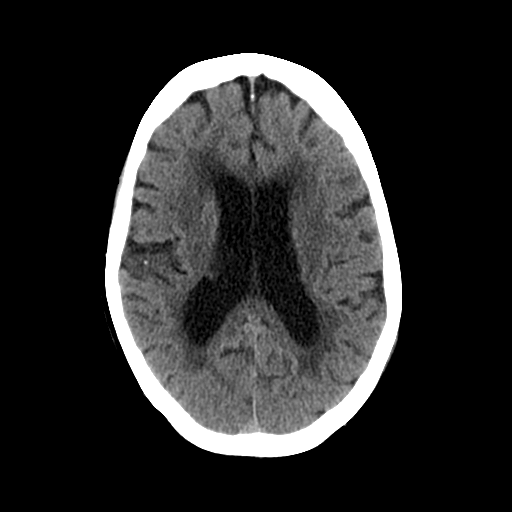
[im 17/31  bone]
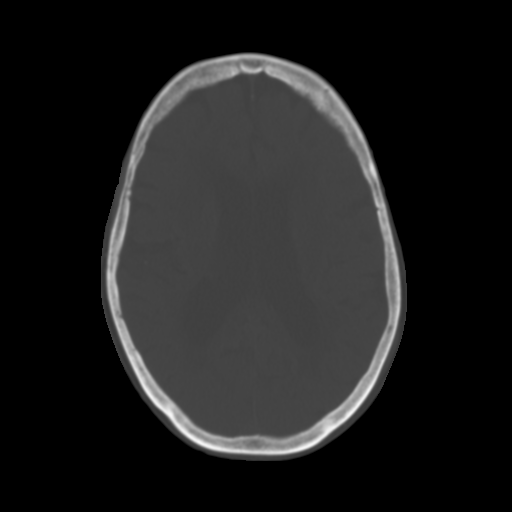
[im 21/31  brain]
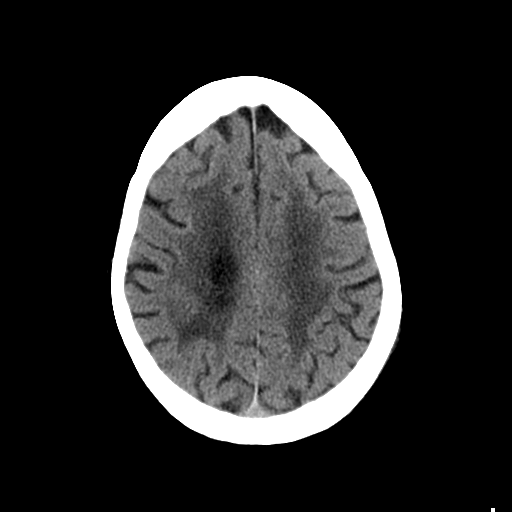
[im 24/31  brain]
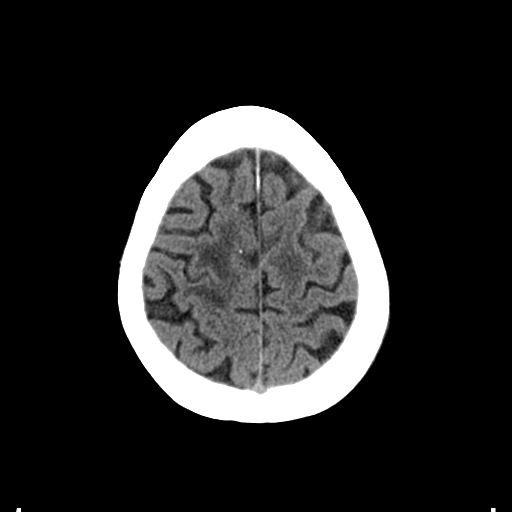
[im 28/31  brain]
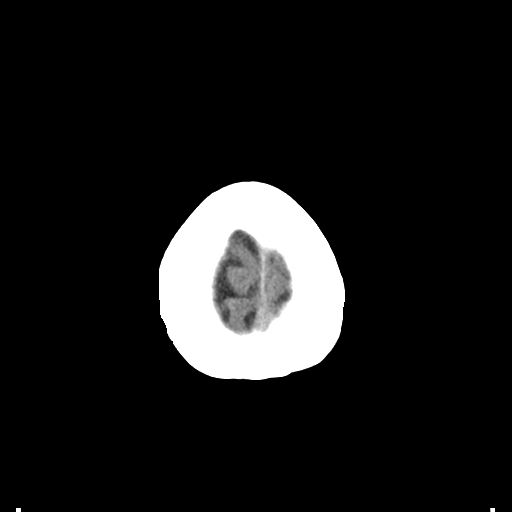

[Series 6: coronal soft tissue · coronal · 0.30mm/px · 3 of 63 slices shown]
[im 21/63  brain]
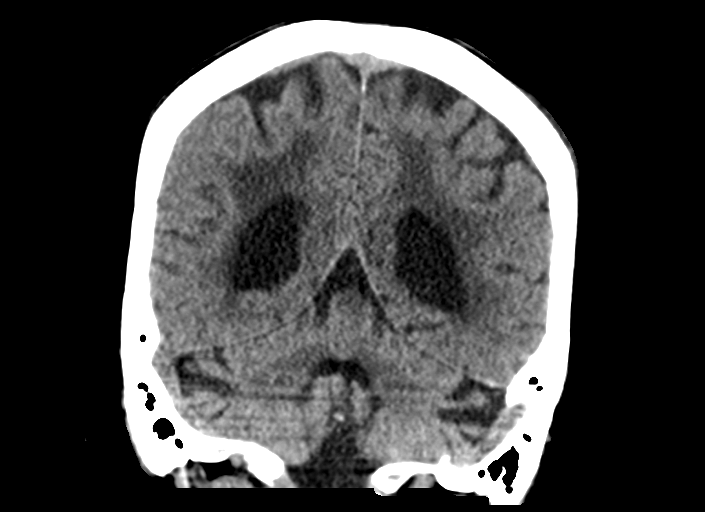
[im 28/63  brain]
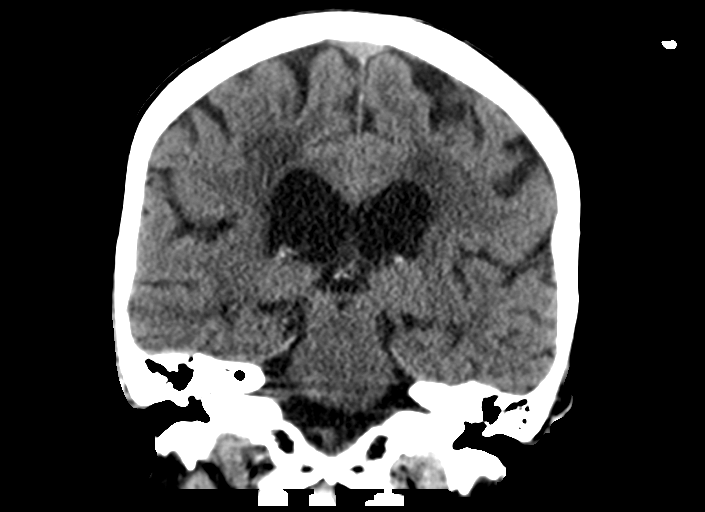
[im 35/63  brain]
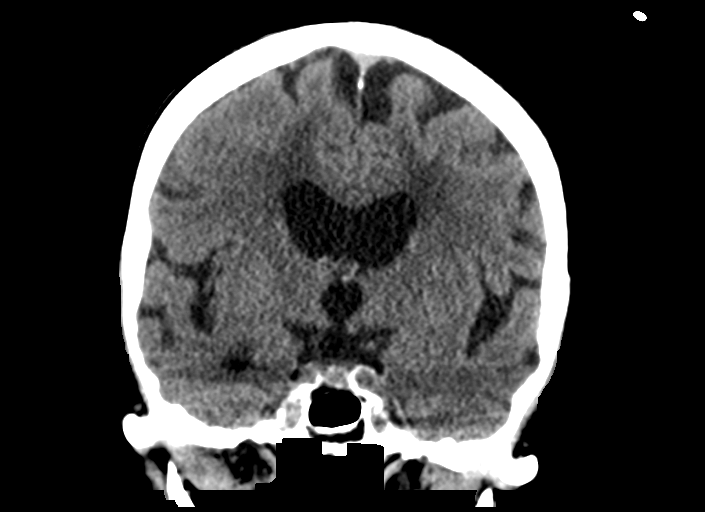

[Series 7: sagittal soft tissue · sagittal · 0.35mm/px · 3 of 48 slices shown]
[im 16/48  brain]
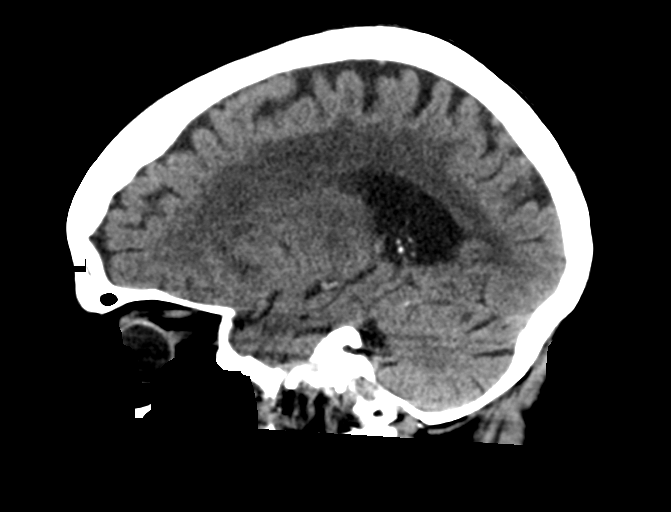
[im 24/48  brain]
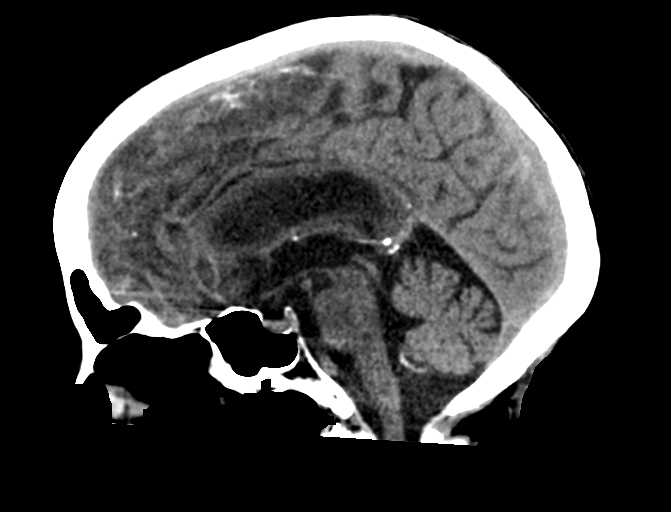
[im 32/48  brain]
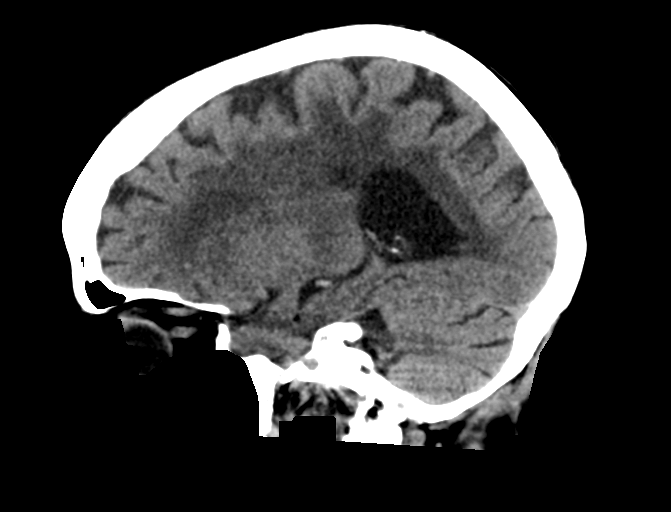

[14 of 47 positions shown; findings below may reference images not displayed]

FINDINGS: CT HEAD FINDINGS

Brain: There is central and cortical atrophy. Periventricular white
matter changes are consistent with small vessel disease. There is no
intra or extra-axial fluid collection or mass lesion. The basilar
cisterns and ventricles have a normal appearance. There is no CT
evidence for acute infarction or hemorrhage.

Vascular: There is minimal atherosclerotic calcification of the
internal carotid arteries. No hyperdense vessels.

Skull: No calvarial fracture. LEFT parietal scalp edema/hematoma.

Sinuses/Orbits: Minimal mucosal thickening of the LEFT maxillary
sinus. No acute fracture.

Other: None

CT CERVICAL SPINE FINDINGS

Alignment: There is loss of cervical lordosis. This may be secondary
to splinting, soft tissue injury, or positioning. Otherwise
alignment is normal.

Skull base and vertebrae: No acute fracture. No primary bone lesion
or focal pathologic process.

Soft tissues and spinal canal: No prevertebral fluid or swelling. No
visible canal hematoma.

Disc levels: Disc height loss particularly at C5-6, C6-7, and C7-T1.

Upper chest: There is biapical pleuroparenchymal change.

Acute fractures of posterior LEFT ribs 1-4. Acute fracture of the
LEFT clavicle. No pneumothorax at the apex.

Other: None
IMPRESSION: 1. Atrophy and small vessel disease.
2. No evidence for acute intracranial abnormality.
3. LEFT parietal scalp edema/hematoma.  No underlying fracture.
4. No evidence for acute cervical spine abnormality.
5. Acute fractures of the posterior LEFT ribs 1-4. Consider CT of
the chest with intravenous contrast.
6. Acute fracture of the LEFT clavicle.

These results were called by telephone at the time of interpretation
on 05/28/2019 at [DATE] to provider CHRISTINA ILLESCAS , who verbally
acknowledged these results.
